# Patient Record
Sex: Male | Born: 1935 | Race: White | Hispanic: No | Marital: Married | Smoking: Former smoker
Health system: Southern US, Community
[De-identification: ages and names within clinical notes are randomized; demographics above are authoritative.]

## PROBLEM LIST (undated history)

## (undated) DIAGNOSIS — I2510 Atherosclerotic heart disease of native coronary artery without angina pectoris: Secondary | ICD-10-CM

## (undated) DIAGNOSIS — K649 Unspecified hemorrhoids: Secondary | ICD-10-CM

## (undated) DIAGNOSIS — Z789 Other specified health status: Secondary | ICD-10-CM

## (undated) DIAGNOSIS — I509 Heart failure, unspecified: Secondary | ICD-10-CM

## (undated) DIAGNOSIS — E785 Hyperlipidemia, unspecified: Secondary | ICD-10-CM

## (undated) DIAGNOSIS — H9193 Unspecified hearing loss, bilateral: Secondary | ICD-10-CM

## (undated) DIAGNOSIS — I10 Essential (primary) hypertension: Secondary | ICD-10-CM

## (undated) DIAGNOSIS — R0602 Shortness of breath: Secondary | ICD-10-CM

## (undated) DIAGNOSIS — N200 Calculus of kidney: Secondary | ICD-10-CM

## (undated) DIAGNOSIS — J449 Chronic obstructive pulmonary disease, unspecified: Secondary | ICD-10-CM

## (undated) HISTORY — DX: Chronic obstructive pulmonary disease, unspecified: J44.9

## (undated) HISTORY — PX: TONSILLECTOMY: SUR1361

## (undated) HISTORY — DX: Unspecified hemorrhoids: K64.9

## (undated) HISTORY — DX: Calculus of kidney: N20.0

## (undated) HISTORY — DX: Unspecified hearing loss, bilateral: H91.93

## (undated) HISTORY — DX: Essential (primary) hypertension: I10

## (undated) HISTORY — DX: Atherosclerotic heart disease of native coronary artery without angina pectoris: I25.10

## (undated) HISTORY — PX: CHOLECYSTECTOMY: SHX55

## (undated) HISTORY — DX: Shortness of breath: R06.02

## (undated) HISTORY — DX: Other specified health status: Z78.9

## (undated) HISTORY — DX: Hyperlipidemia, unspecified: E78.5

---

## 2007-03-09 ENCOUNTER — Ambulatory Visit: Payer: Self-pay | Admitting: Cardiology

## 2010-11-25 ENCOUNTER — Ambulatory Visit: Payer: Self-pay | Admitting: Urology

## 2010-11-25 IMAGING — CT CT ABD-PELV W/O CM
1 of 2 series · 14 of 32 positions shown, 18 images · non-contrast
Comparison: none

REASON FOR EXAM: hematuria
COMMENTS:

[Series 2: soft tissue · axial · 0.80mm/px · z∈[-504,-150]mm · 14 of 135 slices shown, 18 images]
[im 11/135  soft-tissue]
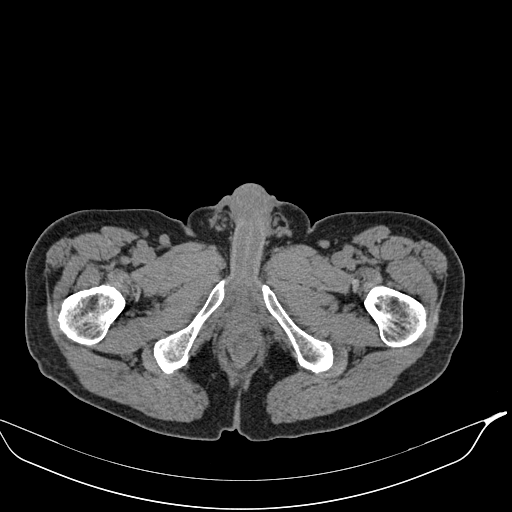
[im 11/135  bone]
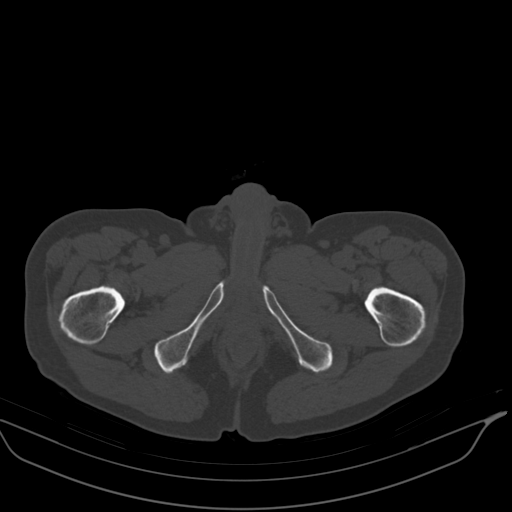
[im 22/135  soft-tissue]
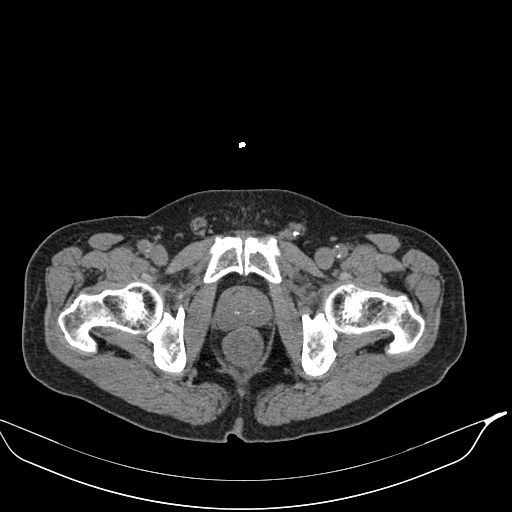
[im 33/135  soft-tissue]
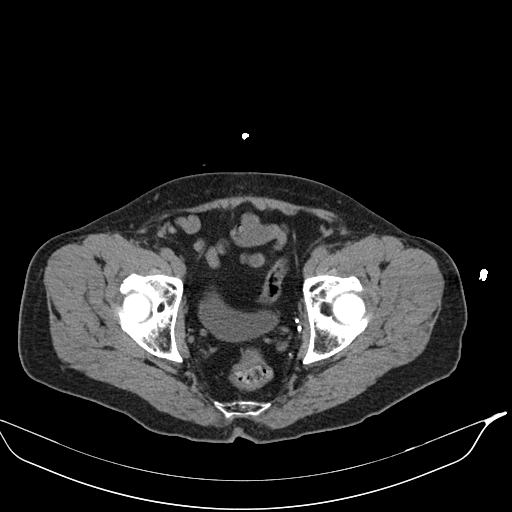
[im 43/135  soft-tissue]
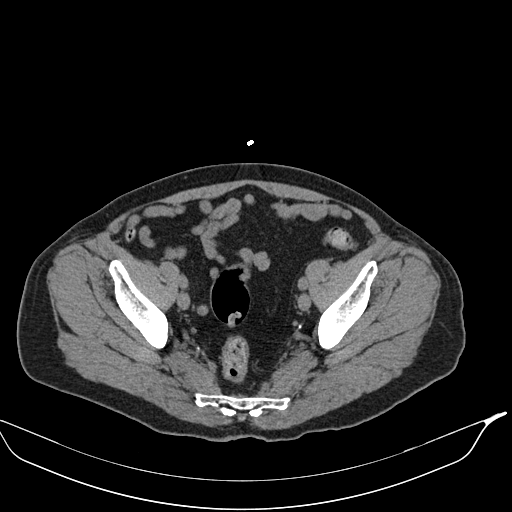
[im 54/135  soft-tissue]
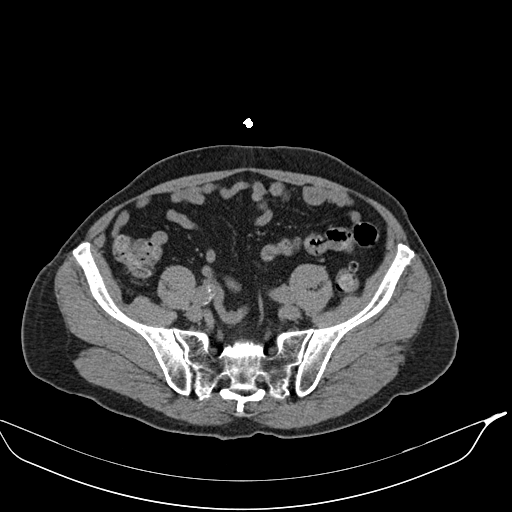
[im 65/135  soft-tissue]
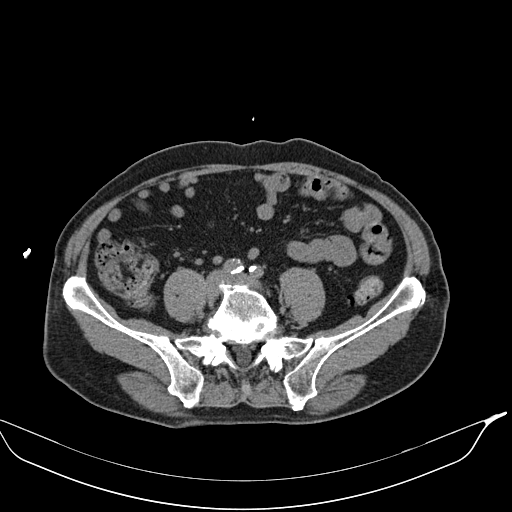
[im 76/135  soft-tissue]
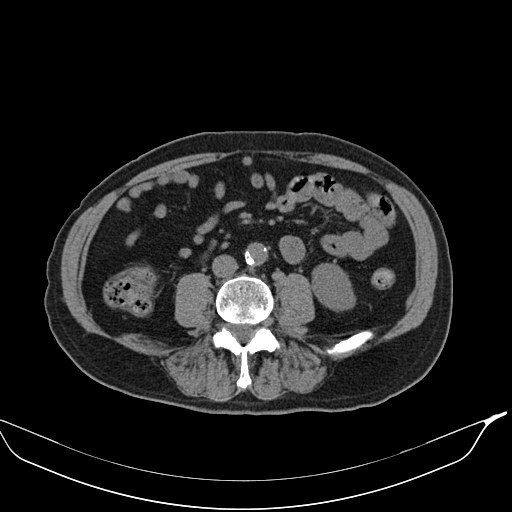
[im 86/135  soft-tissue]
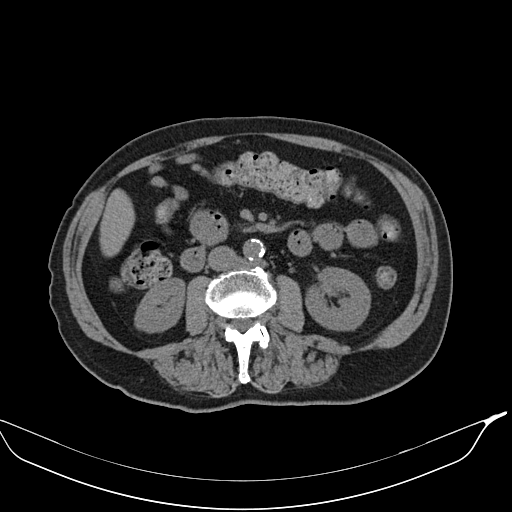
[im 97/135  soft-tissue]
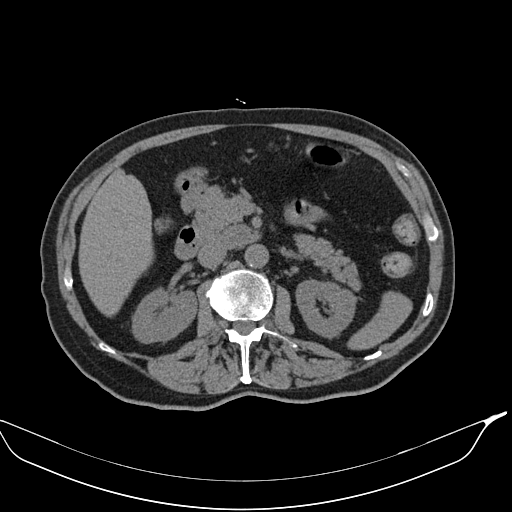
[im 97/135  bone]
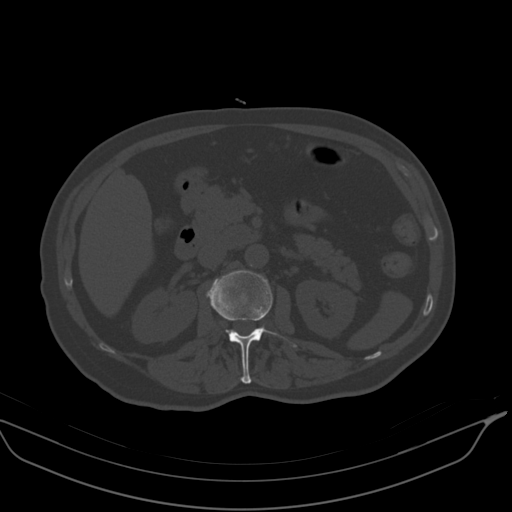
[im 108/135  soft-tissue]
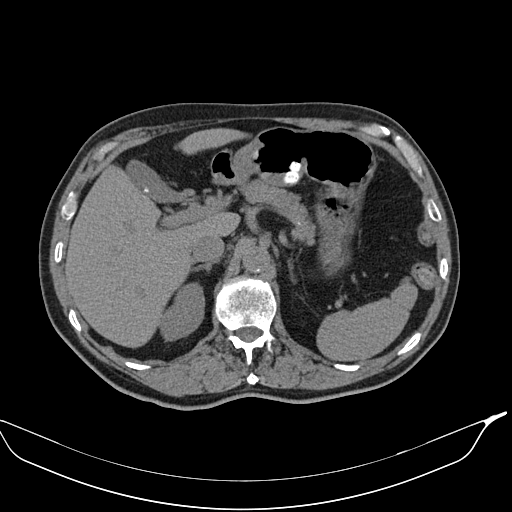
[im 113/135  lung]
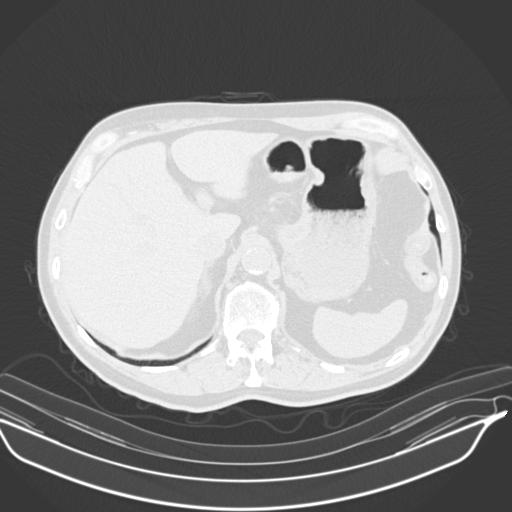
[im 118/135  soft-tissue]
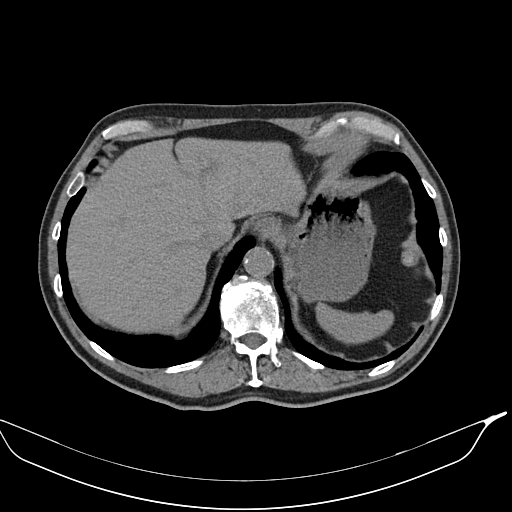
[im 118/135  lung]
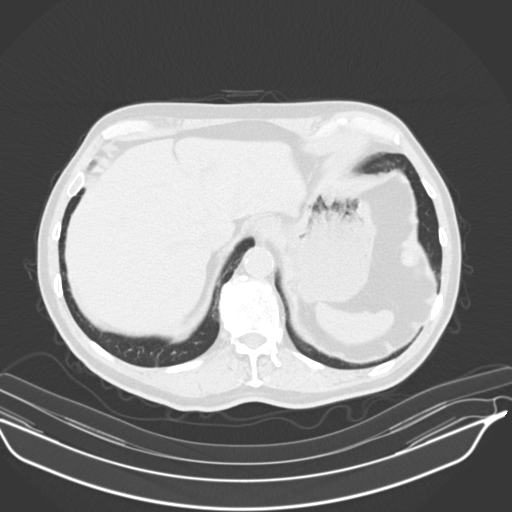
[im 124/135  lung]
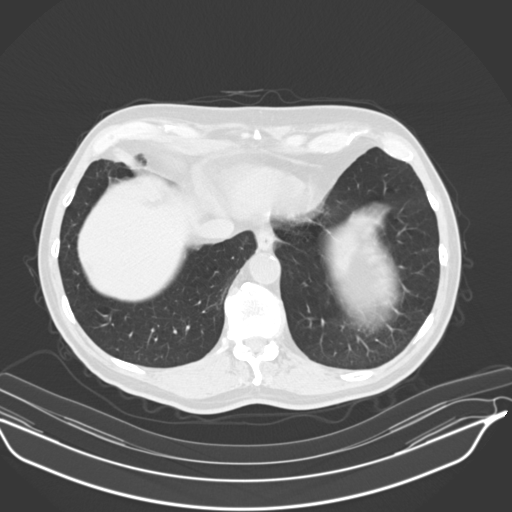
[im 129/135  soft-tissue]
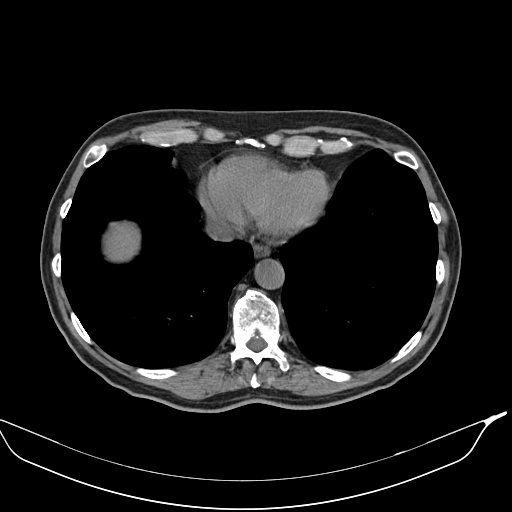
[im 129/135  lung]
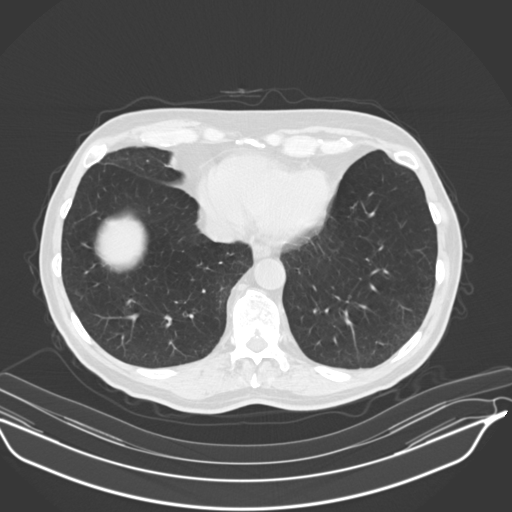

[14 of 32 positions shown; findings below may reference images not displayed]

PROCEDURE:     FALLON - FALLON ABDOMEN AND PELVIS WO  - [DATE] [DATE]

RESULT:     Axial noncontrast CT scanning was performed through the abdomen
and pelvis at 3 mm intervals and slice thicknesses. Review of multiplanar
reconstructed images was performed separately on the VIA monitor.

The kidneys are normal in density and contour. There are no calcified stones
nor is there any evidence of obstruction. The perinephric fat is normal in
density. Along the expected course of the ureters I do not see abnormal
calcifications. The partially distended urinary bladder is normal in
appearance. The prostate gland is mildly enlarged. The seminal vesicles
exhibit no acute abnormality. There are phleboliths within the pelvis. I see
no pelvic nor inguinal lymphadenopathy. There are small bilateral inguinal
hernias containing only fat.

The unopacified loops of small and large bowel exhibit no evidence of
obstruction nor ileus nor acute inflammation. There are a few scattered
diverticula especially in the descending colon. The gallbladder exhibits
multiple calcified stones layering in the dependent portion. The liver,
spleen, pancreas, partially distended stomach, and adrenal glands are normal
in appearance. There is a retroaortic left renal vein. The caliber of the
abdominal aorta is normal. The lung bases exhibit emphysematous changes. The
lumbar vertebral bodies are preserved in height. There are mild degenerative
changes of the disc in the lower thoracic and lumbar spine.
IMPRESSION: 1. I do not see evidence of calcified urinary tract stones nor evidence of
obstruction, masslike lesions, or perinephric inflammation. I see no
abnormalities along the expected course of the ureters nor abnormalities
associated with the urinary bladder.
2. The prostate gland is mildly enlarged and produces a mild impression upon
the urinary bladder. No acute abnormality the prostate or seminal vesicles
is seen.
3. There are small bilateral fat-containing inguinal hernias.
4. There are gallstones.
5. There are a few scattered diverticula within the colon but I see no
evidence of acute diverticulitis. The appendix is normal in appearance.

## 2011-05-15 ENCOUNTER — Emergency Department: Payer: Self-pay | Admitting: Internal Medicine

## 2011-05-15 IMAGING — CT CT STONE STUDY
1 of 2 series · 15 of 32 positions shown, 19 images · non-contrast
Comparison: none

REASON FOR EXAM: R flank, back pain
COMMENTS:

[Series 2: stone · axial · 0.73mm/px · z∈[-843,-483]mm · 15 of 137 slices shown, 19 images]
[im 11/137  soft-tissue]
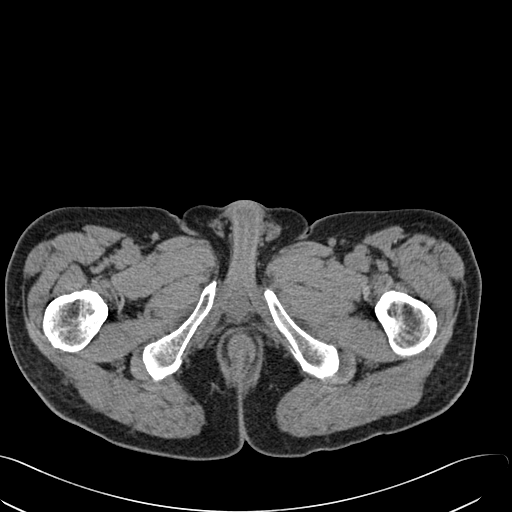
[im 11/137  bone]
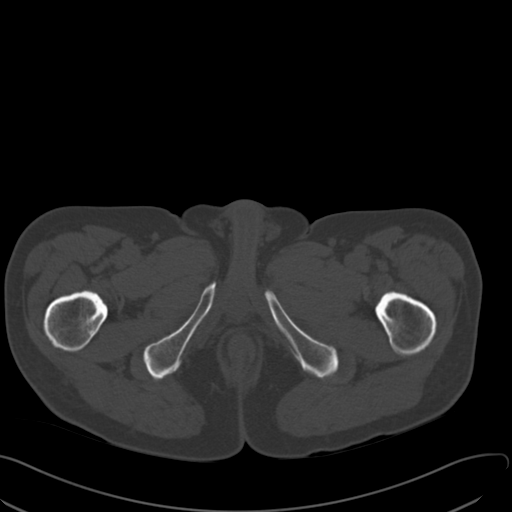
[im 21/137  soft-tissue]
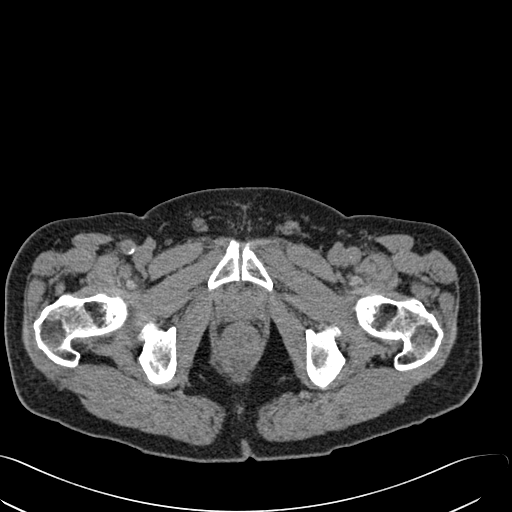
[im 31/137  soft-tissue]
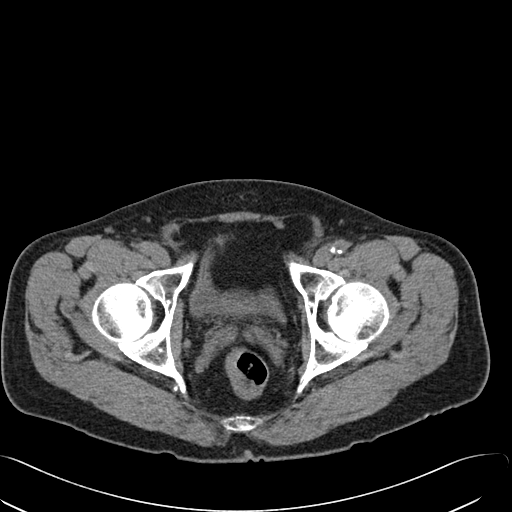
[im 41/137  soft-tissue]
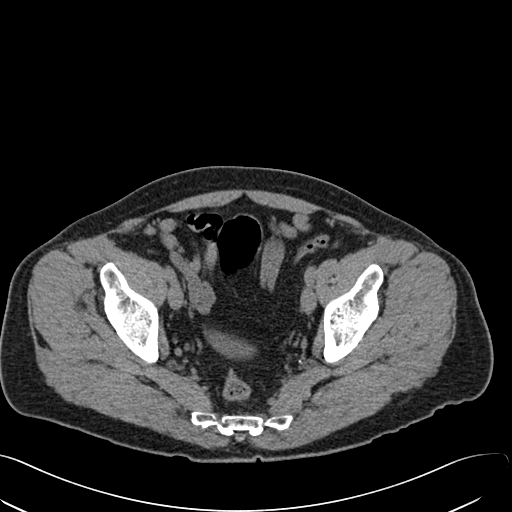
[im 51/137  soft-tissue]
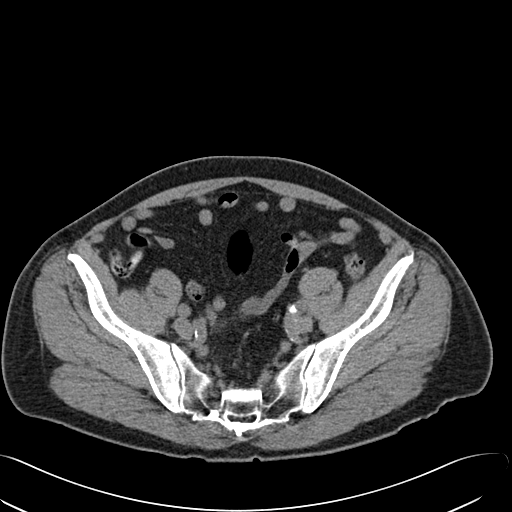
[im 61/137  soft-tissue]
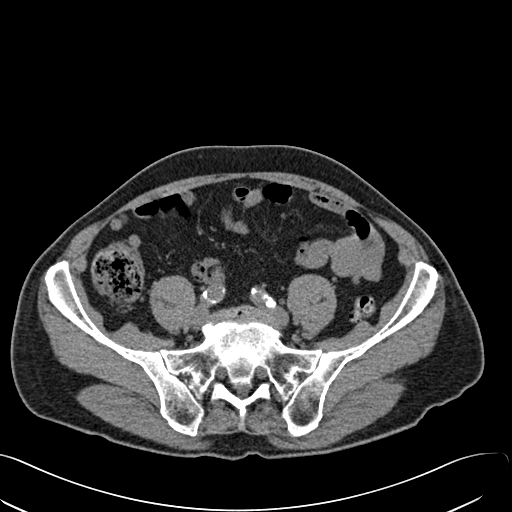
[im 71/137  soft-tissue]
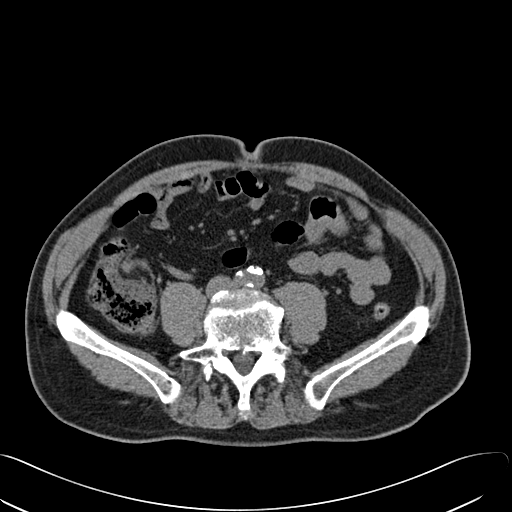
[im 81/137  soft-tissue]
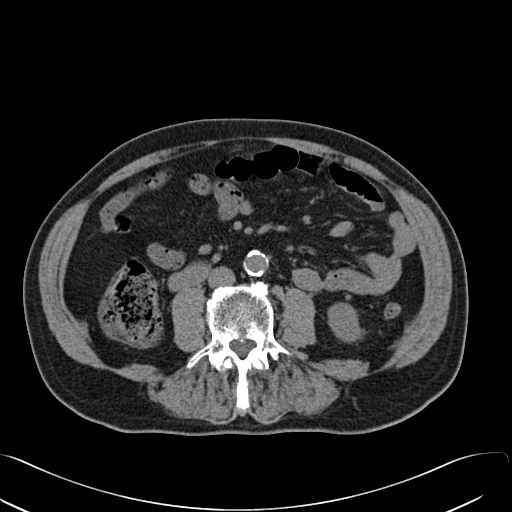
[im 91/137  soft-tissue]
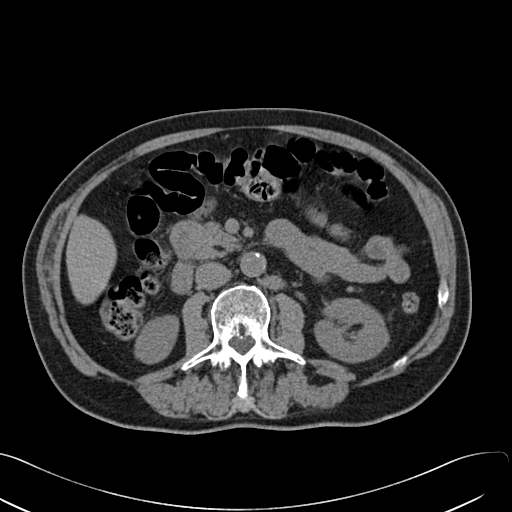
[im 91/137  bone]
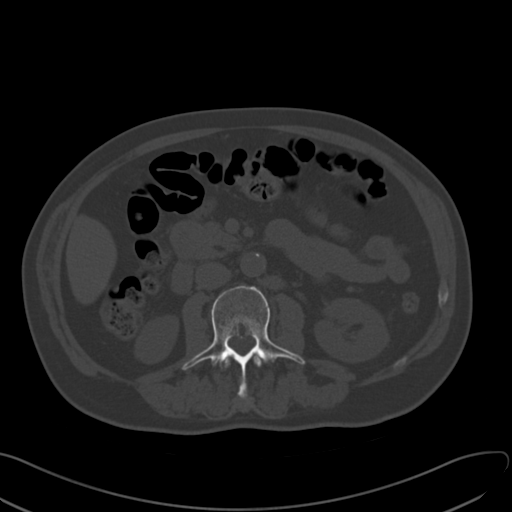
[im 101/137  soft-tissue]
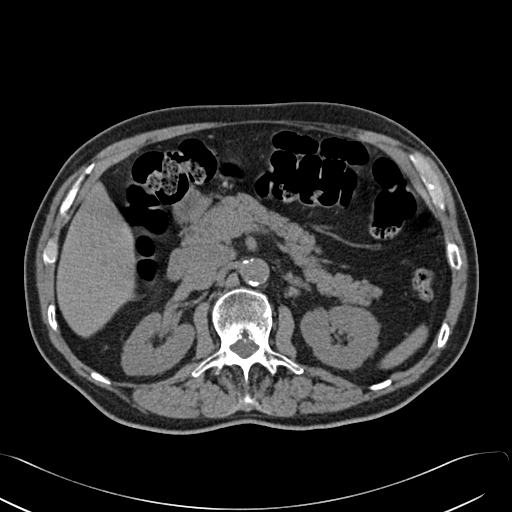
[im 111/137  soft-tissue]
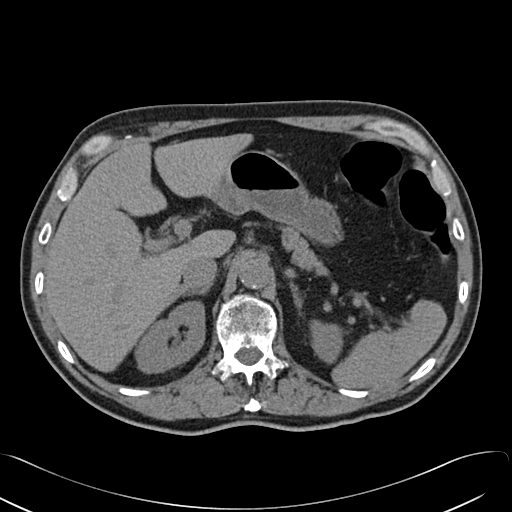
[im 116/137  lung]
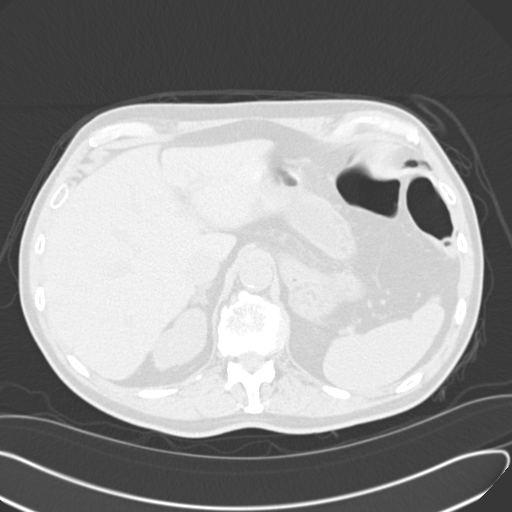
[im 121/137  soft-tissue]
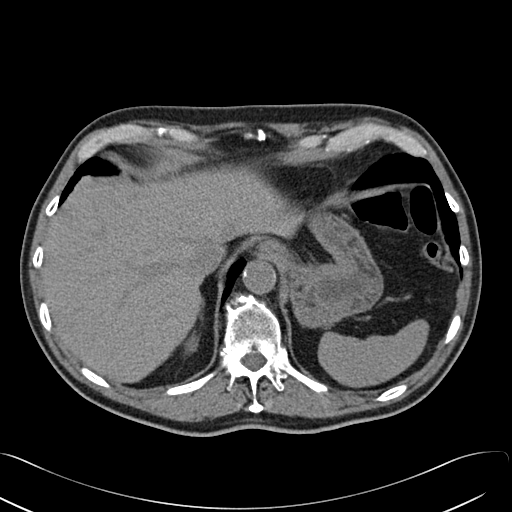
[im 121/137  lung]
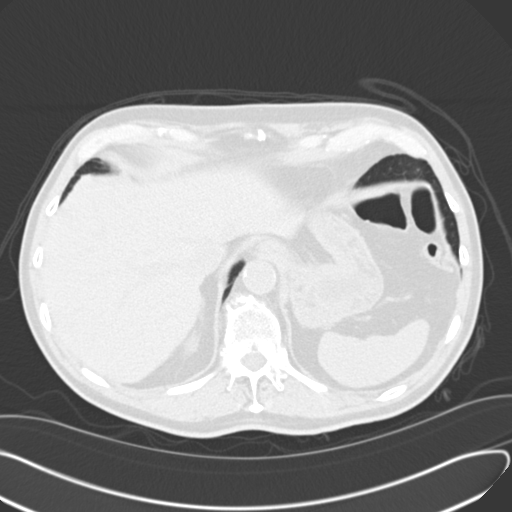
[im 126/137  lung]
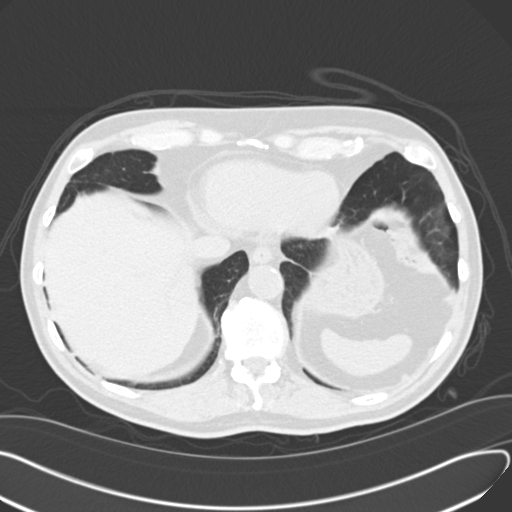
[im 131/137  soft-tissue]
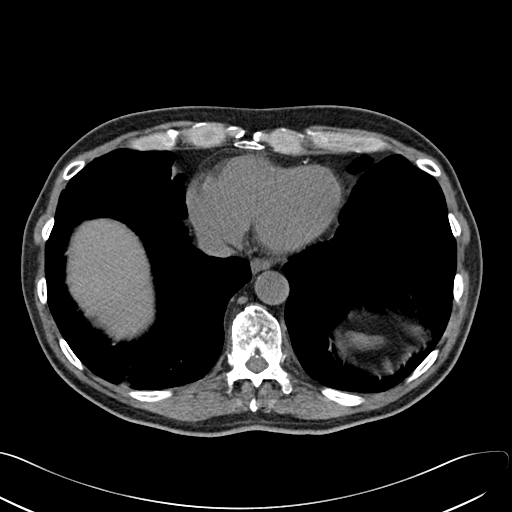
[im 131/137  lung]
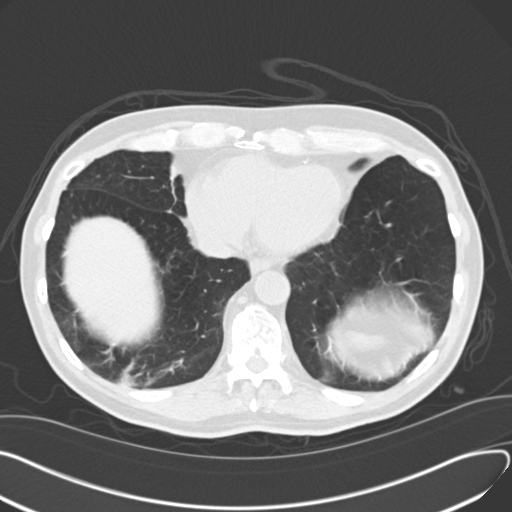

[15 of 32 positions shown; findings below may reference images not displayed]

PROCEDURE:     CT  - CT ABDOMEN /PELVIS WO (STONE)  - [DATE] [DATE]

RESULT:     Emergent noncontrast CT of the abdomen and pelvis is performed.
Comparison is made to the previous study of [DATE]. Images are
reconstructed at 3.0 mm slice thickness in the axial plane. Multislice
helical acquisition is performed utilizing the [employer] Via software for thin
slice reconstructions in the axial, coronal and sagittal planes.

No nephrolithiasis or hydronephrosis is evident area distal colonic
diverticulosis is seen especially in the distal descending colon.
Cholelithiasis is evident. Atherosclerotic calcification is demonstrated. No
nephrolithiasis is evident. Phleboliths are present within the pelvis. The
prostate is mildly enlarged but unchanged. The urinary bladder is
decompressed. The appendix is normal in appearance. The abdominal wall
appears intact. The spleen, liver, adrenal glands and pancreas appear
unremarkable. The lung bases demonstrate emphysematous disease with minimal
fibrosis or atelectasis. No mass or infiltrate is evident.
IMPRESSION: 1. Cholelithiasis. No definite CT evidence of acute cholecystitis.
2. Normal-appearing appendix.
3. No evidence of hydronephrosis, hydroureter or nephrolithiasis. No acute
inflammation. Diverticulosis is present. There is no abnormal bowel
distention or wall thickening.

## 2011-06-26 ENCOUNTER — Ambulatory Visit: Payer: Self-pay | Admitting: Surgery

## 2012-07-08 ENCOUNTER — Ambulatory Visit: Payer: Self-pay | Admitting: Unknown Physician Specialty

## 2012-08-17 ENCOUNTER — Ambulatory Visit: Payer: Self-pay | Admitting: Ophthalmology

## 2012-09-29 ENCOUNTER — Ambulatory Visit: Payer: Self-pay | Admitting: Ophthalmology

## 2014-02-24 DIAGNOSIS — K649 Unspecified hemorrhoids: Secondary | ICD-10-CM

## 2014-02-24 DIAGNOSIS — I10 Essential (primary) hypertension: Secondary | ICD-10-CM

## 2014-02-24 DIAGNOSIS — E785 Hyperlipidemia, unspecified: Secondary | ICD-10-CM

## 2014-02-24 DIAGNOSIS — I2510 Atherosclerotic heart disease of native coronary artery without angina pectoris: Secondary | ICD-10-CM

## 2014-02-24 HISTORY — DX: Atherosclerotic heart disease of native coronary artery without angina pectoris: I25.10

## 2014-02-24 HISTORY — DX: Hyperlipidemia, unspecified: E78.5

## 2014-02-24 HISTORY — DX: Essential (primary) hypertension: I10

## 2014-02-24 HISTORY — DX: Unspecified hemorrhoids: K64.9

## 2015-03-27 DIAGNOSIS — R0602 Shortness of breath: Secondary | ICD-10-CM

## 2015-03-27 HISTORY — DX: Shortness of breath: R06.02

## 2015-03-30 DIAGNOSIS — Z789 Other specified health status: Secondary | ICD-10-CM

## 2015-03-30 HISTORY — DX: Other specified health status: Z78.9

## 2015-08-29 DIAGNOSIS — J449 Chronic obstructive pulmonary disease, unspecified: Secondary | ICD-10-CM

## 2015-08-29 HISTORY — DX: Chronic obstructive pulmonary disease, unspecified: J44.9

## 2015-09-25 DIAGNOSIS — I10 Essential (primary) hypertension: Secondary | ICD-10-CM | POA: Diagnosis not present

## 2015-09-25 DIAGNOSIS — Z789 Other specified health status: Secondary | ICD-10-CM | POA: Diagnosis not present

## 2015-09-25 DIAGNOSIS — I2510 Atherosclerotic heart disease of native coronary artery without angina pectoris: Secondary | ICD-10-CM | POA: Diagnosis not present

## 2015-09-25 DIAGNOSIS — R0602 Shortness of breath: Secondary | ICD-10-CM | POA: Diagnosis not present

## 2015-09-25 DIAGNOSIS — E7800 Pure hypercholesterolemia, unspecified: Secondary | ICD-10-CM | POA: Diagnosis not present

## 2015-09-25 DIAGNOSIS — J449 Chronic obstructive pulmonary disease, unspecified: Secondary | ICD-10-CM | POA: Diagnosis not present

## 2015-11-02 DIAGNOSIS — J310 Chronic rhinitis: Secondary | ICD-10-CM | POA: Diagnosis not present

## 2015-11-02 DIAGNOSIS — H903 Sensorineural hearing loss, bilateral: Secondary | ICD-10-CM | POA: Diagnosis not present

## 2015-11-02 DIAGNOSIS — H6060 Unspecified chronic otitis externa, unspecified ear: Secondary | ICD-10-CM | POA: Diagnosis not present

## 2015-11-05 DIAGNOSIS — H26493 Other secondary cataract, bilateral: Secondary | ICD-10-CM | POA: Diagnosis not present

## 2015-11-29 DIAGNOSIS — H26491 Other secondary cataract, right eye: Secondary | ICD-10-CM | POA: Diagnosis not present

## 2015-12-13 DIAGNOSIS — H26492 Other secondary cataract, left eye: Secondary | ICD-10-CM | POA: Diagnosis not present

## 2016-01-01 DIAGNOSIS — J449 Chronic obstructive pulmonary disease, unspecified: Secondary | ICD-10-CM | POA: Diagnosis not present

## 2016-02-25 DIAGNOSIS — I10 Essential (primary) hypertension: Secondary | ICD-10-CM | POA: Diagnosis not present

## 2016-02-25 DIAGNOSIS — H9193 Unspecified hearing loss, bilateral: Secondary | ICD-10-CM | POA: Diagnosis not present

## 2016-02-25 DIAGNOSIS — I2510 Atherosclerotic heart disease of native coronary artery without angina pectoris: Secondary | ICD-10-CM | POA: Diagnosis not present

## 2016-02-25 DIAGNOSIS — E7800 Pure hypercholesterolemia, unspecified: Secondary | ICD-10-CM | POA: Diagnosis not present

## 2016-02-25 DIAGNOSIS — J449 Chronic obstructive pulmonary disease, unspecified: Secondary | ICD-10-CM | POA: Diagnosis not present

## 2016-03-03 DIAGNOSIS — J449 Chronic obstructive pulmonary disease, unspecified: Secondary | ICD-10-CM | POA: Diagnosis not present

## 2016-03-03 DIAGNOSIS — H918X3 Other specified hearing loss, bilateral: Secondary | ICD-10-CM | POA: Diagnosis not present

## 2016-03-03 DIAGNOSIS — I2510 Atherosclerotic heart disease of native coronary artery without angina pectoris: Secondary | ICD-10-CM | POA: Diagnosis not present

## 2016-03-03 DIAGNOSIS — Z0000 Encounter for general adult medical examination without abnormal findings: Secondary | ICD-10-CM | POA: Diagnosis not present

## 2016-03-03 DIAGNOSIS — E782 Mixed hyperlipidemia: Secondary | ICD-10-CM | POA: Diagnosis not present

## 2016-03-03 DIAGNOSIS — I10 Essential (primary) hypertension: Secondary | ICD-10-CM | POA: Diagnosis not present

## 2016-03-03 DIAGNOSIS — Z23 Encounter for immunization: Secondary | ICD-10-CM | POA: Diagnosis not present

## 2016-03-03 DIAGNOSIS — H9193 Unspecified hearing loss, bilateral: Secondary | ICD-10-CM

## 2016-03-03 HISTORY — DX: Unspecified hearing loss, bilateral: H91.93

## 2016-05-28 DIAGNOSIS — Z08 Encounter for follow-up examination after completed treatment for malignant neoplasm: Secondary | ICD-10-CM | POA: Diagnosis not present

## 2016-05-28 DIAGNOSIS — C44619 Basal cell carcinoma of skin of left upper limb, including shoulder: Secondary | ICD-10-CM | POA: Diagnosis not present

## 2016-05-28 DIAGNOSIS — L570 Actinic keratosis: Secondary | ICD-10-CM | POA: Diagnosis not present

## 2016-05-28 DIAGNOSIS — Z85828 Personal history of other malignant neoplasm of skin: Secondary | ICD-10-CM | POA: Diagnosis not present

## 2016-05-28 DIAGNOSIS — L821 Other seborrheic keratosis: Secondary | ICD-10-CM | POA: Diagnosis not present

## 2016-05-28 DIAGNOSIS — Z85820 Personal history of malignant melanoma of skin: Secondary | ICD-10-CM | POA: Diagnosis not present

## 2016-05-28 DIAGNOSIS — X32XXXA Exposure to sunlight, initial encounter: Secondary | ICD-10-CM | POA: Diagnosis not present

## 2016-05-28 DIAGNOSIS — D485 Neoplasm of uncertain behavior of skin: Secondary | ICD-10-CM | POA: Diagnosis not present

## 2016-07-09 DIAGNOSIS — C44619 Basal cell carcinoma of skin of left upper limb, including shoulder: Secondary | ICD-10-CM | POA: Diagnosis not present

## 2016-07-09 DIAGNOSIS — L905 Scar conditions and fibrosis of skin: Secondary | ICD-10-CM | POA: Diagnosis not present

## 2016-08-26 DIAGNOSIS — E782 Mixed hyperlipidemia: Secondary | ICD-10-CM | POA: Diagnosis not present

## 2016-08-26 DIAGNOSIS — I10 Essential (primary) hypertension: Secondary | ICD-10-CM | POA: Diagnosis not present

## 2016-08-26 DIAGNOSIS — J449 Chronic obstructive pulmonary disease, unspecified: Secondary | ICD-10-CM | POA: Diagnosis not present

## 2016-08-26 DIAGNOSIS — Z0000 Encounter for general adult medical examination without abnormal findings: Secondary | ICD-10-CM | POA: Diagnosis not present

## 2016-08-26 DIAGNOSIS — I2510 Atherosclerotic heart disease of native coronary artery without angina pectoris: Secondary | ICD-10-CM | POA: Diagnosis not present

## 2016-09-02 DIAGNOSIS — R6889 Other general symptoms and signs: Secondary | ICD-10-CM | POA: Diagnosis not present

## 2016-09-02 DIAGNOSIS — Z789 Other specified health status: Secondary | ICD-10-CM | POA: Diagnosis not present

## 2016-09-02 DIAGNOSIS — I10 Essential (primary) hypertension: Secondary | ICD-10-CM | POA: Diagnosis not present

## 2016-09-02 DIAGNOSIS — E7800 Pure hypercholesterolemia, unspecified: Secondary | ICD-10-CM | POA: Diagnosis not present

## 2016-09-02 DIAGNOSIS — J449 Chronic obstructive pulmonary disease, unspecified: Secondary | ICD-10-CM | POA: Diagnosis not present

## 2016-09-02 DIAGNOSIS — Z23 Encounter for immunization: Secondary | ICD-10-CM | POA: Diagnosis not present

## 2016-09-02 DIAGNOSIS — Z0000 Encounter for general adult medical examination without abnormal findings: Secondary | ICD-10-CM | POA: Diagnosis not present

## 2016-09-02 DIAGNOSIS — I2510 Atherosclerotic heart disease of native coronary artery without angina pectoris: Secondary | ICD-10-CM | POA: Diagnosis not present

## 2016-09-02 DIAGNOSIS — R0602 Shortness of breath: Secondary | ICD-10-CM | POA: Diagnosis not present

## 2016-10-21 DIAGNOSIS — X32XXXA Exposure to sunlight, initial encounter: Secondary | ICD-10-CM | POA: Diagnosis not present

## 2016-10-21 DIAGNOSIS — Z08 Encounter for follow-up examination after completed treatment for malignant neoplasm: Secondary | ICD-10-CM | POA: Diagnosis not present

## 2016-10-21 DIAGNOSIS — Z85828 Personal history of other malignant neoplasm of skin: Secondary | ICD-10-CM | POA: Diagnosis not present

## 2016-10-21 DIAGNOSIS — L570 Actinic keratosis: Secondary | ICD-10-CM | POA: Diagnosis not present

## 2016-10-21 DIAGNOSIS — C4441 Basal cell carcinoma of skin of scalp and neck: Secondary | ICD-10-CM | POA: Diagnosis not present

## 2016-10-21 DIAGNOSIS — D0439 Carcinoma in situ of skin of other parts of face: Secondary | ICD-10-CM | POA: Diagnosis not present

## 2016-10-21 DIAGNOSIS — D485 Neoplasm of uncertain behavior of skin: Secondary | ICD-10-CM | POA: Diagnosis not present

## 2016-10-21 DIAGNOSIS — Z85820 Personal history of malignant melanoma of skin: Secondary | ICD-10-CM | POA: Diagnosis not present

## 2016-11-03 DIAGNOSIS — R309 Painful micturition, unspecified: Secondary | ICD-10-CM | POA: Diagnosis not present

## 2016-11-10 ENCOUNTER — Ambulatory Visit: Payer: PPO | Admitting: Urology

## 2016-11-10 ENCOUNTER — Encounter: Payer: Self-pay | Admitting: Urology

## 2016-11-10 VITALS — BP 103/62 | HR 84 | Ht 66.0 in | Wt 143.0 lb

## 2016-11-10 DIAGNOSIS — R3129 Other microscopic hematuria: Secondary | ICD-10-CM

## 2016-11-10 LAB — URINALYSIS, COMPLETE
Bilirubin, UA: NEGATIVE
GLUCOSE, UA: NEGATIVE
KETONES UA: NEGATIVE
LEUKOCYTES UA: NEGATIVE
Nitrite, UA: NEGATIVE
Protein, UA: NEGATIVE
RBC, UA: NEGATIVE
SPEC GRAV UA: 1.02 (ref 1.005–1.030)
Urobilinogen, Ur: 0.2 mg/dL (ref 0.2–1.0)
pH, UA: 6.5 (ref 5.0–7.5)

## 2016-11-10 LAB — MICROSCOPIC EXAMINATION
BACTERIA UA: NONE SEEN
RBC MICROSCOPIC, UA: NONE SEEN /HPF (ref 0–?)

## 2016-11-10 LAB — BLADDER SCAN AMB NON-IMAGING

## 2016-11-10 NOTE — Progress Notes (Signed)
11/10/2016 9:25 AM   Eric Li [birth year] [id number]  Referring provider: [name], MD [address] [facility] [place], [place] [address]  Chief Complaint  Patient presents with  . Hematuria    New Patient    HPI: 82 year old male who presents today for further evaluation of microscopic hematuria. The patient states that over the last several weeks he had noticed increasing or worsening urinary frequency. He thus subsequently was followed by dysuria. The patient then was placed on antibiotics for 7 days and his symptoms have completely resolved at this point.  The patient no longer is complaining of any urinary frequency, urgency, or dysuria. He has not had any gross hematuria. He denies any pain.  The patient does have a history of kidney stones, has not passed the stone in quite some time. He was last treated by Dr. [name].     PMH: Past Medical History:  Diagnosis Date  . Bilateral hearing loss 03/03/2016  . CAD (coronary artery disease) 02/24/2014  . COPD, moderate ([place]) 08/29/2015  . Hemorrhoids 02/24/2014  . HTN (hypertension) 02/24/2014  . Hyperlipidemia, unspecified 02/24/2014  . Nephrolithiasis   . SOB (shortness of breath) on exertion 03/27/2015  . Statin intolerance 03/30/2015    Surgical History: Past Surgical History:  Procedure Laterality Date  . CHOLECYSTECTOMY    . TONSILLECTOMY      Home Medications:  Allergies as of 11/10/2016   No Known Allergies     Medication List       Accurate as of 11/10/16  9:25 AM. Always use your most recent med list.          albuterol 108 (90 Base) MCG/ACT inhaler Commonly known as:  PROVENTIL HFA;VENTOLIN HFA Inhale into the lungs.   aspirin EC 81 MG tablet Take by mouth.   ezetimibe 10 MG tablet Commonly known as:  ZETIA   metoprolol succinate 25 MG 24 hr tablet Commonly known as:  TOPROL-XL   SPIRIVA HANDIHALER 18 MCG inhalation capsule Generic drug:  tiotropium   zolpidem 10 MG  tablet Commonly known as:  AMBIEN       Allergies: No Known Allergies  Family History: Family History  Problem Relation Age of Onset  . Bladder Cancer Neg Hx   . Prolactinoma Neg Hx   . Prostate cancer Neg Hx   . Kidney cancer Neg Hx     Social History:  reports that he has quit smoking. He has never used smokeless tobacco. He reports that he does not drink alcohol or use drugs.  ROS: UROLOGY Frequent Urination?: No Hard to postpone urination?: No Burning/pain with urination?: No Get up at night to urinate?: No Leakage of urine?: No Urine stream starts and stops?: No Trouble starting stream?: No Do you have to strain to urinate?: No Blood in urine?: No Urinary tract infection?: No Sexually transmitted disease?: No Injury to kidneys or bladder?: No Painful intercourse?: No Weak stream?: Yes Erection problems?: No Penile pain?: No  Gastrointestinal Nausea?: No Vomiting?: No Indigestion/heartburn?: Yes Diarrhea?: Yes Constipation?: No  Constitutional Fever: No Night sweats?: Yes Weight loss?: No Fatigue?: No  Skin Skin rash/lesions?: Yes Itching?: Yes  Eyes Blurred vision?: No Double vision?: No  Ears/Nose/Throat Sore throat?: No Sinus problems?: Yes  Hematologic/Lymphatic Swollen glands?: No Easy bruising?: Yes  Cardiovascular Leg swelling?: No Chest pain?: No  Respiratory Cough?: No Shortness of breath?: No  Endocrine Excessive thirst?: No  Musculoskeletal Back pain?: No Joint pain?: No  Neurological Headaches?: Yes  Dizziness?: No  Psychologic Depression?: No Anxiety?: No  Physical Exam: BP 103/62   Pulse 84   Ht 5\' 6"  (1.676 m)   Wt 64.9 kg (143 lb)   BMI 23.08 kg/m   Constitutional:  Alert and oriented, No acute distress. HEENT: [place] AT, moist mucus membranes.  Trachea midline, no masses. Cardiovascular: No clubbing, cyanosis, or edema. Respiratory: Normal respiratory effort, no increased work of breathing. GI:  Abdomen is soft, nontender, nondistended, no abdominal masses GU: No CVA tenderness. Skin: No rashes, bruises or suspicious lesions. Lymph: No cervical or inguinal adenopathy. Neurologic: Grossly intact, no focal deficits, moving all 4 extremities. Psychiatric: Normal mood and affect.  Laboratory Data: No results found for: WBC, HGB, HCT, MCV, PLT  No results found for: CREATININE  No results found for: PSA  No results found for: TESTOSTERONE  No results found for: HGBA1C  Urinalysis No results found for: COLORURINE, APPEARANCEUR, LABSPEC, PHURINE, GLUCOSEU, HGBUR, BILIRUBINUR, KETONESUR, PROTEINUR, UROBILINOGEN, NITRITE, LEUKOCYTESUR  Pertinent Imaging: None reviewed today  Assessment & Plan:  A 82 year old male who was treated for urinary tract infection and at the time of initial diagnosis was noted to have microscopic hematuria. Since being treated his urine is cleared and his symptoms have resolved.  1. Microscopic hematuria The patient's microscopic hematuria some most certainly result of an infection. It is resolved today. The patient needs no additional workup. We did discuss urinary tract prevention strategies including increased hydration and management of his constipation. I did recommend that he start taking Miralax over-the- counter. The patient will follow up on an as-needed basis. - Urinalysis, Complete - BLADDER SCAN AMB NON-IMAGING   Return if symptoms worsen or fail to improve.  Ardis Hughs, [facility] [address], [place], [place] [address] [phone]

## 2016-12-16 DIAGNOSIS — C4441 Basal cell carcinoma of skin of scalp and neck: Secondary | ICD-10-CM | POA: Diagnosis not present

## 2016-12-16 DIAGNOSIS — L905 Scar conditions and fibrosis of skin: Secondary | ICD-10-CM | POA: Diagnosis not present

## 2016-12-16 DIAGNOSIS — C4442 Squamous cell carcinoma of skin of scalp and neck: Secondary | ICD-10-CM | POA: Diagnosis not present

## 2017-01-16 DIAGNOSIS — H43813 Vitreous degeneration, bilateral: Secondary | ICD-10-CM | POA: Diagnosis not present

## 2017-01-21 DIAGNOSIS — D0439 Carcinoma in situ of skin of other parts of face: Secondary | ICD-10-CM | POA: Diagnosis not present

## 2017-01-21 DIAGNOSIS — L905 Scar conditions and fibrosis of skin: Secondary | ICD-10-CM | POA: Diagnosis not present

## 2017-02-26 DIAGNOSIS — Z0000 Encounter for general adult medical examination without abnormal findings: Secondary | ICD-10-CM | POA: Diagnosis not present

## 2017-02-26 DIAGNOSIS — R6889 Other general symptoms and signs: Secondary | ICD-10-CM | POA: Diagnosis not present

## 2017-02-26 DIAGNOSIS — J449 Chronic obstructive pulmonary disease, unspecified: Secondary | ICD-10-CM | POA: Diagnosis not present

## 2017-02-26 DIAGNOSIS — Z23 Encounter for immunization: Secondary | ICD-10-CM | POA: Diagnosis not present

## 2017-02-26 DIAGNOSIS — I10 Essential (primary) hypertension: Secondary | ICD-10-CM | POA: Diagnosis not present

## 2017-02-26 DIAGNOSIS — Z125 Encounter for screening for malignant neoplasm of prostate: Secondary | ICD-10-CM | POA: Diagnosis not present

## 2017-02-26 DIAGNOSIS — I2510 Atherosclerotic heart disease of native coronary artery without angina pectoris: Secondary | ICD-10-CM | POA: Diagnosis not present

## 2017-02-26 DIAGNOSIS — Z789 Other specified health status: Secondary | ICD-10-CM | POA: Diagnosis not present

## 2017-03-04 DIAGNOSIS — E785 Hyperlipidemia, unspecified: Secondary | ICD-10-CM | POA: Diagnosis not present

## 2017-03-04 DIAGNOSIS — J449 Chronic obstructive pulmonary disease, unspecified: Secondary | ICD-10-CM | POA: Diagnosis not present

## 2017-03-04 DIAGNOSIS — I2510 Atherosclerotic heart disease of native coronary artery without angina pectoris: Secondary | ICD-10-CM | POA: Diagnosis not present

## 2017-03-04 DIAGNOSIS — Z789 Other specified health status: Secondary | ICD-10-CM | POA: Diagnosis not present

## 2017-03-04 DIAGNOSIS — R0602 Shortness of breath: Secondary | ICD-10-CM | POA: Diagnosis not present

## 2017-03-04 DIAGNOSIS — I10 Essential (primary) hypertension: Secondary | ICD-10-CM | POA: Diagnosis not present

## 2017-03-05 DIAGNOSIS — I10 Essential (primary) hypertension: Secondary | ICD-10-CM | POA: Diagnosis not present

## 2017-03-05 DIAGNOSIS — I2510 Atherosclerotic heart disease of native coronary artery without angina pectoris: Secondary | ICD-10-CM | POA: Diagnosis not present

## 2017-03-05 DIAGNOSIS — E785 Hyperlipidemia, unspecified: Secondary | ICD-10-CM | POA: Diagnosis not present

## 2017-03-05 DIAGNOSIS — R1313 Dysphagia, pharyngeal phase: Secondary | ICD-10-CM | POA: Diagnosis not present

## 2017-03-05 DIAGNOSIS — J449 Chronic obstructive pulmonary disease, unspecified: Secondary | ICD-10-CM | POA: Diagnosis not present

## 2017-03-05 DIAGNOSIS — H918X3 Other specified hearing loss, bilateral: Secondary | ICD-10-CM | POA: Diagnosis not present

## 2017-03-05 DIAGNOSIS — Z0000 Encounter for general adult medical examination without abnormal findings: Secondary | ICD-10-CM | POA: Diagnosis not present

## 2017-04-02 ENCOUNTER — Other Ambulatory Visit: Payer: Self-pay | Admitting: Student

## 2017-04-02 DIAGNOSIS — K648 Other hemorrhoids: Secondary | ICD-10-CM | POA: Diagnosis not present

## 2017-04-02 DIAGNOSIS — K219 Gastro-esophageal reflux disease without esophagitis: Secondary | ICD-10-CM | POA: Diagnosis not present

## 2017-04-02 DIAGNOSIS — R1310 Dysphagia, unspecified: Secondary | ICD-10-CM | POA: Diagnosis not present

## 2017-04-02 DIAGNOSIS — K9089 Other intestinal malabsorption: Secondary | ICD-10-CM | POA: Diagnosis not present

## 2017-04-07 ENCOUNTER — Ambulatory Visit: Payer: Self-pay

## 2017-04-07 DIAGNOSIS — E785 Hyperlipidemia, unspecified: Secondary | ICD-10-CM | POA: Diagnosis not present

## 2017-04-07 DIAGNOSIS — R0602 Shortness of breath: Secondary | ICD-10-CM | POA: Diagnosis not present

## 2017-04-07 DIAGNOSIS — I2510 Atherosclerotic heart disease of native coronary artery without angina pectoris: Secondary | ICD-10-CM | POA: Diagnosis not present

## 2017-04-09 ENCOUNTER — Ambulatory Visit
Admission: RE | Admit: 2017-04-09 | Discharge: 2017-04-09 | Disposition: A | Discharge status: Home / Self Care | Payer: PPO | Source: Ambulatory Visit | Attending: Student | Admitting: Student

## 2017-04-09 DIAGNOSIS — R1310 Dysphagia, unspecified: Secondary | ICD-10-CM | POA: Diagnosis not present

## 2017-04-09 DIAGNOSIS — K219 Gastro-esophageal reflux disease without esophagitis: Secondary | ICD-10-CM | POA: Insufficient documentation

## 2017-04-09 IMAGING — RF DG ESOPHAGUS
7 series · 20 of 24 positions shown · non-contrast
Comparison: None.

CLINICAL DATA: Trouble swallowing thick foods. No trouble with
liquids. Chunks feel like they get stuck. Has to cough them up and
spit them out.

EXAM:
ESOPHOGRAM / BARIUM SWALLOW / BARIUM TABLET STUDY
TECHNIQUE: Combined double contrast and single contrast examination performed
using effervescent crystals, thick barium liquid, and thin barium
liquid. The patient was observed with fluoroscopy swallowing a 13 mm
barium sulphate tablet.
FLUOROSCOPY TIME:  Fluoroscopy Time:  1 minutes
Radiation Exposure Index (if provided by the fluoroscopic device):
8.2 mGy
Number of Acquired Spot Images: 0

[Series 1: cp_standard · 0.50mm/px · 3 of 48 frames shown (1 of 7)]
[frame 8/48]
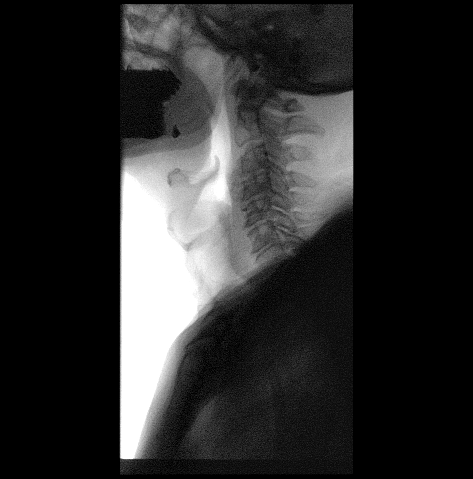
[frame 25/48]
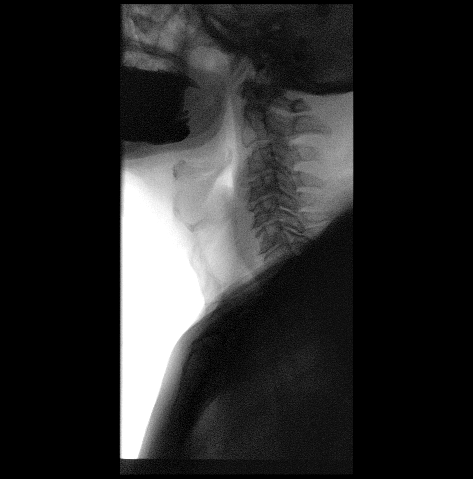
[frame 41/48]
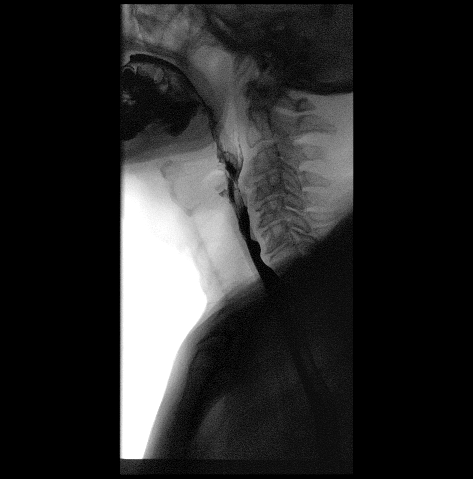

[Series 2: cp_standard · 0.51mm/px · 4 of 14 frames shown (2 of 7)]
[frame 3/14]
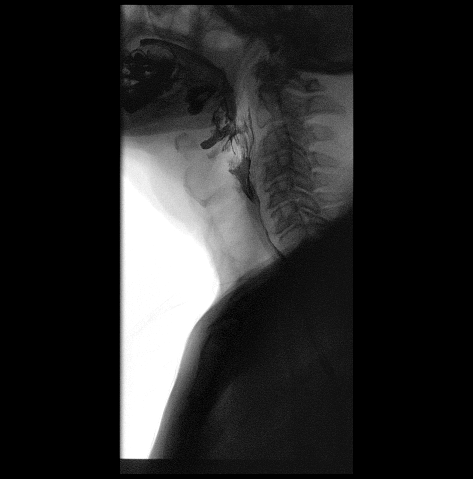
[frame 6/14]
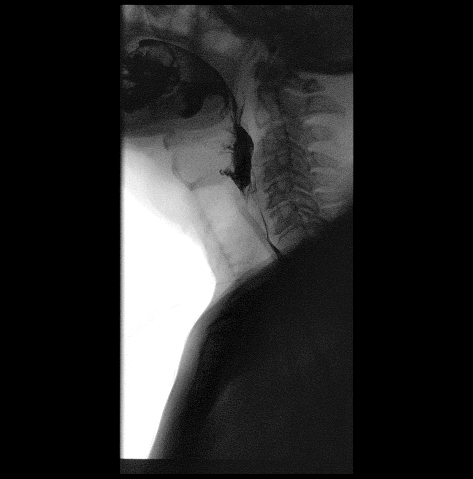
[frame 8/14]
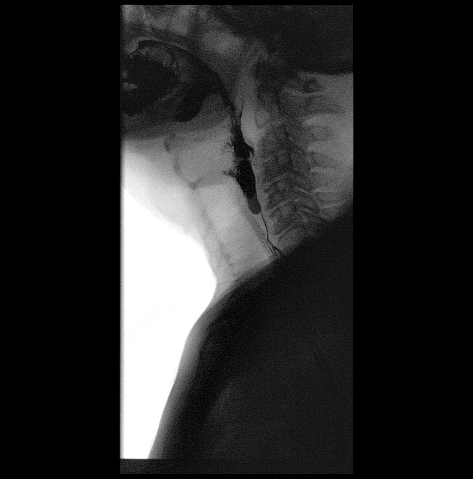
[frame 12/14]
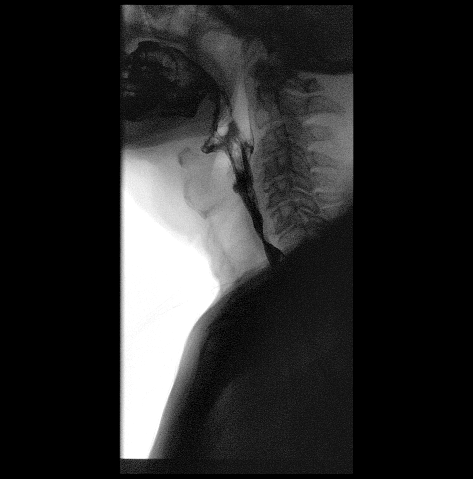

[Series 3: cp_standard · 0.51mm/px · 3 of 84 frames shown (3 of 7)]
[frame 13/84]
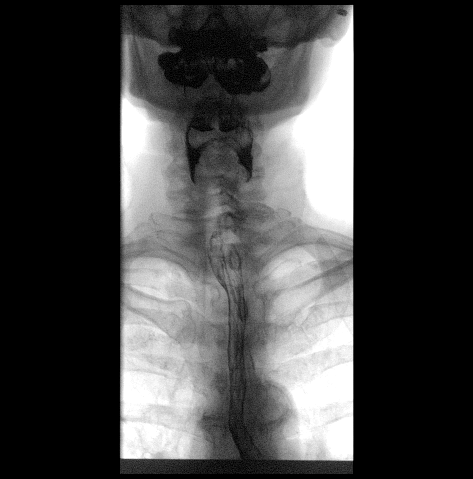
[frame 43/84]
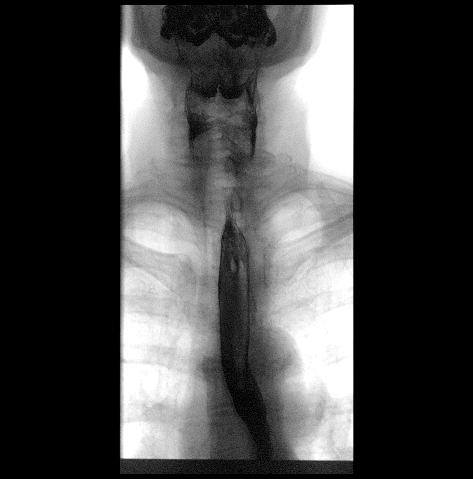
[frame 72/84]
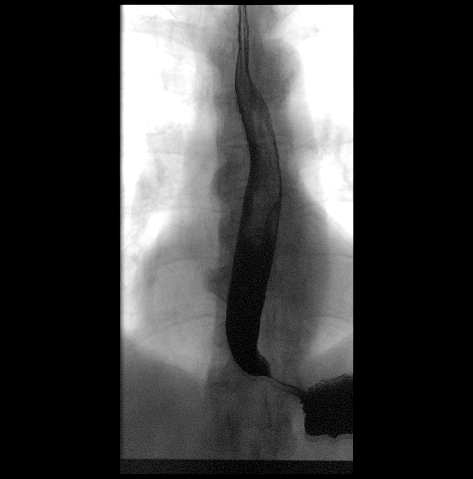

[Series 4: cp_standard · 0.51mm/px · 2 of 156 frames shown (4 of 7)]
[frame 24/156]
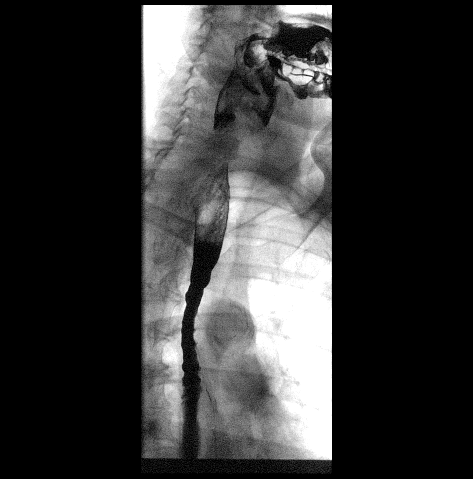
[frame 79/156]
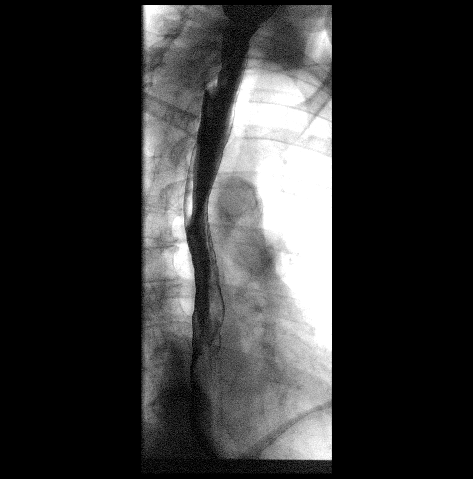

[Series 5: cp_standard · 0.51mm/px · 4 of 96 frames shown (5 of 7)]
[frame 15/96]
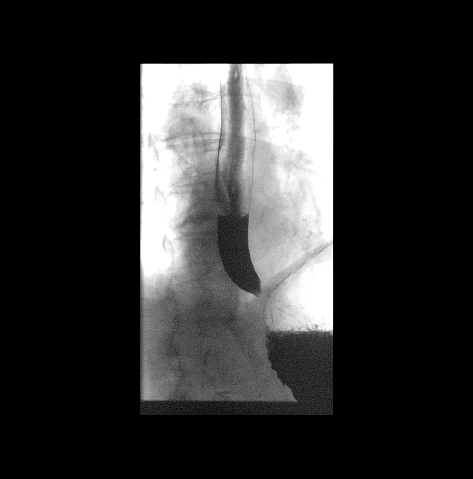
[frame 49/96]
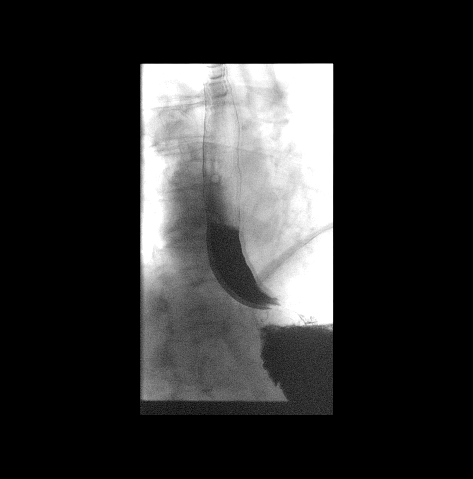
[frame 82/96]
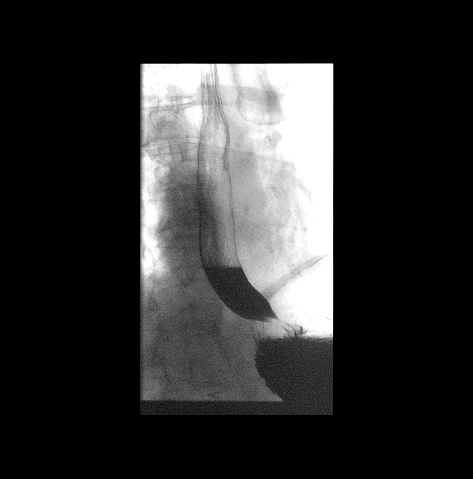
[frame 94/96]
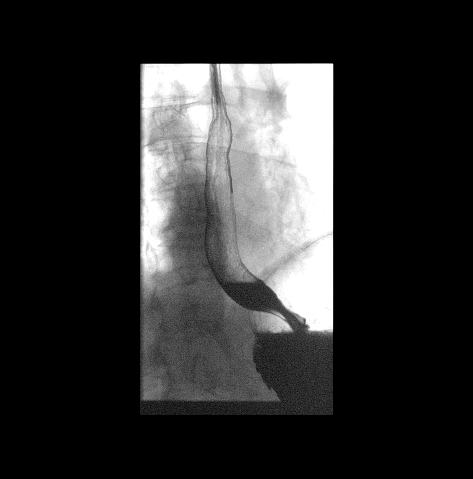

[Series 6: cp_standard · 0.55mm/px · 3 of 92 frames shown (6 of 7)]
[frame 14/92]
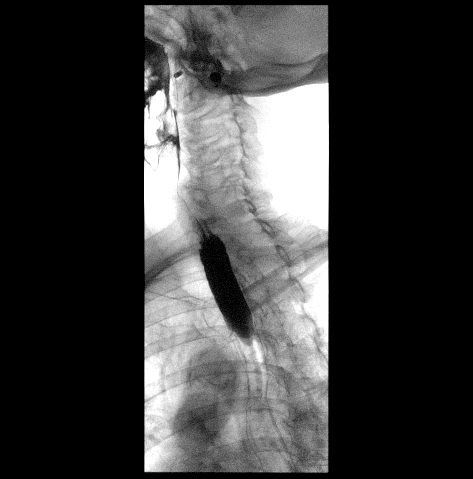
[frame 79/92]
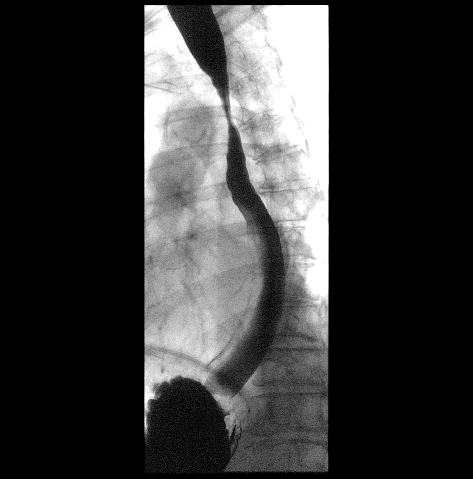
[frame 92/92]
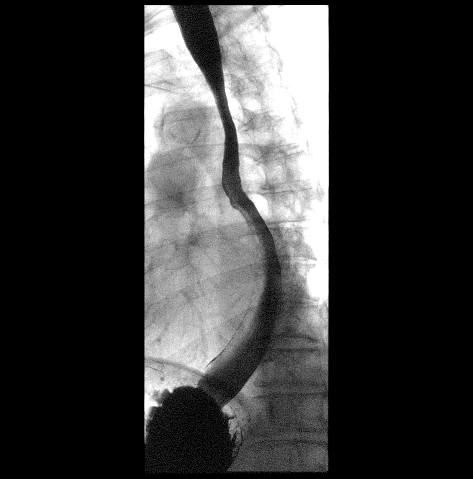

[Series 7: cp_standard · 0.27mm/px · 1 of 1 slices shown (7 of 7)]
[im 1/1]
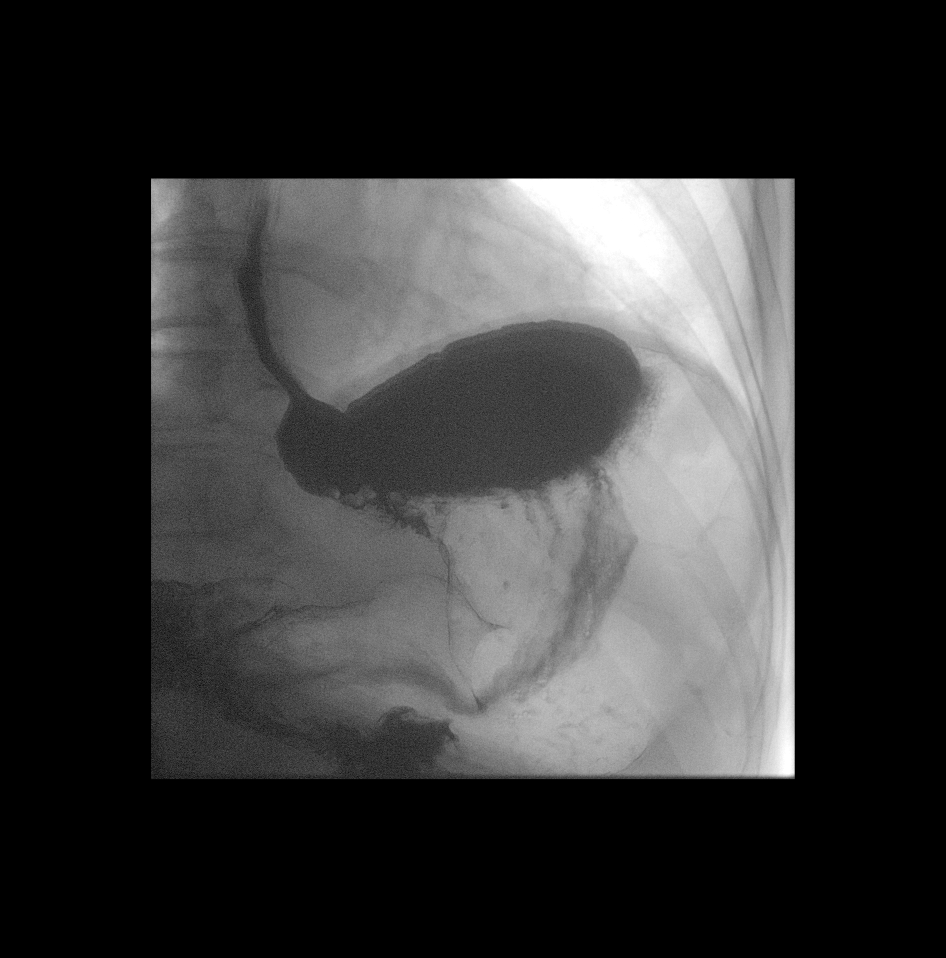

[20 of 24 positions shown; findings below may reference images not displayed]

FINDINGS: There was normal pharyngeal anatomy and motility. Contrast flowed
freely through the esophagus without evidence of stricture or mass.
There was normal esophageal mucosa without evidence of irregularity
or ulceration. Esophageal motility was normal. Mild gastroesophageal
reflux. No definite hiatal hernia was demonstrated.

At the end of the examination a 13 mm barium tablet was administered
which transited through the esophagus and esophagogastric junction
without delay.
IMPRESSION: 1. Mild gastroesophageal reflux.
2. No esophageal stricture.

## 2017-04-10 DIAGNOSIS — I10 Essential (primary) hypertension: Secondary | ICD-10-CM | POA: Diagnosis not present

## 2017-04-10 DIAGNOSIS — R0602 Shortness of breath: Secondary | ICD-10-CM | POA: Diagnosis not present

## 2017-04-10 DIAGNOSIS — Z789 Other specified health status: Secondary | ICD-10-CM | POA: Diagnosis not present

## 2017-04-10 DIAGNOSIS — E785 Hyperlipidemia, unspecified: Secondary | ICD-10-CM | POA: Diagnosis not present

## 2017-04-10 DIAGNOSIS — J449 Chronic obstructive pulmonary disease, unspecified: Secondary | ICD-10-CM | POA: Diagnosis not present

## 2017-04-10 DIAGNOSIS — I2510 Atherosclerotic heart disease of native coronary artery without angina pectoris: Secondary | ICD-10-CM | POA: Diagnosis not present

## 2017-04-27 DIAGNOSIS — X32XXXA Exposure to sunlight, initial encounter: Secondary | ICD-10-CM | POA: Diagnosis not present

## 2017-04-27 DIAGNOSIS — Z85820 Personal history of malignant melanoma of skin: Secondary | ICD-10-CM | POA: Diagnosis not present

## 2017-04-27 DIAGNOSIS — L249 Irritant contact dermatitis, unspecified cause: Secondary | ICD-10-CM | POA: Diagnosis not present

## 2017-04-27 DIAGNOSIS — Z08 Encounter for follow-up examination after completed treatment for malignant neoplasm: Secondary | ICD-10-CM | POA: Diagnosis not present

## 2017-04-27 DIAGNOSIS — Z85828 Personal history of other malignant neoplasm of skin: Secondary | ICD-10-CM | POA: Diagnosis not present

## 2017-04-27 DIAGNOSIS — C44319 Basal cell carcinoma of skin of other parts of face: Secondary | ICD-10-CM | POA: Diagnosis not present

## 2017-04-27 DIAGNOSIS — L570 Actinic keratosis: Secondary | ICD-10-CM | POA: Diagnosis not present

## 2017-04-27 DIAGNOSIS — D485 Neoplasm of uncertain behavior of skin: Secondary | ICD-10-CM | POA: Diagnosis not present

## 2017-06-10 DIAGNOSIS — C44319 Basal cell carcinoma of skin of other parts of face: Secondary | ICD-10-CM | POA: Diagnosis not present

## 2017-06-10 DIAGNOSIS — L905 Scar conditions and fibrosis of skin: Secondary | ICD-10-CM | POA: Diagnosis not present

## 2017-08-10 DIAGNOSIS — J029 Acute pharyngitis, unspecified: Secondary | ICD-10-CM | POA: Diagnosis not present

## 2017-08-10 DIAGNOSIS — H9201 Otalgia, right ear: Secondary | ICD-10-CM | POA: Diagnosis not present

## 2017-09-02 DIAGNOSIS — E785 Hyperlipidemia, unspecified: Secondary | ICD-10-CM | POA: Diagnosis not present

## 2017-09-02 DIAGNOSIS — I10 Essential (primary) hypertension: Secondary | ICD-10-CM | POA: Diagnosis not present

## 2017-09-02 DIAGNOSIS — Z0000 Encounter for general adult medical examination without abnormal findings: Secondary | ICD-10-CM | POA: Diagnosis not present

## 2017-09-02 DIAGNOSIS — I2510 Atherosclerotic heart disease of native coronary artery without angina pectoris: Secondary | ICD-10-CM | POA: Diagnosis not present

## 2017-09-02 DIAGNOSIS — R1313 Dysphagia, pharyngeal phase: Secondary | ICD-10-CM | POA: Diagnosis not present

## 2017-09-02 DIAGNOSIS — J449 Chronic obstructive pulmonary disease, unspecified: Secondary | ICD-10-CM | POA: Diagnosis not present

## 2017-09-02 DIAGNOSIS — H918X3 Other specified hearing loss, bilateral: Secondary | ICD-10-CM | POA: Diagnosis not present

## 2017-09-08 DIAGNOSIS — L570 Actinic keratosis: Secondary | ICD-10-CM | POA: Diagnosis not present

## 2017-09-08 DIAGNOSIS — Z85828 Personal history of other malignant neoplasm of skin: Secondary | ICD-10-CM | POA: Diagnosis not present

## 2017-09-08 DIAGNOSIS — L821 Other seborrheic keratosis: Secondary | ICD-10-CM | POA: Diagnosis not present

## 2017-09-08 DIAGNOSIS — E785 Hyperlipidemia, unspecified: Secondary | ICD-10-CM | POA: Diagnosis not present

## 2017-09-08 DIAGNOSIS — L111 Transient acantholytic dermatosis [Grover]: Secondary | ICD-10-CM | POA: Diagnosis not present

## 2017-09-08 DIAGNOSIS — Z85820 Personal history of malignant melanoma of skin: Secondary | ICD-10-CM | POA: Diagnosis not present

## 2017-09-08 DIAGNOSIS — H918X3 Other specified hearing loss, bilateral: Secondary | ICD-10-CM | POA: Diagnosis not present

## 2017-09-08 DIAGNOSIS — I2510 Atherosclerotic heart disease of native coronary artery without angina pectoris: Secondary | ICD-10-CM | POA: Diagnosis not present

## 2017-09-08 DIAGNOSIS — I10 Essential (primary) hypertension: Secondary | ICD-10-CM | POA: Diagnosis not present

## 2017-09-08 DIAGNOSIS — J449 Chronic obstructive pulmonary disease, unspecified: Secondary | ICD-10-CM | POA: Diagnosis not present

## 2017-09-08 DIAGNOSIS — H9201 Otalgia, right ear: Secondary | ICD-10-CM | POA: Diagnosis not present

## 2017-09-08 DIAGNOSIS — X32XXXA Exposure to sunlight, initial encounter: Secondary | ICD-10-CM | POA: Diagnosis not present

## 2017-10-12 DIAGNOSIS — Z789 Other specified health status: Secondary | ICD-10-CM | POA: Diagnosis not present

## 2017-10-12 DIAGNOSIS — I2510 Atherosclerotic heart disease of native coronary artery without angina pectoris: Secondary | ICD-10-CM | POA: Diagnosis not present

## 2017-10-12 DIAGNOSIS — J449 Chronic obstructive pulmonary disease, unspecified: Secondary | ICD-10-CM | POA: Diagnosis not present

## 2017-10-12 DIAGNOSIS — R0602 Shortness of breath: Secondary | ICD-10-CM | POA: Diagnosis not present

## 2017-10-12 DIAGNOSIS — E785 Hyperlipidemia, unspecified: Secondary | ICD-10-CM | POA: Diagnosis not present

## 2017-10-12 DIAGNOSIS — I10 Essential (primary) hypertension: Secondary | ICD-10-CM | POA: Diagnosis not present

## 2017-10-30 DIAGNOSIS — J449 Chronic obstructive pulmonary disease, unspecified: Secondary | ICD-10-CM | POA: Diagnosis not present

## 2017-10-30 DIAGNOSIS — R05 Cough: Secondary | ICD-10-CM | POA: Diagnosis not present

## 2017-10-30 DIAGNOSIS — R0602 Shortness of breath: Secondary | ICD-10-CM | POA: Diagnosis not present

## 2017-11-13 DIAGNOSIS — H6061 Unspecified chronic otitis externa, right ear: Secondary | ICD-10-CM | POA: Diagnosis not present

## 2017-11-13 DIAGNOSIS — H9209 Otalgia, unspecified ear: Secondary | ICD-10-CM | POA: Diagnosis not present

## 2018-03-02 DIAGNOSIS — H918X3 Other specified hearing loss, bilateral: Secondary | ICD-10-CM | POA: Diagnosis not present

## 2018-03-02 DIAGNOSIS — J449 Chronic obstructive pulmonary disease, unspecified: Secondary | ICD-10-CM | POA: Diagnosis not present

## 2018-03-02 DIAGNOSIS — E785 Hyperlipidemia, unspecified: Secondary | ICD-10-CM | POA: Diagnosis not present

## 2018-03-02 DIAGNOSIS — I10 Essential (primary) hypertension: Secondary | ICD-10-CM | POA: Diagnosis not present

## 2018-03-02 DIAGNOSIS — I2510 Atherosclerotic heart disease of native coronary artery without angina pectoris: Secondary | ICD-10-CM | POA: Diagnosis not present

## 2018-03-09 DIAGNOSIS — E785 Hyperlipidemia, unspecified: Secondary | ICD-10-CM | POA: Diagnosis not present

## 2018-03-09 DIAGNOSIS — H918X3 Other specified hearing loss, bilateral: Secondary | ICD-10-CM | POA: Diagnosis not present

## 2018-03-09 DIAGNOSIS — I2510 Atherosclerotic heart disease of native coronary artery without angina pectoris: Secondary | ICD-10-CM | POA: Diagnosis not present

## 2018-03-09 DIAGNOSIS — Z789 Other specified health status: Secondary | ICD-10-CM | POA: Diagnosis not present

## 2018-03-09 DIAGNOSIS — Z0000 Encounter for general adult medical examination without abnormal findings: Secondary | ICD-10-CM | POA: Diagnosis not present

## 2018-03-09 DIAGNOSIS — I10 Essential (primary) hypertension: Secondary | ICD-10-CM | POA: Diagnosis not present

## 2018-03-15 ENCOUNTER — Other Ambulatory Visit: Payer: Self-pay

## 2018-03-15 ENCOUNTER — Encounter: Payer: Self-pay | Admitting: Emergency Medicine

## 2018-03-15 ENCOUNTER — Emergency Department: Payer: PPO

## 2018-03-15 ENCOUNTER — Inpatient Hospital Stay
Admission: EM | Admit: 2018-03-15 | Discharge: 2018-03-17 | DRG: 312 | Disposition: A | Discharge status: Home / Self Care | Payer: PPO | Attending: Internal Medicine | Admitting: Internal Medicine

## 2018-03-15 DIAGNOSIS — E785 Hyperlipidemia, unspecified: Secondary | ICD-10-CM | POA: Diagnosis not present

## 2018-03-15 DIAGNOSIS — I10 Essential (primary) hypertension: Secondary | ICD-10-CM | POA: Diagnosis not present

## 2018-03-15 DIAGNOSIS — I509 Heart failure, unspecified: Secondary | ICD-10-CM | POA: Diagnosis not present

## 2018-03-15 DIAGNOSIS — I6523 Occlusion and stenosis of bilateral carotid arteries: Secondary | ICD-10-CM | POA: Diagnosis not present

## 2018-03-15 DIAGNOSIS — Z79899 Other long term (current) drug therapy: Secondary | ICD-10-CM | POA: Diagnosis not present

## 2018-03-15 DIAGNOSIS — H9193 Unspecified hearing loss, bilateral: Secondary | ICD-10-CM | POA: Diagnosis not present

## 2018-03-15 DIAGNOSIS — Z87442 Personal history of urinary calculi: Secondary | ICD-10-CM

## 2018-03-15 DIAGNOSIS — S299XXA Unspecified injury of thorax, initial encounter: Secondary | ICD-10-CM | POA: Diagnosis not present

## 2018-03-15 DIAGNOSIS — Z9049 Acquired absence of other specified parts of digestive tract: Secondary | ICD-10-CM | POA: Diagnosis not present

## 2018-03-15 DIAGNOSIS — I2510 Atherosclerotic heart disease of native coronary artery without angina pectoris: Secondary | ICD-10-CM | POA: Diagnosis present

## 2018-03-15 DIAGNOSIS — R55 Syncope and collapse: Secondary | ICD-10-CM | POA: Diagnosis present

## 2018-03-15 DIAGNOSIS — I6529 Occlusion and stenosis of unspecified carotid artery: Secondary | ICD-10-CM | POA: Diagnosis not present

## 2018-03-15 DIAGNOSIS — J449 Chronic obstructive pulmonary disease, unspecified: Secondary | ICD-10-CM | POA: Diagnosis present

## 2018-03-15 DIAGNOSIS — Z9981 Dependence on supplemental oxygen: Secondary | ICD-10-CM

## 2018-03-15 DIAGNOSIS — W1830XA Fall on same level, unspecified, initial encounter: Secondary | ICD-10-CM | POA: Diagnosis present

## 2018-03-15 DIAGNOSIS — I110 Hypertensive heart disease with heart failure: Secondary | ICD-10-CM | POA: Diagnosis not present

## 2018-03-15 DIAGNOSIS — I4510 Unspecified right bundle-branch block: Secondary | ICD-10-CM | POA: Diagnosis not present

## 2018-03-15 DIAGNOSIS — I951 Orthostatic hypotension: Secondary | ICD-10-CM | POA: Diagnosis not present

## 2018-03-15 LAB — COMPREHENSIVE METABOLIC PANEL
ALT: 18 U/L (ref 17–63)
AST: 25 U/L (ref 15–41)
Albumin: 4 g/dL (ref 3.5–5.0)
Alkaline Phosphatase: 73 U/L (ref 38–126)
Anion gap: 6 (ref 5–15)
BILIRUBIN TOTAL: 0.9 mg/dL (ref 0.3–1.2)
BUN: 10 mg/dL (ref 6–20)
CHLORIDE: 107 mmol/L (ref 101–111)
CO2: 26 mmol/L (ref 22–32)
Calcium: 9 mg/dL (ref 8.9–10.3)
Creatinine, Ser: 0.84 mg/dL (ref 0.61–1.24)
GFR calc Af Amer: >60 mL/min (ref >60)
Glucose, Bld: 98 mg/dL (ref 65–99)
Potassium: 3.9 mmol/L (ref 3.5–5.1)
Sodium: 139 mmol/L (ref 135–145)
TOTAL PROTEIN: 6.8 g/dL (ref 6.5–8.1)

## 2018-03-15 LAB — URINALYSIS, COMPLETE (UACMP) WITH MICROSCOPIC
Bacteria, UA: NONE SEEN
Bilirubin Urine: NEGATIVE
GLUCOSE, UA: NEGATIVE mg/dL
Hgb urine dipstick: NEGATIVE
Ketones, ur: NEGATIVE mg/dL
Leukocytes, UA: NEGATIVE
Nitrite: NEGATIVE
PH: 7 (ref 5.0–8.0)
Protein, ur: NEGATIVE mg/dL
Specific Gravity, Urine: 1.005 (ref 1.005–1.030)

## 2018-03-15 LAB — TROPONIN I: Troponin I: <0.03 ng/mL (ref <0.03)

## 2018-03-15 LAB — CBC
HCT: 47.8 % (ref 40.0–52.0)
Hemoglobin: 16.1 g/dL (ref 13.0–18.0)
MCH: 31.5 pg (ref 26.0–34.0)
MCHC: 33.7 g/dL (ref 32.0–36.0)
MCV: 93.5 fL (ref 80.0–100.0)
PLATELETS: 196 10*3/uL (ref 150–440)
RBC: 5.11 MIL/uL (ref 4.40–5.90)
RDW: 13.8 % (ref 11.5–14.5)
WBC: 9 10*3/uL (ref 3.8–10.6)

## 2018-03-15 IMAGING — DX DG CHEST 1V PORT
2 series · 2 of 2 positions shown · non-contrast
Comparison: None.

CLINICAL DATA: Weakness, fall yesterday. Intermittent dizziness.
History of coronary artery disease, hypertension, heart failure.

EXAM:
PORTABLE CHEST 1 VIEW

[chest ap (1 of 2)]
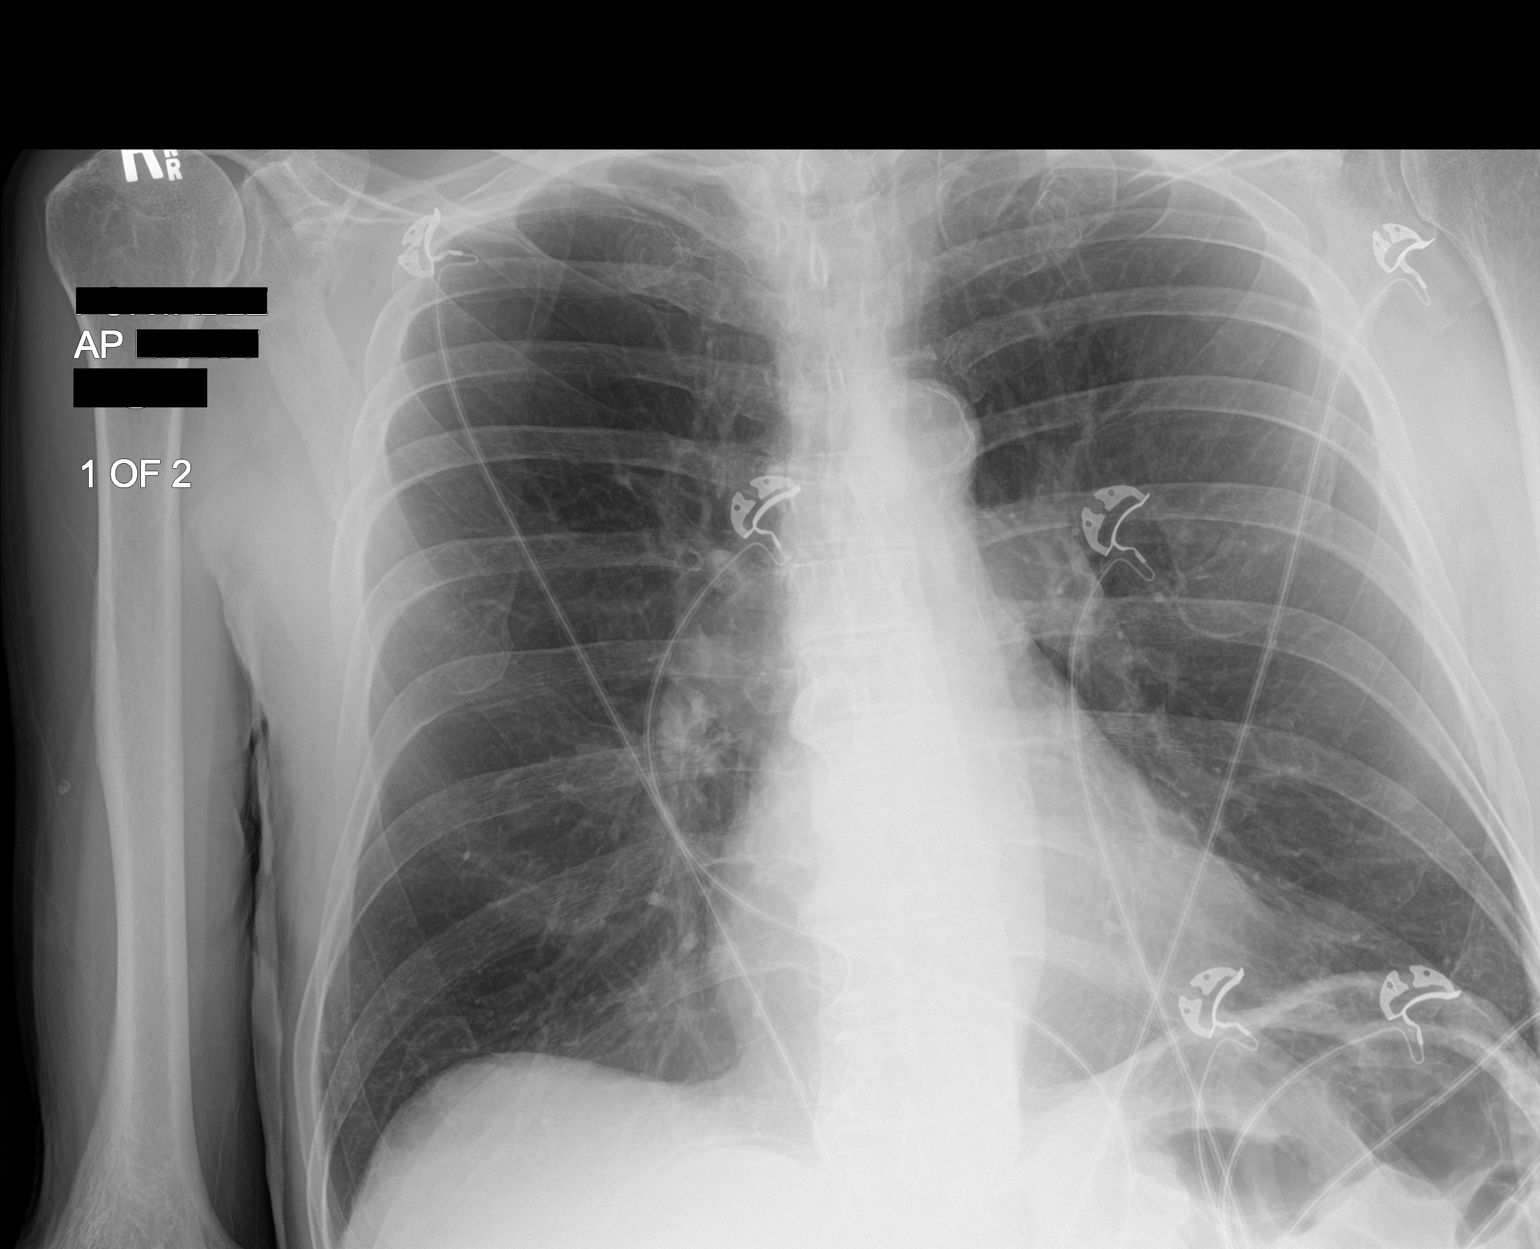

[chest ap (2 of 2)]
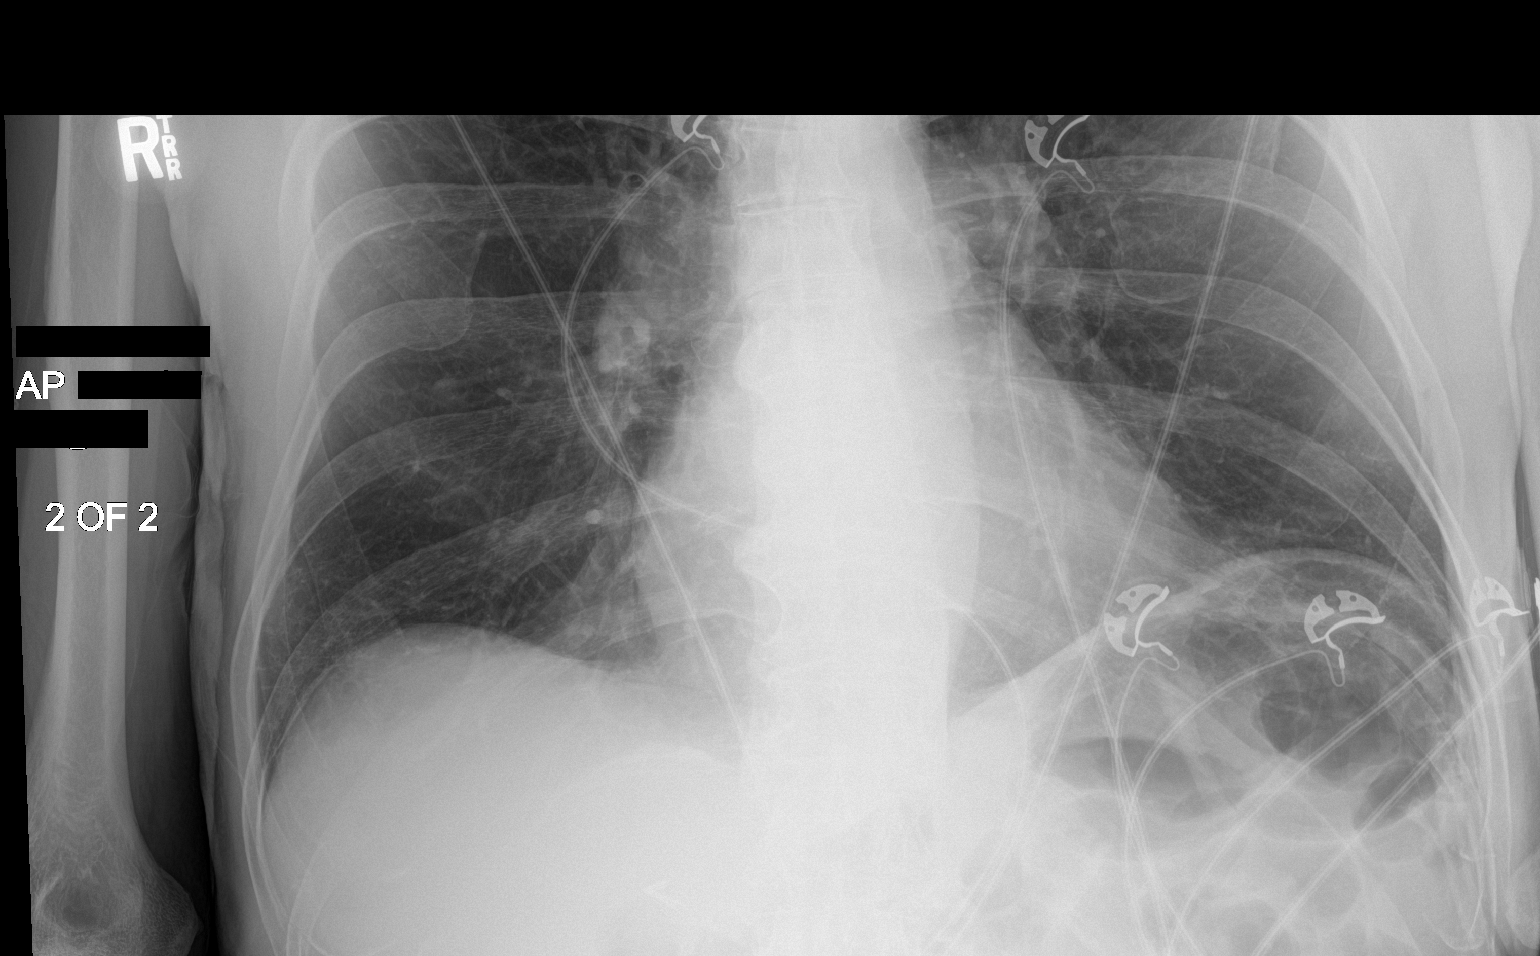

[2 of 2 positions shown; findings below may reference images not displayed]

FINDINGS: Heart size and mediastinal contours are within normal limits.
Atherosclerotic changes noted at the aortic arch. Lungs are clear.
No pleural effusion or pneumothorax seen. No acute or suspicious
osseous finding.
IMPRESSION: 1. No active disease. No evidence of pneumonia or pulmonary edema.
No osseous fracture or dislocation seen.
2. Aortic atherosclerosis.

## 2018-03-15 MED ORDER — SODIUM CHLORIDE 0.9 % IV SOLN
INTRAVENOUS | Status: DC
  Administered 2018-03-16: 01:00:00 via INTRAVENOUS

## 2018-03-15 MED ORDER — ENOXAPARIN SODIUM 40 MG/0.4ML ~~LOC~~ SOLN
40.0000 mg | SUBCUTANEOUS | Status: DC
  Administered 2018-03-15 – 2018-03-16 (×2): 40 mg via SUBCUTANEOUS
  Filled 2018-03-15 (×2): qty 0.4

## 2018-03-15 MED ORDER — EZETIMIBE 10 MG PO TABS
10.0000 mg | ORAL_TABLET | ORAL | Status: DC
  Administered 2018-03-15 – 2018-03-17 (×2): 10 mg via ORAL
  Filled 2018-03-15 (×2): qty 1

## 2018-03-15 MED ORDER — FLUTICASONE PROPIONATE 50 MCG/ACT NA SUSP
2.0000 | Freq: Every day | NASAL | Status: DC
  Administered 2018-03-16 – 2018-03-17 (×2): 2 via NASAL
  Filled 2018-03-15: qty 16

## 2018-03-15 MED ORDER — ONDANSETRON HCL 4 MG/2ML IJ SOLN: 4.0000 mg | Freq: Four times a day (QID) | INTRAMUSCULAR | Status: DC | PRN

## 2018-03-15 MED ORDER — TIOTROPIUM BROMIDE MONOHYDRATE 18 MCG IN CAPS
18.0000 ug | ORAL_CAPSULE | Freq: Every day | RESPIRATORY_TRACT | Status: DC
  Administered 2018-03-16 – 2018-03-17 (×2): 18 ug via RESPIRATORY_TRACT
  Filled 2018-03-15: qty 5

## 2018-03-15 MED ORDER — VITAMIN B-12 1000 MCG PO TABS
1000.0000 ug | ORAL_TABLET | Freq: Every day | ORAL | Status: DC
  Administered 2018-03-16 – 2018-03-17 (×2): 1000 ug via ORAL
  Filled 2018-03-15 (×2): qty 1

## 2018-03-15 MED ORDER — ZOLPIDEM TARTRATE 5 MG PO TABS
5.0000 mg | ORAL_TABLET | Freq: Every evening | ORAL | Status: DC | PRN
  Administered 2018-03-15 – 2018-03-16 (×2): 5 mg via ORAL
  Filled 2018-03-15 (×2): qty 1

## 2018-03-15 MED ORDER — SODIUM CHLORIDE 0.9% FLUSH
3.0000 mL | Freq: Two times a day (BID) | INTRAVENOUS | Status: DC
  Administered 2018-03-15 – 2018-03-16 (×2): 3 mL via INTRAVENOUS

## 2018-03-15 MED ORDER — PANTOPRAZOLE SODIUM 40 MG PO TBEC
40.0000 mg | DELAYED_RELEASE_TABLET | Freq: Every day | ORAL | Status: DC
  Administered 2018-03-16 – 2018-03-17 (×2): 40 mg via ORAL
  Filled 2018-03-15 (×2): qty 1

## 2018-03-15 MED ORDER — METOPROLOL SUCCINATE ER 25 MG PO TB24
25.0000 mg | ORAL_TABLET | Freq: Every day | ORAL | Status: DC
  Administered 2018-03-16 – 2018-03-17 (×2): 25 mg via ORAL
  Filled 2018-03-15 (×2): qty 1

## 2018-03-15 MED ORDER — HYDROCORTISONE 2.5 % RE CREA
1.0000 "application " | TOPICAL_CREAM | Freq: Two times a day (BID) | RECTAL | Status: DC
  Filled 2018-03-15: qty 28.35

## 2018-03-15 MED ORDER — IPRATROPIUM BROMIDE 0.03 % NA SOLN
2.0000 | Freq: Three times a day (TID) | NASAL | Status: DC | PRN
  Filled 2018-03-15: qty 30

## 2018-03-15 MED ORDER — ADULT MULTIVITAMIN W/MINERALS CH
1.0000 | ORAL_TABLET | Freq: Every day | ORAL | Status: DC
  Administered 2018-03-16 – 2018-03-17 (×2): 1 via ORAL
  Filled 2018-03-15 (×2): qty 1

## 2018-03-15 MED ORDER — ALBUTEROL SULFATE (2.5 MG/3ML) 0.083% IN NEBU: 3.0000 mL | INHALATION_SOLUTION | Freq: Four times a day (QID) | RESPIRATORY_TRACT | Status: DC | PRN

## 2018-03-15 MED ORDER — CALCIUM POLYCARBOPHIL 625 MG PO TABS
625.0000 mg | ORAL_TABLET | Freq: Every evening | ORAL | Status: DC
  Administered 2018-03-15 – 2018-03-16 (×2): 625 mg via ORAL
  Filled 2018-03-15 (×3): qty 1

## 2018-03-15 MED ORDER — ACETAMINOPHEN 650 MG RE SUPP: 650.0000 mg | Freq: Four times a day (QID) | RECTAL | Status: DC | PRN

## 2018-03-15 MED ORDER — ACETAMINOPHEN 325 MG PO TABS: 650.0000 mg | ORAL_TABLET | Freq: Four times a day (QID) | ORAL | Status: DC | PRN

## 2018-03-15 MED ORDER — DOCUSATE SODIUM 100 MG PO CAPS
100.0000 mg | ORAL_CAPSULE | Freq: Two times a day (BID) | ORAL | Status: DC
  Administered 2018-03-16 – 2018-03-17 (×3): 100 mg via ORAL
  Filled 2018-03-15 (×3): qty 1

## 2018-03-15 MED ORDER — ONDANSETRON HCL 4 MG PO TABS: 4.0000 mg | ORAL_TABLET | Freq: Four times a day (QID) | ORAL | Status: DC | PRN

## 2018-03-15 NOTE — ED Provider Notes (Signed)
[facility] Emergency Department Provider Note  ____________________________________________   I have reviewed the triage vital signs and the nursing notes. Where available I have reviewed prior notes and, if possible and indicated, outside hospital notes.    HISTORY  Chief Complaint Near Syncope and Fall    HPI Eric Li is a 83 y.o. male who presents today after having 2 near syncopal events.  One happened yesterday around 1:00 and then again today.  His family states that when he was on the ground they checked his heart rate today and it was in the 30s.  Patient is on metoprolol.  Takes his metoprolol usually around 11:00 in the morning.  It was within a couple hours of this each time that he had these events.  He states he did not hit his head he did not completely pass out but he stood up felt weak and yesterday fell and today did not.  He has no injury from these falls.  Denies chest pain shortness of breath or nausea or vomiting.  Has chronic shortness of breath and COPD but has no change in that.  Has no exertional symptoms both times it happened while he was standing up.  Has not had any change in his medication that he knows of.  No other complaints.     Past Medical History:  Diagnosis Date  . Bilateral hearing loss 03/03/2016  . CAD (coronary artery disease) 02/24/2014  . COPD, moderate ([place]) 08/29/2015  . Hemorrhoids 02/24/2014  . HTN (hypertension) 02/24/2014  . Hyperlipidemia, unspecified 02/24/2014  . Nephrolithiasis   . SOB (shortness of breath) on exertion 03/27/2015  . Statin intolerance 03/30/2015    Patient Active Problem List   Diagnosis Date Noted  . Bilateral hearing loss 03/03/2016  . COPD, moderate ([place]) 08/29/2015  . Statin intolerance 03/30/2015  . SOB (shortness of breath) on exertion 03/27/2015  . CAD (coronary artery disease) 02/24/2014  . Hemorrhoids 02/24/2014  . HTN (hypertension) 02/24/2014  . Hyperlipidemia, unspecified  02/24/2014    Past Surgical History:  Procedure Laterality Date  . CHOLECYSTECTOMY    . TONSILLECTOMY      Prior to Admission medications   Medication Sig Start Date End Date Taking? Authorizing Provider  omeprazole (PRILOSEC) 20 MG capsule Take 20 mg by mouth daily. 03/02/18  Yes [provider]  albuterol (PROVENTIL HFA;VENTOLIN HFA) 108 (90 Base) MCG/ACT inhaler Inhale into the lungs.    [provider]  aspirin EC 81 MG tablet Take by mouth.    [provider]  ezetimibe (ZETIA) 10 MG tablet  09/22/16   [provider]  metoprolol succinate (TOPROL-XL) 25 MG 24 hr tablet  10/20/16   [provider]  SPIRIVA HANDIHALER 18 MCG inhalation capsule  10/15/16   [provider]  zolpidem (AMBIEN) 10 MG tablet  10/15/16   [provider]    Allergies Patient has no known allergies.  Family History  Problem Relation Age of Onset  . Bladder Cancer Neg Hx   . Prolactinoma Neg Hx   . Prostate cancer Neg Hx   . Kidney cancer Neg Hx     Social History Social History   Tobacco Use  . Smoking status: Former Research scientist (life sciences)  . Smokeless tobacco: Never Used  Substance Use Topics  . Alcohol use: No  . Drug use: No    Review of Systems Constitutional: No fever/chills Eyes: No visual changes. ENT: No sore throat. No stiff neck no neck pain Cardiovascular:  Denies chest pain. Respiratory: Denies shortness of breath. Gastrointestinal:   no vomiting.  No diarrhea.  No constipation. Genitourinary: Negative for dysuria. Musculoskeletal: Negative lower extremity swelling Skin: Negative for rash. Neurological: Negative for severe headaches, focal weakness or numbness.   ____________________________________________   PHYSICAL EXAM:  VITAL SIGNS: ED Triage Vitals  Enc Vitals Group     BP 03/15/18 1718 134/79     Pulse Rate 03/15/18 1718 63     Resp 03/15/18 1718 18     Temp 03/15/18 1718 98.9 F (37.2 C)     Temp Source 03/15/18  1718 Oral     SpO2 03/15/18 1718 96 %     Weight 03/15/18 1725 128 lb (58.1 kg)     Height 03/15/18 1725 5\' 5"  (1.651 m)     Head Circumference --      Peak Flow --      Pain Score 03/15/18 1824 0     Pain Loc --      Pain Edu? --      Excl. in [place]? --     Constitutional: Alert and oriented. Well appearing and in no acute distress. Eyes: Conjunctivae are normal Head: Atraumatic HEENT: No congestion/rhinnorhea. Mucous membranes are moist.  Oropharynx non-erythematous Neck:   Nontender with no meningismus, no masses, no stridor Cardiovascular: Normal rate, regular rhythm. Grossly normal heart sounds.  Good peripheral circulation. Respiratory: Normal respiratory effort.  No retractions. Lungs CTAB. Abdominal: Soft and nontender. No distention. No guarding no rebound Back:  There is no focal tenderness or step off.  there is no midline tenderness there are no lesions noted. there is no CVA tenderness Musculoskeletal: No lower extremity tenderness, no upper extremity tenderness. No joint effusions, no DVT signs strong distal pulses no edema Neurologic:  Normal speech and language. No gross focal neurologic deficits are appreciated.  Skin:  Skin is warm, dry and intact. No rash noted. Psychiatric: Mood and affect are normal. Speech and behavior are normal.  ____________________________________________   LABS (all labs ordered are listed, but only abnormal results are displayed)  Labs Reviewed  URINALYSIS, COMPLETE (UACMP) WITH MICROSCOPIC - Abnormal; Notable for the following components:      Result Value   Color, Urine YELLOW (*)    APPearance CLEAR (*)    All other components within normal limits  CBC  COMPREHENSIVE METABOLIC PANEL  TROPONIN I    Pertinent labs  results that were available during my care of the patient were reviewed by me and considered in my medical decision making (see chart for details). ____________________________________________  EKG  I personally  interpreted any EKGs ordered by me or triage Sinus rhythm rate 67 right bundle branch block LAFB, first-degree AV block Repeat EKG shows the same, prolonged PR sinus rhythm, RBBB LAFB no acute ischemia ____________________________________________  RADIOLOGY  Pertinent labs & imaging results that were available during my care of the patient were reviewed by me and considered in my medical decision making (see chart for details). If possible, patient and/or family made aware of any abnormal findings.  Dg Chest Port 1 View  Result Date: 03/15/2018 CLINICAL DATA:  Weakness, fall yesterday. Intermittent dizziness. History of coronary artery disease, hypertension, heart failure. EXAM: PORTABLE CHEST 1 VIEW COMPARISON:  None. FINDINGS: Heart size and mediastinal contours are within normal limits. Atherosclerotic changes noted at the aortic arch. Lungs are clear. No pleural effusion or pneumothorax seen. No acute or suspicious osseous finding. IMPRESSION: 1. No active disease. No evidence of pneumonia  or pulmonary edema. No osseous fracture or dislocation seen. 2. Aortic atherosclerosis. Electronically Signed   By: Franki Cabot M.D.   On: 03/15/2018 19:18   ____________________________________________    PROCEDURES  Procedure(s) performed: None  Procedures  Critical Care performed: None  ____________________________________________   INITIAL IMPRESSION / ASSESSMENT AND PLAN / ED COURSE  Pertinent labs & imaging results that were available during my care of the patient were reviewed by me and considered in my medical decision making (see chart for details).  Patient here with positional syncope symptoms, most likely this is related to his metoprolol but I am concerned that the family found him with a heart rate in the 30s at home, this could have been vasovagal but I think it merits observational evaluation.  Dr. Georgia Dom of cardiology agrees and we will admit to the hospitalist service.     ____________________________________________   FINAL CLINICAL IMPRESSION(S) / ED DIAGNOSES  Final diagnoses:  Near syncope      This chart was dictated using voice recognition software.  Despite best efforts to proofread,  errors can occur which can change meaning.      Schuyler Amor, MD 03/15/18 [phone]

## 2018-03-15 NOTE — Progress Notes (Signed)
Family Meeting Note  Advance Directive:yes  Today a meeting took place with the Patient wife and daughter at bedside    The following clinical team members were present during this meeting:MD  The following were discussed:Patient's diagnosis: Syncope, orthostatic hypotension treatment plan of care discussed in detail with the patient and family members at bedside.  Other comorbidities as documented below are also discussed   Bilateral hearing loss 03/03/2016   . CAD (coronary artery disease) 02/24/2014  . COPD, moderate ([place]) 08/29/2015  . Hemorrhoids 02/24/2014  . HTN (hypertension) 02/24/2014  . Hyperlipidemia, unspecified 02/24/2014  . Nephrolithiasis   . SOB (shortness of breath) on exertion 03/27/2015  . Statin intolerance        , Patient's progosis: Unable to determine and Goals for treatment: Full Code wife is the main healthcare power of attorney, and daughter  Additional follow-up to be provided: Hospitalist, cardiology  Time spent during discussion:17 min  Nicholes Mango, MD

## 2018-03-15 NOTE — ED Notes (Signed)
Repeat EKG completed per [facility] request

## 2018-03-15 NOTE — H&P (Signed)
[facility] NAME: Eric Li    MR#:  [id number]  DATE OF BIRTH:  02/21/1935  DATE OF ADMISSION:  03/15/2018  PRIMARY CARE PHYSICIAN: [name], MD   REQUESTING/REFERRING PHYSICIAN: Schuyler Amor, MD  CHIEF COMPLAINT:   Syncope and fall HISTORY OF PRESENT ILLNESS:  Eric Li  is a 83 y.o. male with a known history of coronary artery disease, COPD, hypertension, hyperlipidemia and other medical problems had 2 near syncopal episodes and fell down.  One episode happened yesterday around 1 PM.  Patient denies hitting his head.  ED physician has discussed with the patient's cardiology Dr. call wood , has recommended to admit the patient overnight for observation PAST MEDICAL HISTORY:   Past Medical History:  Diagnosis Date  . Bilateral hearing loss 03/03/2016  . CAD (coronary artery disease) 02/24/2014  . COPD, moderate ([place]) 08/29/2015  . Hemorrhoids 02/24/2014  . HTN (hypertension) 02/24/2014  . Hyperlipidemia, unspecified 02/24/2014  . Nephrolithiasis   . SOB (shortness of breath) on exertion 03/27/2015  . Statin intolerance 03/30/2015    PAST SURGICAL HISTOIRY:   Past Surgical History:  Procedure Laterality Date  . CHOLECYSTECTOMY    . TONSILLECTOMY      SOCIAL HISTORY:   Social History   Tobacco Use  . Smoking status: Former Research scientist (life sciences)  . Smokeless tobacco: Never Used  Substance Use Topics  . Alcohol use: No    FAMILY HISTORY:   Family History  Problem Relation Age of Onset  . Bladder Cancer Neg Hx   . Prolactinoma Neg Hx   . Prostate cancer Neg Hx   . Kidney cancer Neg Hx     DRUG ALLERGIES:  No Known Allergies  REVIEW OF SYSTEMS:  CONSTITUTIONAL: No fever, fatigue or weakness.  EYES: No blurred or double vision.  EARS, NOSE, AND THROAT: No tinnitus or ear pain.  RESPIRATORY: No cough, shortness of breath, wheezing or hemoptysis.  CARDIOVASCULAR: No chest pain, orthopnea, edema.   GASTROINTESTINAL: No nausea, vomiting, diarrhea or abdominal pain.  GENITOURINARY: No dysuria, hematuria.  ENDOCRINE: No polyuria, nocturia,  HEMATOLOGY: No anemia, easy bruising or bleeding SKIN: No rash or lesion. MUSCULOSKELETAL: No joint pain or arthritis.   NEUROLOGIC: No tingling, numbness, weakness.  PSYCHIATRY: No anxiety or depression.   MEDICATIONS AT HOME:   Prior to Admission medications   Medication Sig Start Date End Date Taking? Authorizing Provider  albuterol (PROVENTIL HFA;VENTOLIN HFA) 108 (90 Base) MCG/ACT inhaler Inhale 2 puffs into the lungs every 6 (six) hours as needed for wheezing or shortness of breath.    Yes [provider]  ezetimibe (ZETIA) 10 MG tablet Take 10 mg by mouth 3 (three) times a week.    Yes [provider]  fluticasone (FLONASE) 50 MCG/ACT nasal spray Place 2 sprays into both nostrils daily.   Yes [provider]  hydrocortisone 2.5 % cream Apply 1 application topically 2 (two) times daily.   Yes [provider]  ipratropium (ATROVENT) 0.03 % nasal spray Place 2 sprays into both nostrils 3 (three) times daily as needed for rhinitis.   Yes [provider]  metoprolol succinate (TOPROL-XL) 25 MG 24 hr tablet Take 25 mg by mouth daily.  10/20/16  Yes [provider]  mometasone (ELOCON) 0.1 % lotion Instill 4 drops in each ear at bedtime as needed for dryness or itch   Yes [provider]  Multiple Vitamin (MULTIVITAMIN WITH MINERALS) TABS tablet Take  1 tablet by mouth daily.   Yes [provider]  omeprazole (PRILOSEC) 20 MG capsule Take 20 mg by mouth daily. 03/02/18  Yes [provider]  polycarbophil (FIBERCON) 625 MG tablet Take 625 mg by mouth daily.   Yes [provider]  SPIRIVA HANDIHALER 18 MCG inhalation capsule Place 18 mcg into inhaler and inhale daily.  10/15/16  Yes [provider]  vitamin B-12 (CYANOCOBALAMIN) 1000 MCG tablet Take 1,000 mcg  by mouth daily.   Yes [provider]  zolpidem (AMBIEN) 10 MG tablet Take 10 mg by mouth at bedtime as needed for sleep.  10/15/16  Yes [provider]      VITAL SIGNS:  Blood pressure (!) 146/74, pulse (!) 58, temperature 97.9 F (36.6 C), resp. rate 18, height 5\' 5"  (1.651 m), weight 58 kg (127 lb 12.8 oz), SpO2 95 %.  PHYSICAL EXAMINATION:  GENERAL:  83 y.o.-year-old patient lying in the bed with no acute distress.  EYES: Pupils equal, round, reactive to light and accommodation. No scleral icterus. Extraocular muscles intact.  HEENT: Head atraumatic, normocephalic. Oropharynx and nasopharynx clear.  NECK:  Supple, no jugular venous distention. No thyroid enlargement, no tenderness.  LUNGS: Normal breath sounds bilaterally, no wheezing, rales,rhonchi or crepitation. No use of accessory muscles of respiration.  CARDIOVASCULAR: S1, S2 normal. No murmurs, rubs, or gallops.  ABDOMEN: Soft, nontender, nondistended. Bowel sounds present. No organomegaly or mass.  EXTREMITIES: No pedal edema, cyanosis, or clubbing.  NEUROLOGIC: Cranial nerves II through XII are intact. Muscle strength 5/5 in all extremities. Sensation intact. Gait not checked.  PSYCHIATRIC: The patient is alert and oriented x 3.  SKIN: No obvious rash, lesion, or ulcer.   LABORATORY PANEL:   CBC Recent Labs  Lab 03/15/18 1730  WBC 9.0  HGB 16.1  HCT 47.8  PLT 196   ------------------------------------------------------------------------------------------------------------------  Chemistries  Recent Labs  Lab 03/15/18 1730  NA 139  K 3.9  CL 107  CO2 26  GLUCOSE 98  BUN 10  CREATININE 0.84  CALCIUM 9.0  AST 25  ALT 18  ALKPHOS 73  BILITOT 0.9   ------------------------------------------------------------------------------------------------------------------  Cardiac Enzymes Recent Labs  Lab 03/15/18 1730  TROPONINI <0.03    ------------------------------------------------------------------------------------------------------------------  RADIOLOGY:  Dg Chest Port 1 View  Result Date: 03/15/2018 CLINICAL DATA:  Weakness, fall yesterday. Intermittent dizziness. History of coronary artery disease, hypertension, heart failure. EXAM: PORTABLE CHEST 1 VIEW COMPARISON:  None. FINDINGS: Heart size and mediastinal contours are within normal limits. Atherosclerotic changes noted at the aortic arch. Lungs are clear. No pleural effusion or pneumothorax seen. No acute or suspicious osseous finding. IMPRESSION: 1. No active disease. No evidence of pneumonia or pulmonary edema. No osseous fracture or dislocation seen. 2. Aortic atherosclerosis. Electronically Signed   By: Franki Cabot M.D.   On: 03/15/2018 19:18    EKG:   Orders placed or performed during the hospital encounter of 03/15/18  . EKG 12-Lead  . EKG 12-Lead  . ED EKG  . ED EKG  . EKG 12-Lead  . EKG 12-Lead    IMPRESSION AND PLAN:   Marquis Down  is a 83 y.o. male with a known history of coronary artery disease, COPD, hypertension, hyperlipidemia and other medical problems had 2 near syncopal episodes and fell down.  One episode happened yesterday around 1 PM.   #Syncope-probably from orthostatic hypotension Admit to telemetry  cycle cardiac biomarkers Check orthostatic  Gentle hydration with IV fluids PT evaluation Cardiology consult with  Dr. call wood as per the ED physicians discussion  #COPD stable breathing treatments as needed  #Coronary artery disease-continue beta-blocker Zetia  #Hypertension-continue beta-blocker  #Hyperlipidemia-Zetia  All the records are reviewed and case discussed with ED provider. Management plans discussed with the patient, family and they are in agreement.  CODE STATUS: fc, wife is healthcare power of attorney  TOTAL TIME TAKING CARE OF THIS PATIENT: 53minutes.   Note: This dictation was prepared with  Dragon dictation along with smaller phrase technology. Any transcriptional errors that result from this process are unintentional.  Nicholes Mango M.D on 03/15/2018 at 9:55 PM  Between 7am to 6pm - Pager - [phone]  After 6pm go to [email or web address] - password [email or web address] [facility]  Office  [phone]  CC: Primary care physician; [name], MD

## 2018-03-15 NOTE — ED Notes (Signed)
Pt given meal tray.

## 2018-03-15 NOTE — ED Triage Notes (Signed)
States got weak and fell yesterday. States he does not think he had a LOC. States had an unusual sensation on his L side prior to fall but did not note andy deficits when he got up. States he has had intermittent dizziness since. Denies chest pain; states slight increased in exertional SOB today.

## 2018-03-16 ENCOUNTER — Observation Stay
Admit: 2018-03-16 | Discharge: 2018-03-16 | Disposition: A | Discharge status: Home / Self Care | Payer: PPO | Attending: Internal Medicine | Admitting: Internal Medicine

## 2018-03-16 ENCOUNTER — Observation Stay: Payer: PPO

## 2018-03-16 DIAGNOSIS — J449 Chronic obstructive pulmonary disease, unspecified: Secondary | ICD-10-CM | POA: Diagnosis present

## 2018-03-16 DIAGNOSIS — I2510 Atherosclerotic heart disease of native coronary artery without angina pectoris: Secondary | ICD-10-CM | POA: Diagnosis present

## 2018-03-16 DIAGNOSIS — I509 Heart failure, unspecified: Secondary | ICD-10-CM | POA: Diagnosis present

## 2018-03-16 DIAGNOSIS — I951 Orthostatic hypotension: Secondary | ICD-10-CM | POA: Diagnosis present

## 2018-03-16 DIAGNOSIS — E785 Hyperlipidemia, unspecified: Secondary | ICD-10-CM | POA: Diagnosis present

## 2018-03-16 DIAGNOSIS — I110 Hypertensive heart disease with heart failure: Secondary | ICD-10-CM | POA: Diagnosis present

## 2018-03-16 DIAGNOSIS — Z79899 Other long term (current) drug therapy: Secondary | ICD-10-CM | POA: Diagnosis not present

## 2018-03-16 DIAGNOSIS — Z9049 Acquired absence of other specified parts of digestive tract: Secondary | ICD-10-CM | POA: Diagnosis not present

## 2018-03-16 DIAGNOSIS — Z9981 Dependence on supplemental oxygen: Secondary | ICD-10-CM | POA: Diagnosis not present

## 2018-03-16 DIAGNOSIS — H9193 Unspecified hearing loss, bilateral: Secondary | ICD-10-CM | POA: Diagnosis present

## 2018-03-16 DIAGNOSIS — Z87442 Personal history of urinary calculi: Secondary | ICD-10-CM | POA: Diagnosis not present

## 2018-03-16 DIAGNOSIS — I10 Essential (primary) hypertension: Secondary | ICD-10-CM | POA: Diagnosis not present

## 2018-03-16 DIAGNOSIS — I6523 Occlusion and stenosis of bilateral carotid arteries: Secondary | ICD-10-CM | POA: Diagnosis present

## 2018-03-16 DIAGNOSIS — R55 Syncope and collapse: Secondary | ICD-10-CM

## 2018-03-16 DIAGNOSIS — W1830XA Fall on same level, unspecified, initial encounter: Secondary | ICD-10-CM | POA: Diagnosis present

## 2018-03-16 LAB — LIPID PANEL
Cholesterol: 142 mg/dL (ref 0–200)
HDL: 34 mg/dL — ABNORMAL LOW (ref >40)
LDL CALC: 90 mg/dL (ref 0–99)
Total CHOL/HDL Ratio: 4.2 RATIO
Triglycerides: 92 mg/dL (ref <150)
VLDL: 18 mg/dL (ref 0–40)

## 2018-03-16 LAB — TROPONIN I
Troponin I: <0.03 ng/mL (ref <0.03)
Troponin I: <0.03 ng/mL (ref <0.03)

## 2018-03-16 IMAGING — US US CAROTID DUPLEX BILAT
1 series · 13 of 24 positions shown · non-contrast
Comparison: None.

CLINICAL DATA: 83-year-old male with history syncope.

Cardiovascular risk factors include hypertension, hyperlipidemia
EXAM:
BILATERAL CAROTID DUPLEX ULTRASOUND
TECHNIQUE: Gray scale imaging, color Doppler and duplex ultrasound were
performed of bilateral carotid and vertebral arteries in the neck.

[Series 1: us carotid duplex bilat · 13 of 62 slices shown]
[im 1/62]
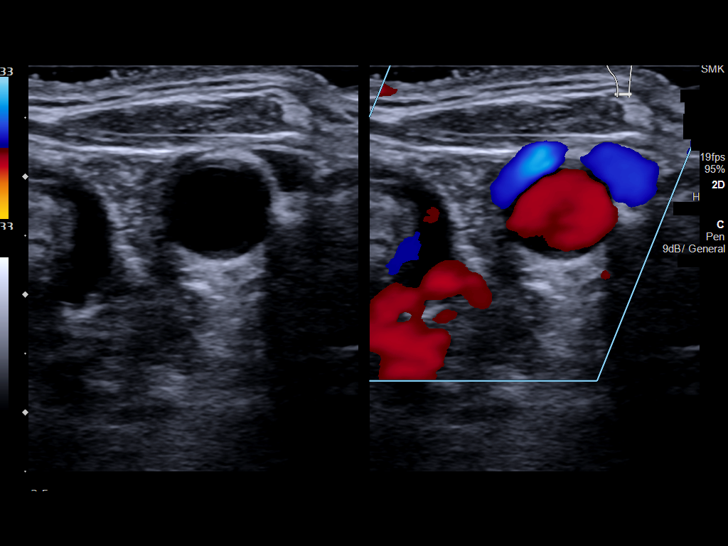
[im 6/62]
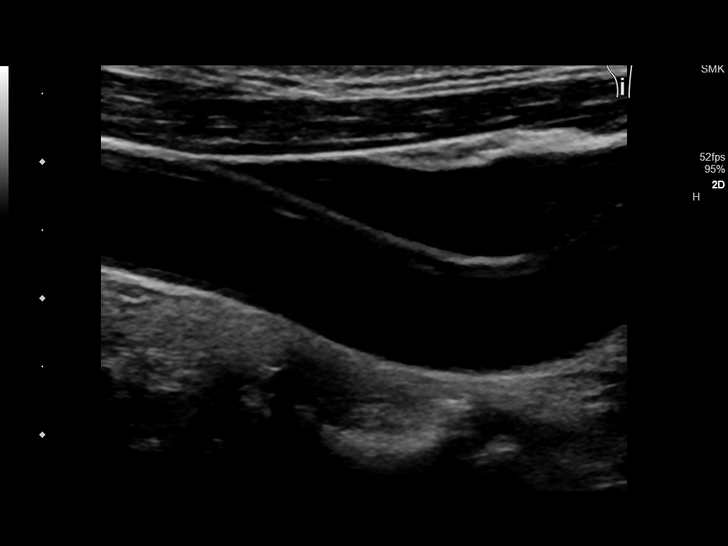
[im 11/62]
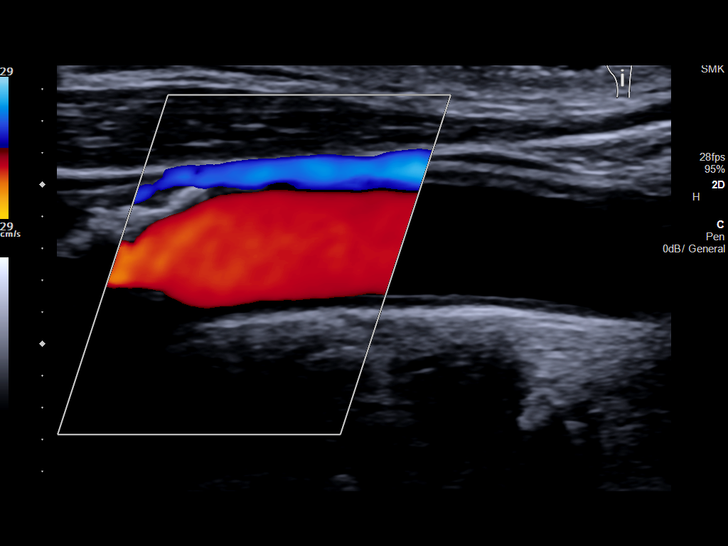
[im 16/62]
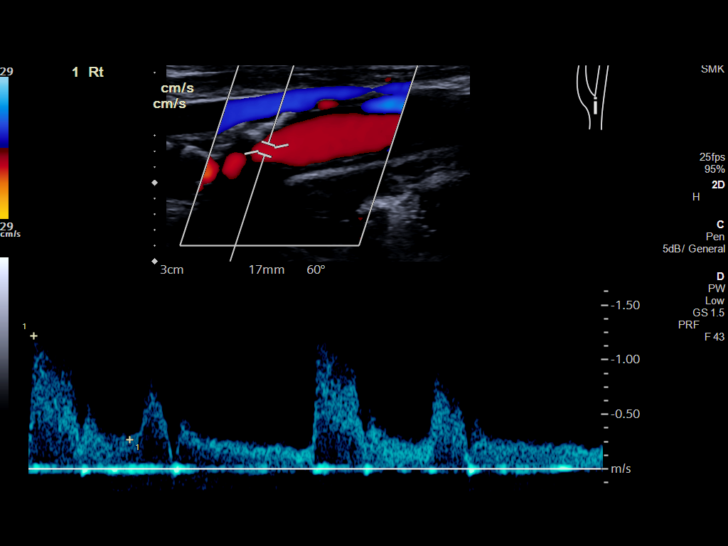
[im 22/62]
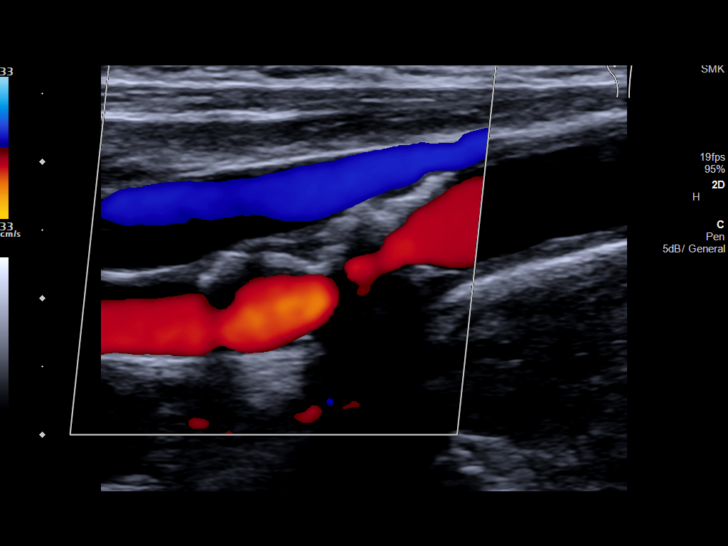
[im 27/62]
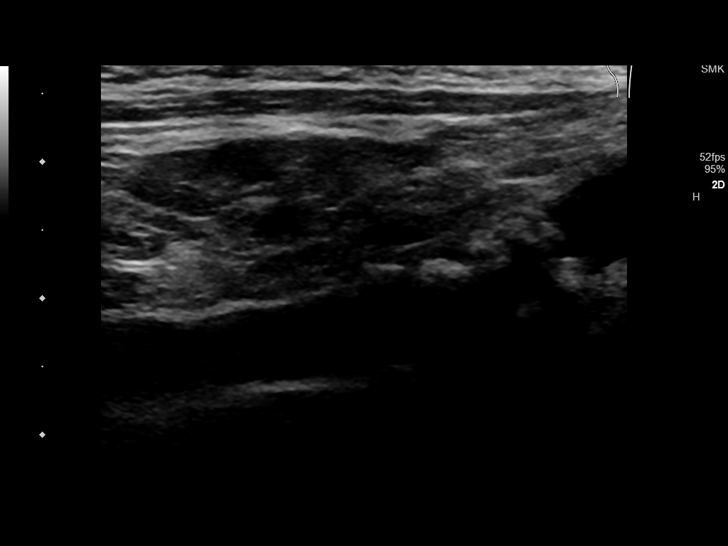
[im 32/62]
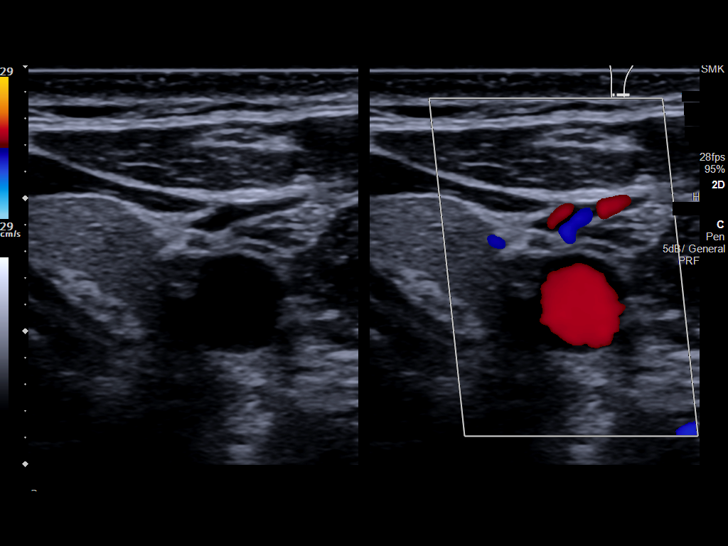
[im 35/62]
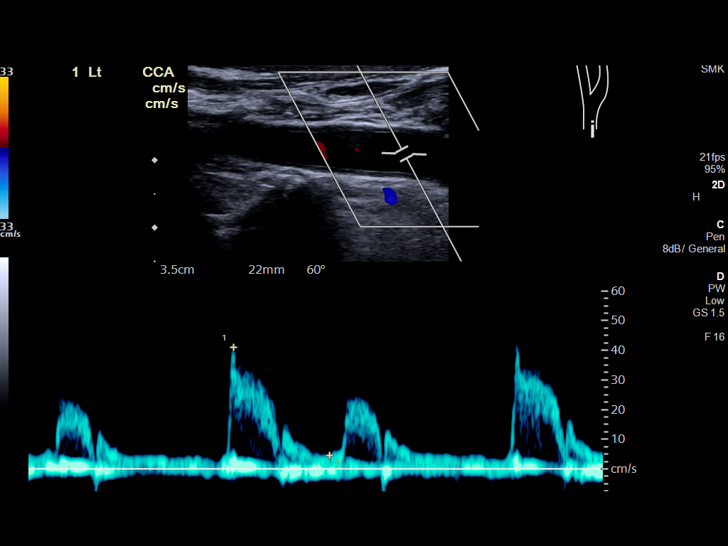
[im 40/62]
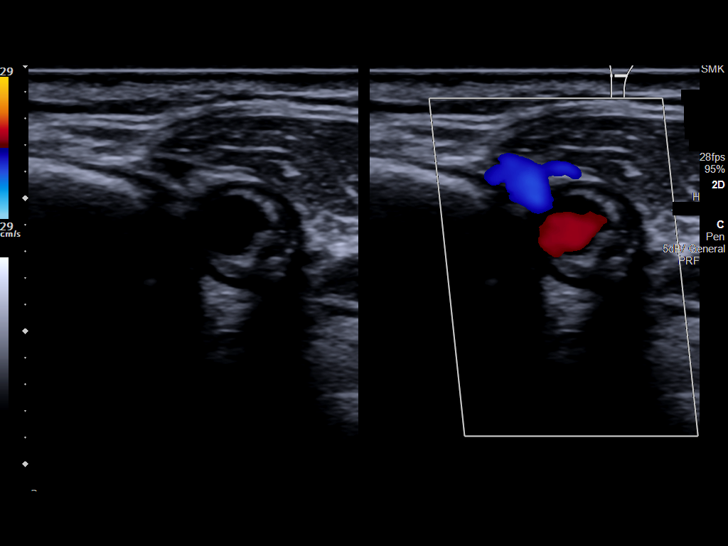
[im 46/62]
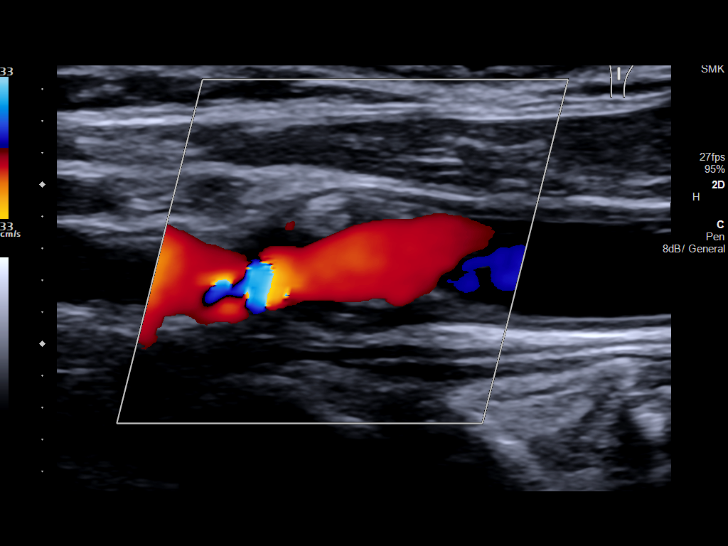
[im 51/62]
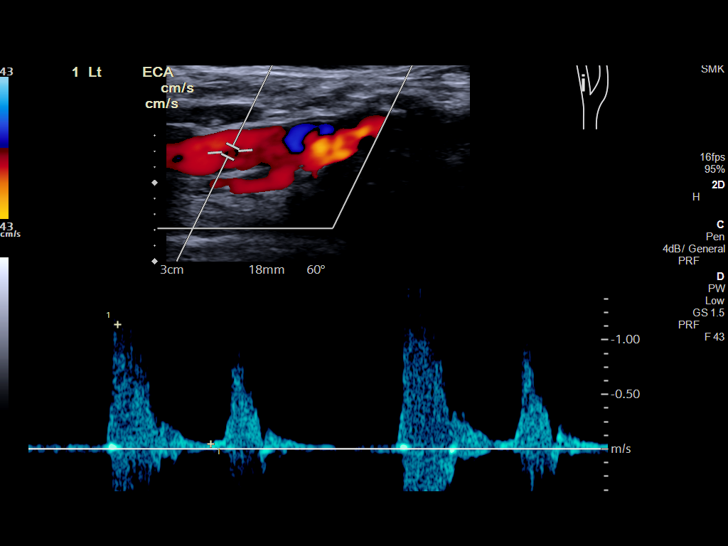
[im 56/62]
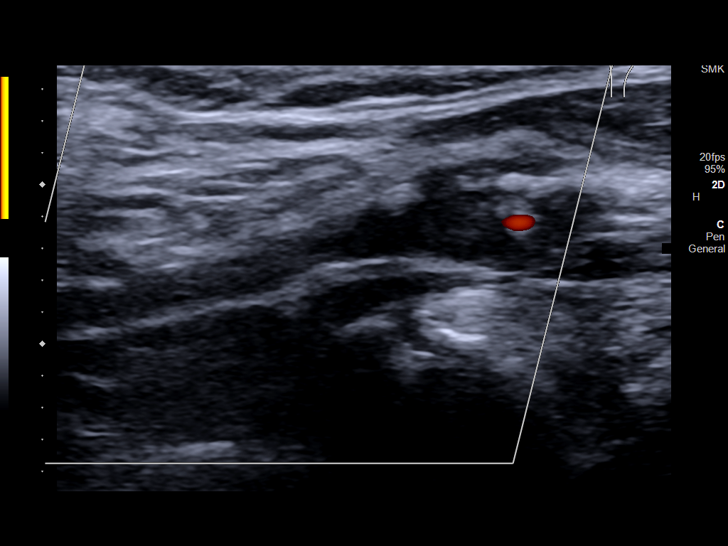
[im 62/62]
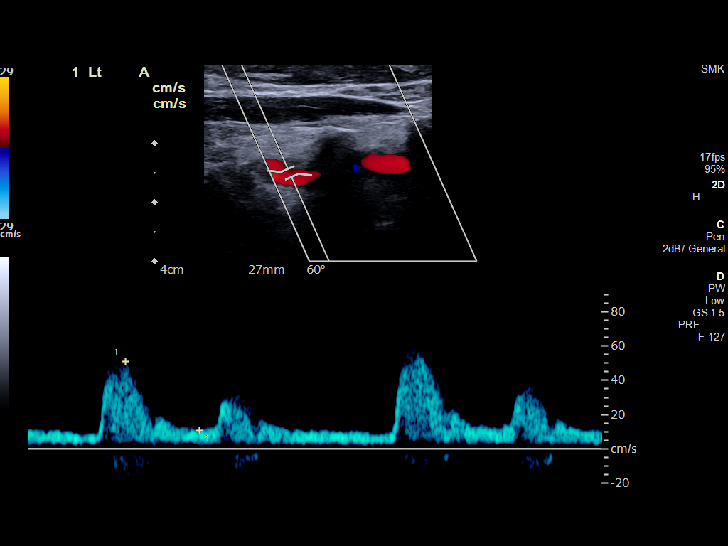

[13 of 24 positions shown; findings below may reference images not displayed]

FINDINGS: Criteria: Quantification of carotid stenosis is based on velocity
parameters that correlate the residual internal carotid diameter
with NASCET-based stenosis levels, using the diameter of the distal
internal carotid lumen as the denominator for stenosis measurement.

The following velocity measurements were obtained:

RIGHT

ICA:  Systolic 229 cm/sec, Diastolic 66 cm/sec

CCA:  93 cm/sec

SYSTOLIC ICA/CCA RATIO:

ECA:  187 cm/sec

LEFT

ICA: Left ICA occlusion

CCA:  42 cm/sec

SYSTOLIC ICA/CCA RATIO:  Not calculable

ECA:  114 cm/sec

Right Brachial SBP: Not acquired

Left Brachial SBP: Not acquired

RIGHT CAROTID ARTERY: No significant calcifications of the right
common carotid artery. Intermediate waveform maintained. Moderate
heterogeneous and partially calcified plaque at the right carotid
bifurcation. No significant lumen shadowing. Low resistance waveform
of the right ICA. No significant tortuosity.

RIGHT VERTEBRAL ARTERY: Antegrade flow with low resistance waveform.

LEFT CAROTID ARTERY: No significant flow in the left internal
carotid artery, with trickle flow within the lumen. Atherosclerotic
calcifications at the bifurcation.

LEFT VERTEBRAL ARTERY:  Antegrade flow with low resistance waveform.
IMPRESSION: Right:

Heterogeneous and partially calcified plaque contributes to 50%-69%
stenosis by established duplex criteria.

Left:

Left ICA occlusion, indeterminate chronicity.

The case and results were discussed telephone at the time of
interpretation on [DATE] at [DATE] with Dr. WILSON GUSTAVO.

## 2018-03-16 IMAGING — CT CT ANGIO HEAD
1 of 11 series · 5 of 33 positions shown · IV contrast (APPLIED)
Comparison: Carotid Doppler ultrasound [DATE]

CLINICAL DATA: Two near syncopal episodes. Right ICA stenosis and
left ICA occlusion on ultrasound.

EXAM:
CT ANGIOGRAPHY HEAD AND NECK
TECHNIQUE: Multidetector CT imaging of the head and neck was performed using
the standard protocol during bolus administration of intravenous
contrast. Multiplanar CT image reconstructions and MIPs were
obtained to evaluate the vascular anatomy. Carotid stenosis
measurements (when applicable) are obtained utilizing NASCET
criteria, using the distal internal carotid diameter as the
denominator.
CONTRAST:  75mL OMNIPAQUE IOHEXOL 350 MG/ML SOLN

[Series 10: ax thin · axial · 0.44mm/px · z∈[-354,-120]mm · 5 of 353 slices shown]
[im 59/353  soft-tissue]
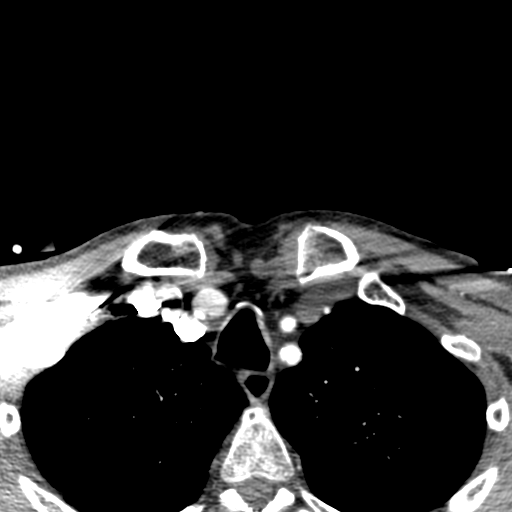
[im 118/353  bone]
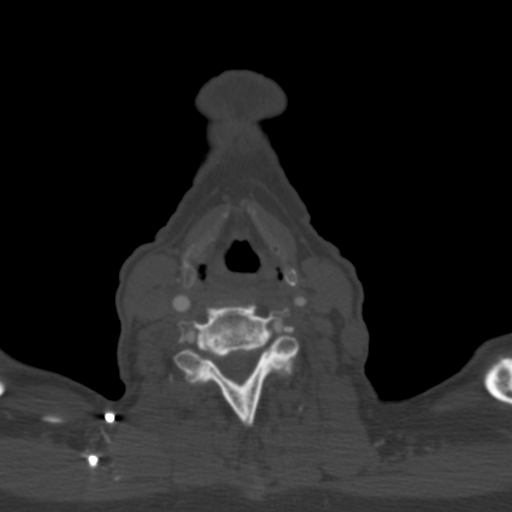
[im 177/353  soft-tissue]
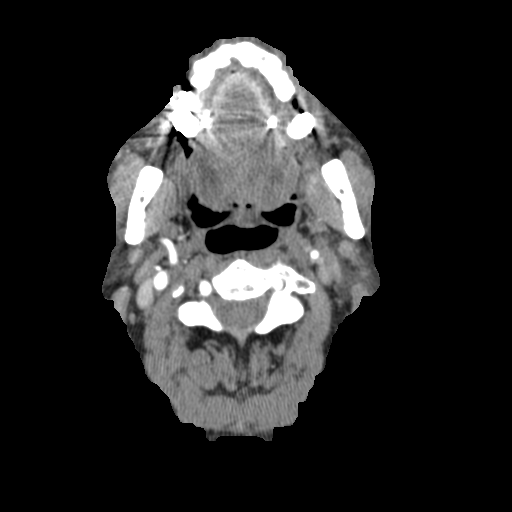
[im 235/353  bone]
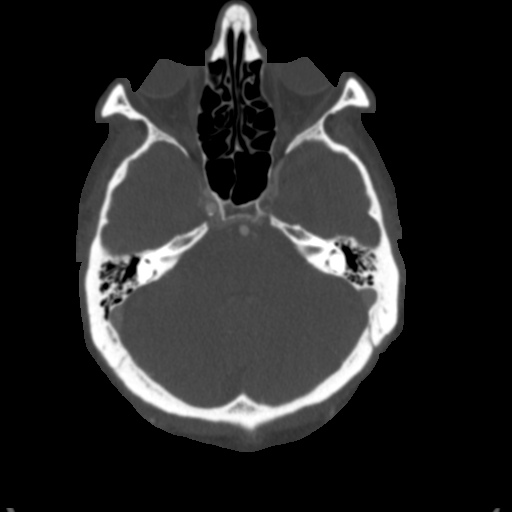
[im 294/353  soft-tissue]
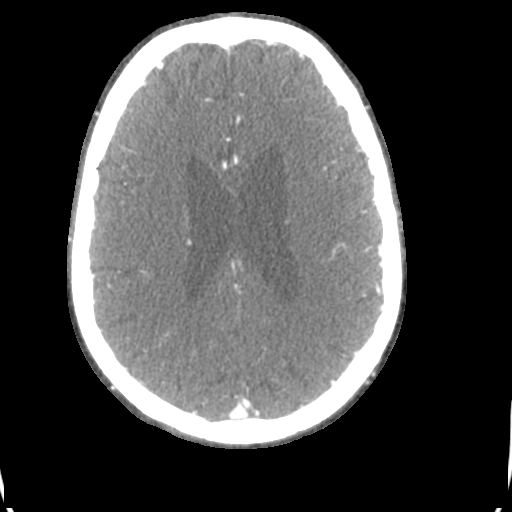

[5 of 33 positions shown; findings below may reference images not displayed]

FINDINGS: CT HEAD FINDINGS

Brain: There is no evidence of acute infarct, intracranial
hemorrhage, mass, midline shift, or extra-axial fluid collection.
Mild cerebral atrophy is within normal limits for age.
Periventricular white matter hypodensities are nonspecific but
compatible with minimal chronic small vessel ischemic disease, also
considered to be within normal limits for age.

Vascular: Calcified atherosclerosis at the skull base.

Skull: No fracture or focal osseous lesion.

Sinuses: Visualized paranasal sinuses and mastoid air cells are
clear.

Orbits: Bilateral cataract extraction.

Review of the MIP images confirms the above findings

CTA NECK FINDINGS

Aortic arch: Standard 3 vessel aortic arch with diffuse
atherosclerotic plaque. Calcified and soft plaque at the arch vessel
origins without significant stenosis.

Right carotid system: Patent with prominent calcified plaque at the
carotid bifurcation resulting in 60% proximal ICA stenosis.

Left carotid system: Patent common carotid artery with soft plaque
in its midportion resulting in less than 50% stenosis. Extensive
calcified and soft plaque at the carotid bifurcation with occlusion
of the ICA at its origin, severe stenosis of the ECA origin, and 60%
stenosis of the common carotid artery just proximal to the
bifurcation.

Vertebral arteries: Patent and codominant without stenosis.

Skeleton: Moderate to severe diffuse cervical disc degeneration.

Other neck: No mass or enlarged lymph nodes.

Upper chest: Severe centrilobular emphysema.

Review of the MIP images confirms the above findings

CTA HEAD FINDINGS

Anterior circulation: The right ICA is patent from skull base to
terminus with mild nonstenotic atherosclerosis. There is
reconstitution of the left supraclinoid ICA via the posterior
communicating artery. ACAs and MCAs are patent without evidence of
proximal branch occlusion or significant proximal stenosis. No
aneurysm is identified.

Posterior circulation: The intracranial vertebral arteries are
widely patent to the basilar. Patent PICA, AICA, and SCA origins are
visualized bilaterally. The basilar artery is widely patent. There
are left larger than right posterior communicating arteries. The
PCAs are patent without evidence of significant proximal stenosis.
No aneurysm is identified.

Venous sinuses: Patent.

Anatomic variants: None.

Delayed phase: No abnormal enhancement.

Review of the MIP images confirms the above findings
IMPRESSION: 1. No evidence of acute intracranial abnormality.
2. Left ICA occlusion at its origin with intracranial
reconstitution.
3. 60% proximal right ICA stenosis.
4. Mild intracranial atherosclerosis without significant stenosis.
5. Aortic Atherosclerosis ([1J]-[1J]) and Emphysema ([1J]-[1J]).

## 2018-03-16 MED ORDER — CLOPIDOGREL BISULFATE 75 MG PO TABS
75.0000 mg | ORAL_TABLET | Freq: Every day | ORAL | Status: DC
  Administered 2018-03-16 – 2018-03-17 (×2): 75 mg via ORAL
  Filled 2018-03-16 (×2): qty 1

## 2018-03-16 MED ORDER — IOHEXOL 350 MG/ML SOLN
75.0000 mL | Freq: Once | INTRAVENOUS | Status: AC | PRN
  Administered 2018-03-16: 75 mL via INTRAVENOUS

## 2018-03-16 NOTE — Consult Note (Signed)
[facility] Vascular Consult Note  MRN : [id number]  Eric Li is a 83 y.o. (August 02, 1935) male who presents with chief complaint of  Chief Complaint  Patient presents with  . Near Syncope  . Fall  .  History of Present Illness:   I am asked to evaluate the patient by Dr. Benjie Karvonen.  Patient is an 83 year old gentleman who presented to the hospital yesterday after a syncopal event.  Based on his description he was looking out his living room window as he turned he may began to feel lightheaded and then seemed to become globally weak perhaps more so on the left side.  Although he did go to ground he claims he did not lose consciousness and was able to pull a pillow from a chair down to the floor so he could rest his head and collect himself.  Later after trying to get out of a chair he felt  dizzy again and at this point decided to come to the hospital.  Patient denies any focal motor deficit recent or in the past.  He denies amaurosis fugax or other visual changes.  He denies any past history of stroke.  There is no past history of vertebrobasilar disease.  He does not have a history of vertigo or other inner ear pathology.  He does not have a history of cardiac arrhythmias but he does follow with Dr. Saralyn Pilar.  He had been taking a baby aspirin on a daily basis but recently had stopped this because of bruising.  Current Facility-Administered Medications  Medication Dose Route Frequency Provider Last Rate Last Dose  . acetaminophen (TYLENOL) tablet 650 mg  650 mg Oral Q6H PRN Gouru, Aruna, MD       Or  . acetaminophen (TYLENOL) suppository 650 mg  650 mg Rectal Q6H PRN Gouru, Aruna, MD      . albuterol (PROVENTIL) (2.5 MG/3ML) 0.083% nebulizer solution 3 mL  3 mL Inhalation Q6H PRN Gouru, Aruna, MD      . clopidogrel (PLAVIX) tablet 75 mg  75 mg Oral Daily Alexis Goodell, MD   75 mg at 03/16/18 1624  . docusate sodium (COLACE) capsule 100 mg  100 mg Oral BID  Gouru, Aruna, MD   100 mg at 03/16/18 0850  . enoxaparin (LOVENOX) injection 40 mg  40 mg Subcutaneous Q24H Gouru, Aruna, MD   40 mg at 03/15/18 2237  . ezetimibe (ZETIA) tablet 10 mg  10 mg Oral Once per day on Mon Wed Fri Gouru, Aruna, MD   10 mg at 03/15/18 2238  . fluticasone (FLONASE) 50 MCG/ACT nasal spray 2 spray  2 spray Each Nare Daily Gouru, Aruna, MD   2 spray at 03/16/18 0851  . hydrocortisone (ANUSOL-HC) 2.5 % rectal cream 1 application  1 application Topical BID Gouru, Aruna, MD      . ipratropium (ATROVENT) 0.03 % nasal spray 2 spray  2 spray Each Nare TID PRN Gouru, Aruna, MD      . metoprolol succinate (TOPROL-XL) 24 hr tablet 25 mg  25 mg Oral Daily Gouru, Aruna, MD   25 mg at 03/16/18 0851  . multivitamin with minerals tablet 1 tablet  1 tablet Oral Daily Gouru, Aruna, MD   1 tablet at 03/16/18 0850  . ondansetron (ZOFRAN) tablet 4 mg  4 mg Oral Q6H PRN Gouru, Aruna, MD       Or  . ondansetron (ZOFRAN) injection 4 mg  4 mg Intravenous Q6H PRN Nicholes Mango, MD      .  pantoprazole (PROTONIX) EC tablet 40 mg  40 mg Oral Daily Gouru, Aruna, MD   40 mg at 03/16/18 0850  . polycarbophil (FIBERCON) tablet 625 mg  625 mg Oral QPM Gouru, Aruna, MD   625 mg at 03/16/18 1756  . sodium chloride flush (NS) 0.9 % injection 3 mL  3 mL Intravenous Q12H Gouru, Aruna, MD   3 mL at 03/15/18 2238  . tiotropium (SPIRIVA) inhalation capsule 18 mcg  18 mcg Inhalation Daily Gouru, Aruna, MD   18 mcg at 03/16/18 0851  . vitamin B-12 (CYANOCOBALAMIN) tablet 1,000 mcg  1,000 mcg Oral Daily Gouru, Aruna, MD   1,000 mcg at 03/16/18 0850  . zolpidem (AMBIEN) tablet 5 mg  5 mg Oral QHS PRN Nicholes Mango, MD   5 mg at 03/15/18 2238    Past Medical History:  Diagnosis Date  . Bilateral hearing loss 03/03/2016  . CAD (coronary artery disease) 02/24/2014  . COPD, moderate ([place]) 08/29/2015  . Hemorrhoids 02/24/2014  . HTN (hypertension) 02/24/2014  . Hyperlipidemia, unspecified 02/24/2014  . Nephrolithiasis   . SOB  (shortness of breath) on exertion 03/27/2015  . Statin intolerance 03/30/2015    Past Surgical History:  Procedure Laterality Date  . CHOLECYSTECTOMY    . TONSILLECTOMY      Social History Social History   Tobacco Use  . Smoking status: Former Research scientist (life sciences)  . Smokeless tobacco: Never Used  Substance Use Topics  . Alcohol use: No  . Drug use: No    Family History Family History  Problem Relation Age of Onset  . Bladder Cancer Neg Hx   . Prolactinoma Neg Hx   . Prostate cancer Neg Hx   . Kidney cancer Neg Hx   No family history of bleeding/clotting disorders, porphyria or autoimmune disease   No Known Allergies   REVIEW OF SYSTEMS (Negative unless checked)  Constitutional: [] Weight loss  [] Fever  [] Chills Cardiac: [] Chest pain   [] Chest pressure   [] Palpitations   [] Shortness of breath when laying flat   [] Shortness of breath at rest   [] Shortness of breath with exertion. Vascular:  [] Pain in legs with walking   [] Pain in legs at rest   [] Pain in legs when laying flat   [] Claudication   [] Pain in feet when walking  [] Pain in feet at rest  [] Pain in feet when laying flat   [] History of DVT   [] Phlebitis   [] Swelling in legs   [] Varicose veins   [] Non-healing ulcers Pulmonary:   [] Uses home oxygen   [] Productive cough   [] Hemoptysis   [] Wheeze  [x] COPD   [] Asthma Neurologic:  [x] Dizziness  [] Blackouts   [] Seizures   [] History of stroke   [] History of TIA  [] Aphasia   [] Temporary blindness   [] Dysphagia   [] Weakness or numbness in arms   [] Weakness or numbness in legs Musculoskeletal:  [] Arthritis   [] Joint swelling   [] Joint pain   [] Low back pain Hematologic:  [] Easy bruising  [] Easy bleeding   [] Hypercoagulable state   [] Anemic  [] Hepatitis Gastrointestinal:  [] Blood in stool   [] Vomiting blood  [] Gastroesophageal reflux/heartburn   [] Difficulty swallowing. Genitourinary:  [] Chronic kidney disease   [] Difficult urination  [] Frequent urination  [] Burning with urination   [] Blood in  urine Skin:  [] Rashes   [] Ulcers   [] Wounds Psychological:  [] History of anxiety   []  History of major depression.    Physical Examination  Vitals:   03/16/18 0734 03/16/18 0917 03/16/18 1607 03/16/18 1928  BP: 104/66 129/73  128/63 (!) 130/55  Pulse: 82  63 64  Resp:   (!) 24 18  Temp:   98.3 F (36.8 C) 98 F (36.7 C)  TempSrc:   Oral   SpO2: 93%  96% 98%  Weight:      Height:       Body mass index is 21.05 kg/m.  Head: [place]/AT, No temporalis wasting. Prominent temp pulse not noted. Ear/Nose/Throat: Nares w/o erythema or drainage, oropharynx w/o obsrtuction, Mallampati score: 3.  Dentition patient wears dentures sometimes.  Eyes: PERRLA, Sclera nonicteric.  Neck: Supple, no nuchal rigidity.  No bruit or JVD.  Pulmonary:  Breath sounds equal bilaterally, no use of accessory muscles.  Cardiac: RRR, normal S1, S2, no Murmurs, rubs or gallops. Vascular: Bilateral carotid bruits Gastrointestinal: soft, non-tender, non-distended.  Musculoskeletal: Moves all extremities.  No deformity or atrophy. No edema. Neurologic: CN 2-12 intact. Symmetrical.  Speech is fluent.  Psychiatric: Judgment intact, Mood & affect appropriate for pt's clinical situation. Dermatologic: No rashes or ulcers noted.  No cellulitis or open wounds. Lymph : No Cervical,  or Inguinal lymphadenopathy.      CBC Lab Results  Component Value Date   WBC 9.0 03/15/2018   HGB 16.1 03/15/2018   HCT 47.8 03/15/2018   MCV 93.5 03/15/2018   PLT 196 03/15/2018    BMET    Component Value Date/Time   NA 139 03/15/2018 1730   K 3.9 03/15/2018 1730   CL 107 03/15/2018 1730   CO2 26 03/15/2018 1730   GLUCOSE 98 03/15/2018 1730   BUN 10 03/15/2018 1730   CREATININE 0.84 03/15/2018 1730   CALCIUM 9.0 03/15/2018 1730   GFRNONAA >60 03/15/2018 1730   GFRAA >60 03/15/2018 1730   Estimated Creatinine Clearance: 56 mL/min (by C-G formula based on SCr of 0.84 mg/dL).  COAG No results found for: INR,  PROTIME  Radiology CT angios performed earlier today is reviewed by myself: The left internal carotid is occluded and appears to reconstitute just above the siphon.  The right internal carotid artery shows a 60% stenosis moderately calcified.  No significant intracranial lesions are noted.  Vertebral arteries are both patent as is the basilar artery and the posterior circulation.   Assessment/Plan 1.  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  I would continue work-up of his syncopal event including evaluation by ear nose and throat and continued cardiac evaluation with Dr. Saralyn Pilar.  Given the patient's intolerance of aspirin was bruising I would restart the baby aspirin and add Plavix.  If the bruising indeed becomes excessive we can return to monotherapy but at this point the patient should at the very least be on one antiplatelet agent.  I would recommend follow-up at six-month intervals with ultrasound to monitor the right internal carotid artery.  I would see him back in the office in 2 to 3 weeks to see how he is doing with his antiplatelet therapy.  Should he have a focal motor deficit or an episode of amaurosis fugax of the right eye I would reconsider surgical treatment or stenting of the right internal carotid artery especially given the occlusion on the left.  However, at this point there does not seem to be evidence that the episode that occurred Sunday was indeed a TIA it appears to have been more of a global event consistent with syncope of  a nonvascular cause.  2.  COPD:  stable breathing continue home aerosols additional treatments as needed  3.  Coronary artery disease: continue Zetia; may need to hold beta-blocker if this was an underlying etiology of his syncope.  I defer to cardiology for their opinion  4.   Hypertension: may need to hold beta-blocker if this was an underlying etiology of his syncope.  I defer to cardiology for their opinion   5.  Hyperlipidemia: Continue Zetia     Hortencia Pilar, MD  03/16/2018 8:04 PM

## 2018-03-16 NOTE — Evaluation (Signed)
Physical Therapy Evaluation Patient Details Name: Eric Li MRN: [id number] DOB: [birth year] Today's Date: 03/16/2018   History of Present Illness  Patient is an 83 year old male admitted s/p syncope and fall.  PMH includes HOH, CAD, COPD, Htn and HLD.  Clinical Impression  Patient is an 83 year old male who lives in a one story home with his wife.  He is independent without use of AD at baseline.  Pt in chair upon PT arrival and able to perform STS transfer without assistance.  Pt able to ambulate in room without AD appearing steady on feet and with no LOB's.  Strength and coordination testing reveals no deficits.  Pt is functioning at baseline and does not require skilled PT services at this time.    Follow Up Recommendations No PT follow up    Equipment Recommendations  None recommended by PT    Recommendations for Other Services       Precautions / Restrictions Precautions Precautions: Fall Restrictions Weight Bearing Restrictions: No      Mobility  Bed Mobility Overal bed mobility: (In chair)                Transfers Overall transfer level: Independent                  Ambulation/Gait Ambulation/Gait assistance: Supervision Gait Distance (Feet): 50 Feet       Gait velocity interpretation: 1.31 - 2.62 ft/sec, indicative of limited community ambulator General Gait Details: Steady gait, good foot clearance, no use of AD.  Stairs            Wheelchair Mobility    Modified Rankin (Stroke Patients Only)       Balance Overall balance assessment: Independent                                           Pertinent Vitals/Pain Pain Assessment: No/denies pain    Home Living Family/patient expects to be discharged to:: Private residence Living Arrangements: Spouse/significant other Available Help at Discharge: Family;Available 24 hours/day(pt's daughter is a Marine scientist.) Type of Home: House Home Access: Stairs to  enter Entrance Stairs-Rails: Can reach both Entrance Stairs-Number of Steps: 3 Home Layout: One level Home Equipment: Cane - single point      Prior Function Level of Independence: Independent         Comments: Does not use cane available.  Is able to perform daily activity but states that he can't work as much as he used to.     Hand Dominance        Extremity/Trunk Assessment   Upper Extremity Assessment Upper Extremity Assessment: Overall WFL for tasks assessed    Lower Extremity Assessment Lower Extremity Assessment: Overall WFL for tasks assessed    Cervical / Trunk Assessment Cervical / Trunk Assessment: Normal  Communication   Communication: No difficulties  Cognition Arousal/Alertness: Awake/alert Behavior During Therapy: WFL for tasks assessed/performed Overall Cognitive Status: Within Functional Limits for tasks assessed                                 General Comments: A&O x4.  Follows commands consistenly.      General Comments      Exercises     Assessment/Plan    PT Assessment Patent does not need any further PT  services  PT Problem List         PT Treatment Interventions      PT Goals (Current goals can be found in the Care Plan section)  Acute Rehab PT Goals PT Goal Formulation: All assessment and education complete, DC therapy    Frequency     Barriers to discharge        Co-evaluation               AM-PAC PT "6 Clicks" Daily Activity  Outcome Measure Difficulty turning over in bed (including adjusting bedclothes, sheets and blankets)?: None Difficulty moving from lying on back to sitting on the side of the bed? : None Difficulty sitting down on and standing up from a chair with arms (e.g., wheelchair, bedside commode, etc,.)?: None Help needed moving to and from a bed to chair (including a wheelchair)?: None Help needed walking in hospital room?: None Help needed climbing 3-5 steps with a railing? :  None 6 Click Score: 24    End of Session Equipment Utilized During Treatment: Gait belt Activity Tolerance: Patient tolerated treatment well Patient left: in chair;with call bell/phone within reach;with chair alarm set Nurse Communication: Mobility status PT Visit Diagnosis: History of falling (Z91.81)    Time: 1188-6773 PT Time Calculation (min) (ACUTE ONLY): 25 min   Charges:   PT Evaluation $PT Eval Low Complexity: 1 Low     PT G Codes:   PT G-Codes **NOT FOR INPATIENT CLASS** Functional Assessment Tool Used: AM-PAC 6 Clicks Basic Mobility    Roxanne Gates, PT, DPT  Roxanne Gates 03/16/2018, 9:31 AM

## 2018-03-16 NOTE — Progress Notes (Signed)
I received a call from the radiologist.  Patient has occluded ICA Recommendation for neurology consultation.  I spoke with Dr. Doy Mince who will see patient in consultation.  I discussed findings of carotid ultrasound with patient's wife.  Patient was not in the room at the time. Plan for CTA head and neck

## 2018-03-16 NOTE — Discharge Summary (Deleted)
[facility] [place] NAME: Eric Li    MR#:  [id number]  DATE OF BIRTH:  May 04, 1935  DATE OF ADMISSION:  03/15/2018 ADMITTING PHYSICIAN: Nicholes Mango, MD  DATE OF DISCHARGE: 03/16/2018  PRIMARY CARE PHYSICIAN: [name], MD    ADMISSION DIAGNOSIS:  Near syncope [R55]  DISCHARGE DIAGNOSIS:  Active Problems:   Syncope   SECONDARY DIAGNOSIS:   Past Medical History:  Diagnosis Date  . Bilateral hearing loss 03/03/2016  . CAD (coronary artery disease) 02/24/2014  . COPD, moderate ([place]) 08/29/2015  . Hemorrhoids 02/24/2014  . HTN (hypertension) 02/24/2014  . Hyperlipidemia, unspecified 02/24/2014  . Nephrolithiasis   . SOB (shortness of breath) on exertion 03/27/2015  . Statin intolerance 03/30/2015    HOSPITAL COURSE:   83 year old male with history of essential hypertension, CAD and COPD who presented with syncope.  1.  Syncope: This is likely vasovagal.  Patient experienced syncopal episodes while turning his head.  He underwent echocardiogram and carotid Doppler. Telemetry showed no abnormal rhythm.  Troponins were negative.  Patient will have follow-up with cardiology and should be considered possibly for 2-week event recorder.  2.  COPD without signs of exacerbation Continue inhalers 3.  Essential hypertension: Patient will continue metoprolol  4.  Hyperlipidemia: Continue Zetia.  Patient is intolerant to statins.  DISCHARGE CONDITIONS AND DIET:   Stable for discharge on heart healthy diet  CONSULTS OBTAINED:    DRUG ALLERGIES:  No Known Allergies  DISCHARGE MEDICATIONS:   Allergies as of 03/16/2018   No Known Allergies     Medication List    TAKE these medications   albuterol 108 (90 Base) MCG/ACT inhaler Commonly known as:  PROVENTIL HFA;VENTOLIN HFA Inhale 2 puffs into the lungs every 6 (six) hours as needed for wheezing or shortness of breath.   ezetimibe 10 MG tablet Commonly known as:  ZETIA Take 10  mg by mouth 3 (three) times a week.   fluticasone 50 MCG/ACT nasal spray Commonly known as:  FLONASE Place 2 sprays into both nostrils daily.   hydrocortisone 2.5 % cream Apply 1 application topically 2 (two) times daily.   ipratropium 0.03 % nasal spray Commonly known as:  ATROVENT Place 2 sprays into both nostrils 3 (three) times daily as needed for rhinitis.   metoprolol succinate 25 MG 24 hr tablet Commonly known as:  TOPROL-XL Take 25 mg by mouth daily.   mometasone 0.1 % lotion Commonly known as:  ELOCON Instill 4 drops in each ear at bedtime as needed for dryness or itch   multivitamin with minerals Tabs tablet Take 1 tablet by mouth daily.   omeprazole 20 MG capsule Commonly known as:  PRILOSEC Take 20 mg by mouth daily.   polycarbophil 625 MG tablet Commonly known as:  FIBERCON Take 625 mg by mouth daily.   SPIRIVA HANDIHALER 18 MCG inhalation capsule Generic drug:  tiotropium Place 18 mcg into inhaler and inhale daily.   vitamin B-12 1000 MCG tablet Commonly known as:  CYANOCOBALAMIN Take 1,000 mcg by mouth daily.   zolpidem 10 MG tablet Commonly known as:  AMBIEN Take 10 mg by mouth at bedtime as needed for sleep.         Today   CHIEF COMPLAINT:  Patient doing well this morning.  Ambulated with physical therapy without any issues.   VITAL SIGNS:  Blood pressure 129/73, pulse 82, temperature 97.9 F (36.6 C), temperature source Oral, resp. rate (!) 24, height 5\' 5"  (  1.651 m), weight 57.4 kg (126 lb 8 oz), SpO2 93 %.   REVIEW OF SYSTEMS:  Review of Systems  Constitutional: Negative.  Negative for chills, fever and malaise/fatigue.  HENT: Negative.  Negative for ear discharge, ear pain, hearing loss, nosebleeds and sore throat.   Eyes: Negative.  Negative for blurred vision and pain.  Respiratory: Negative.  Negative for cough, hemoptysis, shortness of breath and wheezing.   Cardiovascular: Negative.  Negative for chest pain, palpitations  and leg swelling.  Gastrointestinal: Negative.  Negative for abdominal pain, blood in stool, diarrhea, nausea and vomiting.  Genitourinary: Negative.  Negative for dysuria.  Musculoskeletal: Negative.  Negative for back pain.  Skin: Negative.   Neurological: Negative for dizziness, tremors, speech change, focal weakness, seizures and headaches.  Endo/Heme/Allergies: Negative.  Does not bruise/bleed easily.  Psychiatric/Behavioral: Negative.  Negative for depression, hallucinations and suicidal ideas.     PHYSICAL EXAMINATION:  GENERAL:  83 y.o.-year-old patient lying in the bed with no acute distress.  NECK:  Supple, no jugular venous distention. No thyroid enlargement, no tenderness.  LUNGS: Normal breath sounds bilaterally, no wheezing, rales,rhonchi  No use of accessory muscles of respiration.  CARDIOVASCULAR: S1, S2 normal. No murmurs, rubs, or gallops.  ABDOMEN: Soft, non-tender, non-distended. Bowel sounds present. No organomegaly or mass.  EXTREMITIES: No pedal edema, cyanosis, or clubbing.  PSYCHIATRIC: The patient is alert and oriented x 3.  SKIN: No obvious rash, lesion, or ulcer.   DATA REVIEW:   CBC Recent Labs  Lab 03/15/18 1730  WBC 9.0  HGB 16.1  HCT 47.8  PLT 196    Chemistries  Recent Labs  Lab 03/15/18 1730  NA 139  K 3.9  CL 107  CO2 26  GLUCOSE 98  BUN 10  CREATININE 0.84  CALCIUM 9.0  AST 25  ALT 18  ALKPHOS 73  BILITOT 0.9    Cardiac Enzymes Recent Labs  Lab 03/15/18 1730 03/15/18 2215 03/16/18 0410  TROPONINI <0.03 <0.03 <0.03    Microbiology Results  @[email or web address] @  RADIOLOGY:  Dg Chest Port 1 View  Result Date: 03/15/2018 CLINICAL DATA:  Weakness, fall yesterday. Intermittent dizziness. History of coronary artery disease, hypertension, heart failure. EXAM: PORTABLE CHEST 1 VIEW COMPARISON:  None. FINDINGS: Heart size and mediastinal contours are within normal limits. Atherosclerotic changes noted at the aortic arch. Lungs are  clear. No pleural effusion or pneumothorax seen. No acute or suspicious osseous finding. IMPRESSION: 1. No active disease. No evidence of pneumonia or pulmonary edema. No osseous fracture or dislocation seen. 2. Aortic atherosclerosis. Electronically Signed   By: Franki Cabot M.D.   On: 03/15/2018 19:18      Allergies as of 03/16/2018   No Known Allergies     Medication List    TAKE these medications   albuterol 108 (90 Base) MCG/ACT inhaler Commonly known as:  PROVENTIL HFA;VENTOLIN HFA Inhale 2 puffs into the lungs every 6 (six) hours as needed for wheezing or shortness of breath.   ezetimibe 10 MG tablet Commonly known as:  ZETIA Take 10 mg by mouth 3 (three) times a week.   fluticasone 50 MCG/ACT nasal spray Commonly known as:  FLONASE Place 2 sprays into both nostrils daily.   hydrocortisone 2.5 % cream Apply 1 application topically 2 (two) times daily.   ipratropium 0.03 % nasal spray Commonly known as:  ATROVENT Place 2 sprays into both nostrils 3 (three) times daily as needed for rhinitis.   metoprolol succinate 25 MG 24 hr tablet  Commonly known as:  TOPROL-XL Take 25 mg by mouth daily.   mometasone 0.1 % lotion Commonly known as:  ELOCON Instill 4 drops in each ear at bedtime as needed for dryness or itch   multivitamin with minerals Tabs tablet Take 1 tablet by mouth daily.   omeprazole 20 MG capsule Commonly known as:  PRILOSEC Take 20 mg by mouth daily.   polycarbophil 625 MG tablet Commonly known as:  FIBERCON Take 625 mg by mouth daily.   SPIRIVA HANDIHALER 18 MCG inhalation capsule Generic drug:  tiotropium Place 18 mcg into inhaler and inhale daily.   vitamin B-12 1000 MCG tablet Commonly known as:  CYANOCOBALAMIN Take 1,000 mcg by mouth daily.   zolpidem 10 MG tablet Commonly known as:  AMBIEN Take 10 mg by mouth at bedtime as needed for sleep.          Management plans discussed with the patient and he is in agreement. Stable for  discharge home  Patient should follow up with pcp  CODE STATUS:     Code Status Orders  (From admission, onward)        Start     Ordered   03/15/18 2050  Full code  Continuous     03/15/18 2049    Code Status History    This patient has a current code status but no historical code status.    Advance Directive Documentation     Most Recent Value  Type of Advance Directive  Living will  Pre-existing out of facility DNR order (yellow form or pink MOST form)  -  "MOST" Form in Place?  -      TOTAL TIME TAKING CARE OF THIS PATIENT: 38 minutes.    Note: This dictation was prepared with Dragon dictation along with smaller phrase technology. Any transcriptional errors that result from this process are unintentional.  Brylie Sneath M.D on 03/16/2018 at 9:53 AM  Between 7am to 6pm - Pager - [phone] After 6pm go to [email or web address] - password [email or web address]  Office  [phone]  CC: Primary care physician; [name], MD

## 2018-03-16 NOTE — Consult Note (Signed)
Reason for Consult:Occluded ICA Referring Physician: Mody  CC: Syncope  HPI: TEDRICK PORT is an 83 y.o. male with a history of COPD and CHF who reports that on Sunday while looking out of the window he began to turn to the left.  Patient then had a sensation travel down his left side.  His left side "went out on him" and then his entire body became weak and he fell to the floor.  He does not recall losing consciousness.  Reports that he laid there for a few minutes and was able to get up.  Did fine until yesterday when he attempted to get up from a chair.  Felt lightheaded and sat back down.  Spoke with his physician who recommended admission.  Patient has felt fine since admission.     Past Medical History:  Diagnosis Date  . Bilateral hearing loss 03/03/2016  . CAD (coronary artery disease) 02/24/2014  . COPD, moderate ([place]) 08/29/2015  . Hemorrhoids 02/24/2014  . HTN (hypertension) 02/24/2014  . Hyperlipidemia, unspecified 02/24/2014  . Nephrolithiasis   . SOB (shortness of breath) on exertion 03/27/2015  . Statin intolerance 03/30/2015    Past Surgical History:  Procedure Laterality Date  . CHOLECYSTECTOMY    . TONSILLECTOMY      Family History  Problem Relation Age of Onset  . Bladder Cancer Neg Hx   . Prolactinoma Neg Hx   . Prostate cancer Neg Hx   . Kidney cancer Neg Hx     Social History:  reports that he has quit smoking. He has never used smokeless tobacco. He reports that he does not drink alcohol or use drugs.  No Known Allergies  Medications:  I have reviewed the patient's current medications. Prior to Admission:  Medications Prior to Admission  Medication Sig Dispense Refill Last Dose  . albuterol (PROVENTIL HFA;VENTOLIN HFA) 108 (90 Base) MCG/ACT inhaler Inhale 2 puffs into the lungs every 6 (six) hours as needed for wheezing or shortness of breath.    PRN at PRN  . ezetimibe (ZETIA) 10 MG tablet Take 10 mg by mouth 3 (three) times a week.    As directed at As  directed  . fluticasone (FLONASE) 50 MCG/ACT nasal spray Place 2 sprays into both nostrils daily.   03/14/2018 at Unknown time  . hydrocortisone 2.5 % cream Apply 1 application topically 2 (two) times daily.   03/14/2018 at Unknown time  . ipratropium (ATROVENT) 0.03 % nasal spray Place 2 sprays into both nostrils 3 (three) times daily as needed for rhinitis.   PRN at PRN  . metoprolol succinate (TOPROL-XL) 25 MG 24 hr tablet Take 25 mg by mouth daily.    03/15/2018 at 0800  . mometasone (ELOCON) 0.1 % lotion Instill 4 drops in each ear at bedtime as needed for dryness or itch   PRN at PRN  . Multiple Vitamin (MULTIVITAMIN WITH MINERALS) TABS tablet Take 1 tablet by mouth daily.   03/14/2018 at 2000  . omeprazole (PRILOSEC) 20 MG capsule Take 20 mg by mouth daily.   03/15/2018 at 0800  . polycarbophil (FIBERCON) 625 MG tablet Take 625 mg by mouth daily.   03/14/2018 at 2000  . SPIRIVA HANDIHALER 18 MCG inhalation capsule Place 18 mcg into inhaler and inhale daily.    03/15/2018 at 0800  . vitamin B-12 (CYANOCOBALAMIN) 1000 MCG tablet Take 1,000 mcg by mouth daily.   03/14/2018 at 2000  . zolpidem (AMBIEN) 10 MG tablet Take 10 mg by mouth at  bedtime as needed for sleep.    PRN at PRN   Scheduled: . docusate sodium  100 mg Oral BID  . enoxaparin (LOVENOX) injection  40 mg Subcutaneous Q24H  . ezetimibe  10 mg Oral Once per day on Mon Wed Fri  . fluticasone  2 spray Each Nare Daily  . hydrocortisone  1 application Topical BID  . metoprolol succinate  25 mg Oral Daily  . multivitamin with minerals  1 tablet Oral Daily  . pantoprazole  40 mg Oral Daily  . polycarbophil  625 mg Oral QPM  . sodium chloride flush  3 mL Intravenous Q12H  . tiotropium  18 mcg Inhalation Daily  . vitamin B-12  1,000 mcg Oral Daily    ROS: History obtained from the patient  General ROS: negative for - chills, fatigue, fever, night sweats, weight gain or weight loss Psychological ROS: negative for - behavioral disorder,  hallucinations, memory difficulties, mood swings or suicidal ideation Ophthalmic ROS: negative for - blurry vision, double vision, eye pain or loss of vision ENT ROS: negative for - epistaxis, nasal discharge, oral lesions, sore throat, tinnitus  Allergy and Immunology ROS: negative for - hives or itchy/watery eyes Hematological and Lymphatic ROS: negative for - bleeding problems, bruising or swollen lymph nodes Endocrine ROS: negative for - galactorrhea, hair pattern changes, polydipsia/polyuria or temperature intolerance Respiratory ROS: shortness of breath at times Cardiovascular ROS: negative for - chest pain, dyspnea on exertion, edema or irregular heartbeat Gastrointestinal ROS: negative for - abdominal pain, diarrhea, hematemesis, nausea/vomiting or stool incontinence Genito-Urinary ROS: negative for - dysuria, hematuria, incontinence or urinary frequency/urgency Musculoskeletal ROS: right ankle swelling Neurological ROS: as noted in HPI Dermatological ROS: negative for rash and skin lesion changes  Physical Examination: Blood pressure 129/73, pulse 82, temperature 97.9 F (36.6 C), temperature source Oral, resp. rate (!) 24, height 5\' 5"  (1.651 m), weight 57.4 kg (126 lb 8 oz), SpO2 93 %.  HEENT-  Normocephalic, no lesions, without obvious abnormality.  Normal external eye and conjunctiva.  Normal TM's bilaterally.  Normal auditory canals and external ears. Normal external nose, mucus membranes and septum.  Normal pharynx. Cardiovascular- S1, S2 normal, pulses palpable throughout   Lungs- chest clear, no wheezing, rales, normal symmetric air entry Abdomen- soft, non-tender; bowel sounds normal; no masses,  no organomegaly Extremities- no edema Lymph-no adenopathy palpable Musculoskeletal-no joint tenderness, deformity or swelling Skin-warm and dry, no hyperpigmentation, vitiligo, or suspicious lesions  Neurological Examination   Mental Status: Alert, oriented, thought content  appropriate.  Speech fluent without evidence of aphasia.  Able to follow 3 step commands without difficulty. Cranial Nerves: II: Discs flat bilaterally; Visual fields grossly normal, pupils equal, round, reactive to light and accommodation III,IV, VI: ptosis not present, extra-ocular motions intact bilaterally V,VII: smile symmetric, facial light touch sensation normal bilaterally VIII: hearing normal bilaterally IX,X: gag reflex present XI: bilateral shoulder shrug XII: midline tongue extension Motor: Right : Upper extremity   5/5    Left:     Upper extremity   5/5  Lower extremity   5/5     Lower extremity   5/5 Tone and bulk:normal tone throughout; no atrophy noted Sensory: Pinprick and light touch intact throughout, bilaterally Deep Tendon Reflexes: 2+ and symmetric with absent AJ's bilaterally Plantars: Right: downgoing   Left: downgoing Cerebellar: Normal finger-to-nose, normal rapid alternating movements and normal heel-to-shin testing bilaterally Gait: not tested due to safety concerns    Laboratory Studies:   Basic Metabolic Panel: Recent  Labs  Lab 03/15/18 1730  NA 139  K 3.9  CL 107  CO2 26  GLUCOSE 98  BUN 10  CREATININE 0.84  CALCIUM 9.0    Liver Function Tests: Recent Labs  Lab 03/15/18 1730  AST 25  ALT 18  ALKPHOS 73  BILITOT 0.9  PROT 6.8  ALBUMIN 4.0   No results for input(s): LIPASE, AMYLASE in the last 168 hours. No results for input(s): AMMONIA in the last 168 hours.  CBC: Recent Labs  Lab 03/15/18 1730  WBC 9.0  HGB 16.1  HCT 47.8  MCV 93.5  PLT 196    Cardiac Enzymes: Recent Labs  Lab 03/15/18 1730 03/15/18 2215 03/16/18 0410 03/16/18 0933  TROPONINI <0.03 <0.03 <0.03 <0.03    BNP: Invalid input(s): POCBNP  CBG: No results for input(s): GLUCAP in the last 168 hours.  Microbiology: Results for orders placed or performed in visit on 11/10/16  Microscopic Examination     Status: None   Collection Time: 11/10/16  9:01  AM  Result Value Ref Range Status   WBC, UA 0-5 0 - 5 /hpf Final   RBC, UA None seen 0 - 2 /hpf Final   Epithelial Cells (non renal) 0-10 0 - 10 /hpf Final   Bacteria, UA None seen None seen/Few Final    Coagulation Studies: No results for input(s): LABPROT, INR in the last 72 hours.  Urinalysis:  Recent Labs  Lab 03/15/18 1730  COLORURINE YELLOW*  LABSPEC 1.005  PHURINE 7.0  GLUCOSEU NEGATIVE  HGBUR NEGATIVE  BILIRUBINUR NEGATIVE  KETONESUR NEGATIVE  PROTEINUR NEGATIVE  NITRITE NEGATIVE  LEUKOCYTESUR NEGATIVE    Lipid Panel:  No results found for: CHOL, TRIG, HDL, CHOLHDL, VLDL, LDLCALC  HgbA1C: No results found for: HGBA1C  Urine Drug Screen:  No results found for: LABOPIA, COCAINSCRNUR, LABBENZ, AMPHETMU, THCU, LABBARB  Alcohol Level: No results for input(s): ETH in the last 168 hours.  Other results: EKG: 60 bpm.  Imaging: Ct Angio Head W Or Wo Contrast  Result Date: 03/16/2018 CLINICAL DATA:  Two near syncopal episodes. Right ICA stenosis and left ICA occlusion on ultrasound. EXAM: CT ANGIOGRAPHY HEAD AND NECK TECHNIQUE: Multidetector CT imaging of the head and neck was performed using the standard protocol during bolus administration of intravenous contrast. Multiplanar CT image reconstructions and MIPs were obtained to evaluate the vascular anatomy. Carotid stenosis measurements (when applicable) are obtained utilizing NASCET criteria, using the distal internal carotid diameter as the denominator. CONTRAST:  73mL OMNIPAQUE IOHEXOL 350 MG/ML SOLN COMPARISON:  Carotid Doppler ultrasound 03/16/2018 FINDINGS: CT HEAD FINDINGS Brain: There is no evidence of acute infarct, intracranial hemorrhage, mass, midline shift, or extra-axial fluid collection. Mild cerebral atrophy is within normal limits for age. Periventricular white matter hypodensities are nonspecific but compatible with minimal chronic small vessel ischemic disease, also considered to be within normal limits for  age. Vascular: Calcified atherosclerosis at the skull base. Skull: No fracture or focal osseous lesion. Sinuses: Visualized paranasal sinuses and mastoid air cells are clear. Orbits: Bilateral cataract extraction. Review of the MIP images confirms the above findings CTA NECK FINDINGS Aortic arch: Standard 3 vessel aortic arch with diffuse atherosclerotic plaque. Calcified and soft plaque at the arch vessel origins without significant stenosis. Right carotid system: Patent with prominent calcified plaque at the carotid bifurcation resulting in 60% proximal ICA stenosis. Left carotid system: Patent common carotid artery with soft plaque in its midportion resulting in less than 50% stenosis. Extensive calcified and soft plaque at  the carotid bifurcation with occlusion of the ICA at its origin, severe stenosis of the ECA origin, and 60% stenosis of the common carotid artery just proximal to the bifurcation. Vertebral arteries: Patent and codominant without stenosis. Skeleton: Moderate to severe diffuse cervical disc degeneration. Other neck: No mass or enlarged lymph nodes. Upper chest: Severe centrilobular emphysema. Review of the MIP images confirms the above findings CTA HEAD FINDINGS Anterior circulation: The right ICA is patent from skull base to terminus with mild nonstenotic atherosclerosis. There is reconstitution of the left supraclinoid ICA via the posterior communicating artery. ACAs and MCAs are patent without evidence of proximal branch occlusion or significant proximal stenosis. No aneurysm is identified. Posterior circulation: The intracranial vertebral arteries are widely patent to the basilar. Patent PICA, AICA, and SCA origins are visualized bilaterally. The basilar artery is widely patent. There are left larger than right posterior communicating arteries. The PCAs are patent without evidence of significant proximal stenosis. No aneurysm is identified. Venous sinuses: Patent. Anatomic variants: None.  Delayed phase: No abnormal enhancement. Review of the MIP images confirms the above findings IMPRESSION: 1. No evidence of acute intracranial abnormality. 2. 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 3. 60% proximal right ICA stenosis. 4. Mild intracranial atherosclerosis without significant stenosis. 5. Aortic Atherosclerosis (ICD10-I70.0) and Emphysema (ICD10-J43.9). Electronically Signed   By: Logan Bores M.D.   On: 03/16/2018 13:09   Ct Angio Neck W Or Wo Contrast  Result Date: 03/16/2018 CLINICAL DATA:  Two near syncopal episodes. Right ICA stenosis and left ICA occlusion on ultrasound. EXAM: CT ANGIOGRAPHY HEAD AND NECK TECHNIQUE: Multidetector CT imaging of the head and neck was performed using the standard protocol during bolus administration of intravenous contrast. Multiplanar CT image reconstructions and MIPs were obtained to evaluate the vascular anatomy. Carotid stenosis measurements (when applicable) are obtained utilizing NASCET criteria, using the distal internal carotid diameter as the denominator. CONTRAST:  54mL OMNIPAQUE IOHEXOL 350 MG/ML SOLN COMPARISON:  Carotid Doppler ultrasound 03/16/2018 FINDINGS: CT HEAD FINDINGS Brain: There is no evidence of acute infarct, intracranial hemorrhage, mass, midline shift, or extra-axial fluid collection. Mild cerebral atrophy is within normal limits for age. Periventricular white matter hypodensities are nonspecific but compatible with minimal chronic small vessel ischemic disease, also considered to be within normal limits for age. Vascular: Calcified atherosclerosis at the skull base. Skull: No fracture or focal osseous lesion. Sinuses: Visualized paranasal sinuses and mastoid air cells are clear. Orbits: Bilateral cataract extraction. Review of the MIP images confirms the above findings CTA NECK FINDINGS Aortic arch: Standard 3 vessel aortic arch with diffuse atherosclerotic plaque. Calcified and soft plaque at the arch  vessel origins without significant stenosis. Right carotid system: Patent with prominent calcified plaque at the carotid bifurcation resulting in 60% proximal ICA stenosis. Left carotid system: Patent common carotid artery with soft plaque in its midportion resulting in less than 50% stenosis. Extensive calcified and soft plaque at the carotid bifurcation with occlusion of the ICA at its origin, severe stenosis of the ECA origin, and 60% stenosis of the common carotid artery just proximal to the bifurcation. Vertebral arteries: Patent and codominant without stenosis. Skeleton: Moderate to severe diffuse cervical disc degeneration. Other neck: No mass or enlarged lymph nodes. Upper chest: Severe centrilobular emphysema. Review of the MIP images confirms the above findings CTA HEAD FINDINGS Anterior circulation: The right ICA is patent from skull base to terminus with mild nonstenotic atherosclerosis. There is reconstitution of the left supraclinoid ICA via the  posterior communicating artery. ACAs and MCAs are patent without evidence of proximal branch occlusion or significant proximal stenosis. No aneurysm is identified. Posterior circulation: The intracranial vertebral arteries are widely patent to the basilar. Patent PICA, AICA, and SCA origins are visualized bilaterally. The basilar artery is widely patent. There are left larger than right posterior communicating arteries. The PCAs are patent without evidence of significant proximal stenosis. No aneurysm is identified. Venous sinuses: Patent. Anatomic variants: None. Delayed phase: No abnormal enhancement. Review of the MIP images confirms the above findings IMPRESSION: 1. No evidence of acute intracranial abnormality. 2. 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 3. 60% proximal right ICA stenosis. 4. Mild intracranial atherosclerosis without significant stenosis. 5. Aortic Atherosclerosis (ICD10-I70.0) and Emphysema (ICD10-J43.9).  Electronically Signed   By: Logan Bores M.D.   On: 03/16/2018 13:09   US Carotid Bilateral  Result Date: 03/16/2018 CLINICAL DATA:  83 year old male with history syncope. Cardiovascular risk factors include hypertension, hyperlipidemia EXAM: BILATERAL CAROTID DUPLEX ULTRASOUND TECHNIQUE: Pearline Cables scale imaging, color Doppler and duplex ultrasound were performed of bilateral carotid and vertebral arteries in the neck. COMPARISON:  None. FINDINGS: Criteria: Quantification of carotid stenosis is based on velocity parameters that correlate the residual internal carotid diameter with NASCET-based stenosis levels, using the diameter of the distal internal carotid lumen as the denominator for stenosis measurement. The following velocity measurements were obtained: RIGHT ICA:  Systolic 476 cm/sec, Diastolic 66 cm/sec CCA:  93 cm/sec SYSTOLIC ICA/CCA RATIO:  2.5 ECA:  187 cm/sec LEFT ICA: Left ICA occlusion CCA:  42 cm/sec SYSTOLIC ICA/CCA RATIO:  Not calculable ECA:  114 cm/sec Right Brachial SBP: Not acquired Left Brachial SBP: Not acquired RIGHT CAROTID ARTERY: No significant calcifications of the right common carotid artery. Intermediate waveform maintained. Moderate heterogeneous and partially calcified plaque at the right carotid bifurcation. No significant lumen shadowing. Low resistance waveform of the right ICA. No significant tortuosity. RIGHT VERTEBRAL ARTERY: Antegrade flow with low resistance waveform. LEFT CAROTID ARTERY: No significant flow in the left internal carotid artery, with trickle flow within the lumen. Atherosclerotic calcifications at the bifurcation. LEFT VERTEBRAL ARTERY:  Antegrade flow with low resistance waveform. IMPRESSION: Right: Heterogeneous and partially calcified plaque contributes to 50%-69% stenosis by established duplex criteria. Left: Left ICA occlusion, indeterminate chronicity. Signed, Dulcy Fanny. Dellia Nims, RPVI [facility]  The case and results were discussed telephone at the time of interpretation on 03/16/2018 at 11:01 am with Dr. Ulice Bold MODY. Electronically Signed   By: Corrie Mckusick D.O.   On: 03/16/2018 11:01   Dg Chest Port 1 View  Result Date: 03/15/2018 CLINICAL DATA:  Weakness, fall yesterday. Intermittent dizziness. History of coronary artery disease, hypertension, heart failure. EXAM: PORTABLE CHEST 1 VIEW COMPARISON:  None. FINDINGS: Heart size and mediastinal contours are within normal limits. Atherosclerotic changes noted at the aortic arch. Lungs are clear. No pleural effusion or pneumothorax seen. No acute or suspicious osseous finding. IMPRESSION: 1. No active disease. No evidence of pneumonia or pulmonary edema. No osseous fracture or dislocation seen. 2. Aortic atherosclerosis. Electronically Signed   By: Franki Cabot M.D.   On: 03/15/2018 19:18     Assessment/Plan: 83 year old male presenting after a fall/syncopal event and dizzy episode.  Patient orthostatic.  Also noted on carotid dopplers to have left carotid occlusion.  CTA of head and neck performed and reviewed, confirming left carotid occlusion.  RICA at 60% stenosis.  No significant posterior circulation stenosis.  Left carotid occlusion not likely the cause of his presenting event.  Patient is orthostatic though and this may have contributed.  With complete occlusion noted no surgical intervention indicated but patient will benefit from antiplatelet therapy.  Since presenting event may have represented a TIA would look into further possible vascular risk factors and patient will likely benefit from further cardiac monitoring.    Recommendations: 1. Agree with addressing orthostatis   2. A1c, lipid panel. Target A1c<7.0, target LDL<70.   3. Patient reports side effects to ASA.  Would start Plavix 75mg  daily 4. If echocardiogram is unremarkable would have patient undergo prolonged cardiac monitoring as an outpatient.  Patient to follow up with  his outpatient cardiologist.     Alexis Goodell, MD Neurology [phone] 03/16/2018, 2:52 PM

## 2018-03-16 NOTE — Plan of Care (Signed)
Up to bathroom with 1 assist.  Gait steady.  No complaints of dizziness.

## 2018-03-16 NOTE — Progress Notes (Signed)
[facility] NAME: Eric Li    MR#:  [id number]  DATE OF BIRTH:  [birth year]  SUBJECTIVE:  Doing well  REVIEW OF SYSTEMS:    Review of Systems  Constitutional: Negative for fever, chills weight loss HENT: Negative for ear pain, nosebleeds, congestion, facial swelling, rhinorrhea, neck pain, neck stiffness and ear discharge.   Respiratory: Negative for cough, shortness of breath, wheezing  Cardiovascular: Negative for chest pain, palpitations and leg swelling.  Gastrointestinal: Negative for heartburn, abdominal pain, vomiting, diarrhea or consitpation Genitourinary: Negative for dysuria, urgency, frequency, hematuria Musculoskeletal: Negative for back pain or joint pain Neurological: Negative for dizziness, seizures, syncope, focal weakness,  numbness and headaches.  Hematological: Does not bruise/bleed easily.  Psychiatric/Behavioral: Negative for hallucinations, confusion, dysphoric mood    Tolerating Diet:yes      DRUG ALLERGIES:  No Known Allergies  VITALS:  Blood pressure 129/73, pulse 82, temperature 97.9 F (36.6 C), temperature source Oral, resp. rate (!) 24, height 5\' 5"  (1.651 m), weight 57.4 kg (126 lb 8 oz), SpO2 93 %.  PHYSICAL EXAMINATION:  Constitutional: Appears well-developed and well-nourished. No distress. HENT: Normocephalic. Marland Kitchen Oropharynx is clear and moist.  Eyes: Conjunctivae and EOM are normal. PERRLA, no scleral icterus.  Neck: Normal ROM. Neck supple. No JVD. No tracheal deviation. CVS: RRR, S1/S2 +, no murmurs, no gallops, no carotid bruit.  Pulmonary: Effort and breath sounds normal, no stridor, rhonchi, wheezes, rales.  Abdominal: Soft. BS +,  no distension, tenderness, rebound or guarding.  Musculoskeletal: Normal range of motion. No edema and no tenderness.  Neuro: Alert. CN 2-12 grossly intact. No focal deficits. Skin: Skin is warm and dry. No rash noted. Psychiatric: Normal mood and  affect.      LABORATORY PANEL:   CBC Recent Labs  Lab 03/15/18 1730  WBC 9.0  HGB 16.1  HCT 47.8  PLT 196   ------------------------------------------------------------------------------------------------------------------  Chemistries  Recent Labs  Lab 03/15/18 1730  NA 139  K 3.9  CL 107  CO2 26  GLUCOSE 98  BUN 10  CREATININE 0.84  CALCIUM 9.0  AST 25  ALT 18  ALKPHOS 73  BILITOT 0.9   ------------------------------------------------------------------------------------------------------------------  Cardiac Enzymes Recent Labs  Lab 03/15/18 2215 03/16/18 0410 03/16/18 0933  TROPONINI <0.03 <0.03 <0.03   ------------------------------------------------------------------------------------------------------------------  RADIOLOGY:  US Carotid Bilateral  Result Date: 03/16/2018 CLINICAL DATA:  83 year old male with history syncope. Cardiovascular risk factors include hypertension, hyperlipidemia EXAM: BILATERAL CAROTID DUPLEX ULTRASOUND TECHNIQUE: Pearline Cables scale imaging, color Doppler and duplex ultrasound were performed of bilateral carotid and vertebral arteries in the neck. COMPARISON:  None. FINDINGS: Criteria: Quantification of carotid stenosis is based on velocity parameters that correlate the residual internal carotid diameter with NASCET-based stenosis levels, using the diameter of the distal internal carotid lumen as the denominator for stenosis measurement. The following velocity measurements were obtained: RIGHT ICA:  Systolic 347 cm/sec, Diastolic 66 cm/sec CCA:  93 cm/sec SYSTOLIC ICA/CCA RATIO:  2.5 ECA:  187 cm/sec LEFT ICA: Left ICA occlusion CCA:  42 cm/sec SYSTOLIC ICA/CCA RATIO:  Not calculable ECA:  114 cm/sec Right Brachial SBP: Not acquired Left Brachial SBP: Not acquired RIGHT CAROTID ARTERY: No significant calcifications of the right common carotid artery. Intermediate waveform maintained. Moderate heterogeneous and partially calcified plaque at  the right carotid bifurcation. No significant lumen shadowing. Low resistance waveform of the right ICA. No significant tortuosity. RIGHT VERTEBRAL ARTERY: Antegrade flow with low resistance waveform. LEFT CAROTID ARTERY:  No significant flow in the left internal carotid artery, with trickle flow within the lumen. Atherosclerotic calcifications at the bifurcation. LEFT VERTEBRAL ARTERY:  Antegrade flow with low resistance waveform. IMPRESSION: Right: Heterogeneous and partially calcified plaque contributes to 50%-69% stenosis by established duplex criteria. Left: Left ICA occlusion, indeterminate chronicity. Signed, Dulcy Fanny. Dellia Nims, RPVI [facility] The case and results were discussed telephone at the time of interpretation on 03/16/2018 at 11:01 am with Dr. Ulice Bold Larkyn Greenberger. Electronically Signed   By: Corrie Mckusick D.O.   On: 03/16/2018 11:01   Dg Chest Port 1 View  Result Date: 03/15/2018 CLINICAL DATA:  Weakness, fall yesterday. Intermittent dizziness. History of coronary artery disease, hypertension, heart failure. EXAM: PORTABLE CHEST 1 VIEW COMPARISON:  None. FINDINGS: Heart size and mediastinal contours are within normal limits. Atherosclerotic changes noted at the aortic arch. Lungs are clear. No pleural effusion or pneumothorax seen. No acute or suspicious osseous finding. IMPRESSION: 1. No active disease. No evidence of pneumonia or pulmonary edema. No osseous fracture or dislocation seen. 2. Aortic atherosclerosis. Electronically Signed   By: Franki Cabot M.D.   On: 03/15/2018 19:18     ASSESSMENT AND PLAN:   83 year old male with history of essential hypertension, CAD and COPD who presented with syncope.  1.  Syncope: I am concerned about carotid artery disease. .  Patient experienced syncopal episodes while turning his head.  Follow up echocardiogram and carotid Doppler. Telemetry showed no abnormal rhythm.  Troponins were negative.     2.  COPD without signs of exacerbation Continue inhalers 3.  Essential hypertension: Patient will continue metoprolol  4.  Hyperlipidemia: Continue Zetia.  Patient is intolerant to statins.        Management plans discussed with the patient and he is in agreement.  CODE STATUS: full  TOTAL TIME TAKING CARE OF THIS PATIENT: 30 minutes.     POSSIBLE D/C 1-2 days, DEPENDING ON CLINICAL CONDITION.   Ledger Heindl M.D on 03/16/2018 at 11:04 AM  Between 7am to 6pm - Pager - [phone] After 6pm go to [email or web address] - password [email or web address] [facility]  Office  [phone]  CC: Primary care physician; [name], MD  Note: This dictation was prepared with Dragon dictation along with smaller phrase technology. Any transcriptional errors that result from this process are unintentional.

## 2018-03-16 NOTE — Progress Notes (Signed)
*  PRELIMINARY RESULTS* Echocardiogram 2D Echocardiogram has been performed.  Eric Li 03/16/2018, 11:36 AM

## 2018-03-17 DIAGNOSIS — R55 Syncope and collapse: Secondary | ICD-10-CM | POA: Diagnosis not present

## 2018-03-17 LAB — HEMOGLOBIN A1C
HEMOGLOBIN A1C: 5.8 % — AB (ref 4.8–5.6)
Mean Plasma Glucose: 119.76 mg/dL

## 2018-03-17 LAB — ECHOCARDIOGRAM COMPLETE
HEIGHTINCHES: 65 in
Weight: 2024 oz

## 2018-03-17 LAB — GLUCOSE, CAPILLARY: Glucose-Capillary: 119 mg/dL — ABNORMAL HIGH (ref 70–99)

## 2018-03-17 MED ORDER — CLOPIDOGREL BISULFATE 75 MG PO TABS: 75.0000 mg | ORAL_TABLET | Freq: Every day | ORAL | 0 refills | Status: DC

## 2018-03-17 NOTE — Progress Notes (Signed)
Discharge instructions explained to pt and pts wife/ verbalized an understanding/ iv and tele removed/ RX given to pt/ transported via wheelchair off unit to Dr. Sharlet Salina office to have event monitor placed.

## 2018-03-17 NOTE — Discharge Summary (Signed)
[facility] NAME: Eric Li    MR#:  [id number]  DATE OF BIRTH:  04-Sep-1935  DATE OF ADMISSION:  03/15/2018 ADMITTING PHYSICIAN: Nicholes Mango, MD  DATE OF DISCHARGE: 03/17/2018  PRIMARY CARE PHYSICIAN: [name], MD    ADMISSION DIAGNOSIS:  Near syncope [R55]  DISCHARGE DIAGNOSIS:  Active Problems:   Syncope   SECONDARY DIAGNOSIS:   Past Medical History:  Diagnosis Date  . Bilateral hearing loss 03/03/2016  . CAD (coronary artery disease) 02/24/2014  . COPD, moderate ([place]) 08/29/2015  . Hemorrhoids 02/24/2014  . HTN (hypertension) 02/24/2014  . Hyperlipidemia, unspecified 02/24/2014  . Nephrolithiasis   . SOB (shortness of breath) on exertion 03/27/2015  . Statin intolerance 03/30/2015    HOSPITAL COURSE:   83 year old male with history of essential hypertension, CAD and COPD who presented with syncope.  1.  Syncope: This is likely vasovagal.  Patient experienced syncopal episodes while turning his head.  He underwent echocardiogram and carotid Doppler. 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  CTA of the head and neck confirmed left carotid occlusion.  Right ICA with 60% stenosis.  Patient has no significant posterior circulation stenosis.  As per neurology left carotid occlusion was not the cause of his presenting event.  It is likely that syncopal event was due to orthostasis.  Patient was evaluated by neurology and vascular surgery.  He will have follow-up with vascular surgery in 2 to 3 weeks. It has been recommended that patient start antiplatelet therapy with Plavix. Echocardiogram showed no major abnormalities. Telemetry showed no abnormal rhythm.  Troponins were negative.  Patient will have follow-up with cardiology and will need a 2-week event recorder.  2.  COPD without signs of exacerbation Continue inhalers 3.  Essential hypertension: Patient will continue metoprolol  4.  Hyperlipidemia:  Continue Zetia.  Patient is intolerant to statins.  DISCHARGE CONDITIONS AND DIET:   Stable for discharge on heart healthy diet  CONSULTS OBTAINED:  Treatment Team:  Catarina Hartshorn, MD Alexis Goodell, MD Schnier, Dolores Lory, MD  DRUG ALLERGIES:  No Known Allergies  DISCHARGE MEDICATIONS:   Allergies as of 03/17/2018   No Known Allergies     Medication List    TAKE these medications   albuterol 108 (90 Base) MCG/ACT inhaler Commonly known as:  PROVENTIL HFA;VENTOLIN HFA Inhale 2 puffs into the lungs every 6 (six) hours as needed for wheezing or shortness of breath.   clopidogrel 75 MG tablet Commonly known as:  PLAVIX Take 1 tablet (75 mg total) by mouth daily.   ezetimibe 10 MG tablet Commonly known as:  ZETIA Take 10 mg by mouth 3 (three) times a week.   fluticasone 50 MCG/ACT nasal spray Commonly known as:  FLONASE Place 2 sprays into both nostrils daily.   hydrocortisone 2.5 % cream Apply 1 application topically 2 (two) times daily.   ipratropium 0.03 % nasal spray Commonly known as:  ATROVENT Place 2 sprays into both nostrils 3 (three) times daily as needed for rhinitis.   metoprolol succinate 25 MG 24 hr tablet Commonly known as:  TOPROL-XL Take 25 mg by mouth daily.   mometasone 0.1 % lotion Commonly known as:  ELOCON Instill 4 drops in each ear at bedtime as needed for dryness or itch   multivitamin with minerals Tabs tablet Take 1 tablet by mouth daily.   omeprazole 20 MG capsule Commonly known as:  PRILOSEC Take 20 mg by mouth daily.  polycarbophil 625 MG tablet Commonly known as:  FIBERCON Take 625 mg by mouth daily.   SPIRIVA HANDIHALER 18 MCG inhalation capsule Generic drug:  tiotropium Place 18 mcg into inhaler and inhale daily.   vitamin B-12 1000 MCG tablet Commonly known as:  CYANOCOBALAMIN Take 1,000 mcg by mouth daily.   zolpidem 10 MG tablet Commonly known as:  AMBIEN Take 10 mg by mouth at bedtime as needed for  sleep.         Today   CHIEF COMPLAINT:  Patient doing well this morning.  Ambulated with physical therapy without any issues.   VITAL SIGNS:  Blood pressure 106/60, pulse 70, temperature 97.6 F (36.4 C), temperature source Oral, resp. rate 20, height 5\' 5"  (1.651 m), weight 57.4 kg (126 lb 8 oz), SpO2 92 %.   REVIEW OF SYSTEMS:  Review of Systems  Constitutional: Negative.  Negative for chills, fever and malaise/fatigue.  HENT: Negative.  Negative for ear discharge, ear pain, hearing loss, nosebleeds and sore throat.   Eyes: Negative.  Negative for blurred vision and pain.  Respiratory: Negative.  Negative for cough, hemoptysis, shortness of breath and wheezing.   Cardiovascular: Negative.  Negative for chest pain, palpitations and leg swelling.  Gastrointestinal: Negative.  Negative for abdominal pain, blood in stool, diarrhea, nausea and vomiting.  Genitourinary: Negative.  Negative for dysuria.  Musculoskeletal: Negative.  Negative for back pain.  Skin: Negative.   Neurological: Negative for dizziness, tremors, speech change, focal weakness, seizures and headaches.  Endo/Heme/Allergies: Negative.  Does not bruise/bleed easily.  Psychiatric/Behavioral: Negative.  Negative for depression, hallucinations and suicidal ideas.     PHYSICAL EXAMINATION:  GENERAL:  83 y.o.-year-old patient lying in the bed with no acute distress.  NECK:  Supple, no jugular venous distention. No thyroid enlargement, no tenderness.  LUNGS: Normal breath sounds bilaterally, no wheezing, rales,rhonchi  No use of accessory muscles of respiration.  CARDIOVASCULAR: S1, S2 normal. No murmurs, rubs, or gallops.  ABDOMEN: Soft, non-tender, non-distended. Bowel sounds present. No organomegaly or mass.  EXTREMITIES: No pedal edema, cyanosis, or clubbing.  PSYCHIATRIC: The patient is alert and oriented x 3.  SKIN: No obvious rash, lesion, or ulcer.   DATA REVIEW:   CBC Recent Labs  Lab  03/15/18 1730  WBC 9.0  HGB 16.1  HCT 47.8  PLT 196    Chemistries  Recent Labs  Lab 03/15/18 1730  NA 139  K 3.9  CL 107  CO2 26  GLUCOSE 98  BUN 10  CREATININE 0.84  CALCIUM 9.0  AST 25  ALT 18  ALKPHOS 73  BILITOT 0.9    Cardiac Enzymes Recent Labs  Lab 03/15/18 2215 03/16/18 0410 03/16/18 0933  TROPONINI <0.03 <0.03 <0.03    Microbiology Results  @[email or web address] @  RADIOLOGY:  Ct Angio Head W Or Wo Contrast  Result Date: 03/16/2018 CLINICAL DATA:  Two near syncopal episodes. Right ICA stenosis and left ICA occlusion on ultrasound. EXAM: CT ANGIOGRAPHY HEAD AND NECK TECHNIQUE: Multidetector CT imaging of the head and neck was performed using the standard protocol during bolus administration of intravenous contrast. Multiplanar CT image reconstructions and MIPs were obtained to evaluate the vascular anatomy. Carotid stenosis measurements (when applicable) are obtained utilizing NASCET criteria, using the distal internal carotid diameter as the denominator. CONTRAST:  76mL OMNIPAQUE IOHEXOL 350 MG/ML SOLN COMPARISON:  Carotid Doppler ultrasound 03/16/2018 FINDINGS: CT HEAD FINDINGS Brain: There is no evidence of acute infarct, intracranial hemorrhage, mass, midline shift, or extra-axial fluid collection. Mild  cerebral atrophy is within normal limits for age. Periventricular white matter hypodensities are nonspecific but compatible with minimal chronic small vessel ischemic disease, also considered to be within normal limits for age. Vascular: Calcified atherosclerosis at the skull base. Skull: No fracture or focal osseous lesion. Sinuses: Visualized paranasal sinuses and mastoid air cells are clear. Orbits: Bilateral cataract extraction. Review of the MIP images confirms the above findings CTA NECK FINDINGS Aortic arch: Standard 3 vessel aortic arch with diffuse atherosclerotic plaque. Calcified and soft plaque at the arch vessel origins without significant stenosis. Right  carotid system: Patent with prominent calcified plaque at the carotid bifurcation resulting in 60% proximal ICA stenosis. Left carotid system: Patent common carotid artery with soft plaque in its midportion resulting in less than 50% stenosis. Extensive calcified and soft plaque at the carotid bifurcation with occlusion of the ICA at its origin, severe stenosis of the ECA origin, and 60% stenosis of the common carotid artery just proximal to the bifurcation. Vertebral arteries: Patent and codominant without stenosis. Skeleton: Moderate to severe diffuse cervical disc degeneration. Other neck: No mass or enlarged lymph nodes. Upper chest: Severe centrilobular emphysema. Review of the MIP images confirms the above findings CTA HEAD FINDINGS Anterior circulation: The right ICA is patent from skull base to terminus with mild nonstenotic atherosclerosis. There is reconstitution of the left supraclinoid ICA via the posterior communicating artery. ACAs and MCAs are patent without evidence of proximal branch occlusion or significant proximal stenosis. No aneurysm is identified. Posterior circulation: The intracranial vertebral arteries are widely patent to the basilar. Patent PICA, AICA, and SCA origins are visualized bilaterally. The basilar artery is widely patent. There are left larger than right posterior communicating arteries. The PCAs are patent without evidence of significant proximal stenosis. No aneurysm is identified. Venous sinuses: Patent. Anatomic variants: None. Delayed phase: No abnormal enhancement. Review of the MIP images confirms the above findings IMPRESSION: 1. No evidence of acute intracranial abnormality. 2. 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 3. 60% proximal right ICA stenosis. 4. Mild intracranial atherosclerosis without significant stenosis. 5. Aortic Atherosclerosis (ICD10-I70.0) and Emphysema (ICD10-J43.9). Electronically Signed   By: Logan Bores M.D.   On:  03/16/2018 13:09   Ct Angio Neck W Or Wo Contrast  Result Date: 03/16/2018 CLINICAL DATA:  Two near syncopal episodes. Right ICA stenosis and left ICA occlusion on ultrasound. EXAM: CT ANGIOGRAPHY HEAD AND NECK TECHNIQUE: Multidetector CT imaging of the head and neck was performed using the standard protocol during bolus administration of intravenous contrast. Multiplanar CT image reconstructions and MIPs were obtained to evaluate the vascular anatomy. Carotid stenosis measurements (when applicable) are obtained utilizing NASCET criteria, using the distal internal carotid diameter as the denominator. CONTRAST:  70mL OMNIPAQUE IOHEXOL 350 MG/ML SOLN COMPARISON:  Carotid Doppler ultrasound 03/16/2018 FINDINGS: CT HEAD FINDINGS Brain: There is no evidence of acute infarct, intracranial hemorrhage, mass, midline shift, or extra-axial fluid collection. Mild cerebral atrophy is within normal limits for age. Periventricular white matter hypodensities are nonspecific but compatible with minimal chronic small vessel ischemic disease, also considered to be within normal limits for age. Vascular: Calcified atherosclerosis at the skull base. Skull: No fracture or focal osseous lesion. Sinuses: Visualized paranasal sinuses and mastoid air cells are clear. Orbits: Bilateral cataract extraction. Review of the MIP images confirms the above findings CTA NECK FINDINGS Aortic arch: Standard 3 vessel aortic arch with diffuse atherosclerotic plaque. Calcified and soft plaque at the arch vessel origins without significant stenosis. Right  carotid system: Patent with prominent calcified plaque at the carotid bifurcation resulting in 60% proximal ICA stenosis. Left carotid system: Patent common carotid artery with soft plaque in its midportion resulting in less than 50% stenosis. Extensive calcified and soft plaque at the carotid bifurcation with occlusion of the ICA at its origin, severe stenosis of the ECA origin, and 60% stenosis of  the common carotid artery just proximal to the bifurcation. Vertebral arteries: Patent and codominant without stenosis. Skeleton: Moderate to severe diffuse cervical disc degeneration. Other neck: No mass or enlarged lymph nodes. Upper chest: Severe centrilobular emphysema. Review of the MIP images confirms the above findings CTA HEAD FINDINGS Anterior circulation: The right ICA is patent from skull base to terminus with mild nonstenotic atherosclerosis. There is reconstitution of the left supraclinoid ICA via the posterior communicating artery. ACAs and MCAs are patent without evidence of proximal branch occlusion or significant proximal stenosis. No aneurysm is identified. Posterior circulation: The intracranial vertebral arteries are widely patent to the basilar. Patent PICA, AICA, and SCA origins are visualized bilaterally. The basilar artery is widely patent. There are left larger than right posterior communicating arteries. The PCAs are patent without evidence of significant proximal stenosis. No aneurysm is identified. Venous sinuses: Patent. Anatomic variants: None. Delayed phase: No abnormal enhancement. Review of the MIP images confirms the above findings IMPRESSION: 1. No evidence of acute intracranial abnormality. 2. 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 3. 60% proximal right ICA stenosis. 4. Mild intracranial atherosclerosis without significant stenosis. 5. Aortic Atherosclerosis (ICD10-I70.0) and Emphysema (ICD10-J43.9). Electronically Signed   By: Logan Bores M.D.   On: 03/16/2018 13:09   US Carotid Bilateral  Result Date: 03/16/2018 CLINICAL DATA:  83 year old male with history syncope. Cardiovascular risk factors include hypertension, hyperlipidemia EXAM: BILATERAL CAROTID DUPLEX ULTRASOUND TECHNIQUE: Pearline Cables scale imaging, color Doppler and duplex ultrasound were performed of bilateral carotid and vertebral arteries in the neck. COMPARISON:  None. FINDINGS: Criteria:  Quantification of carotid stenosis is based on velocity parameters that correlate the residual internal carotid diameter with NASCET-based stenosis levels, using the diameter of the distal internal carotid lumen as the denominator for stenosis measurement. The following velocity measurements were obtained: RIGHT ICA:  Systolic 834 cm/sec, Diastolic 66 cm/sec CCA:  93 cm/sec SYSTOLIC ICA/CCA RATIO:  2.5 ECA:  187 cm/sec LEFT ICA: Left ICA occlusion CCA:  42 cm/sec SYSTOLIC ICA/CCA RATIO:  Not calculable ECA:  114 cm/sec Right Brachial SBP: Not acquired Left Brachial SBP: Not acquired RIGHT CAROTID ARTERY: No significant calcifications of the right common carotid artery. Intermediate waveform maintained. Moderate heterogeneous and partially calcified plaque at the right carotid bifurcation. No significant lumen shadowing. Low resistance waveform of the right ICA. No significant tortuosity. RIGHT VERTEBRAL ARTERY: Antegrade flow with low resistance waveform. LEFT CAROTID ARTERY: No significant flow in the left internal carotid artery, with trickle flow within the lumen. Atherosclerotic calcifications at the bifurcation. LEFT VERTEBRAL ARTERY:  Antegrade flow with low resistance waveform. IMPRESSION: Right: Heterogeneous and partially calcified plaque contributes to 50%-69% stenosis by established duplex criteria. Left: Left ICA occlusion, indeterminate chronicity. Signed, Dulcy Fanny. Dellia Nims, RPVI [facility] The case and results were discussed telephone at the time of interpretation on 03/16/2018 at 11:01 am with Dr. Ulice Bold Pina Sirianni. Electronically Signed   By: Corrie Mckusick D.O.   On: 03/16/2018 11:01   Dg Chest Port 1 View  Result Date: 03/15/2018 CLINICAL DATA:  Weakness, fall yesterday. Intermittent dizziness. History  of coronary artery disease, hypertension, heart failure. EXAM: PORTABLE CHEST 1 VIEW COMPARISON:  None. FINDINGS: Heart size and mediastinal  contours are within normal limits. Atherosclerotic changes noted at the aortic arch. Lungs are clear. No pleural effusion or pneumothorax seen. No acute or suspicious osseous finding. IMPRESSION: 1. No active disease. No evidence of pneumonia or pulmonary edema. No osseous fracture or dislocation seen. 2. Aortic atherosclerosis. Electronically Signed   By: Franki Cabot M.D.   On: 03/15/2018 19:18      Allergies as of 03/17/2018   No Known Allergies     Medication List    TAKE these medications   albuterol 108 (90 Base) MCG/ACT inhaler Commonly known as:  PROVENTIL HFA;VENTOLIN HFA Inhale 2 puffs into the lungs every 6 (six) hours as needed for wheezing or shortness of breath.   clopidogrel 75 MG tablet Commonly known as:  PLAVIX Take 1 tablet (75 mg total) by mouth daily.   ezetimibe 10 MG tablet Commonly known as:  ZETIA Take 10 mg by mouth 3 (three) times a week.   fluticasone 50 MCG/ACT nasal spray Commonly known as:  FLONASE Place 2 sprays into both nostrils daily.   hydrocortisone 2.5 % cream Apply 1 application topically 2 (two) times daily.   ipratropium 0.03 % nasal spray Commonly known as:  ATROVENT Place 2 sprays into both nostrils 3 (three) times daily as needed for rhinitis.   metoprolol succinate 25 MG 24 hr tablet Commonly known as:  TOPROL-XL Take 25 mg by mouth daily.   mometasone 0.1 % lotion Commonly known as:  ELOCON Instill 4 drops in each ear at bedtime as needed for dryness or itch   multivitamin with minerals Tabs tablet Take 1 tablet by mouth daily.   omeprazole 20 MG capsule Commonly known as:  PRILOSEC Take 20 mg by mouth daily.   polycarbophil 625 MG tablet Commonly known as:  FIBERCON Take 625 mg by mouth daily.   SPIRIVA HANDIHALER 18 MCG inhalation capsule Generic drug:  tiotropium Place 18 mcg into inhaler and inhale daily.   vitamin B-12 1000 MCG tablet Commonly known as:  CYANOCOBALAMIN Take 1,000 mcg by mouth daily.    zolpidem 10 MG tablet Commonly known as:  AMBIEN Take 10 mg by mouth at bedtime as needed for sleep.          Management plans discussed with the patient and he is in agreement. Stable for discharge home  Patient should follow up with pcp  CODE STATUS:     Code Status Orders  (From admission, onward)        Start     Ordered   03/15/18 2050  Full code  Continuous     03/15/18 2049    Code Status History    This patient has a current code status but no historical code status.    Advance Directive Documentation     Most Recent Value  Type of Advance Directive  Living will  Pre-existing out of facility DNR order (yellow form or pink MOST form)  -  "MOST" Form in Place?  -      TOTAL TIME TAKING CARE OF THIS PATIENT: 38 minutes.    Note: This dictation was prepared with Dragon dictation along with smaller phrase technology. Any transcriptional errors that result from this process are unintentional.  Duyen Beckom M.D on 03/17/2018 at 8:45 AM  Between 7am to 6pm - Pager - [phone] After 6pm go to [email or web address] - [place]  [place]  [phone]  CC: Primary care physician; [name], MD

## 2018-03-18 DIAGNOSIS — I6522 Occlusion and stenosis of left carotid artery: Secondary | ICD-10-CM | POA: Diagnosis not present

## 2018-03-18 DIAGNOSIS — I2510 Atherosclerotic heart disease of native coronary artery without angina pectoris: Secondary | ICD-10-CM | POA: Diagnosis not present

## 2018-03-18 DIAGNOSIS — Z09 Encounter for follow-up examination after completed treatment for conditions other than malignant neoplasm: Secondary | ICD-10-CM | POA: Diagnosis not present

## 2018-03-18 DIAGNOSIS — R55 Syncope and collapse: Secondary | ICD-10-CM | POA: Diagnosis not present

## 2018-03-18 DIAGNOSIS — I10 Essential (primary) hypertension: Secondary | ICD-10-CM | POA: Diagnosis not present

## 2018-03-18 DIAGNOSIS — E785 Hyperlipidemia, unspecified: Secondary | ICD-10-CM | POA: Diagnosis not present

## 2018-03-24 DIAGNOSIS — I10 Essential (primary) hypertension: Secondary | ICD-10-CM | POA: Diagnosis not present

## 2018-03-24 DIAGNOSIS — I6522 Occlusion and stenosis of left carotid artery: Secondary | ICD-10-CM | POA: Diagnosis not present

## 2018-03-24 DIAGNOSIS — E785 Hyperlipidemia, unspecified: Secondary | ICD-10-CM | POA: Diagnosis not present

## 2018-03-24 DIAGNOSIS — R0602 Shortness of breath: Secondary | ICD-10-CM | POA: Diagnosis not present

## 2018-03-24 DIAGNOSIS — J449 Chronic obstructive pulmonary disease, unspecified: Secondary | ICD-10-CM | POA: Diagnosis not present

## 2018-03-24 DIAGNOSIS — I491 Atrial premature depolarization: Secondary | ICD-10-CM | POA: Diagnosis not present

## 2018-03-24 DIAGNOSIS — I2510 Atherosclerotic heart disease of native coronary artery without angina pectoris: Secondary | ICD-10-CM | POA: Diagnosis not present

## 2018-03-24 DIAGNOSIS — Z789 Other specified health status: Secondary | ICD-10-CM | POA: Diagnosis not present

## 2018-04-01 ENCOUNTER — Ambulatory Visit (INDEPENDENT_AMBULATORY_CARE_PROVIDER_SITE_OTHER): Payer: PPO | Admitting: Vascular Surgery

## 2018-04-01 ENCOUNTER — Encounter (INDEPENDENT_AMBULATORY_CARE_PROVIDER_SITE_OTHER): Payer: Self-pay | Admitting: Vascular Surgery

## 2018-04-01 VITALS — BP 109/68 | HR 83 | Resp 16 | Ht 65.0 in | Wt 128.6 lb

## 2018-04-01 DIAGNOSIS — I6523 Occlusion and stenosis of bilateral carotid arteries: Secondary | ICD-10-CM

## 2018-04-01 DIAGNOSIS — R55 Syncope and collapse: Secondary | ICD-10-CM

## 2018-04-01 DIAGNOSIS — E785 Hyperlipidemia, unspecified: Secondary | ICD-10-CM | POA: Diagnosis not present

## 2018-04-01 DIAGNOSIS — I25118 Atherosclerotic heart disease of native coronary artery with other forms of angina pectoris: Secondary | ICD-10-CM | POA: Diagnosis not present

## 2018-04-01 DIAGNOSIS — J449 Chronic obstructive pulmonary disease, unspecified: Secondary | ICD-10-CM | POA: Diagnosis not present

## 2018-04-01 DIAGNOSIS — I739 Peripheral vascular disease, unspecified: Secondary | ICD-10-CM | POA: Diagnosis not present

## 2018-04-01 MED ORDER — PANTOPRAZOLE SODIUM 40 MG PO TBEC: 40.0000 mg | DELAYED_RELEASE_TABLET | Freq: Every day | ORAL | 11 refills | Status: DC

## 2018-04-01 MED ORDER — CLOPIDOGREL BISULFATE 75 MG PO TABS: 75.0000 mg | ORAL_TABLET | Freq: Every day | ORAL | 11 refills | Status: DC

## 2018-04-01 NOTE — Progress Notes (Signed)
MRN : [id number]  Eric Li is a 83 y.o. (04-02-1935) male who presents with chief complaint of  Chief Complaint  Patient presents with  . Follow-up    [facility] 2wk  .  History of Present Illness: The patient is seen for follow up evaluation of carotid stenosis. The carotid stenosis was identified recently after admission to [facility] for syncope.  CTA showed 50-60% RICA and occlusion of the LICA.  He continue to have intermittent episodes of dizziness  The patient denies amaurosis fugax. There is no recent history of TIA symptoms or focal motor deficits. There is no prior documented CVA.  The patient is taking enteric-coated aspirin 81 mg daily plus plavix 75 mg po daily.  There is no history of migraine headaches. There is no history of seizures.  The patient has a history of coronary artery disease, no recent episodes of angina or shortness of breath. The patient describes mild claudication symptoms. There is a history of hyperlipidemia which is being treated with a statin.     Current Meds  Medication Sig  . albuterol (PROVENTIL HFA;VENTOLIN HFA) 108 (90 Base) MCG/ACT inhaler Inhale 2 puffs into the lungs every 6 (six) hours as needed for wheezing or shortness of breath.   . clopidogrel (PLAVIX) 75 MG tablet Take 1 tablet (75 mg total) by mouth daily.  Marland Kitchen ezetimibe (ZETIA) 10 MG tablet Take 10 mg by mouth 3 (three) times a week.   . fluticasone (FLONASE) 50 MCG/ACT nasal spray Place 2 sprays into both nostrils daily.  . hydrocortisone 2.5 % cream Apply 1 application topically 2 (two) times daily.  Marland Kitchen ipratropium (ATROVENT) 0.03 % nasal spray Place 2 sprays into both nostrils 3 (three) times daily as needed for rhinitis.  . metoprolol succinate (TOPROL-XL) 25 MG 24 hr tablet Take 25 mg by mouth daily.   . mometasone (ELOCON) 0.1 % lotion Instill 4 drops in each ear at bedtime as needed for dryness or itch  . Multiple Vitamin (MULTIVITAMIN WITH MINERALS) TABS tablet Take 1 tablet by  mouth daily.  Marland Kitchen omeprazole (PRILOSEC) 20 MG capsule Take 20 mg by mouth daily.  . polycarbophil (FIBERCON) 625 MG tablet Take 625 mg by mouth daily.  Marland Kitchen SPIRIVA HANDIHALER 18 MCG inhalation capsule Place 18 mcg into inhaler and inhale daily.   . vitamin B-12 (CYANOCOBALAMIN) 1000 MCG tablet Take 1,000 mcg by mouth daily.  Marland Kitchen zolpidem (AMBIEN) 10 MG tablet Take 10 mg by mouth at bedtime as needed for sleep.     Past Medical History:  Diagnosis Date  . Bilateral hearing loss 03/03/2016  . CAD (coronary artery disease) 02/24/2014  . COPD, moderate ([place]) 08/29/2015  . Hemorrhoids 02/24/2014  . HTN (hypertension) 02/24/2014  . Hyperlipidemia, unspecified 02/24/2014  . Nephrolithiasis   . SOB (shortness of breath) on exertion 03/27/2015  . Statin intolerance 03/30/2015    Past Surgical History:  Procedure Laterality Date  . CHOLECYSTECTOMY    . TONSILLECTOMY      Social History Social History   Tobacco Use  . Smoking status: Former Research scientist (life sciences)  . Smokeless tobacco: Never Used  Substance Use Topics  . Alcohol use: No  . Drug use: No    Family History Family History  Problem Relation Age of Onset  . Bladder Cancer Neg Hx   . Prolactinoma Neg Hx   . Prostate cancer Neg Hx   . Kidney cancer Neg Hx     No Known Allergies   REVIEW OF SYSTEMS (Negative  unless checked)  Constitutional: [] Weight loss  [] Fever  [] Chills Cardiac: [] Chest pain   [] Chest pressure   [] Palpitations   [] Shortness of breath when laying flat   [] Shortness of breath with exertion. Vascular:  [x] Pain in legs with walking   [] Pain in legs at rest  [] History of DVT   [] Phlebitis   [] Swelling in legs   [] Varicose veins   [] Non-healing ulcers Pulmonary:   [] Uses home oxygen   [] Productive cough   [] Hemoptysis   [] Wheeze  [] COPD   [] Asthma Neurologic:  [x] Dizziness   [] Seizures   [] History of stroke   [] History of TIA  [] Aphasia   [] Vissual changes   [] Weakness or numbness in arm   [] Weakness or numbness in leg Musculoskeletal:    [] Joint swelling   [] Joint pain   [] Low back pain Hematologic:  [] Easy bruising  [] Easy bleeding   [] Hypercoagulable state   [] Anemic Gastrointestinal:  [] Diarrhea   [] Vomiting  [] Gastroesophageal reflux/heartburn   [] Difficulty swallowing. Genitourinary:  [] Chronic kidney disease   [] Difficult urination  [] Frequent urination   [] Blood in urine Skin:  [] Rashes   [] Ulcers  Psychological:  [] History of anxiety   []  History of major depression.  Physical Examination  Vitals:   04/01/18 1550  BP: 109/68  Pulse: 83  Resp: 16  Weight: 128 lb 9.6 oz (58.3 kg)  Height: 5\' 5"  (1.651 m)   Body mass index is 21.4 kg/m. Gen: WD/WN, NAD Head: [place]/AT, No temporalis wasting.  Ear/Nose/Throat: Hearing grossly intact, nares w/o erythema or drainage Eyes: PER, EOMI, sclera nonicteric.  Neck: Supple, no large masses.   Pulmonary:  Good air movement, no audible wheezing bilaterally, no use of accessory muscles.  Cardiac: RRR, no JVD Vascular:  Bilateral carotid bruits Vessel Right Left  Radial Palpable Palpable  Carotid Palpable Palpable  PT Not Palpable Not Palpable  DP Not Palpable Not Palpable  Gastrointestinal: Non-distended. No guarding/no peritoneal signs.  Musculoskeletal: M/S 5/5 throughout.  No deformity or atrophy.  Neurologic: CN 2-12 intact. Symmetrical.  Speech is fluent. Motor exam as listed above. Psychiatric: Judgment intact, Mood & affect appropriate for pt's clinical situation. Dermatologic: No rashes or ulcers noted.  No changes consistent with cellulitis. Lymph : No lichenification or skin changes of chronic lymphedema.  CBC Lab Results  Component Value Date   WBC 9.0 03/15/2018   HGB 16.1 03/15/2018   HCT 47.8 03/15/2018   MCV 93.5 03/15/2018   PLT 196 03/15/2018    BMET    Component Value Date/Time   NA 139 03/15/2018 1730   K 3.9 03/15/2018 1730   CL 107 03/15/2018 1730   CO2 26 03/15/2018 1730   GLUCOSE 98 03/15/2018 1730   BUN 10 03/15/2018 1730    CREATININE 0.84 03/15/2018 1730   CALCIUM 9.0 03/15/2018 1730   GFRNONAA >60 03/15/2018 1730   GFRAA >60 03/15/2018 1730   Estimated Creatinine Clearance: 56.9 mL/min (by C-G formula based on SCr of 0.84 mg/dL).  COAG No results found for: INR, PROTIME  Radiology Ct Angio Head W Or Wo Contrast  Result Date: 03/16/2018 CLINICAL DATA:  Two near syncopal episodes. Right ICA stenosis and left ICA occlusion on ultrasound. EXAM: CT ANGIOGRAPHY HEAD AND NECK TECHNIQUE: Multidetector CT imaging of the head and neck was performed using the standard protocol during bolus administration of intravenous contrast. Multiplanar CT image reconstructions and MIPs were obtained to evaluate the vascular anatomy. Carotid stenosis measurements (when applicable) are obtained utilizing NASCET criteria, using the distal internal carotid diameter  as the denominator. CONTRAST:  3mL OMNIPAQUE IOHEXOL 350 MG/ML SOLN COMPARISON:  Carotid Doppler ultrasound 03/16/2018 FINDINGS: CT HEAD FINDINGS Brain: There is no evidence of acute infarct, intracranial hemorrhage, mass, midline shift, or extra-axial fluid collection. Mild cerebral atrophy is within normal limits for age. Periventricular white matter hypodensities are nonspecific but compatible with minimal chronic small vessel ischemic disease, also considered to be within normal limits for age. Vascular: Calcified atherosclerosis at the skull base. Skull: No fracture or focal osseous lesion. Sinuses: Visualized paranasal sinuses and mastoid air cells are clear. Orbits: Bilateral cataract extraction. Review of the MIP images confirms the above findings CTA NECK FINDINGS Aortic arch: Standard 3 vessel aortic arch with diffuse atherosclerotic plaque. Calcified and soft plaque at the arch vessel origins without significant stenosis. Right carotid system: Patent with prominent calcified plaque at the carotid bifurcation resulting in 60% proximal ICA stenosis. Left carotid system: Patent  common carotid artery with soft plaque in its midportion resulting in less than 50% stenosis. Extensive calcified and soft plaque at the carotid bifurcation with occlusion of the ICA at its origin, severe stenosis of the ECA origin, and 60% stenosis of the common carotid artery just proximal to the bifurcation. Vertebral arteries: Patent and codominant without stenosis. Skeleton: Moderate to severe diffuse cervical disc degeneration. Other neck: No mass or enlarged lymph nodes. Upper chest: Severe centrilobular emphysema. Review of the MIP images confirms the above findings CTA HEAD FINDINGS Anterior circulation: The right ICA is patent from skull base to terminus with mild nonstenotic atherosclerosis. There is reconstitution of the left supraclinoid ICA via the posterior communicating artery. ACAs and MCAs are patent without evidence of proximal branch occlusion or significant proximal stenosis. No aneurysm is identified. Posterior circulation: The intracranial vertebral arteries are widely patent to the basilar. Patent PICA, AICA, and SCA origins are visualized bilaterally. The basilar artery is widely patent. There are left larger than right posterior communicating arteries. The PCAs are patent without evidence of significant proximal stenosis. No aneurysm is identified. Venous sinuses: Patent. Anatomic variants: None. Delayed phase: No abnormal enhancement. Review of the MIP images confirms the above findings IMPRESSION: 1. No evidence of acute intracranial abnormality. 2. 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 3. 60% proximal right ICA stenosis. 4. Mild intracranial atherosclerosis without significant stenosis. 5. Aortic Atherosclerosis (ICD10-I70.0) and Emphysema (ICD10-J43.9). Electronically Signed   By: Logan Bores M.D.   On: 03/16/2018 13:09   Ct Angio Neck W Or Wo Contrast  Result Date: 03/16/2018 CLINICAL DATA:  Two near syncopal episodes. Right ICA stenosis and left ICA  occlusion on ultrasound. EXAM: CT ANGIOGRAPHY HEAD AND NECK TECHNIQUE: Multidetector CT imaging of the head and neck was performed using the standard protocol during bolus administration of intravenous contrast. Multiplanar CT image reconstructions and MIPs were obtained to evaluate the vascular anatomy. Carotid stenosis measurements (when applicable) are obtained utilizing NASCET criteria, using the distal internal carotid diameter as the denominator. CONTRAST:  75mL OMNIPAQUE IOHEXOL 350 MG/ML SOLN COMPARISON:  Carotid Doppler ultrasound 03/16/2018 FINDINGS: CT HEAD FINDINGS Brain: There is no evidence of acute infarct, intracranial hemorrhage, mass, midline shift, or extra-axial fluid collection. Mild cerebral atrophy is within normal limits for age. Periventricular white matter hypodensities are nonspecific but compatible with minimal chronic small vessel ischemic disease, also considered to be within normal limits for age. Vascular: Calcified atherosclerosis at the skull base. Skull: No fracture or focal osseous lesion. Sinuses: Visualized paranasal sinuses and mastoid air cells are clear. Orbits:  Bilateral cataract extraction. Review of the MIP images confirms the above findings CTA NECK FINDINGS Aortic arch: Standard 3 vessel aortic arch with diffuse atherosclerotic plaque. Calcified and soft plaque at the arch vessel origins without significant stenosis. Right carotid system: Patent with prominent calcified plaque at the carotid bifurcation resulting in 60% proximal ICA stenosis. Left carotid system: Patent common carotid artery with soft plaque in its midportion resulting in less than 50% stenosis. Extensive calcified and soft plaque at the carotid bifurcation with occlusion of the ICA at its origin, severe stenosis of the ECA origin, and 60% stenosis of the common carotid artery just proximal to the bifurcation. Vertebral arteries: Patent and codominant without stenosis. Skeleton: Moderate to severe  diffuse cervical disc degeneration. Other neck: No mass or enlarged lymph nodes. Upper chest: Severe centrilobular emphysema. Review of the MIP images confirms the above findings CTA HEAD FINDINGS Anterior circulation: The right ICA is patent from skull base to terminus with mild nonstenotic atherosclerosis. There is reconstitution of the left supraclinoid ICA via the posterior communicating artery. ACAs and MCAs are patent without evidence of proximal branch occlusion or significant proximal stenosis. No aneurysm is identified. Posterior circulation: The intracranial vertebral arteries are widely patent to the basilar. Patent PICA, AICA, and SCA origins are visualized bilaterally. The basilar artery is widely patent. There are left larger than right posterior communicating arteries. The PCAs are patent without evidence of significant proximal stenosis. No aneurysm is identified. Venous sinuses: Patent. Anatomic variants: None. Delayed phase: No abnormal enhancement. Review of the MIP images confirms the above findings IMPRESSION: 1. No evidence of acute intracranial abnormality. 2. 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 3. 60% proximal right ICA stenosis. 4. Mild intracranial atherosclerosis without significant stenosis. 5. Aortic Atherosclerosis (ICD10-I70.0) and Emphysema (ICD10-J43.9). Electronically Signed   By: Logan Bores M.D.   On: 03/16/2018 13:09   US Carotid Bilateral  Result Date: 03/16/2018 CLINICAL DATA:  83 year old male with history syncope. Cardiovascular risk factors include hypertension, hyperlipidemia EXAM: BILATERAL CAROTID DUPLEX ULTRASOUND TECHNIQUE: Pearline Cables scale imaging, color Doppler and duplex ultrasound were performed of bilateral carotid and vertebral arteries in the neck. COMPARISON:  None. FINDINGS: Criteria: Quantification of carotid stenosis is based on velocity parameters that correlate the residual internal carotid diameter with NASCET-based stenosis  levels, using the diameter of the distal internal carotid lumen as the denominator for stenosis measurement. The following velocity measurements were obtained: RIGHT ICA:  Systolic 825 cm/sec, Diastolic 66 cm/sec CCA:  93 cm/sec SYSTOLIC ICA/CCA RATIO:  2.5 ECA:  187 cm/sec LEFT ICA: Left ICA occlusion CCA:  42 cm/sec SYSTOLIC ICA/CCA RATIO:  Not calculable ECA:  114 cm/sec Right Brachial SBP: Not acquired Left Brachial SBP: Not acquired RIGHT CAROTID ARTERY: No significant calcifications of the right common carotid artery. Intermediate waveform maintained. Moderate heterogeneous and partially calcified plaque at the right carotid bifurcation. No significant lumen shadowing. Low resistance waveform of the right ICA. No significant tortuosity. RIGHT VERTEBRAL ARTERY: Antegrade flow with low resistance waveform. LEFT CAROTID ARTERY: No significant flow in the left internal carotid artery, with trickle flow within the lumen. Atherosclerotic calcifications at the bifurcation. LEFT VERTEBRAL ARTERY:  Antegrade flow with low resistance waveform. IMPRESSION: Right: Heterogeneous and partially calcified plaque contributes to 50%-69% stenosis by established duplex criteria. Left: Left ICA occlusion, indeterminate chronicity. Signed, Dulcy Fanny. Dellia Nims, [facility] Radiology The case and results were discussed telephone at the time of interpretation on 03/16/2018 at 11:01  am with Dr. Ulice Bold MODY. Electronically Signed   By: Corrie Mckusick D.O.   On: 03/16/2018 11:01   Dg Chest Port 1 View  Result Date: 03/15/2018 CLINICAL DATA:  Weakness, fall yesterday. Intermittent dizziness. History of coronary artery disease, hypertension, heart failure. EXAM: PORTABLE CHEST 1 VIEW COMPARISON:  None. FINDINGS: Heart size and mediastinal contours are within normal limits. Atherosclerotic changes noted at the aortic arch. Lungs are clear. No pleural effusion or pneumothorax seen. No  acute or suspicious osseous finding. IMPRESSION: 1. No active disease. No evidence of pneumonia or pulmonary edema. No osseous fracture or dislocation seen. 2. Aortic atherosclerosis. Electronically Signed   By: Franki Cabot M.D.   On: 03/15/2018 19:18      Assessment/Plan 1. Bilateral carotid artery stenosis Recommend:  Given the patient's asymptomatic subcritical stenosis no further invasive testing or surgery at this time.   CTA showed 50-60% RICA and occlusion of the LICA.Marland Kitchen  Continue antiplatelet therapy as prescribed Continue management of CAD, HTN and Hyperlipidemia Healthy heart diet,  encouraged exercise at least 4 times per week Follow up in 6 months with duplex ultrasound and physical exam   - VAS US CAROTID; Future  2. PAD (peripheral artery disease) (HCC)  Recommend:  The patient has evidence of atherosclerosis of the lower extremities with claudication.  The patient does not voice lifestyle limiting changes at this point in time.  Noninvasive studies do not suggest clinically significant change.  No invasive studies, angiography or surgery at this time The patient should continue walking and begin a more formal exercise program.  The patient should continue antiplatelet therapy and aggressive treatment of the lipid abnormalities  No changes in the patient's medications at this time  The patient should continue wearing graduated compression socks 10-15 mmHg strength to control the mild edema.   - VAS [place] ABI WITH/WO TBI; Future  3. Syncope, unspecified syncope type I will ask Dr Pryor Ochoa to see him  - Ambulatory referral to ENT  4. Coronary artery disease of native artery of native heart with stable angina pectoris (HCC) Continue cardiac and antihypertensive medications as already ordered and reviewed, no changes at this time.  Continue statin as ordered and reviewed, no changes at this time  Nitrates PRN for chest pain   5. COPD, moderate ([place]) Continue  pulmonary medications and aerosols as already ordered, these medications have been reviewed and there are no changes at this time.    6. Hyperlipidemia, unspecified hyperlipidemia type Continue statin as ordered and reviewed, no changes at this time    Hortencia Pilar, MD  04/01/2018 3:58 PM

## 2018-04-02 ENCOUNTER — Encounter (INDEPENDENT_AMBULATORY_CARE_PROVIDER_SITE_OTHER): Payer: Self-pay | Admitting: Vascular Surgery

## 2018-04-02 DIAGNOSIS — I6529 Occlusion and stenosis of unspecified carotid artery: Secondary | ICD-10-CM | POA: Insufficient documentation

## 2018-04-02 DIAGNOSIS — I739 Peripheral vascular disease, unspecified: Secondary | ICD-10-CM | POA: Insufficient documentation

## 2018-04-29 ENCOUNTER — Telehealth (INDEPENDENT_AMBULATORY_CARE_PROVIDER_SITE_OTHER): Payer: Self-pay

## 2018-04-29 NOTE — Telephone Encounter (Signed)
Called the patient back to let him know that per Maudie Mercury, that it's okay for the patient to stop taking his Plavix for four days, and then to restart taking it the follow ing day after his oral procedure.

## 2018-04-29 NOTE — Telephone Encounter (Signed)
Yes. That is fine.  

## 2018-04-29 NOTE — Telephone Encounter (Signed)
Patient called and stated that he is having a oral surgery on his teeth and his doctor would like to know if he can stop taking his Plavix for four days before the procedure and then restart?  He states that he's only been on the Plavix for one month.

## 2018-05-07 DIAGNOSIS — H04123 Dry eye syndrome of bilateral lacrimal glands: Secondary | ICD-10-CM | POA: Diagnosis not present

## 2018-05-12 DIAGNOSIS — L111 Transient acantholytic dermatosis [Grover]: Secondary | ICD-10-CM | POA: Diagnosis not present

## 2018-05-12 DIAGNOSIS — Z85828 Personal history of other malignant neoplasm of skin: Secondary | ICD-10-CM | POA: Diagnosis not present

## 2018-05-12 DIAGNOSIS — L570 Actinic keratosis: Secondary | ICD-10-CM | POA: Diagnosis not present

## 2018-05-12 DIAGNOSIS — Z08 Encounter for follow-up examination after completed treatment for malignant neoplasm: Secondary | ICD-10-CM | POA: Diagnosis not present

## 2018-05-12 DIAGNOSIS — Z85820 Personal history of malignant melanoma of skin: Secondary | ICD-10-CM | POA: Diagnosis not present

## 2018-05-12 DIAGNOSIS — L821 Other seborrheic keratosis: Secondary | ICD-10-CM | POA: Diagnosis not present

## 2018-05-21 DIAGNOSIS — R0609 Other forms of dyspnea: Secondary | ICD-10-CM | POA: Diagnosis not present

## 2018-05-21 DIAGNOSIS — J449 Chronic obstructive pulmonary disease, unspecified: Secondary | ICD-10-CM | POA: Diagnosis not present

## 2018-06-22 DIAGNOSIS — I10 Essential (primary) hypertension: Secondary | ICD-10-CM | POA: Diagnosis not present

## 2018-06-22 DIAGNOSIS — F5104 Psychophysiologic insomnia: Secondary | ICD-10-CM | POA: Diagnosis not present

## 2018-06-22 DIAGNOSIS — I6522 Occlusion and stenosis of left carotid artery: Secondary | ICD-10-CM | POA: Diagnosis not present

## 2018-06-22 DIAGNOSIS — I2510 Atherosclerotic heart disease of native coronary artery without angina pectoris: Secondary | ICD-10-CM | POA: Diagnosis not present

## 2018-06-22 DIAGNOSIS — H918X3 Other specified hearing loss, bilateral: Secondary | ICD-10-CM | POA: Diagnosis not present

## 2018-06-22 DIAGNOSIS — J449 Chronic obstructive pulmonary disease, unspecified: Secondary | ICD-10-CM | POA: Diagnosis not present

## 2018-06-22 DIAGNOSIS — I739 Peripheral vascular disease, unspecified: Secondary | ICD-10-CM | POA: Diagnosis not present

## 2018-06-29 DIAGNOSIS — I739 Peripheral vascular disease, unspecified: Secondary | ICD-10-CM | POA: Diagnosis not present

## 2018-06-29 DIAGNOSIS — E785 Hyperlipidemia, unspecified: Secondary | ICD-10-CM | POA: Diagnosis not present

## 2018-06-29 DIAGNOSIS — R55 Syncope and collapse: Secondary | ICD-10-CM | POA: Diagnosis not present

## 2018-06-29 DIAGNOSIS — J449 Chronic obstructive pulmonary disease, unspecified: Secondary | ICD-10-CM | POA: Diagnosis not present

## 2018-06-29 DIAGNOSIS — I10 Essential (primary) hypertension: Secondary | ICD-10-CM | POA: Diagnosis not present

## 2018-06-29 DIAGNOSIS — Z789 Other specified health status: Secondary | ICD-10-CM | POA: Diagnosis not present

## 2018-06-29 DIAGNOSIS — I6522 Occlusion and stenosis of left carotid artery: Secondary | ICD-10-CM | POA: Diagnosis not present

## 2018-06-29 DIAGNOSIS — I2510 Atherosclerotic heart disease of native coronary artery without angina pectoris: Secondary | ICD-10-CM | POA: Diagnosis not present

## 2018-06-29 DIAGNOSIS — R0602 Shortness of breath: Secondary | ICD-10-CM | POA: Diagnosis not present

## 2018-07-26 ENCOUNTER — Emergency Department: Payer: PPO

## 2018-07-26 ENCOUNTER — Emergency Department
Admission: EM | Admit: 2018-07-26 | Discharge: 2018-07-26 | Disposition: A | Discharge status: Home / Self Care | Payer: PPO | Attending: Emergency Medicine | Admitting: Emergency Medicine

## 2018-07-26 ENCOUNTER — Other Ambulatory Visit: Payer: Self-pay

## 2018-07-26 DIAGNOSIS — Z87891 Personal history of nicotine dependence: Secondary | ICD-10-CM | POA: Insufficient documentation

## 2018-07-26 DIAGNOSIS — I110 Hypertensive heart disease with heart failure: Secondary | ICD-10-CM | POA: Diagnosis not present

## 2018-07-26 DIAGNOSIS — Z79899 Other long term (current) drug therapy: Secondary | ICD-10-CM | POA: Insufficient documentation

## 2018-07-26 DIAGNOSIS — R531 Weakness: Secondary | ICD-10-CM | POA: Insufficient documentation

## 2018-07-26 DIAGNOSIS — I6523 Occlusion and stenosis of bilateral carotid arteries: Secondary | ICD-10-CM | POA: Diagnosis not present

## 2018-07-26 DIAGNOSIS — R42 Dizziness and giddiness: Secondary | ICD-10-CM | POA: Diagnosis not present

## 2018-07-26 DIAGNOSIS — I259 Chronic ischemic heart disease, unspecified: Secondary | ICD-10-CM | POA: Diagnosis not present

## 2018-07-26 DIAGNOSIS — Z7902 Long term (current) use of antithrombotics/antiplatelets: Secondary | ICD-10-CM | POA: Diagnosis not present

## 2018-07-26 DIAGNOSIS — J449 Chronic obstructive pulmonary disease, unspecified: Secondary | ICD-10-CM | POA: Insufficient documentation

## 2018-07-26 DIAGNOSIS — R000 Tachycardia, unspecified: Secondary | ICD-10-CM | POA: Diagnosis not present

## 2018-07-26 DIAGNOSIS — R0602 Shortness of breath: Secondary | ICD-10-CM | POA: Diagnosis not present

## 2018-07-26 DIAGNOSIS — I509 Heart failure, unspecified: Secondary | ICD-10-CM | POA: Insufficient documentation

## 2018-07-26 HISTORY — DX: Heart failure, unspecified: I50.9

## 2018-07-26 LAB — URINALYSIS, COMPLETE (UACMP) WITH MICROSCOPIC
BILIRUBIN URINE: NEGATIVE
Bacteria, UA: NONE SEEN
GLUCOSE, UA: NEGATIVE mg/dL
Hgb urine dipstick: NEGATIVE
KETONES UR: NEGATIVE mg/dL
LEUKOCYTES UA: NEGATIVE
NITRITE: NEGATIVE
PH: 5 (ref 5.0–8.0)
Protein, ur: NEGATIVE mg/dL
Specific Gravity, Urine: 1.014 (ref 1.005–1.030)

## 2018-07-26 LAB — BASIC METABOLIC PANEL
Anion gap: 6 (ref 5–15)
BUN: 15 mg/dL (ref 8–23)
CALCIUM: 9 mg/dL (ref 8.9–10.3)
CO2: 26 mmol/L (ref 22–32)
CREATININE: 1.1 mg/dL (ref 0.61–1.24)
Chloride: 107 mmol/L (ref 98–111)
Glucose, Bld: 156 mg/dL — ABNORMAL HIGH (ref 70–99)
Potassium: 3.9 mmol/L (ref 3.5–5.1)
SODIUM: 139 mmol/L (ref 135–145)

## 2018-07-26 LAB — CBC
HCT: 48.6 % (ref 39.0–52.0)
Hemoglobin: 16 g/dL (ref 13.0–17.0)
MCH: 31.1 pg (ref 26.0–34.0)
MCHC: 32.9 g/dL (ref 30.0–36.0)
MCV: 94.4 fL (ref 80.0–100.0)
NRBC: 0 % (ref 0.0–0.2)
PLATELETS: 177 10*3/uL (ref 150–400)
RBC: 5.15 MIL/uL (ref 4.22–5.81)
RDW: 13.2 % (ref 11.5–15.5)
WBC: 9.8 10*3/uL (ref 4.0–10.5)

## 2018-07-26 IMAGING — CR DG CHEST 2V
1 series · 2 of 2 positions shown · non-contrast
Comparison: Radiographs [DATE].

CLINICAL DATA: Dizziness with weakness and low blood pressure
today. History of COPD.

EXAM:
CHEST - 2 VIEW

[Series 1: dg chest 2 view · 0.14mm/px · 2 of 2 slices shown]
[im 1/2]
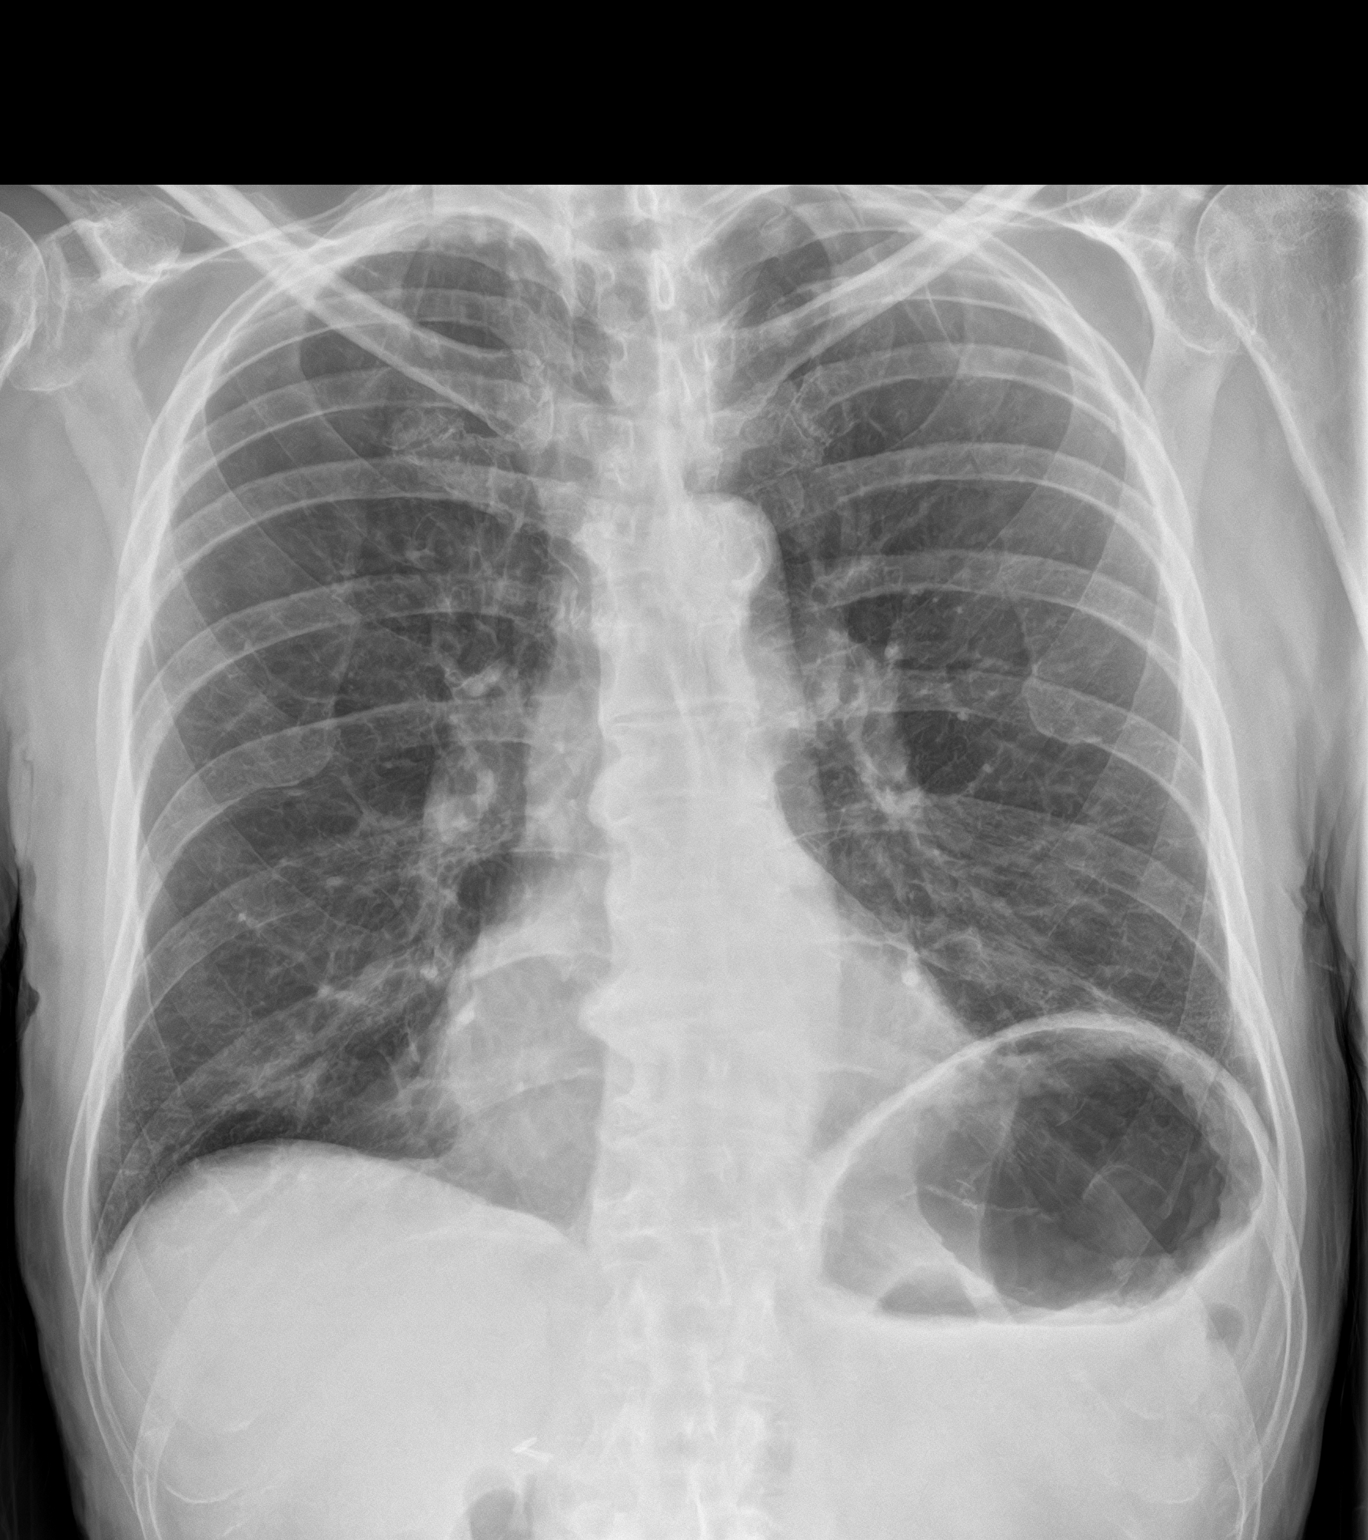
[im 2/2]
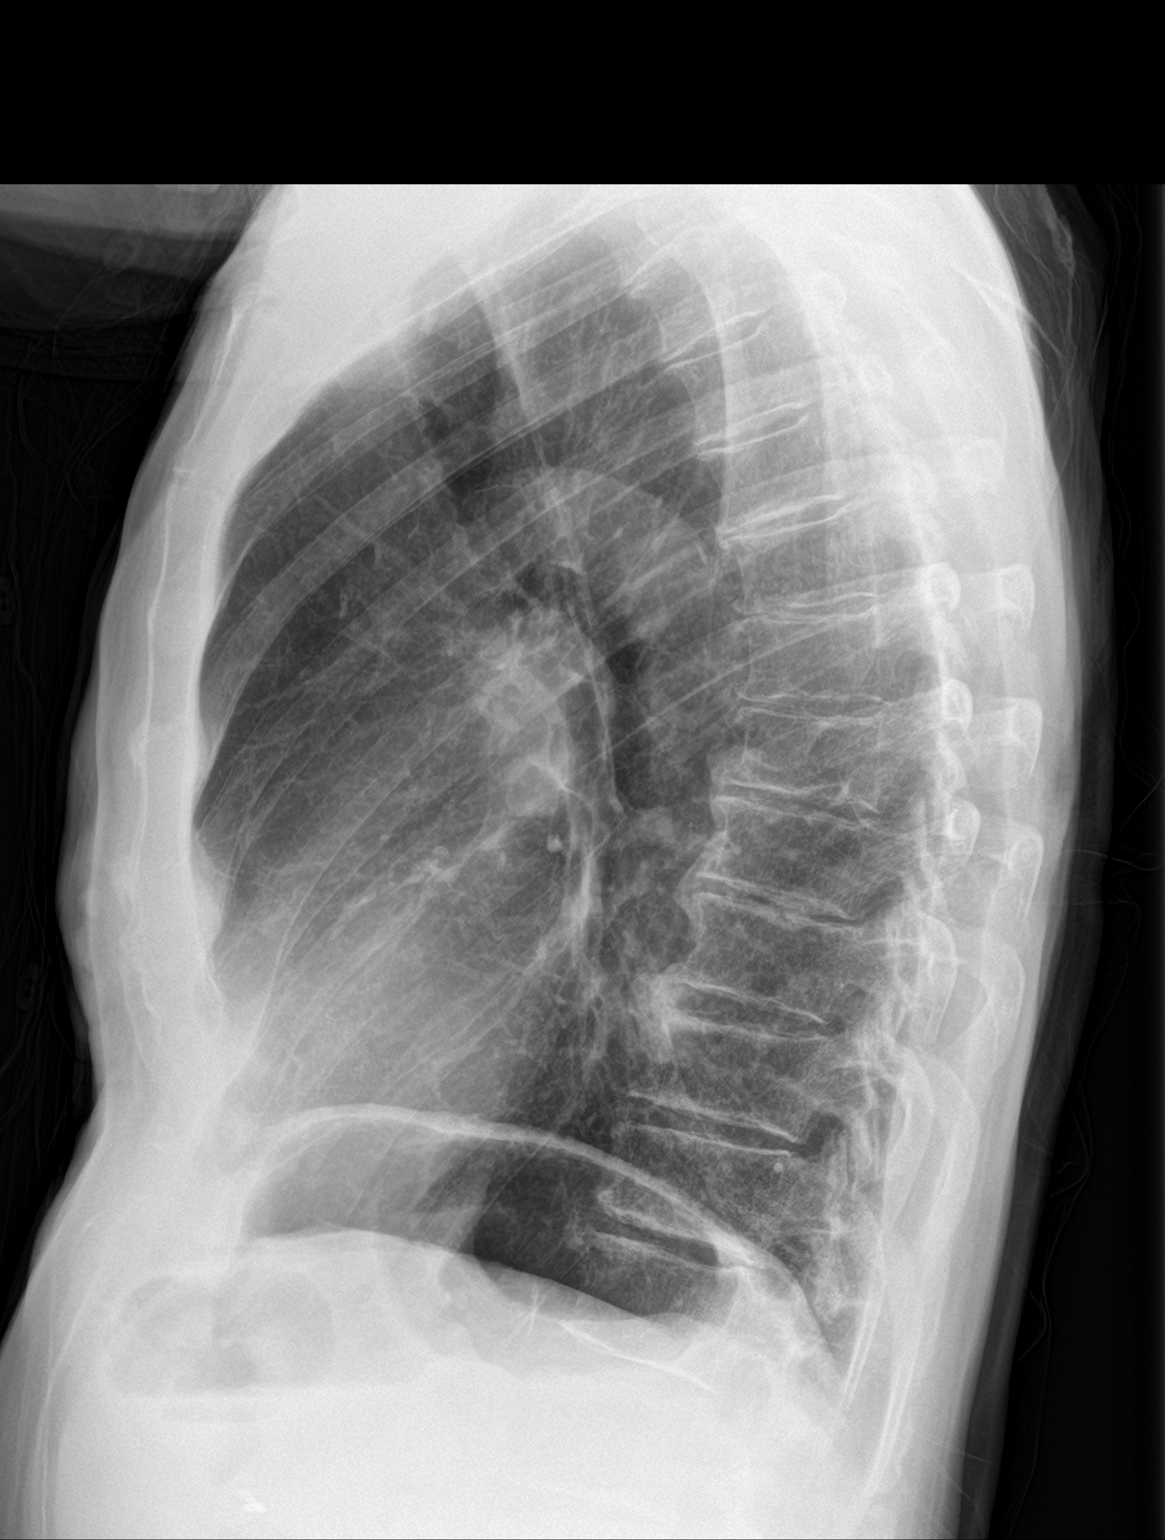

[2 of 2 positions shown; findings below may reference images not displayed]

FINDINGS: The heart size and mediastinal contours are stable. There is aortic
atherosclerosis. The lungs are clear. There is no pleural effusion
or pneumothorax. No acute osseous findings are demonstrated. Mild
degenerative changes are present throughout the spine.
IMPRESSION: Stable chest.  No active cardiopulmonary process.

## 2018-07-26 IMAGING — CT CT ANGIO CHEST
2 of 6 series · 19 of 46 positions shown · IV contrast (iopamidol)
Comparison: None.

CLINICAL DATA: Dizziness and weakness with low blood pressure and
shortness of breath.

EXAM:
CT ANGIOGRAPHY CHEST WITH CONTRAST
TECHNIQUE: Multidetector CT imaging of the chest was performed using the
standard protocol during bolus administration of intravenous
contrast. Multiplanar CT image reconstructions and MIPs were
obtained to evaluate the vascular anatomy.
CONTRAST:  75mL [BH] IOPAMIDOL ([BH]) INJECTION 76%

[Series 5: thins · axial · 0.70mm/px · z∈[-338,-65]mm · 16 of 299 slices shown]
[im 13/299  lung]
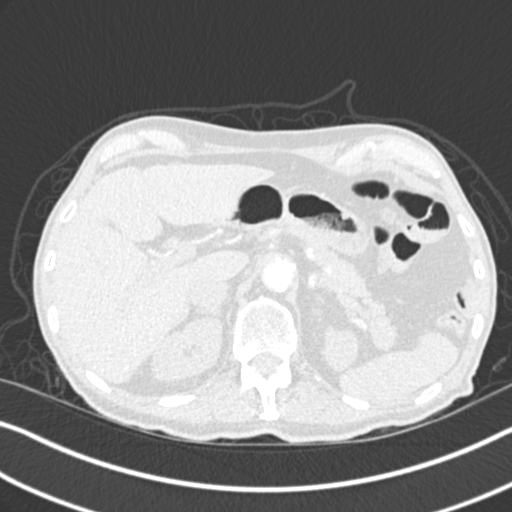
[im 39/299  soft-tissue]
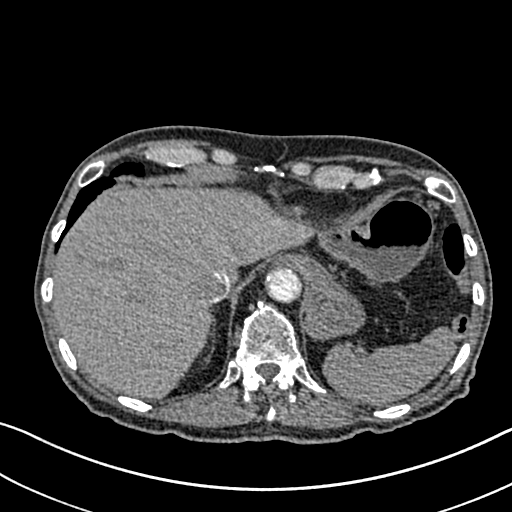
[im 52/299  lung]
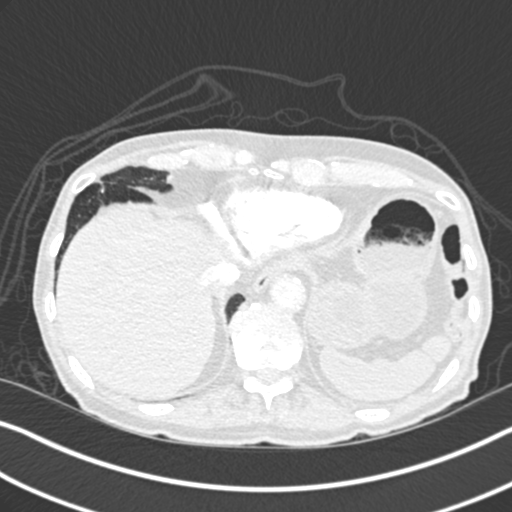
[im 65/299  soft-tissue]
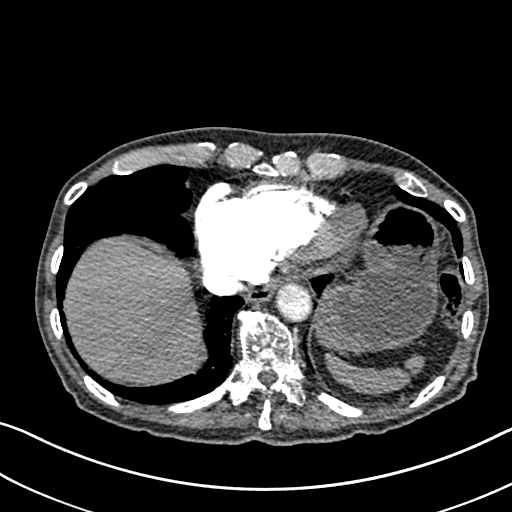
[im 91/299  lung]
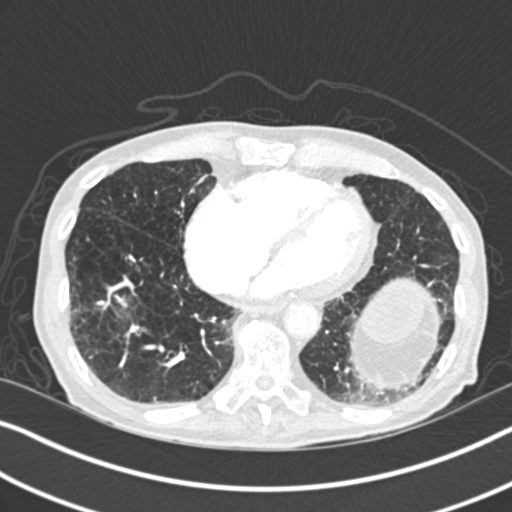
[im 104/299  soft-tissue]
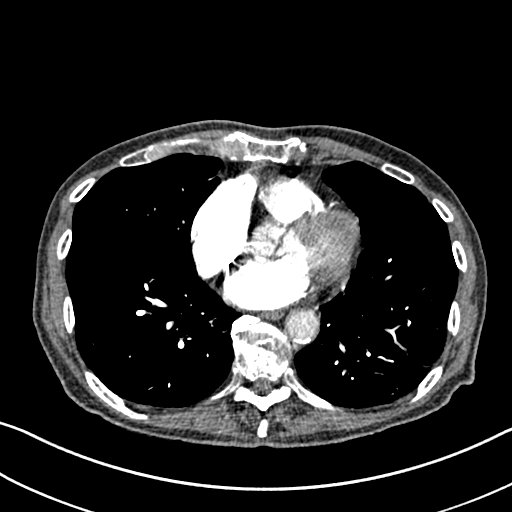
[im 117/299  lung]
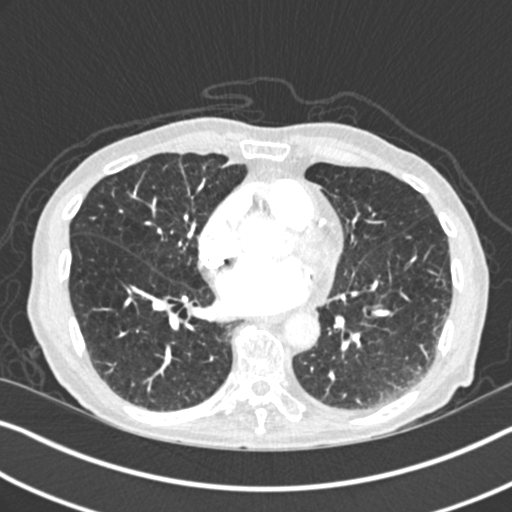
[im 143/299  soft-tissue]
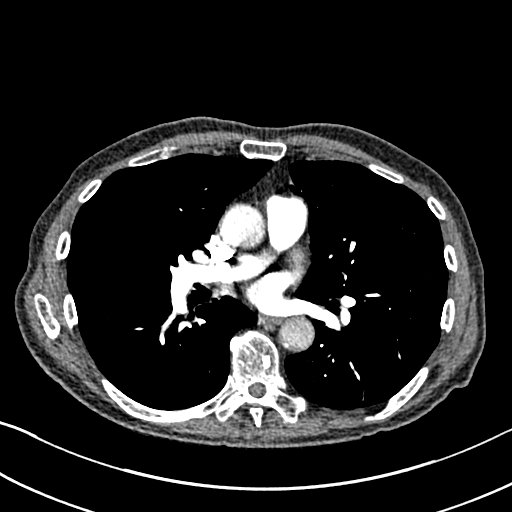
[im 156/299  lung]
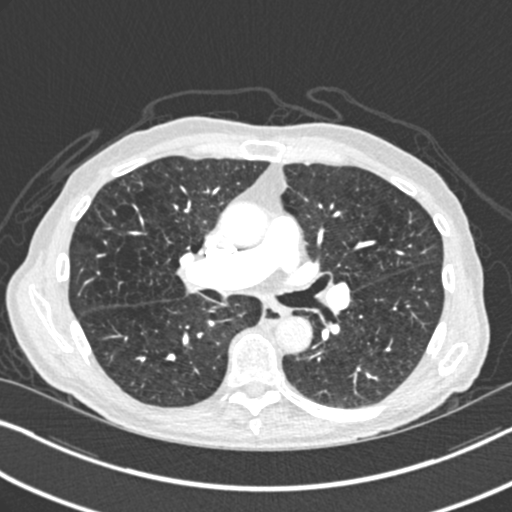
[im 182/299  soft-tissue]
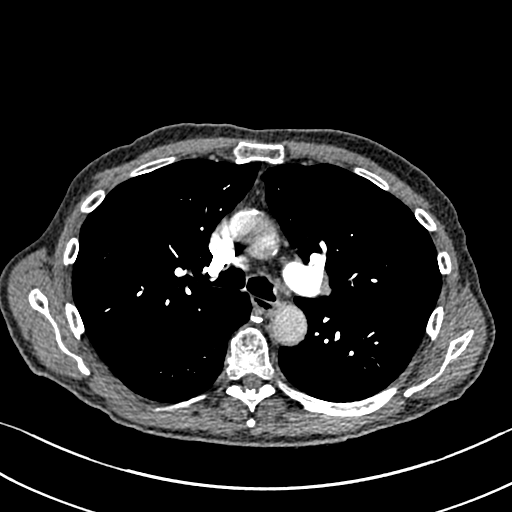
[im 195/299  lung]
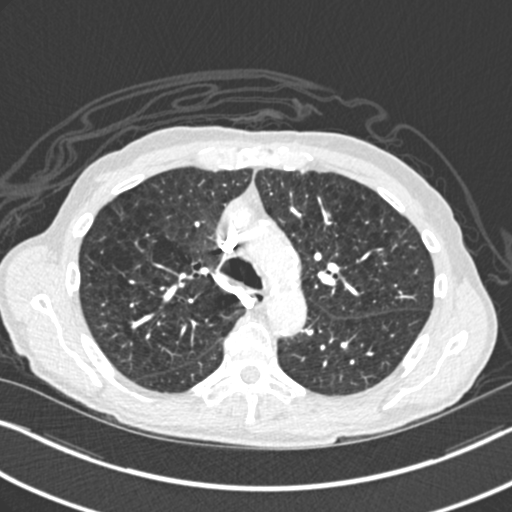
[im 208/299  soft-tissue]
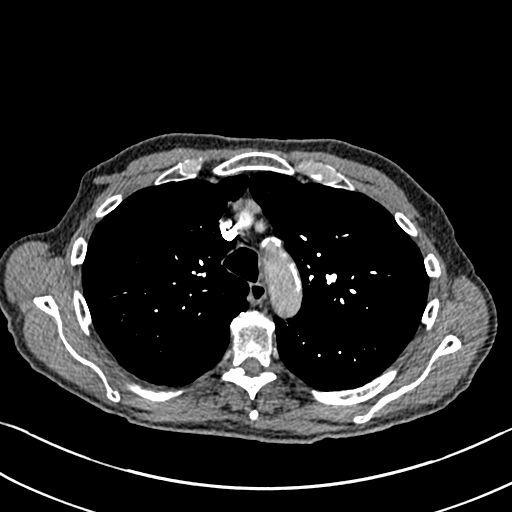
[im 234/299  lung]
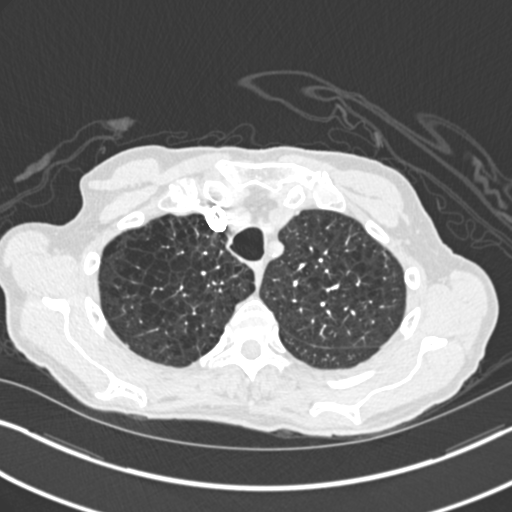
[im 247/299  soft-tissue]
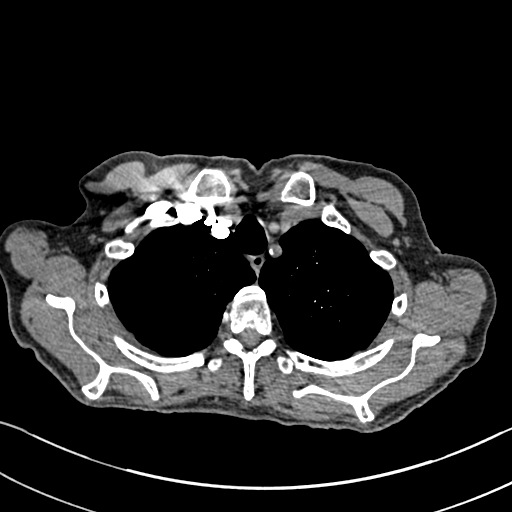
[im 260/299  lung]
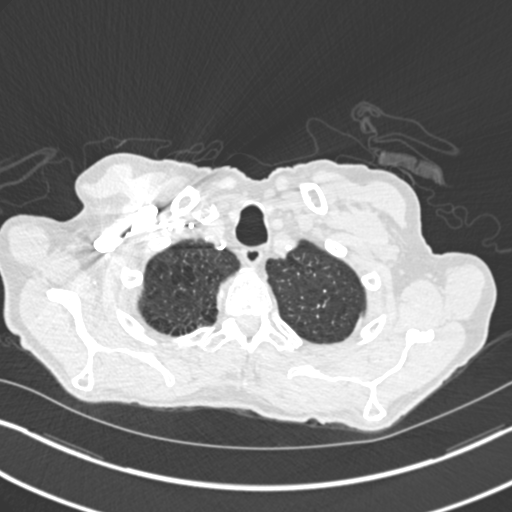
[im 286/299  soft-tissue]
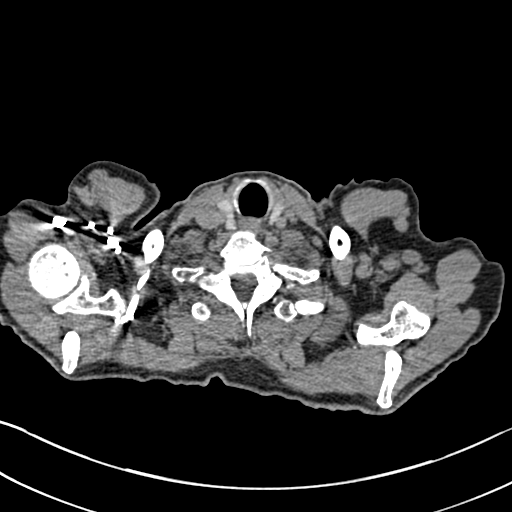

[Series 7: coronal mpr · coronal · 0.58mm/px · 3 of 79 slices shown]
[im 20/79  soft-tissue]
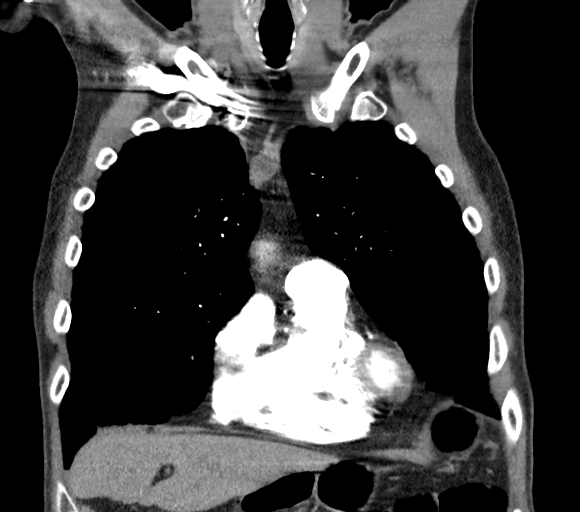
[im 40/79  soft-tissue]
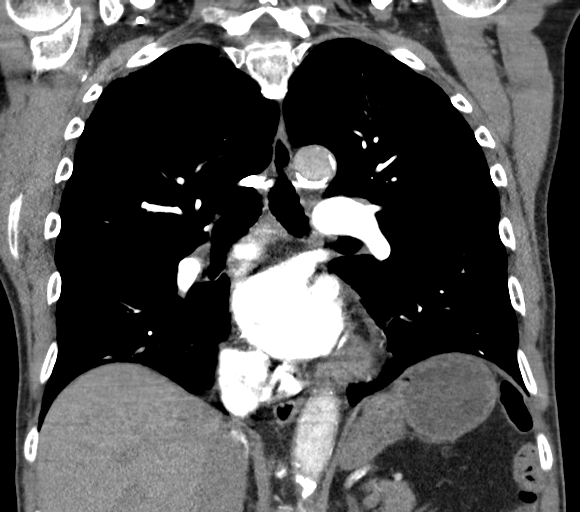
[im 59/79  soft-tissue]
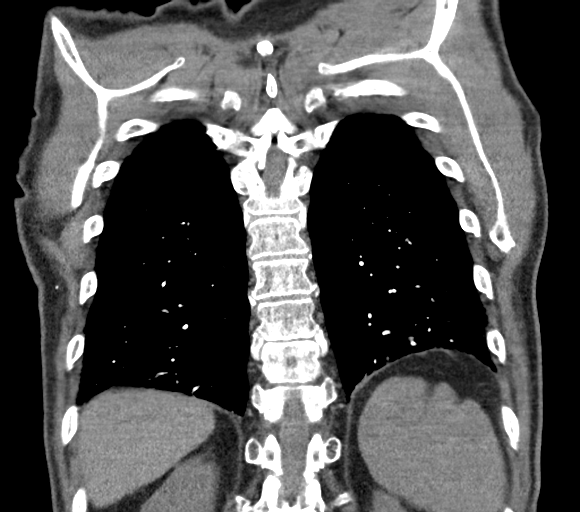

[19 of 46 positions shown; findings below may reference images not displayed]

FINDINGS: Cardiovascular: The heart size is normal. No substantial pericardial
effusion. Coronary artery calcification is evident. Atherosclerotic
calcification is noted in the wall of the thoracic aorta. No filling
defect within the opacified pulmonary arteries to suggest the
presence of an acute pulmonary embolus.

Mediastinum/Nodes: No mediastinal lymphadenopathy. There is no hilar
lymphadenopathy. Right hilar lymph node measures 8 mm short axis,
within normal limits. The esophagus has normal imaging features.
There is no axillary lymphadenopathy.

Lungs/Pleura: The central tracheobronchial airways are patent.
Centrilobular and paraseptal emphysema noted bilaterally. No focal
airspace consolidation. No pulmonary edema or pleural effusion. No
suspicious pulmonary nodule or mass.

Upper Abdomen: Unremarkable.

Musculoskeletal: No worrisome lytic or sclerotic osseous
abnormality.

Review of the MIP images confirms the above findings.
IMPRESSION: 1. No CT evidence for acute pulmonary embolus.
2.  Emphysema. ([BH]-[BH])
3.  Aortic Atherosclerois ([BH]-170.0)

## 2018-07-26 MED ORDER — IOPAMIDOL (ISOVUE-370) INJECTION 76%
75.0000 mL | Freq: Once | INTRAVENOUS | Status: AC | PRN
  Administered 2018-07-26: 75 mL via INTRAVENOUS

## 2018-07-26 NOTE — ED Notes (Addendum)
Pt to registration desk c/o wait time, informed him that there are others ahead of him that have been waiting longer, vital signs re-checked and are stable. Pt states he is feeling much better and does not have any dizziness.  Urinalysis also collected at this time.

## 2018-07-26 NOTE — Discharge Instructions (Signed)
Please call and schedule follow-up appointment with Dr. Saralyn Pilar.  Return to the emergency department immediately if your symptoms return or if you have chest pain.  Continue your medications as directed.

## 2018-07-26 NOTE — ED Provider Notes (Signed)
[facility] Emergency Department Provider Note   ____________________________________________   None    (approximate)  I have reviewed the triage vital signs and the nursing notes.   HISTORY  Chief Complaint Dizziness; Shortness of Breath; and Weakness   HPI Eric Li is a 83 y.o. male who presents to the emergency department for treatment and evaluation of shortness of breath and decreasing ability to perform ADLs. He had an episode of tachycardia with shortness of breath that started about 7:30 am and lasted until he arrived to the [facility]. He has a significant past medical history of CAD, COPD, HTN, Hyperlipidemia, Carotid Stenosis, PAD, and Syncope. During the episode earlier today, he did not have any associated chest pain, nausea, vomiting, or extremity pain. He does report significant dizziness during the time he was tachycardic, but denies syncope. His wife states that she took his BP during this time period which ranged from a systolic of 40'J to 84 at it's lowest. Currently, patient denies presenting symptoms.   Past Medical History:  Diagnosis Date  . Bilateral hearing loss 03/03/2016  . CAD (coronary artery disease) 02/24/2014  . CHF (congestive heart failure) ([place])   . COPD, moderate ([place]) 08/29/2015  . Hemorrhoids 02/24/2014  . HTN (hypertension) 02/24/2014  . Hyperlipidemia, unspecified 02/24/2014  . Nephrolithiasis   . SOB (shortness of breath) on exertion 03/27/2015  . Statin intolerance 03/30/2015    Patient Active Problem List   Diagnosis Date Noted  . Carotid stenosis 04/02/2018  . PAD (peripheral artery disease) ([place]) 04/02/2018  . Syncope 03/15/2018  . Bilateral hearing loss 03/03/2016  . COPD, moderate ([place]) 08/29/2015  . Statin intolerance 03/30/2015  . SOB (shortness of breath) on exertion 03/27/2015  . CAD (coronary artery disease) 02/24/2014  . Hemorrhoids 02/24/2014  . HTN (hypertension) 02/24/2014  . Hyperlipidemia,  unspecified 02/24/2014    Past Surgical History:  Procedure Laterality Date  . CHOLECYSTECTOMY    . TONSILLECTOMY      Prior to Admission medications   Medication Sig Start Date End Date Taking? Authorizing Provider  albuterol (PROVENTIL HFA;VENTOLIN HFA) 108 (90 Base) MCG/ACT inhaler Inhale 2 puffs into the lungs every 6 (six) hours as needed for wheezing or shortness of breath.     [provider]  clopidogrel (PLAVIX) 75 MG tablet Take 1 tablet (75 mg total) by mouth daily. 04/01/18   Schnier, Dolores Lory, MD  ezetimibe (ZETIA) 10 MG tablet Take 10 mg by mouth 3 (three) times a week.     [provider]  fluticasone (FLONASE) 50 MCG/ACT nasal spray Place 2 sprays into both nostrils daily.    [provider]  hydrocortisone 2.5 % cream Apply 1 application topically 2 (two) times daily.    [provider]  ipratropium (ATROVENT) 0.03 % nasal spray Place 2 sprays into both nostrils 3 (three) times daily as needed for rhinitis.    [provider]  metoprolol succinate (TOPROL-XL) 25 MG 24 hr tablet Take 25 mg by mouth daily.  10/20/16   [provider]  mometasone (ELOCON) 0.1 % lotion Instill 4 drops in each ear at bedtime as needed for dryness or itch    [provider]  Multiple Vitamin (MULTIVITAMIN WITH MINERALS) TABS tablet Take 1 tablet by mouth daily.    [provider]  pantoprazole (PROTONIX) 40 MG tablet Take 1 tablet (40 mg total) by mouth daily. 04/01/18   Schnier, Dolores Lory, MD  polycarbophil (FIBERCON) 625 MG  tablet Take 625 mg by mouth daily.    [provider]  SPIRIVA HANDIHALER 18 MCG inhalation capsule Place 18 mcg into inhaler and inhale daily.  10/15/16   [provider]  vitamin B-12 (CYANOCOBALAMIN) 1000 MCG tablet Take 1,000 mcg by mouth daily.    [provider]  zolpidem (AMBIEN) 10 MG tablet Take 10 mg by mouth at bedtime as needed for sleep.  10/15/16   [provider]    Allergies Patient has no known allergies.  Family History  Problem Relation Age of Onset  . Bladder Cancer Neg Hx   . Prolactinoma Neg Hx   . Prostate cancer Neg Hx   . Kidney cancer Neg Hx     Social History Social History   Tobacco Use  . Smoking status: Former Research scientist (life sciences)  . Smokeless tobacco: Never Used  Substance Use Topics  . Alcohol use: No  . Drug use: No    Review of Systems  Constitutional: No fever/chills Eyes: No visual changes. ENT: No sore throat. Cardiovascular: Denies chest pain. Respiratory: Positive for shortness of breath. Gastrointestinal: No abdominal pain.  No nausea, no vomiting.  No diarrhea.  No constipation. Genitourinary: Negative for dysuria. Musculoskeletal: Negative for back pain. Skin: Negative for rash. Neurological: Negative for headaches, focal weakness or numbness. Positive for dizziness. ____________________________________________   PHYSICAL EXAM:  VITAL SIGNS: ED Triage Vitals [07/26/18 1336]  Enc Vitals Group     BP (!) 105/59     Pulse Rate 78     Resp 18     Temp 97.6 F (36.4 C)     Temp Source Oral     SpO2 100 %     Weight 126 lb (57.2 kg)     Height 5\' 5"  (1.651 m)     Head Circumference      Peak Flow      Pain Score 0     Pain Loc      Pain Edu?      Excl. in [place]?     Constitutional: Alert and oriented. Well appearing and in no acute distress. Eyes: Conjunctivae are normal. PERRL. EOMI. Head: Atraumatic. Nose: No congestion/rhinnorhea. Mouth/Throat: Mucous membranes are moist.  Oropharynx non-erythematous. Neck: No stridor. No carotid bruit. Cardiovascular: Normal rate, regular rhythm. Grossly normal heart sounds.  Good peripheral circulation. Respiratory: Normal respiratory effort.  No retractions. Lungs CTAB. Gastrointestinal: Soft and nontender. No distention. No abdominal bruits. No CVA tenderness. Musculoskeletal: No lower extremity tenderness. No pitting edema. Negative Homan's sign.  No joint  effusions. Neurologic:  Normal speech and language. No gross focal neurologic deficits are appreciated. No gait instability. Skin:  Skin is warm, dry and intact. No rash noted. Psychiatric: Mood and affect are normal. Speech and behavior are normal.  ____________________________________________   LABS (all labs ordered are listed, but only abnormal results are displayed)  Labs Reviewed  BASIC METABOLIC PANEL - Abnormal; Notable for the following components:      Result Value   Glucose, Bld 156 (*)    All other components within normal limits  URINALYSIS, COMPLETE (UACMP) WITH MICROSCOPIC - Abnormal; Notable for the following components:   Color, Urine YELLOW (*)    APPearance CLEAR (*)    All other components within normal limits  CBC  CBG MONITORING, ED   ____________________________________________  EKG  ED ECG REPORT I, Rosabel Sermeno, FNP-BC, personally viewed and interpreted this ECG.   Date: 07/26/2018  EKG Time: 1339  Rate: 78  Rhythm:  normal EKG, normal sinus rhythm, unchanged from previous tracings, normal sinus rhythm  Axis: Normal axix  Intervals:first-degree A-V block   ST&T Change: No st elevation  ____________________________________________  RADIOLOGY  ED MD interpretation: Chest x-ray is negative for acute cardiopulmonary abnormality per radiology.  CTA negative for findings concerning for PE.  Official radiology report(s): Dg Chest 2 View  Result Date: 07/26/2018 CLINICAL DATA:  Dizziness with weakness and low blood pressure today. History of COPD. EXAM: CHEST - 2 VIEW COMPARISON:  Radiographs 03/15/2018. FINDINGS: The heart size and mediastinal contours are stable. There is aortic atherosclerosis. The lungs are clear. There is no pleural effusion or pneumothorax. No acute osseous findings are demonstrated. Mild degenerative changes are present throughout the spine. IMPRESSION: Stable chest.  No active cardiopulmonary process. Electronically Signed    By: Richardean Sale M.D.   On: 07/26/2018 14:25   Ct Angio Chest Pe W And/or Wo Contrast  Result Date: 07/26/2018 CLINICAL DATA:  Dizziness and weakness with low blood pressure and shortness of breath. EXAM: CT ANGIOGRAPHY CHEST WITH CONTRAST TECHNIQUE: Multidetector CT imaging of the chest was performed using the standard protocol during bolus administration of intravenous contrast. Multiplanar CT image reconstructions and MIPs were obtained to evaluate the vascular anatomy. CONTRAST:  5mL ISOVUE-370 IOPAMIDOL (ISOVUE-370) INJECTION 76% COMPARISON:  None. FINDINGS: Cardiovascular: The heart size is normal. No substantial pericardial effusion. Coronary artery calcification is evident. Atherosclerotic calcification is noted in the wall of the thoracic aorta. No filling defect within the opacified pulmonary arteries to suggest the presence of an acute pulmonary embolus. Mediastinum/Nodes: No mediastinal lymphadenopathy. There is no hilar lymphadenopathy. Right hilar lymph node measures 8 mm short axis, within normal limits. The esophagus has normal imaging features. There is no axillary lymphadenopathy. Lungs/Pleura: The central tracheobronchial airways are patent. Centrilobular and paraseptal emphysema noted bilaterally. No focal airspace consolidation. No pulmonary edema or pleural effusion. No suspicious pulmonary nodule or mass. Upper Abdomen: Unremarkable. Musculoskeletal: No worrisome lytic or sclerotic osseous abnormality. Review of the MIP images confirms the above findings. IMPRESSION: 1. No CT evidence for acute pulmonary embolus. 2.  Emphysema. (ICD10-J43.9) 3.  Aortic Atherosclerois (ICD10-170.0) Electronically Signed   By: Misty Stanley M.D.   On: 07/26/2018 19:07    ____________________________________________   PROCEDURES  Procedure(s) performed: None  Procedures  Critical Care performed: No  ____________________________________________   INITIAL IMPRESSION / ASSESSMENT AND PLAN /  ED COURSE  As part of my medical decision making, I reviewed the following data within the electronic MEDICAL RECORD NUMBER Notes from prior ED visits   83 year old male presenting to the emergency department for evaluation and treatment of dizziness, shortness of breath, and tachycardia that started this morning. While awaiting an ER bed assignment, his symptoms resolved. Symptoms have been intermittent since June of this year when he had a syncopal episode. Since then, he has had follow up with Dr. Saralyn Pilar. LVEF 67% and 24 hour Holter monitor showed NSR with occasional PAC and infrequent brief atrial runs. Stress testing showed no ECG changes despite severe shortness of breath. He also has an occlusion of the left carotid artery and 6% stenosis in the right. He is followed by Dr. Ronalee Belts.  ----------------------------------------- 8:12 PM on 07/26/2018 -----------------------------------------  CTA is negative for PE.  Patient continues to deny feeling of shortness of breath.  No tachycardia has been noted on the monitor.  Lab studies and images were discussed with the patient and family.  He will be discharged home tonight  after he was provided strict ER return precautions.  He is to call Dr. Kristeen Miss office in the morning to see if his appointment previously scheduled for later this month can be moved this week.  Patient and family verbalized understanding and agreement with this plan.     ____________________________________________   FINAL CLINICAL IMPRESSION(S) / ED DIAGNOSES  Final diagnoses:  Dizziness  Shortness of breath  Tachycardia     ED Discharge Orders    None       Note:  This document was prepared using Dragon voice recognition software and may include unintentional dictation errors.    Victorino Dike, FNP 07/26/18 2013    Lavonia Drafts, MD 07/26/18 2106

## 2018-07-26 NOTE — ED Triage Notes (Signed)
C/o dizziness, weakness and low blood pressure today. C/o SOB this AM that was worse than his normal SOB with COPD per patient report. States SOB has  Resolved now. Pt alert and oriented X4, active, cooperative, pt in NAD. RR even and unlabored, color WNL.

## 2018-09-02 DIAGNOSIS — R0602 Shortness of breath: Secondary | ICD-10-CM | POA: Diagnosis not present

## 2018-09-02 DIAGNOSIS — H918X3 Other specified hearing loss, bilateral: Secondary | ICD-10-CM | POA: Diagnosis not present

## 2018-09-02 DIAGNOSIS — E785 Hyperlipidemia, unspecified: Secondary | ICD-10-CM | POA: Diagnosis not present

## 2018-09-02 DIAGNOSIS — I2510 Atherosclerotic heart disease of native coronary artery without angina pectoris: Secondary | ICD-10-CM | POA: Diagnosis not present

## 2018-09-02 DIAGNOSIS — J449 Chronic obstructive pulmonary disease, unspecified: Secondary | ICD-10-CM | POA: Diagnosis not present

## 2018-09-02 DIAGNOSIS — I10 Essential (primary) hypertension: Secondary | ICD-10-CM | POA: Diagnosis not present

## 2018-09-02 DIAGNOSIS — Z789 Other specified health status: Secondary | ICD-10-CM | POA: Diagnosis not present

## 2018-09-02 DIAGNOSIS — Z0000 Encounter for general adult medical examination without abnormal findings: Secondary | ICD-10-CM | POA: Diagnosis not present

## 2018-09-03 DIAGNOSIS — J449 Chronic obstructive pulmonary disease, unspecified: Secondary | ICD-10-CM | POA: Diagnosis not present

## 2018-09-09 DIAGNOSIS — J9611 Chronic respiratory failure with hypoxia: Secondary | ICD-10-CM | POA: Diagnosis not present

## 2018-09-09 DIAGNOSIS — E785 Hyperlipidemia, unspecified: Secondary | ICD-10-CM | POA: Diagnosis not present

## 2018-09-09 DIAGNOSIS — I2510 Atherosclerotic heart disease of native coronary artery without angina pectoris: Secondary | ICD-10-CM | POA: Diagnosis not present

## 2018-09-09 DIAGNOSIS — R413 Other amnesia: Secondary | ICD-10-CM | POA: Insufficient documentation

## 2018-09-09 DIAGNOSIS — I739 Peripheral vascular disease, unspecified: Secondary | ICD-10-CM | POA: Diagnosis not present

## 2018-09-09 DIAGNOSIS — Z789 Other specified health status: Secondary | ICD-10-CM | POA: Diagnosis not present

## 2018-09-09 DIAGNOSIS — J449 Chronic obstructive pulmonary disease, unspecified: Secondary | ICD-10-CM | POA: Diagnosis not present

## 2018-09-09 DIAGNOSIS — I6522 Occlusion and stenosis of left carotid artery: Secondary | ICD-10-CM | POA: Diagnosis not present

## 2018-09-23 DIAGNOSIS — J9611 Chronic respiratory failure with hypoxia: Secondary | ICD-10-CM | POA: Diagnosis not present

## 2018-09-23 DIAGNOSIS — J40 Bronchitis, not specified as acute or chronic: Secondary | ICD-10-CM | POA: Diagnosis not present

## 2018-10-04 ENCOUNTER — Encounter (INDEPENDENT_AMBULATORY_CARE_PROVIDER_SITE_OTHER): Payer: Self-pay | Admitting: Vascular Surgery

## 2018-10-04 ENCOUNTER — Ambulatory Visit (INDEPENDENT_AMBULATORY_CARE_PROVIDER_SITE_OTHER): Payer: PPO

## 2018-10-04 ENCOUNTER — Ambulatory Visit (INDEPENDENT_AMBULATORY_CARE_PROVIDER_SITE_OTHER): Payer: PPO | Admitting: Vascular Surgery

## 2018-10-04 VITALS — BP 95/64 | HR 92 | Resp 14 | Ht 65.0 in | Wt 128.2 lb

## 2018-10-04 DIAGNOSIS — I6523 Occlusion and stenosis of bilateral carotid arteries: Secondary | ICD-10-CM

## 2018-10-04 DIAGNOSIS — I25118 Atherosclerotic heart disease of native coronary artery with other forms of angina pectoris: Secondary | ICD-10-CM | POA: Diagnosis not present

## 2018-10-04 DIAGNOSIS — I10 Essential (primary) hypertension: Secondary | ICD-10-CM

## 2018-10-04 DIAGNOSIS — I739 Peripheral vascular disease, unspecified: Secondary | ICD-10-CM

## 2018-10-04 DIAGNOSIS — Z7982 Long term (current) use of aspirin: Secondary | ICD-10-CM | POA: Diagnosis not present

## 2018-10-04 DIAGNOSIS — J449 Chronic obstructive pulmonary disease, unspecified: Secondary | ICD-10-CM | POA: Diagnosis not present

## 2018-10-04 NOTE — Progress Notes (Signed)
MRN : [id number]  Eric Li is a 84 y.o. (Aug 24, 1935) male who presents with chief complaint of  Chief Complaint  Patient presents with  . Follow-up  .  History of Present Illness:   The patient is seen for follow up evaluation of carotid stenosis. The carotid stenosis was identified after admission to [facility] for syncope.  CTA showed 50-60% RICA and occlusion of the LICA.  He continue to have intermittent episodes of dizziness  The patient denies amaurosis fugax. There is no recent history of TIA symptoms or focal motor deficits. There is no prior documented CVA.  The patient is taking enteric-coated aspirin 81 mg daily plus plavix 75 mg po daily.  There is no history of migraine headaches. There is no history of seizures.  The patient is also followed for ASO with claudication which he reports is unchanged. There have been no interval changes in lower extremity symptoms. No interval shortening of the patient's claudication distance or development of rest pain symptoms. No new ulcers or wounds have occurred since the last visit.  The patient denies history of DVT, PE or superficial thrombophlebitis. The patient denies recent episodes of angina There is a history of hyperlipidemia which is being treated with a statin.   ABI Rt=1.00 and Lt=1.05   Duplex ultrasound of the of the carotid arteries shows RICA 56-25% and LICA known occlusion     Current Meds  Medication Sig  . albuterol (PROVENTIL HFA;VENTOLIN HFA) 108 (90 Base) MCG/ACT inhaler Inhale 2 puffs into the lungs every 6 (six) hours as needed for wheezing or shortness of breath.   . clopidogrel (PLAVIX) 75 MG tablet Take 1 tablet (75 mg total) by mouth daily.  . fluticasone (FLONASE) 50 MCG/ACT nasal spray Place 2 sprays into both nostrils daily.  . hydrocortisone 2.5 % cream Apply 1 application topically 2 (two) times daily.  . metoprolol succinate (TOPROL-XL) 25 MG 24 hr tablet Take 25 mg by mouth daily.   .  Multiple Vitamin (MULTIVITAMIN WITH MINERALS) TABS tablet Take 1 tablet by mouth daily.  . pantoprazole (PROTONIX) 40 MG tablet Take 1 tablet (40 mg total) by mouth daily.  . polycarbophil (FIBERCON) 625 MG tablet Take 625 mg by mouth daily.  Marland Kitchen SPIRIVA HANDIHALER 18 MCG inhalation capsule Place 18 mcg into inhaler and inhale daily.   . vitamin B-12 (CYANOCOBALAMIN) 1000 MCG tablet Take 1,000 mcg by mouth daily.  Marland Kitchen zolpidem (AMBIEN) 10 MG tablet Take 10 mg by mouth at bedtime as needed for sleep.     Past Medical History:  Diagnosis Date  . Bilateral hearing loss 03/03/2016  . CAD (coronary artery disease) 02/24/2014  . CHF (congestive heart failure) ([place])   . COPD, moderate ([place]) 08/29/2015  . Hemorrhoids 02/24/2014  . HTN (hypertension) 02/24/2014  . Hyperlipidemia, unspecified 02/24/2014  . Nephrolithiasis   . SOB (shortness of breath) on exertion 03/27/2015  . Statin intolerance 03/30/2015    Past Surgical History:  Procedure Laterality Date  . CHOLECYSTECTOMY    . TONSILLECTOMY      Social History Social History   Tobacco Use  . Smoking status: Former Research scientist (life sciences)  . Smokeless tobacco: Never Used  Substance Use Topics  . Alcohol use: No  . Drug use: No    Family History Family History  Problem Relation Age of Onset  . Bladder Cancer Neg Hx   . Prolactinoma Neg Hx   . Prostate cancer Neg Hx   . Kidney cancer Neg Hx  No Known Allergies   REVIEW OF SYSTEMS (Negative unless checked)  Constitutional: [] Weight loss  [] Fever  [] Chills Cardiac: [] Chest pain   [] Chest pressure   [] Palpitations   [] Shortness of breath when laying flat   [] Shortness of breath with exertion. Vascular:  [x] Pain in legs with walking   [] Pain in legs at rest  [] History of DVT   [] Phlebitis   [] Swelling in legs   [] Varicose veins   [] Non-healing ulcers Pulmonary:   [] Uses home oxygen   [] Productive cough   [] Hemoptysis   [] Wheeze  [x] COPD   [] Asthma Neurologic:  [] Dizziness   [] Seizures   [] History of  stroke   [] History of TIA  [] Aphasia   [] Vissual changes   [] Weakness or numbness in arm   [] Weakness or numbness in leg Musculoskeletal:   [] Joint swelling   [x] Joint pain   [] Low back pain Hematologic:  [] Easy bruising  [] Easy bleeding   [] Hypercoagulable state   [] Anemic Gastrointestinal:  [] Diarrhea   [] Vomiting  [] Gastroesophageal reflux/heartburn   [] Difficulty swallowing. Genitourinary:  [] Chronic kidney disease   [] Difficult urination  [] Frequent urination   [] Blood in urine Skin:  [] Rashes   [] Ulcers  Psychological:  [] History of anxiety   []  History of major depression.  Physical Examination  Vitals:   10/04/18 1529  BP: 95/64  Pulse: 92  Resp: 14  Weight: 128 lb 3.2 oz (58.2 kg)  Height: 5\' 5"  (1.651 m)   Body mass index is 21.33 kg/m. Gen: WD/WN, NAD Head: [place]/AT, No temporalis wasting.  Ear/Nose/Throat: Hearing grossly intact, nares w/o erythema or drainage Eyes: PER, EOMI, sclera nonicteric.  Neck: Supple, no large masses.   Pulmonary:  Good air movement, no audible wheezing bilaterally, no use of accessory muscles.  Cardiac: RRR, no JVD Vascular: bilateral carotid bruit Vessel Right Left  Radial Palpable Palpable  Carotid Palpable Palpable  PT Not Palpable Not Palpable  DP Trace Palpable Trace Palpable  Gastrointestinal: Non-distended. No guarding/no peritoneal signs.  Musculoskeletal: M/S 5/5 throughout.  No deformity or atrophy.  Neurologic: CN 2-12 intact. Symmetrical.  Speech is fluent. Motor exam as listed above. Psychiatric: Judgment intact, Mood & affect appropriate for pt's clinical situation. Dermatologic: No rashes or ulcers noted.  No changes consistent with cellulitis. Lymph : No lichenification or skin changes of chronic lymphedema.  CBC Lab Results  Component Value Date   WBC 9.8 07/26/2018   HGB 16.0 07/26/2018   HCT 48.6 07/26/2018   MCV 94.4 07/26/2018   PLT 177 07/26/2018    BMET    Component Value Date/Time   NA 139 07/26/2018 1338    K 3.9 07/26/2018 1338   CL 107 07/26/2018 1338   CO2 26 07/26/2018 1338   GLUCOSE 156 (H) 07/26/2018 1338   BUN 15 07/26/2018 1338   CREATININE 1.10 07/26/2018 1338   CALCIUM 9.0 07/26/2018 1338   GFRNONAA >60 07/26/2018 1338   GFRAA >60 07/26/2018 1338   CrCl cannot be calculated (Patient's most recent lab result is older than the maximum 21 days allowed.).  COAG No results found for: INR, PROTIME  Radiology Vas [place] Burnard Bunting With/wo Tbi  Result Date: 10/04/2018 LOWER EXTREMITY DOPPLER STUDY Indications: Peripheral artery disease.  Performing Technologist: Almira Coaster RVS  Examination Guidelines: A complete evaluation includes at minimum, Doppler waveform signals and systolic blood pressure reading at the level of bilateral brachial, anterior tibial, and posterior tibial arteries, when vessel segments are accessible. Bilateral testing is considered an integral part of a complete examination. Photoelectric Plethysmograph (PPG)  waveforms and toe systolic pressure readings are included as required and additional duplex testing as needed. Limited examinations for reoccurring indications may be performed as noted.  ABI Findings: +---------+------------------+-----+---------+--------+ Right    Rt Pressure (mmHg)IndexWaveform Comment  +---------+------------------+-----+---------+--------+ Brachial 119                                      +---------+------------------+-----+---------+--------+ ATA      123               1.00 biphasic          +---------+------------------+-----+---------+--------+ PTA                             absent   occluded +---------+------------------+-----+---------+--------+ PERO     136               1.11 triphasic         +---------+------------------+-----+---------+--------+ Great Toe72                0.59 Abnormal          +---------+------------------+-----+---------+--------+ +---------+------------------+-----+---------+--------+  Left     Lt Pressure (mmHg)IndexWaveform Comment  +---------+------------------+-----+---------+--------+ Brachial 123                                      +---------+------------------+-----+---------+--------+ ATA      125               1.02 triphasic         +---------+------------------+-----+---------+--------+ PTA                             absent   occluded +---------+------------------+-----+---------+--------+ PERO     129               1.05 triphasic         +---------+------------------+-----+---------+--------+ Great Toe80                0.65 Abnormal          +---------+------------------+-----+---------+--------+  Summary: Right: Resting right ankle-brachial index is within normal range. No evidence of significant right lower extremity arterial disease. The right toe-brachial index is abnormal. Left: Resting left ankle-brachial index is within normal range. No evidence of significant left lower extremity arterial disease. The left toe-brachial index is abnormal.  *See table(s) above for measurements and observations.  Electronically signed by Hortencia Pilar MD on 10/04/2018 at 5:09:59 PM.   Final    Vas US Carotid  Result Date: 10/04/2018 Carotid Arterial Duplex Study Indications:       Carotid artery disease. Comparison Study:  [place] on 03/16/18 at [facility]: 50-69% right ICA stenosis                    (PSV=229cm/s) & left ICA occlusion;                    CTA of neck on 03/16/18: 60% right ICA stenosis & left ICA                    occlusion Performing Technologist: Blondell Reveal RT, RDMS, RVT  Examination Guidelines: A complete evaluation includes B-mode imaging, spectral Doppler, color Doppler, and power Doppler as needed of all accessible portions of  each vessel. Bilateral testing is considered an integral part of a complete examination. Limited examinations for reoccurring indications may be performed as noted.  Right Carotid Findings:  +----------+--------+--------+--------+------------+--------+           PSV cm/sEDV cm/sStenosisDescribe    Comments +----------+--------+--------+--------+------------+--------+ CCA Prox  110     14                                   +----------+--------+--------+--------+------------+--------+ CCA Mid   102     19                                   +----------+--------+--------+--------+------------+--------+ CCA Distal85      17                                   +----------+--------+--------+--------+------------+--------+ ICA Prox  185     47      40-59%  heterogenous         +----------+--------+--------+--------+------------+--------+ ICA Mid   74      26                                   +----------+--------+--------+--------+------------+--------+ ICA Distal56      23                                   +----------+--------+--------+--------+------------+--------+ ECA       123     0               heterogenous         +----------+--------+--------+--------+------------+--------+ ICA/CCA ratio=1.8  Left Carotid Findings: +----------+--------+--------+--------+------------+-------------------+           PSV cm/sEDV cm/sStenosisDescribe    Comments            +----------+--------+--------+--------+------------+-------------------+ CCA Prox  45      0                           High resistant flow +----------+--------+--------+--------+------------+-------------------+ CCA Mid   62      0                           High resistant flow +----------+--------+--------+--------+------------+-------------------+ CCA Distal33      0       <50%    heterogenousHigh resistant flow +----------+--------+--------+--------+------------+-------------------+ ICA                       Occludedheterogenous                    +----------+--------+--------+--------+------------+-------------------+ ECA       309     35      >50%    calcific                         +----------+--------+--------+--------+------------+-------------------+  Summary: Right Carotid: Velocities in the right ICA are consistent with a 40-59%                stenosis. Maximum obtained ICA velocity appears slightly less  than previously recorded when compared to last ultrasound. Left Carotid: Evidence consistent with a total occlusion of the left ICA. The               ECA appears >50% stenosed. No significant change when compared to               the previous exams. Vertebrals:  Bilateral vertebral arteries demonstrate antegrade flow. Subclavians: Normal flow hemodynamics were seen in bilateral subclavian              arteries. *See table(s) above for measurements and observations.  Electronically signed by Hortencia Pilar MD on 10/04/2018 at 5:09:55 PM.   Final      Assessment/Plan 1. Bilateral carotid artery stenosis Recommend:  Given the patient's asymptomatic subcritical stenosis no further invasive testing or surgery at this time.  Duplex ultrasound shows <60% RICA and known occlusion of the LICA.  Continue antiplatelet therapy as prescribed Continue management of CAD, HTN and Hyperlipidemia Healthy heart diet,  encouraged exercise at least 4 times per week.  Follow up in 12 months with duplex ultrasound and physical exam   - VAS US CAROTID; Future  2. PAD (peripheral artery disease) (HCC)  Recommend:  The patient has evidence of atherosclerosis of the lower extremities with claudication.  The patient does not voice lifestyle limiting changes at this point in time.  Noninvasive studies do not suggest clinically significant change.  No invasive studies, angiography or surgery at this time The patient should continue walking and begin a more formal exercise program.  The patient should continue antiplatelet therapy and aggressive treatment of the lipid abnormalities  No changes in the patient's medications at this time  The patient  should continue wearing graduated compression socks 10-15 mmHg strength to control the mild edema.   - VAS [place] ABI WITH/WO TBI; Future  3. Coronary artery disease of native artery of native heart with stable angina pectoris (HCC) Continue cardiac and antihypertensive medications as already ordered and reviewed, no changes at this time.  Continue statin as ordered and reviewed, no changes at this time  Nitrates PRN for chest pain   4. Essential hypertension Continue antihypertensive medications as already ordered, these medications have been reviewed and there are no changes at this time.   5. COPD, moderate ([place]) Continue pulmonary medications and aerosols as already ordered, these medications have been reviewed and there are no changes at this time.      Hortencia Pilar, MD  10/04/2018 9:26 PM

## 2018-10-26 DIAGNOSIS — R55 Syncope and collapse: Secondary | ICD-10-CM | POA: Diagnosis not present

## 2018-10-26 DIAGNOSIS — R000 Tachycardia, unspecified: Secondary | ICD-10-CM | POA: Diagnosis not present

## 2018-10-26 DIAGNOSIS — E785 Hyperlipidemia, unspecified: Secondary | ICD-10-CM | POA: Diagnosis not present

## 2018-10-26 DIAGNOSIS — I10 Essential (primary) hypertension: Secondary | ICD-10-CM | POA: Diagnosis not present

## 2018-10-26 DIAGNOSIS — Z789 Other specified health status: Secondary | ICD-10-CM | POA: Diagnosis not present

## 2018-10-26 DIAGNOSIS — J449 Chronic obstructive pulmonary disease, unspecified: Secondary | ICD-10-CM | POA: Diagnosis not present

## 2018-10-26 DIAGNOSIS — I2510 Atherosclerotic heart disease of native coronary artery without angina pectoris: Secondary | ICD-10-CM | POA: Diagnosis not present

## 2018-10-26 DIAGNOSIS — I480 Paroxysmal atrial fibrillation: Secondary | ICD-10-CM | POA: Diagnosis not present

## 2018-10-26 DIAGNOSIS — I739 Peripheral vascular disease, unspecified: Secondary | ICD-10-CM | POA: Diagnosis not present

## 2018-10-27 DIAGNOSIS — J449 Chronic obstructive pulmonary disease, unspecified: Secondary | ICD-10-CM | POA: Diagnosis not present

## 2018-11-04 DIAGNOSIS — J449 Chronic obstructive pulmonary disease, unspecified: Secondary | ICD-10-CM | POA: Diagnosis not present

## 2018-11-08 DIAGNOSIS — R55 Syncope and collapse: Secondary | ICD-10-CM | POA: Diagnosis not present

## 2018-11-08 DIAGNOSIS — I4892 Unspecified atrial flutter: Secondary | ICD-10-CM | POA: Insufficient documentation

## 2018-11-08 DIAGNOSIS — I483 Typical atrial flutter: Secondary | ICD-10-CM | POA: Diagnosis not present

## 2018-11-08 DIAGNOSIS — I2510 Atherosclerotic heart disease of native coronary artery without angina pectoris: Secondary | ICD-10-CM | POA: Diagnosis not present

## 2018-11-08 DIAGNOSIS — J449 Chronic obstructive pulmonary disease, unspecified: Secondary | ICD-10-CM | POA: Diagnosis not present

## 2018-11-08 DIAGNOSIS — Z789 Other specified health status: Secondary | ICD-10-CM | POA: Diagnosis not present

## 2018-11-08 DIAGNOSIS — E785 Hyperlipidemia, unspecified: Secondary | ICD-10-CM | POA: Diagnosis not present

## 2018-11-08 DIAGNOSIS — R0602 Shortness of breath: Secondary | ICD-10-CM | POA: Diagnosis not present

## 2018-11-08 DIAGNOSIS — I739 Peripheral vascular disease, unspecified: Secondary | ICD-10-CM | POA: Diagnosis not present

## 2018-11-08 DIAGNOSIS — I10 Essential (primary) hypertension: Secondary | ICD-10-CM | POA: Diagnosis not present

## 2018-11-18 DIAGNOSIS — I10 Essential (primary) hypertension: Secondary | ICD-10-CM | POA: Diagnosis not present

## 2018-11-18 DIAGNOSIS — I2510 Atherosclerotic heart disease of native coronary artery without angina pectoris: Secondary | ICD-10-CM | POA: Diagnosis not present

## 2018-11-18 DIAGNOSIS — I6522 Occlusion and stenosis of left carotid artery: Secondary | ICD-10-CM | POA: Diagnosis not present

## 2018-11-18 DIAGNOSIS — R55 Syncope and collapse: Secondary | ICD-10-CM | POA: Diagnosis not present

## 2018-11-18 DIAGNOSIS — Z789 Other specified health status: Secondary | ICD-10-CM | POA: Diagnosis not present

## 2018-11-18 DIAGNOSIS — I739 Peripheral vascular disease, unspecified: Secondary | ICD-10-CM | POA: Diagnosis not present

## 2018-11-18 DIAGNOSIS — J449 Chronic obstructive pulmonary disease, unspecified: Secondary | ICD-10-CM | POA: Diagnosis not present

## 2018-11-18 DIAGNOSIS — I483 Typical atrial flutter: Secondary | ICD-10-CM | POA: Diagnosis not present

## 2018-11-18 DIAGNOSIS — E785 Hyperlipidemia, unspecified: Secondary | ICD-10-CM | POA: Diagnosis not present

## 2018-11-23 DIAGNOSIS — J449 Chronic obstructive pulmonary disease, unspecified: Secondary | ICD-10-CM | POA: Diagnosis not present

## 2018-12-03 DIAGNOSIS — J449 Chronic obstructive pulmonary disease, unspecified: Secondary | ICD-10-CM | POA: Diagnosis not present

## 2019-01-03 DIAGNOSIS — J449 Chronic obstructive pulmonary disease, unspecified: Secondary | ICD-10-CM | POA: Diagnosis not present

## 2019-01-03 DIAGNOSIS — E109 Type 1 diabetes mellitus without complications: Secondary | ICD-10-CM | POA: Diagnosis not present

## 2019-01-14 ENCOUNTER — Other Ambulatory Visit (INDEPENDENT_AMBULATORY_CARE_PROVIDER_SITE_OTHER): Payer: Self-pay | Admitting: Vascular Surgery

## 2019-02-02 DIAGNOSIS — J449 Chronic obstructive pulmonary disease, unspecified: Secondary | ICD-10-CM | POA: Diagnosis not present

## 2019-03-05 DIAGNOSIS — J449 Chronic obstructive pulmonary disease, unspecified: Secondary | ICD-10-CM | POA: Diagnosis not present

## 2019-03-07 DIAGNOSIS — J449 Chronic obstructive pulmonary disease, unspecified: Secondary | ICD-10-CM | POA: Diagnosis not present

## 2019-03-11 DIAGNOSIS — J449 Chronic obstructive pulmonary disease, unspecified: Secondary | ICD-10-CM | POA: Diagnosis not present

## 2019-03-11 DIAGNOSIS — E785 Hyperlipidemia, unspecified: Secondary | ICD-10-CM | POA: Diagnosis not present

## 2019-03-11 DIAGNOSIS — I6522 Occlusion and stenosis of left carotid artery: Secondary | ICD-10-CM | POA: Diagnosis not present

## 2019-03-11 DIAGNOSIS — Z789 Other specified health status: Secondary | ICD-10-CM | POA: Diagnosis not present

## 2019-03-11 DIAGNOSIS — J9611 Chronic respiratory failure with hypoxia: Secondary | ICD-10-CM | POA: Diagnosis not present

## 2019-03-11 DIAGNOSIS — Z125 Encounter for screening for malignant neoplasm of prostate: Secondary | ICD-10-CM | POA: Diagnosis not present

## 2019-03-11 DIAGNOSIS — I739 Peripheral vascular disease, unspecified: Secondary | ICD-10-CM | POA: Diagnosis not present

## 2019-03-11 DIAGNOSIS — I2510 Atherosclerotic heart disease of native coronary artery without angina pectoris: Secondary | ICD-10-CM | POA: Diagnosis not present

## 2019-03-18 DIAGNOSIS — F411 Generalized anxiety disorder: Secondary | ICD-10-CM | POA: Diagnosis not present

## 2019-03-18 DIAGNOSIS — H918X3 Other specified hearing loss, bilateral: Secondary | ICD-10-CM | POA: Diagnosis not present

## 2019-03-18 DIAGNOSIS — I10 Essential (primary) hypertension: Secondary | ICD-10-CM | POA: Diagnosis not present

## 2019-03-18 DIAGNOSIS — R413 Other amnesia: Secondary | ICD-10-CM | POA: Diagnosis not present

## 2019-03-18 DIAGNOSIS — Z0000 Encounter for general adult medical examination without abnormal findings: Secondary | ICD-10-CM | POA: Diagnosis not present

## 2019-03-18 DIAGNOSIS — I739 Peripheral vascular disease, unspecified: Secondary | ICD-10-CM | POA: Diagnosis not present

## 2019-03-18 DIAGNOSIS — R972 Elevated prostate specific antigen [PSA]: Secondary | ICD-10-CM | POA: Insufficient documentation

## 2019-03-18 DIAGNOSIS — I2510 Atherosclerotic heart disease of native coronary artery without angina pectoris: Secondary | ICD-10-CM | POA: Diagnosis not present

## 2019-03-18 DIAGNOSIS — F5104 Psychophysiologic insomnia: Secondary | ICD-10-CM | POA: Diagnosis not present

## 2019-03-18 DIAGNOSIS — J449 Chronic obstructive pulmonary disease, unspecified: Secondary | ICD-10-CM | POA: Diagnosis not present

## 2019-03-21 ENCOUNTER — Other Ambulatory Visit (INDEPENDENT_AMBULATORY_CARE_PROVIDER_SITE_OTHER): Payer: Self-pay | Admitting: Vascular Surgery

## 2019-03-22 ENCOUNTER — Telehealth (INDEPENDENT_AMBULATORY_CARE_PROVIDER_SITE_OTHER): Payer: Self-pay

## 2019-03-22 NOTE — Telephone Encounter (Signed)
Patient wife left a message about medication refill for Pantoprazole and I inform her we had receive the request through the system per Eulogio Ditch NP we will not be able refill medication request at this time and the patient will need to contact PCP

## 2019-03-25 ENCOUNTER — Other Ambulatory Visit: Payer: Self-pay

## 2019-03-25 ENCOUNTER — Observation Stay
Admission: EM | Admit: 2019-03-25 | Discharge: 2019-03-26 | Disposition: A | Discharge status: Home / Self Care | Payer: PPO | Attending: Internal Medicine | Admitting: Internal Medicine

## 2019-03-25 ENCOUNTER — Emergency Department: Payer: PPO

## 2019-03-25 ENCOUNTER — Encounter: Payer: Self-pay | Admitting: Emergency Medicine

## 2019-03-25 DIAGNOSIS — Z87891 Personal history of nicotine dependence: Secondary | ICD-10-CM | POA: Diagnosis not present

## 2019-03-25 DIAGNOSIS — I452 Bifascicular block: Secondary | ICD-10-CM | POA: Insufficient documentation

## 2019-03-25 DIAGNOSIS — R0789 Other chest pain: Principal | ICD-10-CM | POA: Insufficient documentation

## 2019-03-25 DIAGNOSIS — Z79899 Other long term (current) drug therapy: Secondary | ICD-10-CM | POA: Diagnosis not present

## 2019-03-25 DIAGNOSIS — I5032 Chronic diastolic (congestive) heart failure: Secondary | ICD-10-CM | POA: Diagnosis not present

## 2019-03-25 DIAGNOSIS — Z23 Encounter for immunization: Secondary | ICD-10-CM | POA: Diagnosis not present

## 2019-03-25 DIAGNOSIS — R079 Chest pain, unspecified: Secondary | ICD-10-CM | POA: Diagnosis present

## 2019-03-25 DIAGNOSIS — Z7902 Long term (current) use of antithrombotics/antiplatelets: Secondary | ICD-10-CM | POA: Diagnosis not present

## 2019-03-25 DIAGNOSIS — R001 Bradycardia, unspecified: Secondary | ICD-10-CM | POA: Diagnosis not present

## 2019-03-25 DIAGNOSIS — K219 Gastro-esophageal reflux disease without esophagitis: Secondary | ICD-10-CM | POA: Diagnosis not present

## 2019-03-25 DIAGNOSIS — I2510 Atherosclerotic heart disease of native coronary artery without angina pectoris: Secondary | ICD-10-CM | POA: Diagnosis not present

## 2019-03-25 DIAGNOSIS — R0602 Shortness of breath: Secondary | ICD-10-CM | POA: Diagnosis not present

## 2019-03-25 DIAGNOSIS — I110 Hypertensive heart disease with heart failure: Secondary | ICD-10-CM | POA: Diagnosis not present

## 2019-03-25 DIAGNOSIS — I4510 Unspecified right bundle-branch block: Secondary | ICD-10-CM | POA: Insufficient documentation

## 2019-03-25 DIAGNOSIS — I739 Peripheral vascular disease, unspecified: Secondary | ICD-10-CM | POA: Diagnosis not present

## 2019-03-25 DIAGNOSIS — Z7901 Long term (current) use of anticoagulants: Secondary | ICD-10-CM | POA: Diagnosis not present

## 2019-03-25 DIAGNOSIS — Z7951 Long term (current) use of inhaled steroids: Secondary | ICD-10-CM | POA: Insufficient documentation

## 2019-03-25 DIAGNOSIS — E785 Hyperlipidemia, unspecified: Secondary | ICD-10-CM | POA: Insufficient documentation

## 2019-03-25 DIAGNOSIS — Z1159 Encounter for screening for other viral diseases: Secondary | ICD-10-CM | POA: Diagnosis not present

## 2019-03-25 DIAGNOSIS — R0902 Hypoxemia: Secondary | ICD-10-CM | POA: Diagnosis not present

## 2019-03-25 DIAGNOSIS — I4892 Unspecified atrial flutter: Secondary | ICD-10-CM | POA: Insufficient documentation

## 2019-03-25 DIAGNOSIS — Z20828 Contact with and (suspected) exposure to other viral communicable diseases: Secondary | ICD-10-CM | POA: Diagnosis not present

## 2019-03-25 DIAGNOSIS — J449 Chronic obstructive pulmonary disease, unspecified: Secondary | ICD-10-CM | POA: Diagnosis not present

## 2019-03-25 LAB — CBC WITH DIFFERENTIAL/PLATELET
Abs Immature Granulocytes: 0.04 10*3/uL (ref 0.00–0.07)
Basophils Absolute: 0.1 10*3/uL (ref 0.0–0.1)
Basophils Relative: 1 %
Eosinophils Absolute: 0.2 10*3/uL (ref 0.0–0.5)
Eosinophils Relative: 2 %
HCT: 45.2 % (ref 39.0–52.0)
Hemoglobin: 14.9 g/dL (ref 13.0–17.0)
Immature Granulocytes: 0 %
Lymphocytes Relative: 24 %
Lymphs Abs: 2.5 10*3/uL (ref 0.7–4.0)
MCH: 30.3 pg (ref 26.0–34.0)
MCHC: 33 g/dL (ref 30.0–36.0)
MCV: 91.9 fL (ref 80.0–100.0)
Monocytes Absolute: 1.3 10*3/uL — ABNORMAL HIGH (ref 0.1–1.0)
Monocytes Relative: 12 %
Neutro Abs: 6.3 10*3/uL (ref 1.7–7.7)
Neutrophils Relative %: 61 %
Platelets: 188 10*3/uL (ref 150–400)
RBC: 4.92 MIL/uL (ref 4.22–5.81)
RDW: 13.7 % (ref 11.5–15.5)
WBC: 10.5 10*3/uL (ref 4.0–10.5)
nRBC: 0 % (ref 0.0–0.2)

## 2019-03-25 LAB — COMPREHENSIVE METABOLIC PANEL
ALT: 14 U/L (ref 0–44)
AST: 24 U/L (ref 15–41)
Albumin: 3.7 g/dL (ref 3.5–5.0)
Alkaline Phosphatase: 76 U/L (ref 38–126)
Anion gap: 12 (ref 5–15)
BUN: 16 mg/dL (ref 8–23)
CO2: 21 mmol/L — ABNORMAL LOW (ref 22–32)
Calcium: 9 mg/dL (ref 8.9–10.3)
Chloride: 106 mmol/L (ref 98–111)
Creatinine, Ser: 1.02 mg/dL (ref 0.61–1.24)
GFR calc Af Amer: >60 mL/min (ref >60)
GFR calc non Af Amer: >60 mL/min (ref >60)
Glucose, Bld: 115 mg/dL — ABNORMAL HIGH (ref 70–99)
Potassium: 4 mmol/L (ref 3.5–5.1)
Sodium: 139 mmol/L (ref 135–145)
Total Bilirubin: 1 mg/dL (ref 0.3–1.2)
Total Protein: 6.5 g/dL (ref 6.5–8.1)

## 2019-03-25 LAB — TROPONIN I (HIGH SENSITIVITY)
Troponin I (High Sensitivity): 13 ng/L (ref <18)
Troponin I (High Sensitivity): 13 ng/L (ref <18)

## 2019-03-25 LAB — LIPASE, BLOOD: Lipase: 52 U/L — ABNORMAL HIGH (ref 11–51)

## 2019-03-25 LAB — SARS CORONAVIRUS 2 BY RT PCR (HOSPITAL ORDER, PERFORMED IN ~~LOC~~ HOSPITAL LAB): SARS Coronavirus 2: NEGATIVE

## 2019-03-25 IMAGING — CR CHEST - 2 VIEW
2 series · 2 of 2 positions shown · non-contrast
Comparison: None.

CLINICAL DATA: Pain.

EXAM:
CHEST - 2 VIEW

[chest pa]
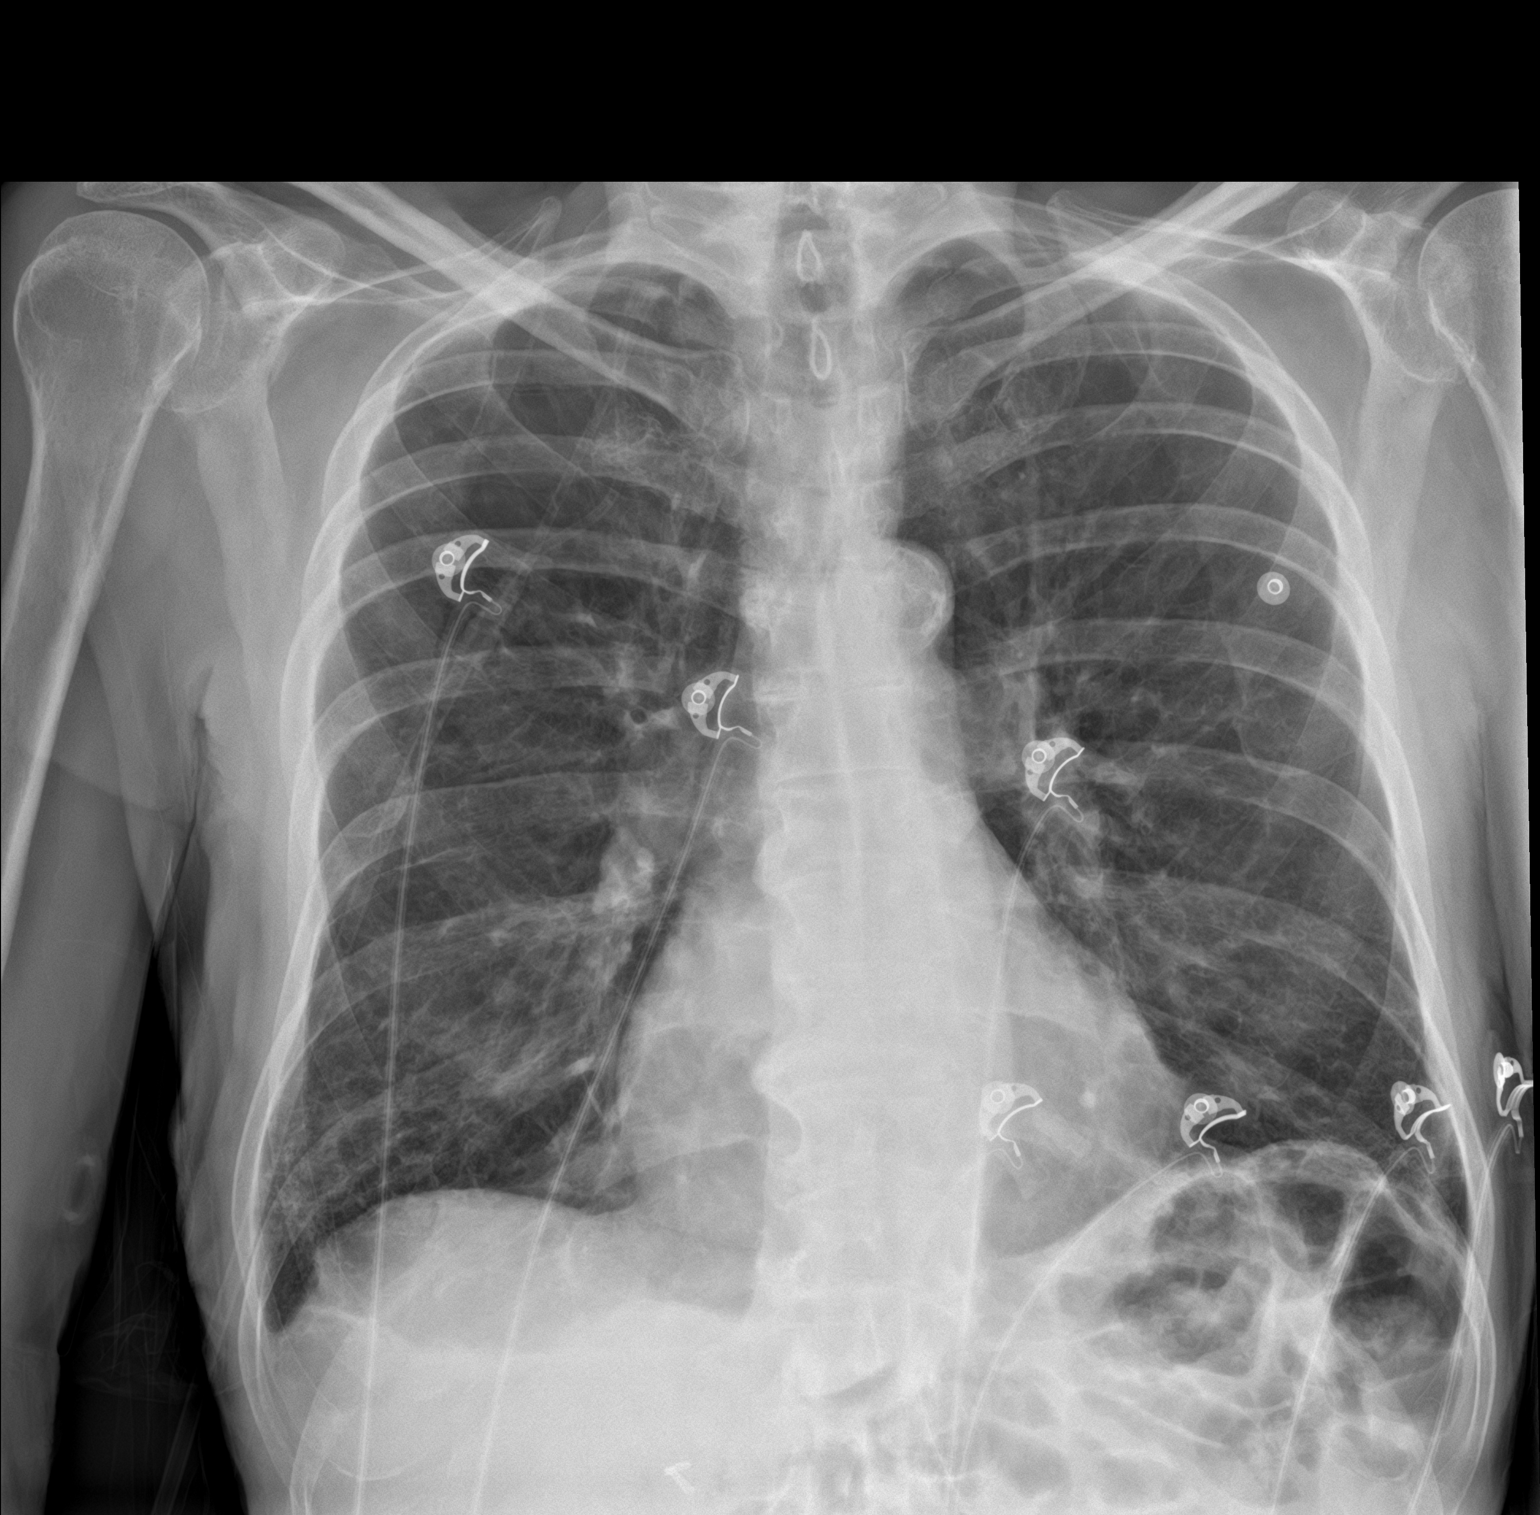

[chest lat]
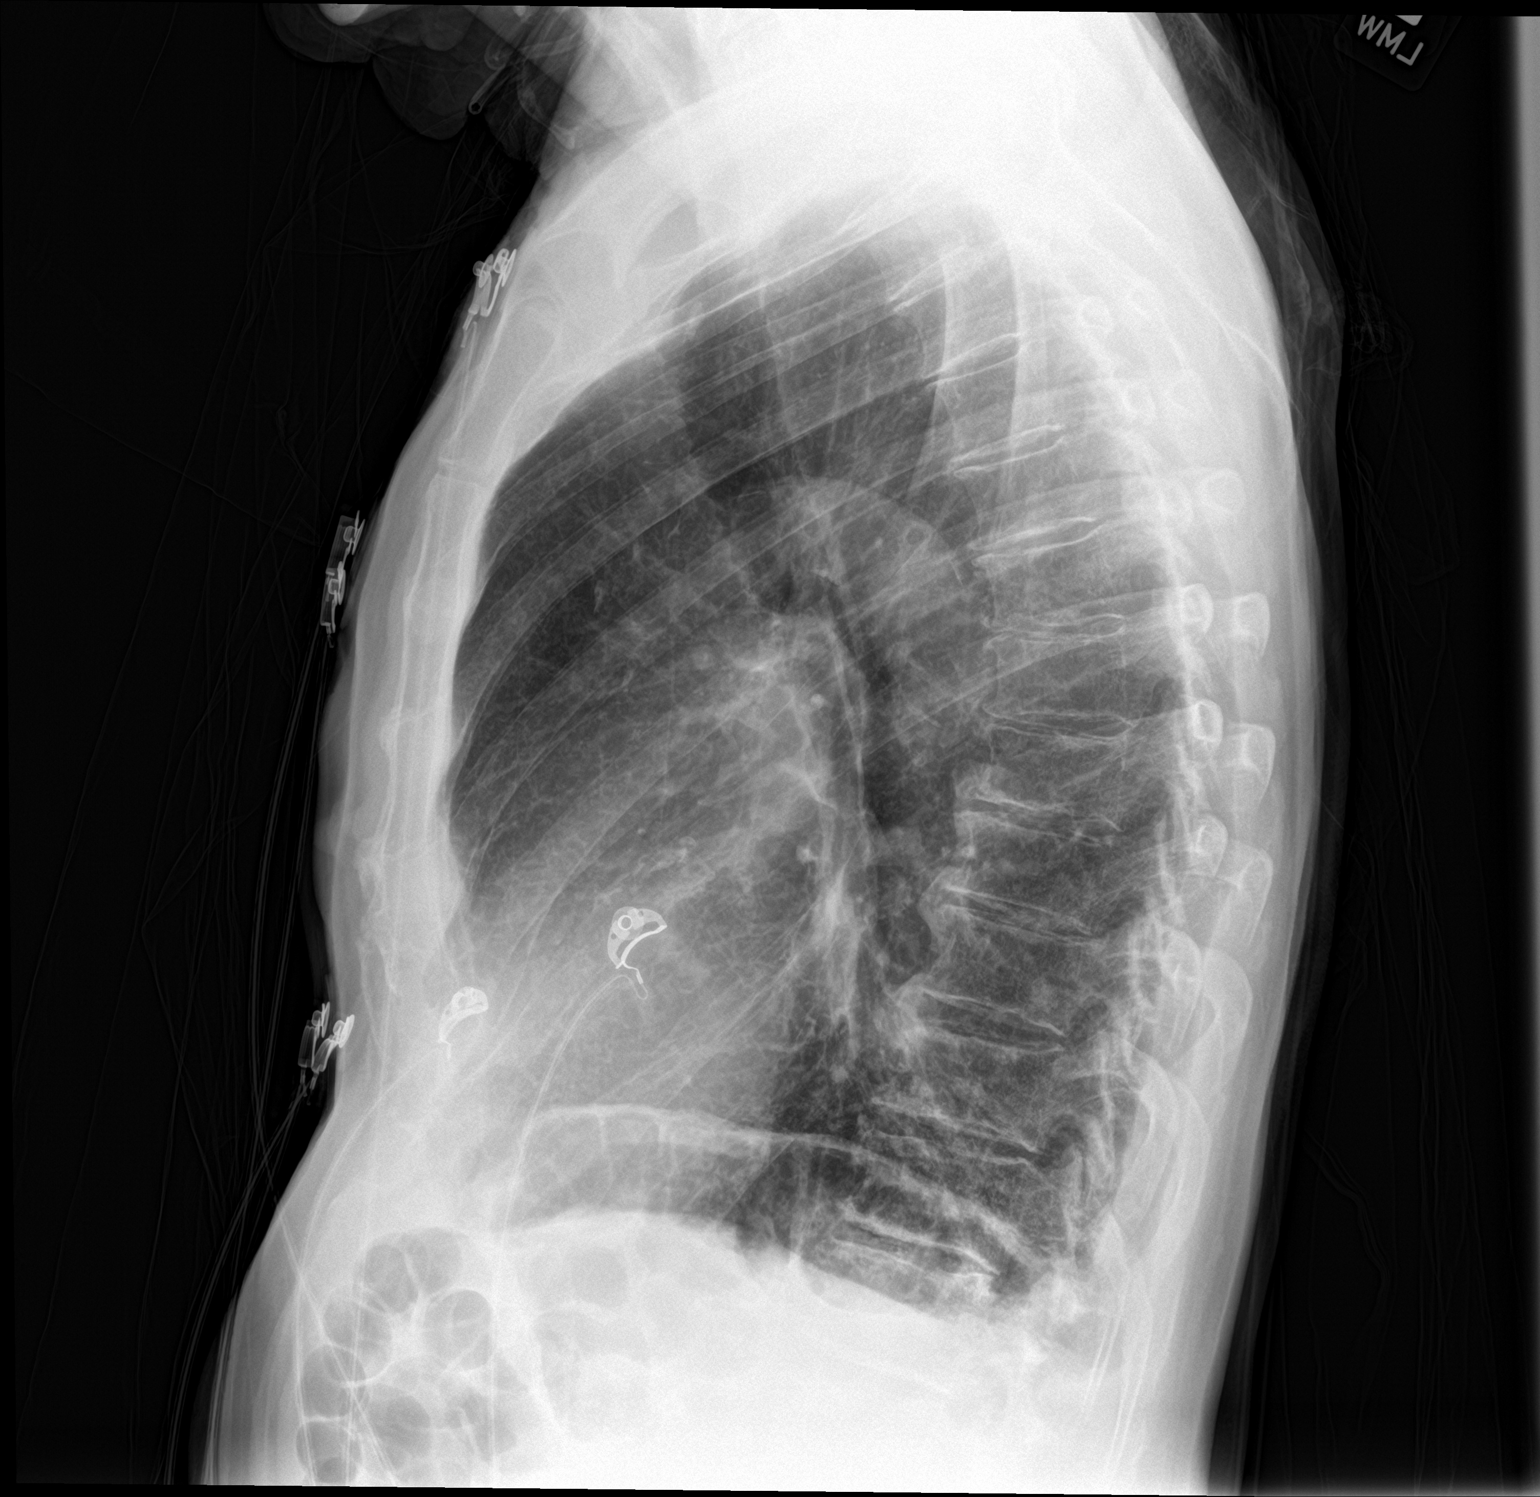

[2 of 2 positions shown; findings below may reference images not displayed]

FINDINGS: The heart size and mediastinal contours are within normal limits.
Both lungs are clear. The visualized skeletal structures are
unremarkable.
IMPRESSION: No active cardiopulmonary disease.

## 2019-03-25 MED ORDER — CLOPIDOGREL BISULFATE 75 MG PO TABS
75.0000 mg | ORAL_TABLET | Freq: Every day | ORAL | Status: DC
  Administered 2019-03-26: 75 mg via ORAL
  Filled 2019-03-25: qty 1

## 2019-03-25 MED ORDER — METOPROLOL SUCCINATE ER 50 MG PO TB24
50.0000 mg | ORAL_TABLET | Freq: Every day | ORAL | Status: DC
  Administered 2019-03-26: 50 mg via ORAL
  Filled 2019-03-25: qty 1

## 2019-03-25 MED ORDER — FLUTICASONE PROPIONATE 50 MCG/ACT NA SUSP
2.0000 | Freq: Every day | NASAL | Status: DC
  Administered 2019-03-26: 2 via NASAL
  Filled 2019-03-25: qty 16

## 2019-03-25 MED ORDER — APIXABAN 2.5 MG PO TABS
2.5000 mg | ORAL_TABLET | Freq: Two times a day (BID) | ORAL | Status: DC
  Administered 2019-03-26: 2.5 mg via ORAL
  Filled 2019-03-25 (×3): qty 1

## 2019-03-25 MED ORDER — ASPIRIN 81 MG PO CHEW: 324.0000 mg | CHEWABLE_TABLET | Freq: Once | ORAL | Status: DC

## 2019-03-25 MED ORDER — PANTOPRAZOLE SODIUM 40 MG PO TBEC
40.0000 mg | DELAYED_RELEASE_TABLET | Freq: Every day | ORAL | Status: DC
  Administered 2019-03-26: 40 mg via ORAL
  Filled 2019-03-25: qty 1

## 2019-03-25 MED ORDER — DILTIAZEM HCL ER COATED BEADS 240 MG PO CP24
240.0000 mg | ORAL_CAPSULE | Freq: Every day | ORAL | Status: DC
  Administered 2019-03-26: 240 mg via ORAL
  Filled 2019-03-25: qty 2
  Filled 2019-03-25: qty 1

## 2019-03-25 MED ORDER — ACETAMINOPHEN 325 MG PO TABS: 650.0000 mg | ORAL_TABLET | ORAL | Status: DC | PRN

## 2019-03-25 MED ORDER — ESCITALOPRAM OXALATE 10 MG PO TABS
5.0000 mg | ORAL_TABLET | Freq: Every day | ORAL | Status: DC
  Administered 2019-03-26: 5 mg via ORAL
  Filled 2019-03-25: qty 0.5

## 2019-03-25 MED ORDER — ONDANSETRON HCL 4 MG/2ML IJ SOLN: 4.0000 mg | Freq: Four times a day (QID) | INTRAMUSCULAR | Status: DC | PRN

## 2019-03-25 MED ORDER — EZETIMIBE 10 MG PO TABS
10.0000 mg | ORAL_TABLET | Freq: Every day | ORAL | Status: DC
  Administered 2019-03-26: 10 mg via ORAL
  Filled 2019-03-25: qty 1

## 2019-03-25 NOTE — ED Provider Notes (Signed)
[facility] Emergency Department Provider Note  ____________________________________________   First MD Initiated Contact with Patient 03/25/19 1908     (approximate)  I have reviewed the triage vital signs and the nursing notes.   HISTORY  Chief Complaint Chest Pain    HPI Eric Li is a 84 y.o. male with extensive past medical history as below including coronary disease, CHF, carotid stenosis, here with chest pain.  The patient states that over the last several days, he said intermittent episodes in which he thought he had severe heartburn.  However, these are not necessarily correlated with eating.  He states he is developed a mild, substernal, chest pressure, that did not necessarily relieved with antacids.  Over the last day, he has had increasing pain, as well as severe chest pressure.  He took a nitroglycerin for the first time, which immediately resolved his symptoms.  Then came back, and he took a second dose which again resolved his symptoms.  Then returned, so he called 911.  He had some mild lightheadedness.  He states his pain is now resolved and feels somewhat better.  No recent fevers or chills.  Nursing medication changes.  He was just actually seen by cardiology.        Past Medical History:  Diagnosis Date  . Bilateral hearing loss 03/03/2016  . CAD (coronary artery disease) 02/24/2014  . CHF (congestive heart failure) ([place])   . COPD, moderate ([place]) 08/29/2015  . Hemorrhoids 02/24/2014  . HTN (hypertension) 02/24/2014  . Hyperlipidemia, unspecified 02/24/2014  . Nephrolithiasis   . SOB (shortness of breath) on exertion 03/27/2015  . Statin intolerance 03/30/2015    Patient Active Problem List   Diagnosis Date Noted  . Carotid stenosis 04/02/2018  . PAD (peripheral artery disease) ([place]) 04/02/2018  . Syncope 03/15/2018  . Bilateral hearing loss 03/03/2016  . COPD, moderate ([place]) 08/29/2015  . Statin intolerance 03/30/2015  . SOB  (shortness of breath) on exertion 03/27/2015  . CAD (coronary artery disease) 02/24/2014  . Hemorrhoids 02/24/2014  . HTN (hypertension) 02/24/2014  . Hyperlipidemia, unspecified 02/24/2014    Past Surgical History:  Procedure Laterality Date  . CHOLECYSTECTOMY    . TONSILLECTOMY      Prior to Admission medications   Medication Sig Start Date End Date Taking? Authorizing Provider  albuterol (PROVENTIL) (2.5 MG/3ML) 0.083% nebulizer solution Take 2.5 mg by nebulization every 6 (six) hours as needed for wheezing or shortness of breath.   Yes [provider]  apixaban (ELIQUIS) 2.5 MG TABS tablet Take 2.5 mg by mouth 2 (two) times daily.   Yes [provider]  calcium carbonate (TUMS - DOSED IN MG ELEMENTAL CALCIUM) 500 MG chewable tablet Chew 1 tablet by mouth daily as needed for indigestion or heartburn.   Yes [provider]  diltiazem (CARDIZEM CD) 240 MG 24 hr capsule Take 240 mg by mouth daily.   Yes [provider]  escitalopram (LEXAPRO) 5 MG tablet Take 5 mg by mouth daily. 03/18/19  Yes [provider]  ezetimibe (ZETIA) 10 MG tablet Take 10 mg by mouth daily.    Yes [provider]  fluticasone (FLONASE) 50 MCG/ACT nasal spray Place 2 sprays into both nostrils daily.   Yes [provider]  ipratropium (ATROVENT) 0.03 % nasal spray Place 2 sprays into both nostrils 3 (three) times daily as needed for rhinitis (runny nose).   Yes [provider]  metoprolol succinate (TOPROL-XL) 50 MG 24 hr tablet  Take 50 mg by mouth daily.    Yes [provider]  mometasone (ELOCON) 0.1 % lotion Apply 4 drops topically at bedtime as needed (itchy dry skin in the ears).   Yes [provider]  Multiple Vitamin (MULTIVITAMIN WITH MINERALS) TABS tablet Take 1 tablet by mouth daily.   Yes [provider]  nitroGLYCERIN (NITROSTAT) 0.4 MG SL tablet Place 0.4 mg under the tongue every 5 (five) minutes as needed  for chest pain.   Yes [provider]  pantoprazole (PROTONIX) 40 MG tablet Take 1 tablet (40 mg total) by mouth daily. 04/01/18  Yes Schnier, Dolores Lory, MD  psyllium (HYDROCIL/METAMUCIL) 95 % PACK Take 1 packet by mouth daily as needed for mild constipation or moderate constipation.   Yes [provider]  zolpidem (AMBIEN) 10 MG tablet Take 10 mg by mouth at bedtime as needed for sleep.  10/15/16  Yes [provider]  clopidogrel (PLAVIX) 75 MG tablet Take 1 tablet (75 mg total) by mouth daily. Patient not taking: Reported on 03/25/2019 04/01/18   Schnier, Dolores Lory, MD    Allergies Patient has no known allergies.  Family History  Problem Relation Age of Onset  . Bladder Cancer Neg Hx   . Prolactinoma Neg Hx   . Prostate cancer Neg Hx   . Kidney cancer Neg Hx     Social History Social History   Tobacco Use  . Smoking status: Former Research scientist (life sciences)  . Smokeless tobacco: Never Used  Substance Use Topics  . Alcohol use: No  . Drug use: No    Review of Systems  Review of Systems  Constitutional: Positive for fatigue. Negative for chills and fever.  HENT: Negative for sore throat.   Respiratory: Positive for chest tightness and shortness of breath.   Cardiovascular: Positive for chest pain.  Gastrointestinal: Negative for abdominal pain.  Genitourinary: Negative for flank pain.  Musculoskeletal: Negative for neck pain.  Skin: Negative for rash and wound.  Allergic/Immunologic: Negative for immunocompromised state.  Neurological: Positive for light-headedness. Negative for weakness and numbness.  Hematological: Does not bruise/bleed easily.     ____________________________________________  PHYSICAL EXAM:      VITAL SIGNS: ED Triage Vitals [03/25/19 1914]  Enc Vitals Group     BP (!) 130/96     Pulse Rate (!) 39     Resp 18     Temp      Temp src      SpO2 95 %     Weight      Height      Head Circumference      Peak Flow      Pain Score 0      Pain Loc      Pain Edu?      Excl. in [place]?      Physical Exam Vitals signs and nursing note reviewed.  Constitutional:      General: He is not in acute distress.    Appearance: He is well-developed.  HENT:     Head: Normocephalic and atraumatic.  Eyes:     Conjunctiva/sclera: Conjunctivae normal.  Neck:     Musculoskeletal: Neck supple.  Cardiovascular:     Rate and Rhythm: Normal rate. Rhythm irregular.     Heart sounds: Normal heart sounds. No murmur. No friction rub.  Pulmonary:     Effort: Pulmonary effort is normal. No respiratory distress.     Breath sounds: Normal breath sounds. No wheezing or rales.  Abdominal:  General: There is no distension.     Palpations: Abdomen is soft.     Tenderness: There is no abdominal tenderness.  Skin:    General: Skin is warm.     Capillary Refill: Capillary refill takes less than 2 seconds.  Neurological:     Mental Status: He is alert and oriented to person, place, and time.     Motor: No abnormal muscle tone.       ____________________________________________   LABS (all labs ordered are listed, but only abnormal results are displayed)  Labs Reviewed  CBC WITH DIFFERENTIAL/PLATELET - Abnormal; Notable for the following components:      Result Value   Monocytes Absolute 1.3 (*)    All other components within normal limits  COMPREHENSIVE METABOLIC PANEL - Abnormal; Notable for the following components:   CO2 21 (*)    Glucose, Bld 115 (*)    All other components within normal limits  LIPASE, BLOOD - Abnormal; Notable for the following components:   Lipase 52 (*)    All other components within normal limits  SARS CORONAVIRUS 2 (HOSPITAL ORDER, [place] [facility])  TROPONIN I (HIGH SENSITIVITY)  TROPONIN I (HIGH SENSITIVITY)    ____________________________________________  EKG: Atrial flutter noted, ventricular rate 95.  QRS 132, QTc 513.  When compared to prior, atrial flutter appears new.  ________________________________________  RADIOLOGY All imaging, including plain films, CT scans, and ultrasounds, independently reviewed by me, and interpretations confirmed via formal radiology reads.  ED MD interpretation:   CXR: No acute abnormality  Official radiology report(s): Dg Chest 2 View  Result Date: 03/25/2019 CLINICAL DATA:  Pain. EXAM: CHEST - 2 VIEW COMPARISON:  None. FINDINGS: The heart size and mediastinal contours are within normal limits. Both lungs are clear. The visualized skeletal structures are unremarkable. IMPRESSION: No active cardiopulmonary disease. Electronically Signed   By: Dorise Bullion III M.D   On: 03/25/2019 20:00    ____________________________________________  PROCEDURES   Procedure(s) performed (including Critical Care):  Procedures  ____________________________________________  INITIAL IMPRESSION / MDM / [place] / ED COURSE  As part of my medical decision making, I reviewed the following data within the electronic MEDICAL RECORD NUMBER Notes from prior ED visits and [place] Controlled Substance Database      *Eric Li was evaluated in Emergency Department on 03/25/2019 for the symptoms described in the history of present illness. He was evaluated in the context of the global COVID-19 pandemic, which necessitated consideration that the patient might be at risk for infection with the SARS-CoV-2 virus that causes COVID-19. Institutional protocols and algorithms that pertain to the evaluation of patients at risk for COVID-19 are in a state of rapid change based on information released by regulatory bodies including the CDC and federal and state organizations. These policies and algorithms were followed during the patient's care in the ED.  Some ED evaluations and interventions may be delayed as a result of limited staffing during the pandemic.*      Medical Decision Making: 84 year old male here with fairly concerning story for  unstable angina.  Symptoms completely resolved with nitro periodically.  Although troponins are negative, heart score is 7, will admit for observation.  He has known baseline coronary disease and follows with outpatient Cardiology. He is otherwise HDS. Pain not c/w PE, dissection.  ____________________________________________  FINAL CLINICAL IMPRESSION(S) / ED DIAGNOSES  Final diagnoses:  Atypical chest pain     MEDICATIONS GIVEN DURING THIS VISIT:  Medications -  No data to display   ED Discharge Orders    None       Note:  This document was prepared using Dragon voice recognition software and may include unintentional dictation errors.   Duffy Bruce, MD 03/25/19 2258

## 2019-03-25 NOTE — ED Triage Notes (Signed)
Pt to ED from home via EMS c/o CP that started approx 49min PTA in mid chest that felt like acid reflux.  States took 2 SL nitro at home with some relief but pain returned.  Pt given 324 ASA en route with EMS.  Hx of afib, COPD, takes eliquis, wears oxygen as needed at home and presents on 3L [place].  Presents pain free, A&Ox4, chest rise even and unlabored.  Dr. Ellender Hose at bedside.

## 2019-03-25 NOTE — ED Notes (Addendum)
ED TO INPATIENT HANDOFF REPORT  ED Nurse Name and Phone #: Karena Addison [phone]  S Name/Age/Gender Minerva Areola 84 y.o. male Room/Bed: ED19A/ED19A  Code Status   Code Status: Full Code  Home/SNF/Other Home Patient oriented to: self, place, time and situation Is this baseline? Yes   Triage Complete: Triage complete  Chief Complaint CP  Triage Note Pt to ED from home via EMS c/o CP that started approx 71min PTA in mid chest that felt like acid reflux.  States took 2 SL nitro at home with some relief but pain returned.  Pt given 324 ASA en route with EMS.  Hx of afib, COPD, takes eliquis, wears oxygen as needed at home and presents on 3L [place].  Presents pain free, A&Ox4, chest rise even and unlabored.  Dr. Ellender Hose at bedside.   Allergies No Known Allergies  Level of Care/Admitting Diagnosis ED Disposition    ED Disposition Condition [place] [facility] Area: [place] [[id number]]  Level of Care: Telemetry [5]  Covid Evaluation: Confirmed COVID Negative  Diagnosis: Chest pain [884166]  Admitting Physician: Mayer Camel [[id number]]  Attending Physician: Mayer Camel [[id number]]  PT Class (Do Not Modify): Observation [104]  PT Acc Code (Do Not Modify): Observation [10022]       B Medical/Surgery History Past Medical History:  Diagnosis Date  . Bilateral hearing loss 03/03/2016  . CAD (coronary artery disease) 02/24/2014  . CHF (congestive heart failure) ([place])   . COPD, moderate ([place]) 08/29/2015  . Hemorrhoids 02/24/2014  . HTN (hypertension) 02/24/2014  . Hyperlipidemia, unspecified 02/24/2014  . Nephrolithiasis   . SOB (shortness of breath) on exertion 03/27/2015  . Statin intolerance 03/30/2015   Past Surgical History:  Procedure Laterality Date  . CHOLECYSTECTOMY    . TONSILLECTOMY       A IV Location/Drains/Wounds Patient Lines/Drains/Airways Status   Active Line/Drains/Airways    Name:   Placement date:   Placement time:   Site:   Days:   Peripheral IV 03/25/19 Left Forearm   03/25/19    1917    Forearm   less than 1          Intake/Output Last 24 hours No intake or output data in the 24 hours ending 03/25/19 2347  Labs/Imaging Results for orders placed or performed during the hospital encounter of 03/25/19 (from the past 48 hour(s))  CBC with Differential     Status: Abnormal   Collection Time: 03/25/19  7:17 PM  Result Value Ref Range   WBC 10.5 4.0 - 10.5 K/uL   RBC 4.92 4.22 - 5.81 MIL/uL   Hemoglobin 14.9 13.0 - 17.0 g/dL   HCT 45.2 39.0 - 52.0 %   MCV 91.9 80.0 - 100.0 fL   MCH 30.3 26.0 - 34.0 pg   MCHC 33.0 30.0 - 36.0 g/dL   RDW 13.7 11.5 - 15.5 %   Platelets 188 150 - 400 K/uL   nRBC 0.0 0.0 - 0.2 %   Neutrophils Relative % 61 %   Neutro Abs 6.3 1.7 - 7.7 K/uL   Lymphocytes Relative 24 %   Lymphs Abs 2.5 0.7 - 4.0 K/uL   Monocytes Relative 12 %   Monocytes Absolute 1.3 (H) 0.1 - 1.0 K/uL   Eosinophils Relative 2 %   Eosinophils Absolute 0.2 0.0 - 0.5 K/uL   Basophils Relative 1 %   Basophils Absolute 0.1 0.0 - 0.1 K/uL   Immature Granulocytes 0 %   Abs  Immature Granulocytes 0.04 0.00 - 0.07 K/uL    Comment: Performed at [facility], [address] [place], [place] [address]  Comprehensive metabolic panel     Status: Abnormal   Collection Time: 03/25/19  7:17 PM  Result Value Ref Range   Sodium 139 135 - 145 mmol/L   Potassium 4.0 3.5 - 5.1 mmol/L   Chloride 106 98 - 111 mmol/L   CO2 21 (L) 22 - 32 mmol/L   Glucose, Bld 115 (H) 70 - 99 mg/dL   BUN 16 8 - 23 mg/dL   Creatinine, Ser 1.02 0.61 - 1.24 mg/dL   Calcium 9.0 8.9 - 10.3 mg/dL   Total Protein 6.5 6.5 - 8.1 g/dL   Albumin 3.7 3.5 - 5.0 g/dL   AST 24 15 - 41 U/L   ALT 14 0 - 44 U/L   Alkaline Phosphatase 76 38 - 126 U/L   Total Bilirubin 1.0 0.3 - 1.2 mg/dL   GFR calc non Af Amer >60 >60 mL/min   GFR calc Af Amer >60 >60 mL/min   Anion gap 12 5 - 15    Comment: Performed at [facility], [address]  [place], [place] [address]  Lipase, blood     Status: Abnormal   Collection Time: 03/25/19  7:17 PM  Result Value Ref Range   Lipase 52 (H) 11 - 51 U/L    Comment: Performed at [facility], [place], [place] [address]  Troponin I (High Sensitivity)     Status: None   Collection Time: 03/25/19  7:17 PM  Result Value Ref Range   Troponin I (High Sensitivity) 13 <18 ng/L    Comment: (NOTE) Elevated high sensitivity troponin I (hsTnI) values and significant  changes across serial measurements may suggest ACS but many other  chronic and acute conditions are known to elevate hsTnI results.  Refer to the "Links" section for chest pain algorithms and additional  guidance. Performed at [facility], [address] [place], [place] [address]   SARS Coronavirus 2 (CEPHEID - Performed in [facility] hospital lab), Hosp Order     Status: None   Collection Time: 03/25/19  7:34 PM   Specimen: Nasopharyngeal Swab  Result Value Ref Range   SARS Coronavirus 2 NEGATIVE NEGATIVE    Comment: (NOTE) If result is NEGATIVE SARS-CoV-2 target nucleic acids are NOT DETECTED. The SARS-CoV-2 RNA is generally detectable in upper and lower  respiratory specimens during the acute phase of infection. The lowest  concentration of SARS-CoV-2 viral copies this assay can detect is 250  copies / mL. A negative result does not preclude SARS-CoV-2 infection  and should not be used as the sole basis for treatment or other  patient management decisions.  A negative result may occur with  improper specimen collection / handling, submission of specimen other  than nasopharyngeal swab, presence of viral mutation(s) within the  areas targeted by this assay, and inadequate number of viral copies  (<250 copies / mL). A negative result must be combined with clinical  observations, patient history, and epidemiological information. If result is POSITIVE SARS-CoV-2 target nucleic acids are  DETECTED. The SARS-CoV-2 RNA is generally detectable in upper and lower  respiratory specimens dur ing the acute phase of infection.  Positive  results are indicative of active infection with SARS-CoV-2.  Clinical  correlation with patient history and other diagnostic information is  necessary to determine patient infection status.  Positive results do  not rule out bacterial infection or co-infection with other viruses. If result is PRESUMPTIVE POSTIVE SARS-CoV-2 nucleic acids MAY BE PRESENT.   A presumptive positive result was obtained on the submitted specimen  and confirmed on repeat testing.  While 2019 novel coronavirus  (SARS-CoV-2) nucleic acids may be present in the submitted sample  additional confirmatory testing may be necessary for epidemiological  and / or clinical management purposes  to differentiate between  SARS-CoV-2 and other Sarbecovirus currently known to infect humans.  If clinically indicated additional testing with an alternate test  methodology [phone]) is advised. The SARS-CoV-2 RNA is generally  detectable in upper and lower respiratory sp ecimens during the acute  phase of infection. The expected result is Negative. Fact Sheet for Patients:  StrictlyIdeas.no Fact Sheet for Healthcare Providers: BankingDealers.co.za This test is not yet approved or cleared by the [place] FDA and has been authorized for detection and/or diagnosis of SARS-CoV-2 by FDA under an Emergency Use Authorization (EUA).  This EUA will remain in effect (meaning this test can be used) for the duration of the COVID-19 declaration under Section 564(b)(1) of the Act, 21 U.S.C. section 360bbb-3(b)(1), unless the authorization is terminated or revoked sooner. Performed at [facility], [place], [place] [address]   Troponin I (High Sensitivity)     Status: None   Collection Time: 03/25/19  8:52 PM  Result  Value Ref Range   Troponin I (High Sensitivity) 13 <18 ng/L    Comment: (NOTE) Elevated high sensitivity troponin I (hsTnI) values and significant  changes across serial measurements may suggest ACS but many other  chronic and acute conditions are known to elevate hsTnI results.  Refer to the "Links" section for chest pain algorithms and additional  guidance. Performed at [facility], [address], [place], [place] [address]    Dg Chest 2 View  Result Date: 03/25/2019 CLINICAL DATA:  Pain. EXAM: CHEST - 2 VIEW COMPARISON:  None. FINDINGS: The heart size and mediastinal contours are within normal limits. Both lungs are clear. The visualized skeletal structures are unremarkable. IMPRESSION: No active cardiopulmonary disease. Electronically Signed   By: Dorise Bullion III M.D   On: 03/25/2019 20:00    Pending Labs Unresulted Labs (From admission, onward)    Start     Ordered   03/25/19 2343  Troponin I (High Sensitivity)  STAT Now then every 2 hours,   STAT     03/25/19 2342   03/25/19 2343  Lipid panel  Once,   STAT     03/25/19 2342          Vitals/Pain Today's Vitals   03/25/19 1945 03/25/19 2100 03/25/19 2130 03/25/19 2200  BP:  130/66 122/68 134/90  Pulse: (!) 55 68 60 60  Resp: (!) 27 (!) 21 20 (!) 23  SpO2: 93% 92% 92% 90%  PainSc:        Isolation Precautions No active isolations  Medications Medications  apixaban (ELIQUIS) tablet 2.5 mg (has no administration in time range)  diltiazem (CARDIZEM CD) 24 hr capsule 240 mg (has no administration in time range)  escitalopram (LEXAPRO) tablet 5 mg (has no administration in time range)  ezetimibe (ZETIA) tablet 10 mg (has no administration in time range)  fluticasone (FLONASE) 50 MCG/ACT nasal spray 2 spray (has no administration in time range)  metoprolol succinate (TOPROL-XL) 24 hr tablet 50 mg (has no administration in time range)  pantoprazole (PROTONIX) EC tablet 40 mg (has no administration  in time  range)  clopidogrel (PLAVIX) tablet 75 mg (has no administration in time range)  acetaminophen (TYLENOL) tablet 650 mg (has no administration in time range)  ondansetron (ZOFRAN) injection 4 mg (has no administration in time range)    Mobility walks with device Low fall risk   Focused Assessments    R Recommendations: See Admitting Provider Note  Report given to: Beau Fanny, RN

## 2019-03-25 NOTE — ED Notes (Signed)
Pt up to the bathroom. Pt ambulatory independently and in NAD at this time.

## 2019-03-26 ENCOUNTER — Observation Stay
Admit: 2019-03-26 | Discharge: 2019-03-26 | Disposition: A | Discharge status: Home / Self Care | Payer: PPO | Attending: Nurse Practitioner | Admitting: Nurse Practitioner

## 2019-03-26 DIAGNOSIS — I509 Heart failure, unspecified: Secondary | ICD-10-CM | POA: Diagnosis not present

## 2019-03-26 DIAGNOSIS — R079 Chest pain, unspecified: Secondary | ICD-10-CM | POA: Diagnosis not present

## 2019-03-26 DIAGNOSIS — J449 Chronic obstructive pulmonary disease, unspecified: Secondary | ICD-10-CM | POA: Diagnosis not present

## 2019-03-26 DIAGNOSIS — E785 Hyperlipidemia, unspecified: Secondary | ICD-10-CM | POA: Diagnosis not present

## 2019-03-26 DIAGNOSIS — I25110 Atherosclerotic heart disease of native coronary artery with unstable angina pectoris: Secondary | ICD-10-CM | POA: Diagnosis not present

## 2019-03-26 DIAGNOSIS — R0600 Dyspnea, unspecified: Secondary | ICD-10-CM | POA: Diagnosis not present

## 2019-03-26 LAB — ECHOCARDIOGRAM COMPLETE
Height: 66 in
Weight: 2052.8 oz

## 2019-03-26 LAB — LIPID PANEL
Cholesterol: 131 mg/dL (ref 0–200)
HDL: 44 mg/dL (ref >40)
LDL Cholesterol: 76 mg/dL (ref 0–99)
Total CHOL/HDL Ratio: 3 RATIO
Triglycerides: 54 mg/dL (ref <150)
VLDL: 11 mg/dL (ref 0–40)

## 2019-03-26 LAB — TROPONIN I (HIGH SENSITIVITY)
Troponin I (High Sensitivity): 14 ng/L (ref <18)
Troponin I (High Sensitivity): 14 ng/L (ref <18)

## 2019-03-26 LAB — BRAIN NATRIURETIC PEPTIDE: B Natriuretic Peptide: 504 pg/mL — ABNORMAL HIGH (ref 0.0–100.0)

## 2019-03-26 MED ORDER — PANTOPRAZOLE SODIUM 40 MG PO TBEC: 40.0000 mg | DELAYED_RELEASE_TABLET | Freq: Two times a day (BID) | ORAL | 11 refills | Status: DC

## 2019-03-26 MED ORDER — IPRATROPIUM-ALBUTEROL 0.5-2.5 (3) MG/3ML IN SOLN: 3.0000 mL | Freq: Four times a day (QID) | RESPIRATORY_TRACT | Status: DC | PRN

## 2019-03-26 MED ORDER — PNEUMOCOCCAL VAC POLYVALENT 25 MCG/0.5ML IJ INJ: 0.5000 mL | INJECTION | INTRAMUSCULAR | Status: DC

## 2019-03-26 MED ORDER — PNEUMOCOCCAL VAC POLYVALENT 25 MCG/0.5ML IJ INJ
0.5000 mL | INJECTION | INTRAMUSCULAR | Status: AC | PRN
  Administered 2019-03-26: 0.5 mL via INTRAMUSCULAR
  Filled 2019-03-26: qty 0.5

## 2019-03-26 NOTE — Consult Note (Signed)
Cardiology Consultation Note    Patient ID: Eric Li, MRN: [id number], DOB/AGE: [birth year] 84 y.o. Admit date: 03/25/2019   Date of Consult: 03/26/2019 Primary Physician: [name], MD Primary Cardiologist: Dr. Saralyn Pilar  Chief Complaint: chest pain Reason for Consultation: chest pain Requesting MD: Dr. Dustin Flock  HPI: Eric Li is a 84 y.o. male with history of cad(50% LAD, 70% distal dominant lcx 6/08), PAD (occluded lica, 95% rica followed by Dr. Delana Meyer), hx of HFpEF, copd,hix of atrial flutter,  htn, hyperlidemia admitted after presenting to the er with complaints of chest pain. He has ruled out for an mi with hstroponin less than 18. He had several days of intermitant non exertional chest pain. EKG revealed atrial flutter with lafb and rbbb. CXR showed no active cardiopulmonary disease.  Patient is very limited in his activity due to COPD.  He has frequent heartburn and takes Pepcid for this.  He stated the discomfort yesterday occurred while laying down.  It was somewhat worse than usual.  He has not had any pain since leaving the house via EMS.  He has been on Eliquis at 2.5 mg daily as an anticoagulant for his atrial flutter and on Plavix 75 mg daily.  He is on Cardizem CD at 240 mg daily and metoprolol succinate 50 mg for rate control.  He is statin intolerant and is on Zetia 10 mg daily.  He states he takes Pepcid frequently at home.  He is currently pain-free.  Past Medical History:  Diagnosis Date  . Bilateral hearing loss 03/03/2016  . CAD (coronary artery disease) 02/24/2014  . CHF (congestive heart failure) ([place])   . COPD, moderate ([employer]) 08/29/2015  . Hemorrhoids 02/24/2014  . HTN (hypertension) 02/24/2014  . Hyperlipidemia, unspecified 02/24/2014  . Nephrolithiasis   . SOB (shortness of breath) on exertion 03/27/2015  . Statin intolerance 03/30/2015      Surgical History:  Past Surgical History:  Procedure Laterality Date  . CHOLECYSTECTOMY    .  TONSILLECTOMY       Home Meds: Prior to Admission medications   Medication Sig Start Date End Date Taking? Authorizing Provider  albuterol (PROVENTIL) (2.5 MG/3ML) 0.083% nebulizer solution Take 2.5 mg by nebulization every 6 (six) hours as needed for wheezing or shortness of breath.   Yes [provider]  apixaban (ELIQUIS) 2.5 MG TABS tablet Take 2.5 mg by mouth 2 (two) times daily.   Yes [provider]  calcium carbonate (TUMS - DOSED IN MG ELEMENTAL CALCIUM) 500 MG chewable tablet Chew 1 tablet by mouth daily as needed for indigestion or heartburn.   Yes [provider]  diltiazem (CARDIZEM CD) 240 MG 24 hr capsule Take 240 mg by mouth daily.   Yes [provider]  escitalopram (LEXAPRO) 5 MG tablet Take 5 mg by mouth daily. 03/18/19  Yes [provider]  ezetimibe (ZETIA) 10 MG tablet Take 10 mg by mouth daily.    Yes [provider]  fluticasone (FLONASE) 50 MCG/ACT nasal spray Place 2 sprays into both nostrils daily.   Yes [provider]  ipratropium (ATROVENT) 0.03 % nasal spray Place 2 sprays into both nostrils 3 (three) times daily as needed for rhinitis (runny nose).   Yes [provider]  metoprolol succinate (TOPROL-XL) 50 MG 24 hr tablet Take 50 mg by mouth daily.    Yes [provider]  mometasone (ELOCON) 0.1 % lotion Apply 4 drops topically at bedtime as needed (itchy dry skin  in the ears).   Yes [provider]  Multiple Vitamin (MULTIVITAMIN WITH MINERALS) TABS tablet Take 1 tablet by mouth daily.   Yes [provider]  nitroGLYCERIN (NITROSTAT) 0.4 MG SL tablet Place 0.4 mg under the tongue every 5 (five) minutes as needed for chest pain.   Yes [provider]  pantoprazole (PROTONIX) 40 MG tablet Take 1 tablet (40 mg total) by mouth daily. 04/01/18  Yes Schnier, Eric Lory, MD  psyllium (HYDROCIL/METAMUCIL) 95 % PACK Take 1 packet by mouth daily as needed for mild  constipation or moderate constipation.   Yes [provider]  zolpidem (AMBIEN) 10 MG tablet Take 10 mg by mouth at bedtime as needed for sleep.  10/15/16  Yes [provider]  clopidogrel (PLAVIX) 75 MG tablet Take 1 tablet (75 mg total) by mouth daily. Patient not taking: Reported on 03/25/2019 04/01/18   Schnier, Eric Lory, MD    Inpatient Medications:  . apixaban  2.5 mg Oral BID  . clopidogrel  75 mg Oral Daily  . diltiazem  240 mg Oral Daily  . escitalopram  5 mg Oral Daily  . ezetimibe  10 mg Oral Daily  . fluticasone  2 spray Each Nare Daily  . metoprolol succinate  50 mg Oral Daily  . pantoprazole  40 mg Oral Daily  . [START ON 03/27/2019] pneumococcal 23 valent vaccine  0.5 mL Intramuscular Tomorrow-1000     Allergies: No Known Allergies  Social History   Socioeconomic History  . Marital status: Married    Spouse name: Not on file  . Number of children: Not on file  . Years of education: Not on file  . Highest education level: Not on file  Occupational History  . Not on file  Social Needs  . Financial resource strain: Not on file  . Food insecurity    Worry: Not on file    Inability: Not on file  . Transportation needs    Medical: Not on file    Non-medical: Not on file  Tobacco Use  . Smoking status: Former Research scientist (life sciences)  . Smokeless tobacco: Never Used  Substance and Sexual Activity  . Alcohol use: No  . Drug use: No  . Sexual activity: Not on file  Lifestyle  . Physical activity    Days per week: Not on file    Minutes per session: Not on file  . Stress: Not on file  Relationships  . Social Herbalist on phone: Not on file    Gets together: Not on file    Attends religious service: Not on file    Active member of club or organization: Not on file    Attends meetings of clubs or organizations: Not on file    Relationship status: Not on file  . Intimate partner violence    Fear of current or ex partner: Not on file    Emotionally  abused: Not on file    Physically abused: Not on file    Forced sexual activity: Not on file  Other Topics Concern  . Not on file  Social History Narrative  . Not on file     Family History  Problem Relation Age of Onset  . Bladder Cancer Neg Hx   . Prolactinoma Neg Hx   . Prostate cancer Neg Hx   . Kidney cancer Neg Hx      Review of Systems: A 12-system review of systems was performed and is negative except as  noted in the HPI.  Labs: No results for input(s): CKTOTAL, CKMB, TROPONINI in the last 72 hours. Lab Results  Component Value Date   WBC 10.5 03/25/2019   HGB 14.9 03/25/2019   HCT 45.2 03/25/2019   MCV 91.9 03/25/2019   PLT 188 03/25/2019    Recent Labs  Lab 03/25/19 1917  NA 139  K 4.0  CL 106  CO2 21*  BUN 16  CREATININE 1.02  CALCIUM 9.0  PROT 6.5  BILITOT 1.0  ALKPHOS 76  ALT 14  AST 24  GLUCOSE 115*   Lab Results  Component Value Date   CHOL 131 03/26/2019   HDL 44 03/26/2019   LDLCALC 76 03/26/2019   TRIG 54 03/26/2019   No results found for: DDIMER  Radiology/Studies:  Dg Chest 2 View  Result Date: 03/25/2019 CLINICAL DATA:  Pain. EXAM: CHEST - 2 VIEW COMPARISON:  None. FINDINGS: The heart size and mediastinal contours are within normal limits. Both lungs are clear. The visualized skeletal structures are unremarkable. IMPRESSION: No active cardiopulmonary disease. Electronically Signed   By: Dorise Bullion III M.D   On: 03/25/2019 20:00    Wt Readings from Last 3 Encounters:  03/26/19 58.2 kg  10/04/18 58.2 kg  07/26/18 57.2 kg    EKG: Atrial flutter with left anterior fascicular block and right bundle branch block.  Physical Exam:  Blood pressure 128/74, pulse 64, temperature 98.2 F (36.8 C), temperature source Oral, resp. rate 18, height 5\' 6"  (1.676 m), weight 58.2 kg, SpO2 93 %. Body mass index is 20.71 kg/m. General: Well developed, well nourished, in no acute distress. Head: Normocephalic, atraumatic, sclera  non-icteric, no xanthomas, nares are without discharge.  Hard of hearing  Neck: Right carotid bruit.  JVD not elevated. Lungs: Clear bilaterally to auscultation without wheezes but with bilateral rhonchi. Heart: Irregular rate and rhythm.  1/6 to 2/6 systolic murmur. Abdomen: Soft, non-tender, non-distended with normoactive bowel sounds. No hepatomegaly. No rebound/guarding. No obvious abdominal masses. Msk:  Strength and tone appear normal for age. Extremities: No clubbing or cyanosis. No edema.  Distal pedal pulses are 2+ and equal bilaterally. Neuro: Alert and oriented X 3. No facial asymmetry. No focal deficit. Moves all extremities spontaneously. Psych:  Responds to questions appropriately with a normal affect.     Assessment and Plan  84 year old male with history of moderate coronary disease diagnosed in 2008 treated medically with metoprolol and Plavix.  He is not on aspirin due to also being on apixaban for chronic atrial flutter.  He was admitted with atypical chest pain.  He is ruled out for a myocardial infarction with negative high  sensitivity troponin x2.  He has had no chest pain since admission.  EKG is unchanged from baseline.  Chest x-ray revealed no heart failure.  He has been compliant with his medications.  Would recommend continuing current outpatient medications and ambulate this morning.  If stable consider discharge on current regimen with prompt outpatient work-up and Dr. Kristeen Miss office for further re-stratification.  Signed, Teodoro Spray MD 03/26/2019, 7:54 AM Pager: [phone]

## 2019-03-26 NOTE — Progress Notes (Signed)
Advanced care plan.  Purpose of the Encounter: CODE STATUS  Parties in Attendance: Patient himself  Patient's Decision Capacity: Intact  Subjective/Patient's story: Eric Li  is a 84 y.o. male with a known history of CAD, carotid artery disease, CHF, COPD, hypertension, and hyperlipidemia.   He presented to the emergency room via EMS services from home with complaint of midsternal chest pain which has been intermittent over the last 2 days.  Patient states that he has been having a lot of reflux symptoms.  He was placed under observation for further evaluation.  His cardiac enzymes remain negative.  He was seen by cardiology who recommended outpatient follow-up with his primary cardiologist.  His echocardiogram showed no significant wall motion abnormality.   Objective/Medical story I discussed with the patient regarding his desires for cardiac and pulmonary resuscitation.  And living will and healthcare power of attorney.   Goals of care determination:   Patient states that his daughter makes his healthcare power of attorney decisions and has a living will in place.  He still would like to be a full code  CODE STATUS:  Full code  Time spent discussing advanced care planning: 16 minutes

## 2019-03-26 NOTE — Discharge Summary (Signed)
[place] [facility], 84 y.o., DOB 06-24-1935, MRN [id number]. Admission date: 03/25/2019 Discharge Date 03/26/2019 Primary MD [name], MD Admitting Physician Christel Mormon, MD  Admission Diagnosis  Atypical chest pain [R07.89]  Discharge Diagnosis    Chest pain noncardiac possible GI related Chronic diastolic CHF COPD without exacerbation GERD Hyperlipidemia   Hospital Course Eric Li  is a 84 y.o. male with a known history of CAD, carotid artery disease, CHF, COPD, hypertension, and hyperlipidemia.   He presented to the emergency room via EMS services from home with complaint of midsternal chest pain which has been intermittent over the last 2 days.  Patient stated that his symptoms were worst with eating.  And he had a lot of reflux.  Patient was admitted and had a EKG cardiac enzymes which were nonrevealing.  He was seen in consultation by cardiology had a echo which shows a normal EF.  He has been ambulated without any symptoms.  Patient is stable to be discharged with outpatient follow-up.            Consults  cardiology  Significant Tests:  See full reports for all details     Dg Chest 2 View  Result Date: 03/25/2019 CLINICAL DATA:  Pain. EXAM: CHEST - 2 VIEW COMPARISON:  None. FINDINGS: The heart size and mediastinal contours are within normal limits. Both lungs are clear. The visualized skeletal structures are unremarkable. IMPRESSION: No active cardiopulmonary disease. Electronically Signed   By: Dorise Bullion III M.D   On: 03/25/2019 20:00       Today   Subjective:   Eric Li patient doing well denies any chest pains  Objective:   Blood pressure 128/74, pulse 64, temperature 98.2 F (36.8 C), temperature source Oral, resp. rate 18, height 5\' 6"  (1.676 m), weight 58.2 kg, SpO2 93 %.  .  Intake/Output Summary (Last 24 hours) at 03/26/2019 1149 Last data filed at 03/26/2019 0610 Gross per 24 hour   Intake -  Output 200 ml  Net -200 ml    Exam VITAL SIGNS: Blood pressure 128/74, pulse 64, temperature 98.2 F (36.8 C), temperature source Oral, resp. rate 18, height 5\' 6"  (1.676 m), weight 58.2 kg, SpO2 93 %.  GENERAL:  84 y.o.-year-old patient lying in the bed with no acute distress.  EYES: Pupils equal, round, reactive to light and accommodation. No scleral icterus. Extraocular muscles intact.  HEENT: Head atraumatic, normocephalic. Oropharynx and nasopharynx clear.  NECK:  Supple, no jugular venous distention. No thyroid enlargement, no tenderness.  LUNGS: Normal breath sounds bilaterally, no wheezing, rales,rhonchi or crepitation. No use of accessory muscles of respiration.  CARDIOVASCULAR: S1, S2 normal. No murmurs, rubs, or gallops.  ABDOMEN: Soft, nontender, nondistended. Bowel sounds present. No organomegaly or mass.  EXTREMITIES: No pedal edema, cyanosis, or clubbing.  NEUROLOGIC: Cranial nerves II through XII are intact. Muscle strength 5/5 in all extremities. Sensation intact. Gait not checked.  PSYCHIATRIC: The patient is alert and oriented x 3.  SKIN: No obvious rash, lesion, or ulcer.   Data Review     CBC w Diff:  Lab Results  Component Value Date   WBC 10.5 03/25/2019   HGB 14.9 03/25/2019   HCT 45.2 03/25/2019   PLT 188 03/25/2019   LYMPHOPCT 24 03/25/2019   MONOPCT 12 03/25/2019   EOSPCT 2 03/25/2019   BASOPCT 1 03/25/2019   CMP:  Lab Results  Component Value Date   NA 139 03/25/2019  K 4.0 03/25/2019   CL 106 03/25/2019   CO2 21 (L) 03/25/2019   BUN 16 03/25/2019   CREATININE 1.02 03/25/2019   PROT 6.5 03/25/2019   ALBUMIN 3.7 03/25/2019   BILITOT 1.0 03/25/2019   ALKPHOS 76 03/25/2019   AST 24 03/25/2019   ALT 14 03/25/2019  .  Micro Results Recent Results (from the past 240 hour(s))  SARS Coronavirus 2 (CEPHEID - Performed in [facility] hospital lab), Hosp Order     Status: None   Collection Time: 03/25/19  7:34 PM   Specimen:  Nasopharyngeal Swab  Result Value Ref Range Status   SARS Coronavirus 2 NEGATIVE NEGATIVE Final    Comment: (NOTE) If result is NEGATIVE SARS-CoV-2 target nucleic acids are NOT DETECTED. The SARS-CoV-2 RNA is generally detectable in upper and lower  respiratory specimens during the acute phase of infection. The lowest  concentration of SARS-CoV-2 viral copies this assay can detect is 250  copies / mL. A negative result does not preclude SARS-CoV-2 infection  and should not be used as the sole basis for treatment or other  patient management decisions.  A negative result may occur with  improper specimen collection / handling, submission of specimen other  than nasopharyngeal swab, presence of viral mutation(s) within the  areas targeted by this assay, and inadequate number of viral copies  (<250 copies / mL). A negative result must be combined with clinical  observations, patient history, and epidemiological information. If result is POSITIVE SARS-CoV-2 target nucleic acids are DETECTED. The SARS-CoV-2 RNA is generally detectable in upper and lower  respiratory specimens dur ing the acute phase of infection.  Positive  results are indicative of active infection with SARS-CoV-2.  Clinical  correlation with patient history and other diagnostic information is  necessary to determine patient infection status.  Positive results do  not rule out bacterial infection or co-infection with other viruses. If result is PRESUMPTIVE POSTIVE SARS-CoV-2 nucleic acids MAY BE PRESENT.   A presumptive positive result was obtained on the submitted specimen  and confirmed on repeat testing.  While 2019 novel coronavirus  (SARS-CoV-2) nucleic acids may be present in the submitted sample  additional confirmatory testing may be necessary for epidemiological  and / or clinical management purposes  to differentiate between  SARS-CoV-2 and other Sarbecovirus currently known to infect humans.  If clinically  indicated additional testing with an alternate test  methodology [phone]) is advised. The SARS-CoV-2 RNA is generally  detectable in upper and lower respiratory sp ecimens during the acute  phase of infection. The expected result is Negative. Fact Sheet for Patients:  StrictlyIdeas.no Fact Sheet for Healthcare Providers: BankingDealers.co.za This test is not yet approved or cleared by the [place] FDA and has been authorized for detection and/or diagnosis of SARS-CoV-2 by FDA under an Emergency Use Authorization (EUA).  This EUA will remain in effect (meaning this test can be used) for the duration of the COVID-19 declaration under Section 564(b)(1) of the Act, 21 U.S.C. section 360bbb-3(b)(1), unless the authorization is terminated or revoked sooner. Performed at [facility], [address], [place], [place] [address]         Code Status Orders  (From admission, onward)         Start     Ordered   03/25/19 2343  Full code  Continuous     03/25/19 2342        Code Status History    Date Active Date Inactive Code Status  Order ID Comments User Context   03/15/2018 2049 03/17/2018 1447 Full Code [id number]  Nicholes Mango, MD Inpatient   Advance Care Planning Activity          Follow-up Information    [name], MD Follow up in 1 week(s).   Specialty: Internal Medicine Contact information: [address] [place] [address] [phone]        Isaias Cowman, MD Follow up in 1 week(s).   Specialty: Cardiology Why: hosp f/u Contact information: [facility] [place] [address] [phone]           Discharge Medications   Allergies as of 03/26/2019   No Known Allergies     Medication List    TAKE these medications   albuterol (2.5 MG/3ML) 0.083% nebulizer solution Commonly known as: PROVENTIL Take  2.5 mg by nebulization every 6 (six) hours as needed for wheezing or shortness of breath.   calcium carbonate 500 MG chewable tablet Commonly known as: TUMS - dosed in mg elemental calcium Chew 1 tablet by mouth daily as needed for indigestion or heartburn.   clopidogrel 75 MG tablet Commonly known as: PLAVIX Take 1 tablet (75 mg total) by mouth daily.   diltiazem 240 MG 24 hr capsule Commonly known as: CARDIZEM CD Take 240 mg by mouth daily.   Eliquis 2.5 MG Tabs tablet Generic drug: apixaban Take 2.5 mg by mouth 2 (two) times daily.   escitalopram 5 MG tablet Commonly known as: LEXAPRO Take 5 mg by mouth daily.   ezetimibe 10 MG tablet Commonly known as: ZETIA Take 10 mg by mouth daily.   fluticasone 50 MCG/ACT nasal spray Commonly known as: FLONASE Place 2 sprays into both nostrils daily.   ipratropium 0.03 % nasal spray Commonly known as: ATROVENT Place 2 sprays into both nostrils 3 (three) times daily as needed for rhinitis (runny nose).   metoprolol succinate 50 MG 24 hr tablet Commonly known as: TOPROL-XL Take 50 mg by mouth daily.   mometasone 0.1 % lotion Commonly known as: ELOCON Apply 4 drops topically at bedtime as needed (itchy dry skin in the ears).   multivitamin with minerals Tabs tablet Take 1 tablet by mouth daily.   nitroGLYCERIN 0.4 MG SL tablet Commonly known as: NITROSTAT Place 0.4 mg under the tongue every 5 (five) minutes as needed for chest pain.   pantoprazole 40 MG tablet Commonly known as: Protonix Take 1 tablet (40 mg total) by mouth 2 (two) times daily. What changed: when to take this   psyllium 95 % Pack Commonly known as: HYDROCIL/METAMUCIL Take 1 packet by mouth daily as needed for mild constipation or moderate constipation.   zolpidem 10 MG tablet Commonly known as: AMBIEN Take 10 mg by mouth at bedtime as needed for sleep.          Total Time in preparing paper work, data evaluation and todays exam - 1  minutes  Dustin Flock M.D on 03/26/2019 at 11:49 AM [place]  [phone]

## 2019-03-26 NOTE — Progress Notes (Signed)
*  PRELIMINARY RESULTS* Echocardiogram 2D Echocardiogram has been performed.  Eric Li 03/26/2019, 9:50 AM

## 2019-03-26 NOTE — Progress Notes (Signed)
Patient wheeled to DC via [place] in NAD. Pt verbalized understanding of all instructions; was given written instructions.

## 2019-03-26 NOTE — H&P (Signed)
[facility] [place] NAME: Eric Li    MR#:  [id number]  DATE OF BIRTH:  [birth year]  DATE OF ADMISSION:  03/25/2019  PRIMARY CARE PHYSICIAN: [name], MD   REQUESTING/REFERRING PHYSICIAN: Duffy Bruce, MD  CHIEF COMPLAINT:   Chief Complaint  Patient presents with  . Chest Pain    HISTORY OF PRESENT ILLNESS:  Eric Li  is a 84 y.o. male with a known history of CAD, carotid artery disease, CHF, COPD, hypertension, and hyperlipidemia.   He presented to the emergency room via EMS services from home with complaint of midsternal chest pain which has been intermittent over the last 2 days.  Pain usually occurs at rest with no exacerbating factors noted.  Initially, patient had thought he was experiencing worsening heartburn.  However discomfort was not correlated with intake of food and not relieved by antacids.  Pain has become more severe over the last 12 hours described as pressure usually lasting less than 5 minutes with a pain score 6-10 out of 10.  Pain is relieved with nitroglycerin.  Associated symptoms include mild shortness of breath increased however patient reports having a history of COPD and attributes most of his shortness of breath to this.  He has also noted some lightheadedness associated with the chest pain.  He has noted no nausea or vomiting.  No abdominal pain.  He denies recent fevers or chills.  He has had no recent medication changes.  He follows up outpatient with Dr. Saralyn Pilar.  Recent echocardiogram of record is June 2019 with a 55 to 60% ejection fraction.  On arrival to the emergency room, troponin is negative.  No significant EKG changes noted.  Rapid COVID-19 testing is negative.  Chest x-ray demonstrated no acute pulmonary disease.  Patient was treated with aspirin in the emergency room.  He has now been admitted to the hospital service for further management.   PAST MEDICAL HISTORY:   Past Medical  History:  Diagnosis Date  . Bilateral hearing loss 03/03/2016  . CAD (coronary artery disease) 02/24/2014  . CHF (congestive heart failure) ([place])   . COPD, moderate ([place]) 08/29/2015  . Hemorrhoids 02/24/2014  . HTN (hypertension) 02/24/2014  . Hyperlipidemia, unspecified 02/24/2014  . Nephrolithiasis   . SOB (shortness of breath) on exertion 03/27/2015  . Statin intolerance 03/30/2015    PAST SURGICAL HISTORY:   Past Surgical History:  Procedure Laterality Date  . CHOLECYSTECTOMY    . TONSILLECTOMY      SOCIAL HISTORY:   Social History   Tobacco Use  . Smoking status: Former Research scientist (life sciences)  . Smokeless tobacco: Never Used  Substance Use Topics  . Alcohol use: No    FAMILY HISTORY:   Family History  Problem Relation Age of Onset  . Bladder Cancer Neg Hx   . Prolactinoma Neg Hx   . Prostate cancer Neg Hx   . Kidney cancer Neg Hx     DRUG ALLERGIES:  No Known Allergies  REVIEW OF SYSTEMS:   Review of Systems  Constitutional: Negative for chills, fever and malaise/fatigue.  HENT: Negative for congestion and sore throat.   Eyes: Negative for blurred vision, double vision and pain.  Respiratory: Positive for shortness of breath (No worse than usual with a history of COPD). Negative for cough and wheezing.   Cardiovascular: Positive for chest pain (Midsternal pressure intermittent), palpitations and leg swelling (Mild bilateral lower extremity edema).  Gastrointestinal: Positive for heartburn. Negative for abdominal pain,  constipation, diarrhea, nausea and vomiting.  Genitourinary: Negative for dysuria, flank pain, frequency and hematuria.  Musculoskeletal: Negative for falls and myalgias.  Skin: Negative for itching and rash.  Neurological: Negative for dizziness, weakness and headaches.  Psychiatric/Behavioral: Negative.  Negative for depression.     MEDICATIONS AT HOME:   Prior to Admission medications   Medication Sig Start Date End Date Taking? Authorizing Provider   albuterol (PROVENTIL) (2.5 MG/3ML) 0.083% nebulizer solution Take 2.5 mg by nebulization every 6 (six) hours as needed for wheezing or shortness of breath.   Yes [provider]  apixaban (ELIQUIS) 2.5 MG TABS tablet Take 2.5 mg by mouth 2 (two) times daily.   Yes [provider]  calcium carbonate (TUMS - DOSED IN MG ELEMENTAL CALCIUM) 500 MG chewable tablet Chew 1 tablet by mouth daily as needed for indigestion or heartburn.   Yes [provider]  diltiazem (CARDIZEM CD) 240 MG 24 hr capsule Take 240 mg by mouth daily.   Yes [provider]  escitalopram (LEXAPRO) 5 MG tablet Take 5 mg by mouth daily. 03/18/19  Yes [provider]  ezetimibe (ZETIA) 10 MG tablet Take 10 mg by mouth daily.    Yes [provider]  fluticasone (FLONASE) 50 MCG/ACT nasal spray Place 2 sprays into both nostrils daily.   Yes [provider]  ipratropium (ATROVENT) 0.03 % nasal spray Place 2 sprays into both nostrils 3 (three) times daily as needed for rhinitis (runny nose).   Yes [provider]  metoprolol succinate (TOPROL-XL) 50 MG 24 hr tablet Take 50 mg by mouth daily.    Yes [provider]  mometasone (ELOCON) 0.1 % lotion Apply 4 drops topically at bedtime as needed (itchy dry skin in the ears).   Yes [provider]  Multiple Vitamin (MULTIVITAMIN WITH MINERALS) TABS tablet Take 1 tablet by mouth daily.   Yes [provider]  nitroGLYCERIN (NITROSTAT) 0.4 MG SL tablet Place 0.4 mg under the tongue every 5 (five) minutes as needed for chest pain.   Yes [provider]  pantoprazole (PROTONIX) 40 MG tablet Take 1 tablet (40 mg total) by mouth daily. 04/01/18  Yes Schnier, Dolores Lory, MD  psyllium (HYDROCIL/METAMUCIL) 95 % PACK Take 1 packet by mouth daily as needed for mild constipation or moderate constipation.   Yes [provider]  zolpidem (AMBIEN) 10 MG tablet Take 10 mg by mouth at bedtime as  needed for sleep.  10/15/16  Yes [provider]  clopidogrel (PLAVIX) 75 MG tablet Take 1 tablet (75 mg total) by mouth daily. Patient not taking: Reported on 03/25/2019 04/01/18   Schnier, Dolores Lory, MD      VITAL SIGNS:  Blood pressure 103/73, pulse (!) 51, temperature 98.3 F (36.8 C), temperature source Oral, resp. rate 17, height 5' (1.524 m), weight 59.9 kg, SpO2 97 %.  PHYSICAL EXAMINATION:  Physical Exam  GENERAL: Non-ill-appearing 84 y.o.-year-old patient lying in the bed with no acute distress.  EYES: Pupils equal, round, reactive to light and accommodation. No scleral icterus. Extraocular muscles intact.  HEENT: Head atraumatic, normocephalic. Oropharynx and nasopharynx clear.  NECK:  Supple, no jugular venous distention. No thyroid enlargement, no tenderness.  LUNGS: Faint bilateral expiratory wheezing: No rales,Norhonchi or crepitation. No use of accessory muscles of respiration.  CARDIOVASCULAR: Regular rate and rhythm, S1, S2 normal. No murmurs, rubs, or gallops.  ABDOMEN: Soft, nondistended, nontender. Bowel sounds present. No organomegaly or mass.  EXTREMITIES:Trace bilateral pedal edema, cyanosis,  or clubbing.  NEUROLOGIC: Decreased hearing acuity. Muscle strength 5/5 in all extremities. Sensation intact. Gait not checked.  PSYCHIATRIC: The patient is alert and oriented x 3.  Normal affect and good eye contact. SKIN: No obvious rash, lesion, or ulcer.   LABORATORY PANEL:   CBC Recent Labs  Lab 03/25/19 1917  WBC 10.5  HGB 14.9  HCT 45.2  PLT 188   ------------------------------------------------------------------------------------------------------------------  Chemistries  Recent Labs  Lab 03/25/19 1917  NA 139  K 4.0  CL 106  CO2 21*  GLUCOSE 115*  BUN 16  CREATININE 1.02  CALCIUM 9.0  AST 24  ALT 14  ALKPHOS 76  BILITOT 1.0    ------------------------------------------------------------------------------------------------------------------  Cardiac Enzymes No results for input(s): TROPONINI in the last 168 hours. ------------------------------------------------------------------------------------------------------------------  RADIOLOGY:  Dg Chest 2 View  Result Date: 03/25/2019 CLINICAL DATA:  Pain. EXAM: CHEST - 2 VIEW COMPARISON:  None. FINDINGS: The heart size and mediastinal contours are within normal limits. Both lungs are clear. The visualized skeletal structures are unremarkable. IMPRESSION: No active cardiopulmonary disease. Electronically Signed   By: Dorise Bullion III M.D   On: 03/25/2019 20:00      IMPRESSION AND PLAN:   1.  Atypical chest pain - Continue to trend troponin levels - Echocardiogram - Lipid panel - Patient is on telemetry monitoring -Dr. Fath/cardiology has been consulted for further evaluation and recommendations given his history of coronary artery disease and CHF. - Plavix and Eliquis have been continued - Beta-blocker continued  2.  CHF - BNP is pending -No evidence of edema noted on chest x-ray -Continue beta-blocker therapy  3.  GERD - PPI therapy continued  4.  COPD - O2 per nasal cannula as needed -DuoNeb every 6 hours as needed -No evidence of exacerbation  5.  Hyperlipidemia - Lipid panel is pending -Zetia continued  DVT and PPI prophylaxis initiated    All the records are reviewed and case discussed with ED provider. The plan of care was discussed in details with the patient (and family). I answered all questions. The patient agreed to proceed with the above mentioned plan. Further management will depend upon hospital course.   CODE STATUS: Full code  TOTAL TIME TAKING CARE OF THIS PATIENT: 63minutes.    [place] on 03/26/2019 at 12:17 AM  Pager - [phone]  After 6pm go to [email or web address] - Proofreader  [employer]  [facility]  Office  [phone]  CC: Primary care physician; [name], MD   Note: This dictation was prepared with Dragon dictation along with smaller phrase technology. Any transcriptional errors that result from this process are unintentional.

## 2019-04-04 DIAGNOSIS — J449 Chronic obstructive pulmonary disease, unspecified: Secondary | ICD-10-CM | POA: Diagnosis not present

## 2019-04-28 DIAGNOSIS — J449 Chronic obstructive pulmonary disease, unspecified: Secondary | ICD-10-CM | POA: Diagnosis not present

## 2019-04-28 DIAGNOSIS — R0602 Shortness of breath: Secondary | ICD-10-CM | POA: Diagnosis not present

## 2019-04-28 DIAGNOSIS — R05 Cough: Secondary | ICD-10-CM | POA: Diagnosis not present

## 2019-05-05 DIAGNOSIS — J449 Chronic obstructive pulmonary disease, unspecified: Secondary | ICD-10-CM | POA: Diagnosis not present

## 2019-06-05 DIAGNOSIS — J449 Chronic obstructive pulmonary disease, unspecified: Secondary | ICD-10-CM | POA: Diagnosis not present

## 2019-06-08 DIAGNOSIS — J449 Chronic obstructive pulmonary disease, unspecified: Secondary | ICD-10-CM | POA: Diagnosis not present

## 2019-07-05 DIAGNOSIS — J449 Chronic obstructive pulmonary disease, unspecified: Secondary | ICD-10-CM | POA: Diagnosis not present

## 2019-07-08 DIAGNOSIS — J449 Chronic obstructive pulmonary disease, unspecified: Secondary | ICD-10-CM | POA: Diagnosis not present

## 2019-07-12 DIAGNOSIS — J449 Chronic obstructive pulmonary disease, unspecified: Secondary | ICD-10-CM | POA: Diagnosis not present

## 2019-08-05 DIAGNOSIS — J449 Chronic obstructive pulmonary disease, unspecified: Secondary | ICD-10-CM | POA: Diagnosis not present

## 2019-09-01 DIAGNOSIS — J449 Chronic obstructive pulmonary disease, unspecified: Secondary | ICD-10-CM | POA: Diagnosis not present

## 2019-09-04 DIAGNOSIS — J449 Chronic obstructive pulmonary disease, unspecified: Secondary | ICD-10-CM | POA: Diagnosis not present

## 2019-09-26 DIAGNOSIS — R0602 Shortness of breath: Secondary | ICD-10-CM | POA: Diagnosis not present

## 2019-09-26 DIAGNOSIS — R0902 Hypoxemia: Secondary | ICD-10-CM | POA: Diagnosis not present

## 2019-09-26 DIAGNOSIS — J449 Chronic obstructive pulmonary disease, unspecified: Secondary | ICD-10-CM | POA: Diagnosis not present

## 2019-09-27 DIAGNOSIS — F411 Generalized anxiety disorder: Secondary | ICD-10-CM | POA: Diagnosis not present

## 2019-09-27 DIAGNOSIS — H918X3 Other specified hearing loss, bilateral: Secondary | ICD-10-CM | POA: Diagnosis not present

## 2019-09-27 DIAGNOSIS — I10 Essential (primary) hypertension: Secondary | ICD-10-CM | POA: Diagnosis not present

## 2019-09-27 DIAGNOSIS — I2510 Atherosclerotic heart disease of native coronary artery without angina pectoris: Secondary | ICD-10-CM | POA: Diagnosis not present

## 2019-09-27 DIAGNOSIS — I739 Peripheral vascular disease, unspecified: Secondary | ICD-10-CM | POA: Diagnosis not present

## 2019-09-27 DIAGNOSIS — J449 Chronic obstructive pulmonary disease, unspecified: Secondary | ICD-10-CM | POA: Diagnosis not present

## 2019-09-27 DIAGNOSIS — F5104 Psychophysiologic insomnia: Secondary | ICD-10-CM | POA: Diagnosis not present

## 2019-09-27 DIAGNOSIS — R413 Other amnesia: Secondary | ICD-10-CM | POA: Diagnosis not present

## 2019-09-27 DIAGNOSIS — R972 Elevated prostate specific antigen [PSA]: Secondary | ICD-10-CM | POA: Diagnosis not present

## 2019-10-05 DIAGNOSIS — J449 Chronic obstructive pulmonary disease, unspecified: Secondary | ICD-10-CM | POA: Diagnosis not present

## 2019-10-06 ENCOUNTER — Ambulatory Visit (INDEPENDENT_AMBULATORY_CARE_PROVIDER_SITE_OTHER): Payer: PPO | Admitting: Vascular Surgery

## 2019-10-06 ENCOUNTER — Ambulatory Visit (INDEPENDENT_AMBULATORY_CARE_PROVIDER_SITE_OTHER): Payer: PPO

## 2019-10-06 ENCOUNTER — Encounter (INDEPENDENT_AMBULATORY_CARE_PROVIDER_SITE_OTHER): Payer: Self-pay | Admitting: Vascular Surgery

## 2019-10-06 ENCOUNTER — Other Ambulatory Visit: Payer: Self-pay

## 2019-10-06 ENCOUNTER — Encounter (INDEPENDENT_AMBULATORY_CARE_PROVIDER_SITE_OTHER): Payer: Self-pay

## 2019-10-06 VITALS — BP 116/71 | HR 69 | Resp 16 | Ht 66.0 in | Wt 124.0 lb

## 2019-10-06 DIAGNOSIS — I739 Peripheral vascular disease, unspecified: Secondary | ICD-10-CM

## 2019-10-06 DIAGNOSIS — I6523 Occlusion and stenosis of bilateral carotid arteries: Secondary | ICD-10-CM

## 2019-10-06 DIAGNOSIS — I4892 Unspecified atrial flutter: Secondary | ICD-10-CM

## 2019-10-06 DIAGNOSIS — I25118 Atherosclerotic heart disease of native coronary artery with other forms of angina pectoris: Secondary | ICD-10-CM

## 2019-10-06 DIAGNOSIS — I10 Essential (primary) hypertension: Secondary | ICD-10-CM

## 2019-10-10 ENCOUNTER — Encounter (INDEPENDENT_AMBULATORY_CARE_PROVIDER_SITE_OTHER): Payer: Self-pay | Admitting: Vascular Surgery

## 2019-10-10 NOTE — Progress Notes (Signed)
MRN : [id number]  Eric Li is a 85 y.o. (20-Nov-1934) male who presents with chief complaint of  Chief Complaint  Patient presents with  . Follow-up    U/S  Follow up  .  History of Present Illness:   The patient is seen for follow up evaluation of carotid stenosis. The carotid stenosiswas identified after admission to [facility] for syncope. CTA showed 50-60% RICA and occlusion of the LICA. He continue to have intermittent episodes of dizziness  The patient denies amaurosis fugax. There is no recent history of TIA symptoms or focal motor deficits. There is no prior documented CVA.  The patient is taking enteric-coated aspirin 81 mg dailyplus plavix 75 mg po daily.  There is no history of migraine headaches. There is no history of seizures.  The patient is also followed for ASO with claudication which he reports is unchanged. There have been no interval changes in lower extremity symptoms. No interval shortening of the patient's claudication distance or development of rest pain symptoms. No new ulcers or wounds have occurred since the last visit.  The patient denies history of DVT, PE or superficial thrombophlebitis. The patient denies recent episodes of angina There is a history of hyperlipidemia which is being treated with a statin.  ABI Rt=1.20 and Lt=1.11 (ABI Rt=1.00 and Lt=1.05)  Duplex ultrasound of the of the carotid arteries shows RICA 123456 and LICA known occlusion (no change compared to last years study)  Current Meds  Medication Sig  . ADVAIR DISKUS 250-50 MCG/DOSE AEPB   . albuterol (PROVENTIL) (2.5 MG/3ML) 0.083% nebulizer solution Take 2.5 mg by nebulization every 6 (six) hours as needed for wheezing or shortness of breath.  Marland Kitchen albuterol (PROVENTIL) (2.5 MG/3ML) 0.083% nebulizer solution USE 1 VIAL IN NEBULIZER 4 TIMES DAILY  . apixaban (ELIQUIS) 2.5 MG TABS tablet Take 2.5 mg by mouth 2 (two) times daily.  . calcium carbonate (TUMS - DOSED IN MG  ELEMENTAL CALCIUM) 500 MG chewable tablet Chew 1 tablet by mouth daily as needed for indigestion or heartburn.  . clopidogrel (PLAVIX) 75 MG tablet Take 1 tablet (75 mg total) by mouth daily.  Marland Kitchen diltiazem (CARDIZEM CD) 240 MG 24 hr capsule Take 240 mg by mouth daily.  Marland Kitchen diltiazem (TIAZAC) 240 MG 24 hr capsule Take by mouth.  . escitalopram (LEXAPRO) 5 MG tablet Take 5 mg by mouth daily.  Marland Kitchen escitalopram (LEXAPRO) 5 MG tablet Take by mouth.  . ezetimibe (ZETIA) 10 MG tablet Take 10 mg by mouth daily.   Marland Kitchen FLUAD QUADRIVALENT 0.5 ML injection   . fluticasone (FLONASE) 50 MCG/ACT nasal spray Place 2 sprays into both nostrils daily.  . Fluticasone-Salmeterol (ADVAIR DISKUS) 250-50 MCG/DOSE AEPB INHALE 1 INHALATION INTO THE LUNGS EVERY 12 (TWELVE) HOURS  . ipratropium (ATROVENT) 0.03 % nasal spray Place 2 sprays into both nostrils 3 (three) times daily as needed for rhinitis (runny nose).  . metoprolol succinate (TOPROL-XL) 50 MG 24 hr tablet Take 50 mg by mouth daily.   . mirtazapine (REMERON) 7.5 MG tablet Take by mouth.  . mometasone (ELOCON) 0.1 % lotion Apply 4 drops topically at bedtime as needed (itchy dry skin in the ears).  . Multiple Vitamin (MULTIVITAMIN WITH MINERALS) TABS tablet Take 1 tablet by mouth daily.  . nitroGLYCERIN (NITROSTAT) 0.4 MG SL tablet Place 0.4 mg under the tongue every 5 (five) minutes as needed for chest pain.  . nitroGLYCERIN (NITROSTAT) 0.4 MG SL tablet Place under the tongue.  . pantoprazole (  PROTONIX) 40 MG tablet Take 1 tablet (40 mg total) by mouth 2 (two) times daily.  . pantoprazole (PROTONIX) 40 MG tablet Take by mouth.  . psyllium (HYDROCIL/METAMUCIL) 95 % PACK Take 1 packet by mouth daily as needed for mild constipation or moderate constipation.  Marland Kitchen tiZANidine (ZANAFLEX) 4 MG tablet   . tiZANidine (ZANAFLEX) 4 MG tablet Take by mouth.  . traZODone (DESYREL) 50 MG tablet   . zolpidem (AMBIEN) 10 MG tablet Take 10 mg by mouth at bedtime as needed for sleep.     Marland Kitchen zolpidem (AMBIEN) 5 MG tablet   . zolpidem (AMBIEN) 5 MG tablet TAKE 1 TABLET BY MOUTH EVERY DAY AT BEDTIME AS NEEDED FOR SLEEP    Past Medical History:  Diagnosis Date  . Bilateral hearing loss 03/03/2016  . CAD (coronary artery disease) 02/24/2014  . CHF (congestive heart failure) ([place])   . COPD, moderate ([place]) 08/29/2015  . Hemorrhoids 02/24/2014  . HTN (hypertension) 02/24/2014  . Hyperlipidemia, unspecified 02/24/2014  . Nephrolithiasis   . SOB (shortness of breath) on exertion 03/27/2015  . Statin intolerance 03/30/2015    Past Surgical History:  Procedure Laterality Date  . CHOLECYSTECTOMY    . TONSILLECTOMY      Social History Social History   Tobacco Use  . Smoking status: Former Research scientist (life sciences)  . Smokeless tobacco: Never Used  Substance Use Topics  . Alcohol use: No  . Drug use: No    Family History Family History  Problem Relation Age of Onset  . Bladder Cancer Neg Hx   . Prolactinoma Neg Hx   . Prostate cancer Neg Hx   . Kidney cancer Neg Hx     Allergies  Allergen Reactions  . Simvastatin      REVIEW OF SYSTEMS (Negative unless checked)  Constitutional: [] Weight loss  [] Fever  [] Chills Cardiac: [] Chest pain   [] Chest pressure   [] Palpitations   [] Shortness of breath when laying flat   [] Shortness of breath with exertion. Vascular:  [] Pain in legs with walking   [] Pain in legs at rest  [] History of DVT   [] Phlebitis   [] Swelling in legs   [] Varicose veins   [] Non-healing ulcers Pulmonary:   [] Uses home oxygen   [] Productive cough   [] Hemoptysis   [] Wheeze  [] COPD   [] Asthma Neurologic:  [] Dizziness   [] Seizures   [] History of stroke   [] History of TIA  [] Aphasia   [] Vissual changes   [] Weakness or numbness in arm   [] Weakness or numbness in leg Musculoskeletal:   [] Joint swelling   [] Joint pain   [] Low back pain Hematologic:  [] Easy bruising  [] Easy bleeding   [] Hypercoagulable state   [] Anemic Gastrointestinal:  [] Diarrhea   [] Vomiting  [] Gastroesophageal  reflux/heartburn   [] Difficulty swallowing. Genitourinary:  [] Chronic kidney disease   [] Difficult urination  [] Frequent urination   [] Blood in urine Skin:  [] Rashes   [] Ulcers  Psychological:  [] History of anxiety   []  History of major depression.  Physical Examination  Vitals:   10/06/19 1424  BP: 116/71  Pulse: 69  Resp: 16  Weight: 124 lb (56.2 kg)  Height: 5\' 6"  (1.676 m)   Body mass index is 20.01 kg/m. Gen: WD/WN, NAD Head: [place]/AT, No temporalis wasting.  Ear/Nose/Throat: Hearing grossly intact, nares w/o erythema or drainage Eyes: PER, EOMI, sclera nonicteric.  Neck: Supple, no large masses.   Pulmonary:  Good air movement, no audible wheezing bilaterally, no use of accessory muscles.  Cardiac: RRR, no JVD Vascular:  Carotid bruit bilaterally Vessel Right Left  Radial Palpable Palpable  Carotid Palpable Palpable  PT Palpable Palpable  DP Palpable Palpable  Gastrointestinal: Non-distended. No guarding/no peritoneal signs.  Musculoskeletal: M/S 5/5 throughout.  No deformity or atrophy.  Neurologic: CN 2-12 intact. Symmetrical.  Speech is fluent. Motor exam as listed above. Psychiatric: Judgment intact, Mood & affect appropriate for pt's clinical situation. Dermatologic: No rashes or ulcers noted.  No changes consistent with cellulitis. Lymph : No lichenification or skin changes of chronic lymphedema.  CBC Lab Results  Component Value Date   WBC 10.5 03/25/2019   HGB 14.9 03/25/2019   HCT 45.2 03/25/2019   MCV 91.9 03/25/2019   PLT 188 03/25/2019    BMET    Component Value Date/Time   NA 139 03/25/2019 1917   K 4.0 03/25/2019 1917   CL 106 03/25/2019 1917   CO2 21 (L) 03/25/2019 1917   GLUCOSE 115 (H) 03/25/2019 1917   BUN 16 03/25/2019 1917   CREATININE 1.02 03/25/2019 1917   CALCIUM 9.0 03/25/2019 1917   GFRNONAA >60 03/25/2019 1917   GFRAA >60 03/25/2019 1917   CrCl cannot be calculated (Patient's most recent lab result is older than the maximum 21  days allowed.).  COAG No results found for: INR, PROTIME  Radiology VAS [place] ABI WITH/WO TBI  Result Date: 10/06/2019 LOWER EXTREMITY DOPPLER STUDY Indications: Peripheral artery disease.  Comparison Study: 10/04/2018 Performing Technologist: Almira Coaster RVS  Examination Guidelines: A complete evaluation includes at minimum, Doppler waveform signals and systolic blood pressure reading at the level of bilateral brachial, anterior tibial, and posterior tibial arteries, when vessel segments are accessible. Bilateral testing is considered an integral part of a complete examination. Photoelectric Plethysmograph (PPG) waveforms and toe systolic pressure readings are included as required and additional duplex testing as needed. Limited examinations for reoccurring indications may be performed as noted.  ABI Findings: +---------+------------------+-----+---------+--------+ Right    Rt Pressure (mmHg)IndexWaveform Comment  +---------+------------------+-----+---------+--------+ Brachial 108                                      +---------+------------------+-----+---------+--------+ ATA      137               1.20 triphasic         +---------+------------------+-----+---------+--------+ PERO     123               1.08 triphasic         +---------+------------------+-----+---------+--------+ Great Toe102               0.89 Normal            +---------+------------------+-----+---------+--------+ +---------+------------------+-----+---------+-------+ Left     Lt Pressure (mmHg)IndexWaveform Comment +---------+------------------+-----+---------+-------+ Brachial 114                                     +---------+------------------+-----+---------+-------+ ATA      127               1.11 triphasic        +---------+------------------+-----+---------+-------+ PERO     122               1.07 triphasic        +---------+------------------+-----+---------+-------+ Great  Toe91                0.80 Normal           +---------+------------------+-----+---------+-------+ +-------+-----------+-----------+------------+------------+  ABI/TBIToday's ABIToday's TBIPrevious ABIPrevious TBI +-------+-----------+-----------+------------+------------+ Right  1.20       .89        1.11        .59          +-------+-----------+-----------+------------+------------+ Left   1.11       .80        1.05        .65          +-------+-----------+-----------+------------+------------+ Bilateral ABIs appear essentially unchanged compared to prior study on 10/04/2018. Compared to prior study on 10/04/2018.  Summary: Right: Resting right ankle-brachial index is within normal range. No evidence of significant right lower extremity arterial disease. The right toe-brachial index is normal. Left: Resting left ankle-brachial index is within normal range. No evidence of significant left lower extremity arterial disease. The left toe-brachial index is normal.  *See table(s) above for measurements and observations.  Electronically signed by Hortencia Pilar MD on 10/06/2019 at 5:19:46 PM.    Final    VAS US CAROTID  Result Date: 10/06/2019 Carotid Arterial Duplex Study Indications:       Carotid artery disease. Comparison Study:  10/04/2018, Today's Exam is Essentially unchanged from                    Previous. Performing Technologist: Almira Coaster RVS  Examination Guidelines: A complete evaluation includes B-mode imaging, spectral Doppler, color Doppler, and power Doppler as needed of all accessible portions of each vessel. Bilateral testing is considered an integral part of a complete examination. Limited examinations for reoccurring indications may be performed as noted.  Right Carotid Findings: +----------+--------+--------+--------+------------------+--------+           PSV cm/sEDV cm/sStenosisPlaque DescriptionComments  +----------+--------+--------+--------+------------------+--------+ CCA Prox  76      12                                         +----------+--------+--------+--------+------------------+--------+ CCA Mid   67      12                                         +----------+--------+--------+--------+------------------+--------+ CCA Distal67      14                                         +----------+--------+--------+--------+------------------+--------+ ICA Prox  178     30                                         +----------+--------+--------+--------+------------------+--------+ ICA Mid   129     22                                         +----------+--------+--------+--------+------------------+--------+ ICA Distal78      17                                         +----------+--------+--------+--------+------------------+--------+ ECA  174     0                                          +----------+--------+--------+--------+------------------+--------+ +----------+--------+-------+--------+-------------------+           PSV cm/sEDV cmsDescribeArm Pressure (mmHG) +----------+--------+-------+--------+-------------------+ [id number]      0                                  +----------+--------+-------+--------+-------------------+ +---------+--------+--+--------+-+ VertebralPSV cm/s60EDV cm/s9 +---------+--------+--+--------+-+  Left Carotid Findings: +----------+--------+--------+--------+------------------+--------+           PSV cm/sEDV cm/sStenosisPlaque DescriptionComments +----------+--------+--------+--------+------------------+--------+ CCA Prox  40      0                                          +----------+--------+--------+--------+------------------+--------+ CCA Mid   47      8                                          +----------+--------+--------+--------+------------------+--------+ CCA Distal45      0                                           +----------+--------+--------+--------+------------------+--------+ ICA Prox  0       0                                          +----------+--------+--------+--------+------------------+--------+ ICA Mid   0       0                                          +----------+--------+--------+--------+------------------+--------+ ICA Distal0       0                                          +----------+--------+--------+--------+------------------+--------+ ECA       302     36                                         +----------+--------+--------+--------+------------------+--------+ +----------+--------+--------+--------+-------------------+           PSV cm/sEDV cm/sDescribeArm Pressure (mmHG) +----------+--------+--------+--------+-------------------+ Subclavian142     0                                   +----------+--------+--------+--------+-------------------+ +---------+--------+--+--------+--+ VertebralPSV cm/s62EDV cm/s12 +---------+--------+--+--------+--+  Summary: Right Carotid: Velocities in the right ICA are consistent with a 40-59%                stenosis. Left Carotid: Evidence consistent with a total occlusion  of the left ICA. The               ECA appears >50% stenosed. Vertebrals:  Bilateral vertebral arteries demonstrate antegrade flow. Subclavians: Normal flow hemodynamics were seen in bilateral subclavian              arteries. *See table(s) above for measurements and observations.  Electronically signed by Hortencia Pilar MD on 10/06/2019 at 5:19:48 PM.    Final       Assessment/Plan 1. PAD (peripheral artery disease) (HCC)  Recommend:  The patient has evidence of atherosclerosis of the lower extremities with claudication.  The patient does not voice lifestyle limiting changes at this point in time.  Noninvasive studies do not suggest clinically significant change.  No invasive studies, angiography or  surgery at this time The patient should continue walking and begin a more formal exercise program.  The patient should continue antiplatelet therapy and aggressive treatment of the lipid abnormalities  No changes in the patient's medications at this time  The patient should continue wearing graduated compression socks 10-15 mmHg strength to control the mild edema.    2. Bilateral carotid artery stenosis Recommend:  Given the patient's asymptomatic subcritical stenosis no further invasive testing or surgery at this time.  Duplex ultrasound of the of the carotid arteries shows RICA 123456 and LICA known occlusion  Continue antiplatelet therapy as prescribed Continue management of CAD, HTN and Hyperlipidemia Healthy heart diet,  encouraged exercise at least 4 times per week Follow up in 12 months with duplex ultrasound and physical exam   - Carotid; Future  3. Coronary artery disease of native artery of native heart with stable angina pectoris (HCC) Continue cardiac and antihypertensive medications as already ordered and reviewed, no changes at this time.  Continue statin as ordered and reviewed, no changes at this time  Nitrates PRN for chest pain   4. Atrial flutter, unspecified type ([place]) Continue antiarrhythmia medications as already ordered, these medications have been reviewed and there are no changes at this time.  Continue anticoagulation as ordered by Cardiology Service   5. Essential hypertension Continue antihypertensive medications as already ordered, these medications have been reviewed and there are no changes at this time.   Hortencia Pilar, MD  10/10/2019 9:19 AM

## 2019-10-14 DIAGNOSIS — I480 Paroxysmal atrial fibrillation: Secondary | ICD-10-CM | POA: Diagnosis present

## 2019-10-14 DIAGNOSIS — F411 Generalized anxiety disorder: Secondary | ICD-10-CM | POA: Diagnosis not present

## 2019-10-14 DIAGNOSIS — I10 Essential (primary) hypertension: Secondary | ICD-10-CM | POA: Diagnosis not present

## 2019-10-14 DIAGNOSIS — R0602 Shortness of breath: Secondary | ICD-10-CM | POA: Diagnosis not present

## 2019-10-14 DIAGNOSIS — Z0000 Encounter for general adult medical examination without abnormal findings: Secondary | ICD-10-CM | POA: Diagnosis not present

## 2019-10-14 DIAGNOSIS — J9611 Chronic respiratory failure with hypoxia: Secondary | ICD-10-CM | POA: Diagnosis not present

## 2019-10-14 DIAGNOSIS — H918X3 Other specified hearing loss, bilateral: Secondary | ICD-10-CM | POA: Diagnosis not present

## 2019-10-14 DIAGNOSIS — J849 Interstitial pulmonary disease, unspecified: Secondary | ICD-10-CM | POA: Diagnosis not present

## 2019-10-14 DIAGNOSIS — I739 Peripheral vascular disease, unspecified: Secondary | ICD-10-CM | POA: Diagnosis not present

## 2019-10-14 DIAGNOSIS — F320 Major depressive disorder, single episode, mild: Secondary | ICD-10-CM | POA: Diagnosis not present

## 2019-10-17 ENCOUNTER — Other Ambulatory Visit: Payer: Self-pay | Admitting: Internal Medicine

## 2019-10-17 DIAGNOSIS — J849 Interstitial pulmonary disease, unspecified: Secondary | ICD-10-CM

## 2019-10-31 ENCOUNTER — Ambulatory Visit
Admission: RE | Admit: 2019-10-31 | Discharge: 2019-10-31 | Disposition: A | Discharge status: Home / Self Care | Payer: PPO | Source: Ambulatory Visit | Attending: Internal Medicine | Admitting: Internal Medicine

## 2019-10-31 ENCOUNTER — Other Ambulatory Visit: Payer: Self-pay

## 2019-10-31 DIAGNOSIS — J439 Emphysema, unspecified: Secondary | ICD-10-CM | POA: Diagnosis not present

## 2019-10-31 DIAGNOSIS — J410 Simple chronic bronchitis: Secondary | ICD-10-CM | POA: Diagnosis not present

## 2019-10-31 DIAGNOSIS — J449 Chronic obstructive pulmonary disease, unspecified: Secondary | ICD-10-CM | POA: Diagnosis not present

## 2019-10-31 DIAGNOSIS — J849 Interstitial pulmonary disease, unspecified: Secondary | ICD-10-CM | POA: Diagnosis not present

## 2019-10-31 IMAGING — CT CT CHEST HIGH RESOLUTION W/O CM
1 of 3 series · 14 of 33 positions shown, 18 images · non-contrast
Comparison: [DATE] chest radiograph. [DATE] chest CT
angiogram.

CLINICAL DATA: Worsening chronic dyspnea on 3L oxygen. Former
smoker. COPD.

EXAM:
CT CHEST WITHOUT CONTRAST
TECHNIQUE: Multidetector CT imaging of the chest was performed following the
standard protocol without intravenous contrast. High resolution
imaging of the lungs, as well as inspiratory and expiratory imaging,
was performed.

[Series 2: thorax · axial · 0.68mm/px · z∈[-675,-387]mm · 14 of 158 slices shown, 18 images]
[im 7/158  mediastinal]
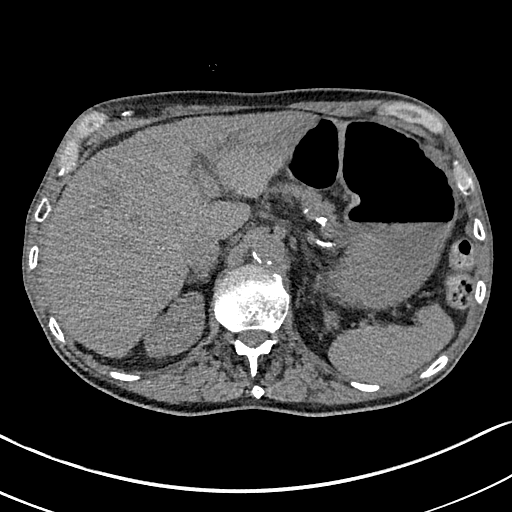
[im 7/158  lung]
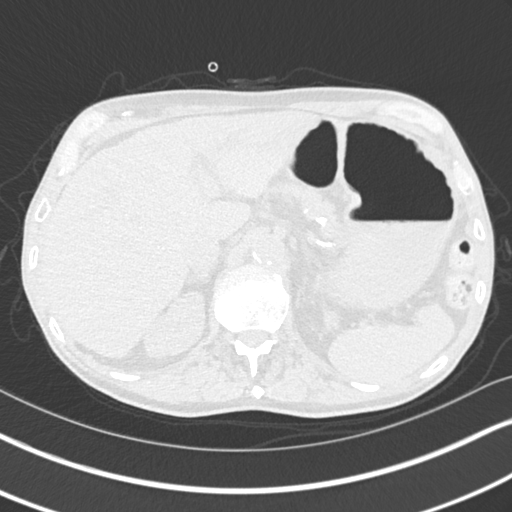
[im 21/158  lung]
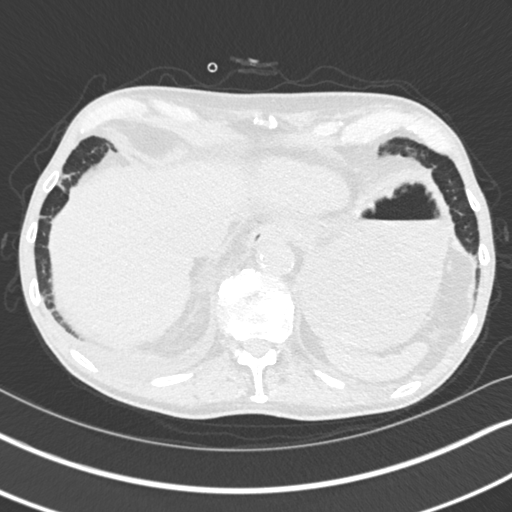
[im 35/158  lung]
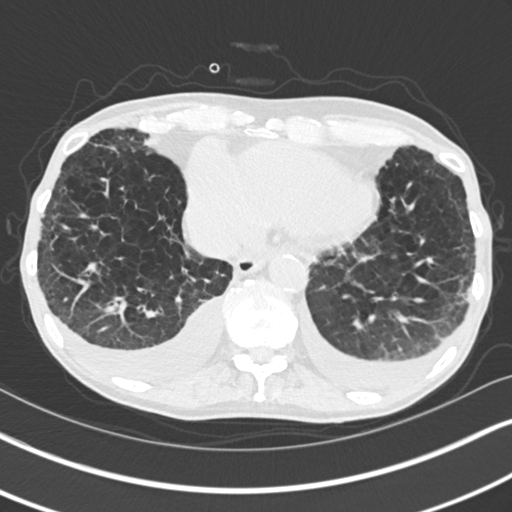
[im 48/158  lung]
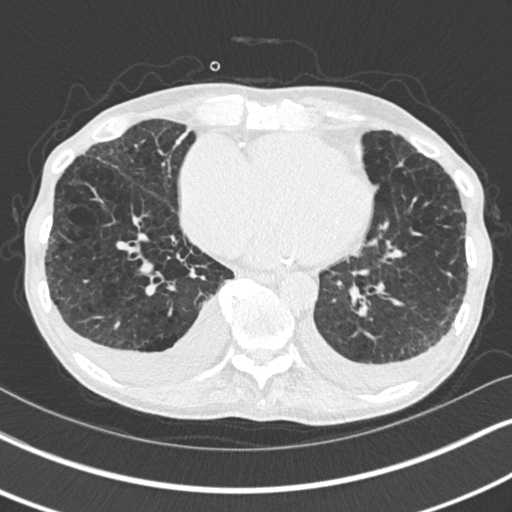
[im 55/158  mediastinal]
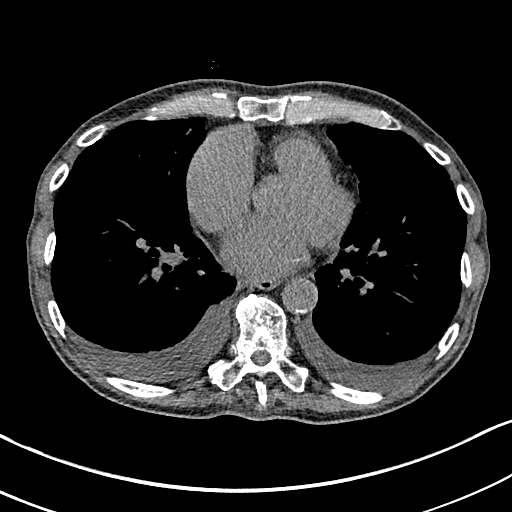
[im 55/158  lung]
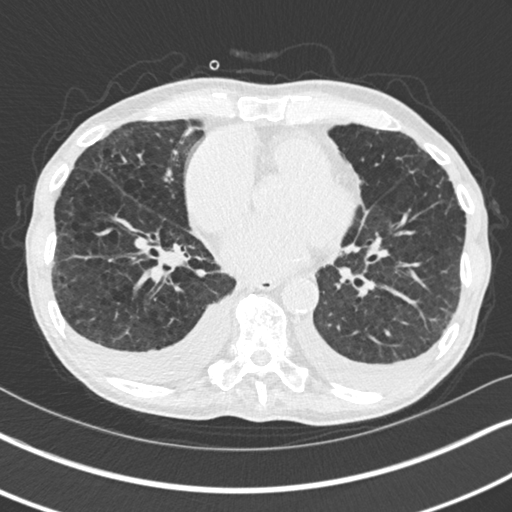
[im 62/158  lung]
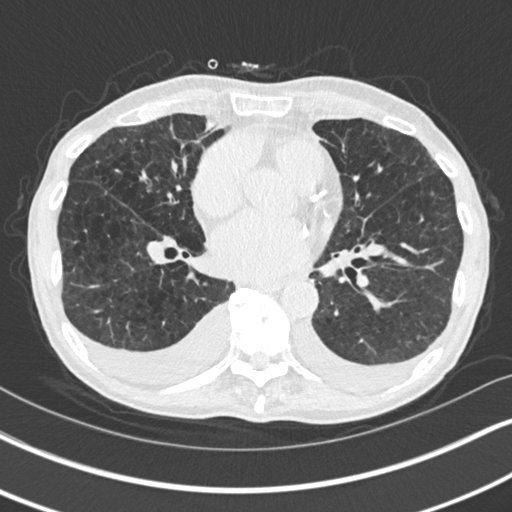
[im 75/158  lung]
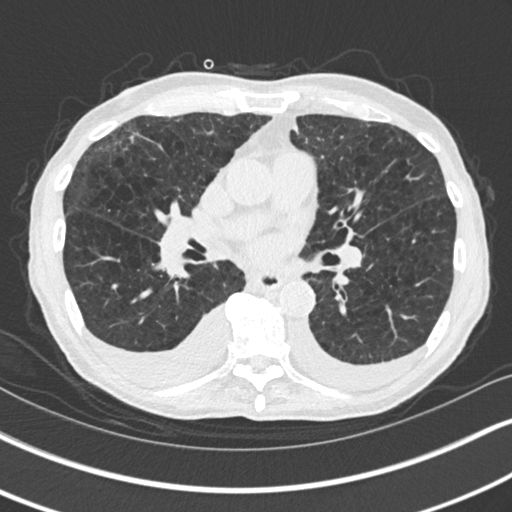
[im 82/158  lung]
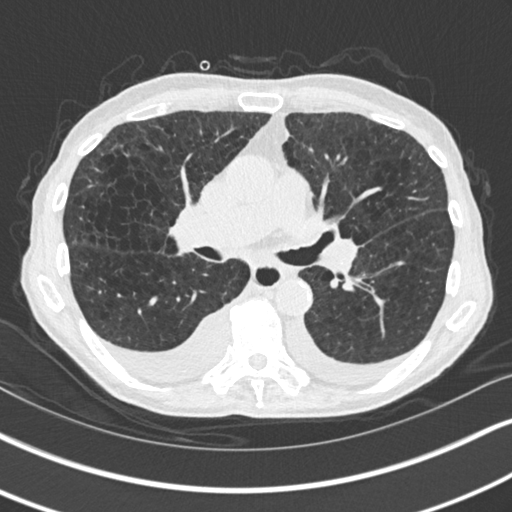
[im 96/158  mediastinal]
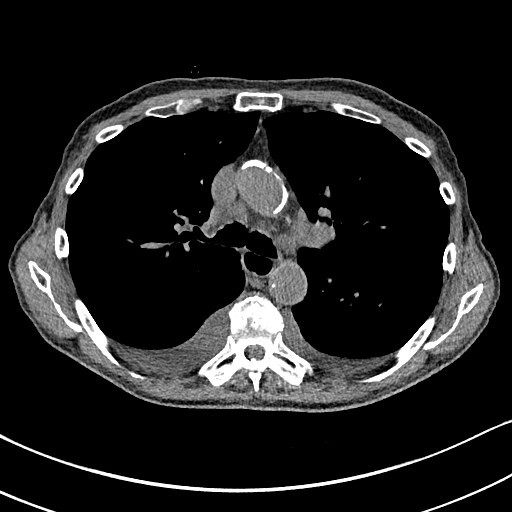
[im 96/158  lung]
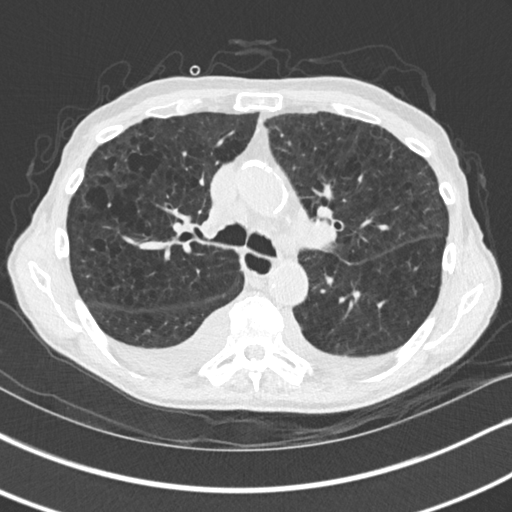
[im 103/158  lung]
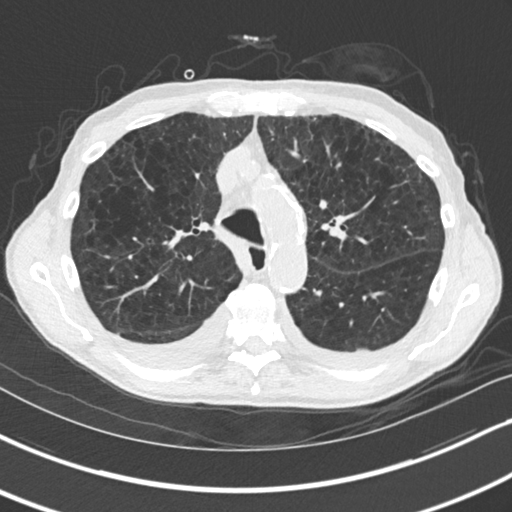
[im 110/158  lung]
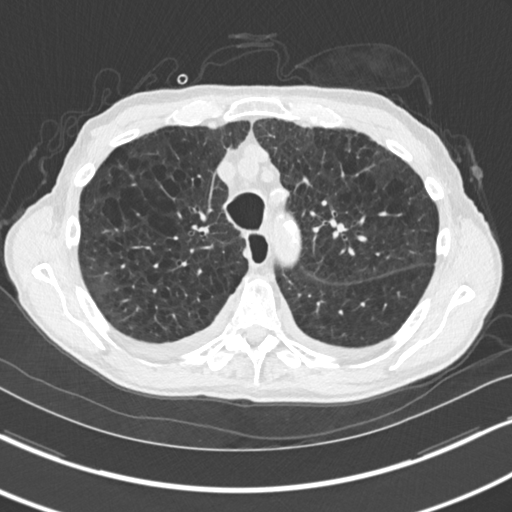
[im 123/158  lung]
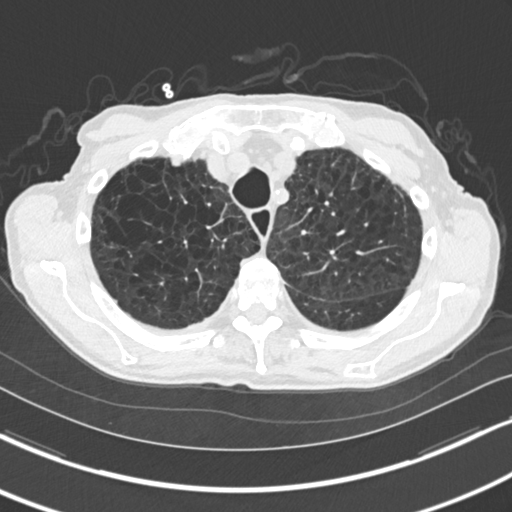
[im 137/158  mediastinal]
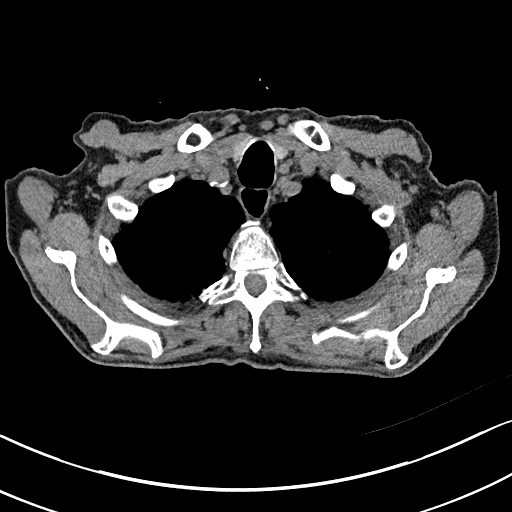
[im 137/158  lung]
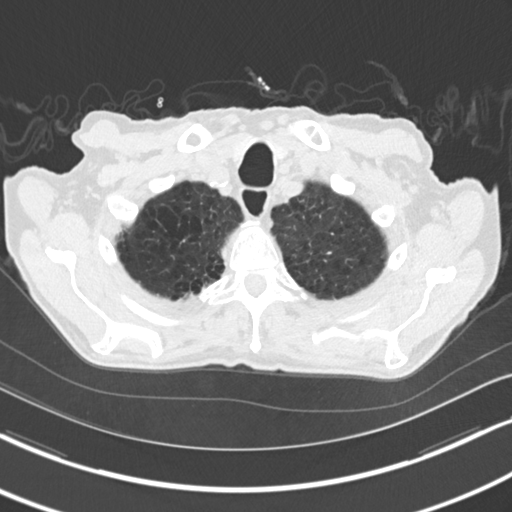
[im 151/158  lung]
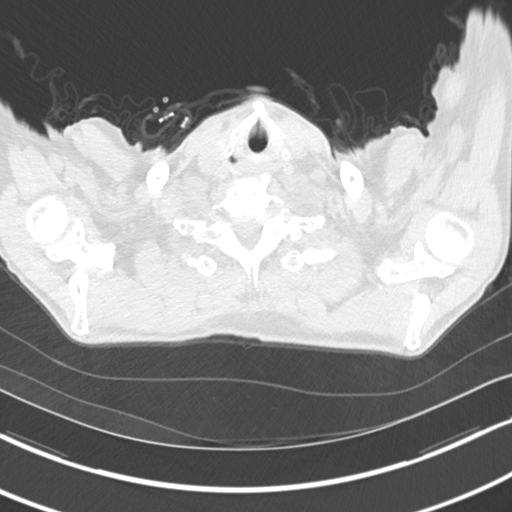

[14 of 33 positions shown; findings below may reference images not displayed]

FINDINGS: Cardiovascular: Normal heart size. No significant pericardial
effusion/thickening. Three-vessel coronary atherosclerosis.
Atherosclerotic nonaneurysmal thoracic aorta. Normal caliber
pulmonary arteries.

Mediastinum/Nodes: No discrete thyroid nodules. Unremarkable
esophagus. No pathologically enlarged axillary, mediastinal or hilar
lymph nodes, noting limited sensitivity for the detection of hilar
adenopathy on this noncontrast study.

Lungs/Pleura: No pneumothorax. Small dependent bilateral pleural
effusions. Severe centrilobular and paraseptal emphysema with mild
diffuse bronchial wall thickening. No central airway stenoses. No
acute consolidative airspace disease or lung masses. Vague focal
subpleural subsolid 1.4 x 0.7 cm opacity in the posterior left lower
lobe (series 3/image 61). Otherwise no significant pulmonary
nodules. Mild to moderate patchy confluent subpleural reticulation
and ground-glass opacity throughout both lungs with a basilar
predominance, with suggestion of minimal associated traction
bronchiolectasis. No frank honeycombing. No significant lobular air
trapping or evidence of tracheobronchomalacia on the expiration
sequence.

Upper abdomen: No acute abnormality.

Musculoskeletal: No aggressive appearing focal osseous lesions.
Moderate thoracic spondylosis.
IMPRESSION: 1. Severe centrilobular and paraseptal emphysema with mild diffuse
bronchial wall thickening, compatible with the provided history of
COPD.
2. Small dependent bilateral pleural effusions.
3. Spectrum of findings suggestive of basilar predominant fibrotic
interstitial lung disease without honeycombing. Findings are
indeterminate for UIP per consensus guidelines: Diagnosis of
Idiopathic Pulmonary Fibrosis: An Official ATS/ERS/JRS/ALAT Clinical
Practice Guideline. Am J Respir Crit Care Med Vol 198, IJAZ 5,
[VC]-e[DATE].
4. Vague focal subpleural subsolid 1.4 cm opacity in the posterior
left lower lobe. Follow-up non-contrast CT recommended at 3-6 months
to confirm persistence. If unchanged, and solid component remains <6
mm, annual CT is recommended until 5 years of stability has been
established. If persistent these nodules should be considered highly
suspicious if the solid component of the nodule is 6 mm or greater
in size and enlarging. This recommendation follows the consensus
statement: Guidelines for Management of Incidental Pulmonary Nodules
Detected on CT Images: From the [HOSPITAL] [VC]; Radiology
5. Three-vessel coronary atherosclerosis.
6. Aortic Atherosclerosis ([VC]-[VC]) and Emphysema ([VC]-[VC]).

## 2019-11-02 ENCOUNTER — Other Ambulatory Visit: Payer: Self-pay | Admitting: Internal Medicine

## 2019-11-02 DIAGNOSIS — R911 Solitary pulmonary nodule: Secondary | ICD-10-CM

## 2019-11-02 DIAGNOSIS — J8410 Pulmonary fibrosis, unspecified: Secondary | ICD-10-CM

## 2019-11-02 DIAGNOSIS — J849 Interstitial pulmonary disease, unspecified: Secondary | ICD-10-CM

## 2019-11-04 DIAGNOSIS — J441 Chronic obstructive pulmonary disease with (acute) exacerbation: Secondary | ICD-10-CM | POA: Diagnosis not present

## 2019-11-04 DIAGNOSIS — J9611 Chronic respiratory failure with hypoxia: Secondary | ICD-10-CM | POA: Diagnosis not present

## 2019-11-04 DIAGNOSIS — J8410 Pulmonary fibrosis, unspecified: Secondary | ICD-10-CM | POA: Diagnosis not present

## 2019-11-05 DIAGNOSIS — J449 Chronic obstructive pulmonary disease, unspecified: Secondary | ICD-10-CM | POA: Diagnosis not present

## 2019-11-17 DIAGNOSIS — I2510 Atherosclerotic heart disease of native coronary artery without angina pectoris: Secondary | ICD-10-CM | POA: Diagnosis not present

## 2019-11-17 DIAGNOSIS — I480 Paroxysmal atrial fibrillation: Secondary | ICD-10-CM | POA: Diagnosis not present

## 2019-11-17 DIAGNOSIS — I10 Essential (primary) hypertension: Secondary | ICD-10-CM | POA: Diagnosis not present

## 2019-11-17 DIAGNOSIS — I483 Typical atrial flutter: Secondary | ICD-10-CM | POA: Diagnosis not present

## 2019-11-17 DIAGNOSIS — R0602 Shortness of breath: Secondary | ICD-10-CM | POA: Diagnosis not present

## 2019-11-17 DIAGNOSIS — Z789 Other specified health status: Secondary | ICD-10-CM | POA: Diagnosis not present

## 2019-11-17 DIAGNOSIS — E785 Hyperlipidemia, unspecified: Secondary | ICD-10-CM | POA: Diagnosis not present

## 2019-11-17 DIAGNOSIS — J449 Chronic obstructive pulmonary disease, unspecified: Secondary | ICD-10-CM | POA: Diagnosis not present

## 2019-11-24 DIAGNOSIS — J849 Interstitial pulmonary disease, unspecified: Secondary | ICD-10-CM | POA: Diagnosis not present

## 2019-11-24 DIAGNOSIS — R911 Solitary pulmonary nodule: Secondary | ICD-10-CM | POA: Diagnosis not present

## 2019-12-03 DIAGNOSIS — J449 Chronic obstructive pulmonary disease, unspecified: Secondary | ICD-10-CM | POA: Diagnosis not present

## 2019-12-14 ENCOUNTER — Other Ambulatory Visit
Admission: RE | Admit: 2019-12-14 | Discharge: 2019-12-14 | Disposition: A | Discharge status: Home / Self Care | Payer: PPO | Source: Ambulatory Visit | Attending: Specialist | Admitting: Specialist

## 2019-12-14 DIAGNOSIS — R600 Localized edema: Secondary | ICD-10-CM | POA: Diagnosis not present

## 2019-12-14 DIAGNOSIS — J209 Acute bronchitis, unspecified: Secondary | ICD-10-CM | POA: Diagnosis not present

## 2019-12-14 DIAGNOSIS — R0902 Hypoxemia: Secondary | ICD-10-CM | POA: Diagnosis not present

## 2019-12-14 DIAGNOSIS — R0602 Shortness of breath: Secondary | ICD-10-CM | POA: Diagnosis not present

## 2019-12-14 DIAGNOSIS — J449 Chronic obstructive pulmonary disease, unspecified: Secondary | ICD-10-CM | POA: Diagnosis not present

## 2019-12-14 LAB — FIBRIN DERIVATIVES D-DIMER (ARMC ONLY): Fibrin derivatives D-dimer (ARMC): 607.07 ng/mL (FEU) — ABNORMAL HIGH (ref 0.00–499.00)

## 2019-12-19 DIAGNOSIS — J449 Chronic obstructive pulmonary disease, unspecified: Secondary | ICD-10-CM | POA: Diagnosis not present

## 2019-12-29 DIAGNOSIS — R8290 Unspecified abnormal findings in urine: Secondary | ICD-10-CM | POA: Diagnosis not present

## 2019-12-29 DIAGNOSIS — R399 Unspecified symptoms and signs involving the genitourinary system: Secondary | ICD-10-CM | POA: Diagnosis not present

## 2020-01-03 DIAGNOSIS — J449 Chronic obstructive pulmonary disease, unspecified: Secondary | ICD-10-CM | POA: Diagnosis not present

## 2020-01-05 DIAGNOSIS — R600 Localized edema: Secondary | ICD-10-CM | POA: Diagnosis not present

## 2020-01-05 DIAGNOSIS — J439 Emphysema, unspecified: Secondary | ICD-10-CM | POA: Diagnosis not present

## 2020-01-05 DIAGNOSIS — R0600 Dyspnea, unspecified: Secondary | ICD-10-CM | POA: Diagnosis not present

## 2020-01-05 DIAGNOSIS — J84112 Idiopathic pulmonary fibrosis: Secondary | ICD-10-CM | POA: Diagnosis not present

## 2020-01-05 DIAGNOSIS — T887XXA Unspecified adverse effect of drug or medicament, initial encounter: Secondary | ICD-10-CM | POA: Diagnosis not present

## 2020-01-24 DIAGNOSIS — J449 Chronic obstructive pulmonary disease, unspecified: Secondary | ICD-10-CM | POA: Diagnosis not present

## 2020-02-01 DIAGNOSIS — I10 Essential (primary) hypertension: Secondary | ICD-10-CM | POA: Diagnosis not present

## 2020-02-01 DIAGNOSIS — H918X3 Other specified hearing loss, bilateral: Secondary | ICD-10-CM | POA: Diagnosis not present

## 2020-02-01 DIAGNOSIS — R8290 Unspecified abnormal findings in urine: Secondary | ICD-10-CM | POA: Diagnosis not present

## 2020-02-01 DIAGNOSIS — J849 Interstitial pulmonary disease, unspecified: Secondary | ICD-10-CM | POA: Diagnosis not present

## 2020-02-01 DIAGNOSIS — I739 Peripheral vascular disease, unspecified: Secondary | ICD-10-CM | POA: Diagnosis not present

## 2020-02-01 DIAGNOSIS — R0602 Shortness of breath: Secondary | ICD-10-CM | POA: Diagnosis not present

## 2020-02-01 DIAGNOSIS — Z0000 Encounter for general adult medical examination without abnormal findings: Secondary | ICD-10-CM | POA: Diagnosis not present

## 2020-02-01 DIAGNOSIS — J9611 Chronic respiratory failure with hypoxia: Secondary | ICD-10-CM | POA: Diagnosis not present

## 2020-02-02 DIAGNOSIS — J449 Chronic obstructive pulmonary disease, unspecified: Secondary | ICD-10-CM | POA: Diagnosis not present

## 2020-02-08 DIAGNOSIS — J9621 Acute and chronic respiratory failure with hypoxia: Secondary | ICD-10-CM | POA: Diagnosis present

## 2020-02-08 DIAGNOSIS — I10 Essential (primary) hypertension: Secondary | ICD-10-CM | POA: Diagnosis not present

## 2020-02-08 DIAGNOSIS — J8410 Pulmonary fibrosis, unspecified: Secondary | ICD-10-CM | POA: Diagnosis present

## 2020-02-08 DIAGNOSIS — I483 Typical atrial flutter: Secondary | ICD-10-CM | POA: Diagnosis not present

## 2020-02-08 DIAGNOSIS — B9620 Unspecified Escherichia coli [E. coli] as the cause of diseases classified elsewhere: Secondary | ICD-10-CM | POA: Diagnosis not present

## 2020-02-08 DIAGNOSIS — J449 Chronic obstructive pulmonary disease, unspecified: Secondary | ICD-10-CM | POA: Diagnosis not present

## 2020-02-08 DIAGNOSIS — N390 Urinary tract infection, site not specified: Secondary | ICD-10-CM | POA: Diagnosis not present

## 2020-02-08 DIAGNOSIS — F5104 Psychophysiologic insomnia: Secondary | ICD-10-CM | POA: Diagnosis not present

## 2020-02-08 DIAGNOSIS — Z789 Other specified health status: Secondary | ICD-10-CM | POA: Diagnosis not present

## 2020-02-08 DIAGNOSIS — I739 Peripheral vascular disease, unspecified: Secondary | ICD-10-CM | POA: Diagnosis not present

## 2020-02-08 DIAGNOSIS — R413 Other amnesia: Secondary | ICD-10-CM | POA: Diagnosis not present

## 2020-02-08 DIAGNOSIS — H9193 Unspecified hearing loss, bilateral: Secondary | ICD-10-CM | POA: Diagnosis not present

## 2020-02-08 DIAGNOSIS — J9611 Chronic respiratory failure with hypoxia: Secondary | ICD-10-CM | POA: Diagnosis not present

## 2020-02-09 ENCOUNTER — Other Ambulatory Visit: Payer: Self-pay | Admitting: Specialist

## 2020-02-09 DIAGNOSIS — J849 Interstitial pulmonary disease, unspecified: Secondary | ICD-10-CM

## 2020-02-09 DIAGNOSIS — R911 Solitary pulmonary nodule: Secondary | ICD-10-CM

## 2020-03-04 DIAGNOSIS — J449 Chronic obstructive pulmonary disease, unspecified: Secondary | ICD-10-CM | POA: Diagnosis not present

## 2020-03-12 DIAGNOSIS — J410 Simple chronic bronchitis: Secondary | ICD-10-CM | POA: Diagnosis not present

## 2020-03-12 DIAGNOSIS — J449 Chronic obstructive pulmonary disease, unspecified: Secondary | ICD-10-CM | POA: Diagnosis not present

## 2020-03-19 ENCOUNTER — Ambulatory Visit
Admission: RE | Admit: 2020-03-19 | Discharge: 2020-03-19 | Disposition: A | Discharge status: Home / Self Care | Payer: PPO | Source: Ambulatory Visit | Attending: Specialist | Admitting: Specialist

## 2020-03-19 ENCOUNTER — Other Ambulatory Visit: Payer: Self-pay

## 2020-03-19 DIAGNOSIS — J849 Interstitial pulmonary disease, unspecified: Secondary | ICD-10-CM | POA: Insufficient documentation

## 2020-03-19 DIAGNOSIS — R911 Solitary pulmonary nodule: Secondary | ICD-10-CM | POA: Insufficient documentation

## 2020-03-19 DIAGNOSIS — J432 Centrilobular emphysema: Secondary | ICD-10-CM | POA: Diagnosis not present

## 2020-03-19 DIAGNOSIS — J90 Pleural effusion, not elsewhere classified: Secondary | ICD-10-CM | POA: Diagnosis not present

## 2020-03-19 IMAGING — CT CT CHEST W/O CM
2 of 3 series · 15 of 36 positions shown, 18 images · non-contrast
Comparison: [DATE]

CLINICAL DATA: Lung nodule, former smoker, COPD

EXAM:
CT CHEST WITHOUT CONTRAST
TECHNIQUE: Multidetector CT imaging of the chest was performed following the
standard protocol without IV contrast.

[Series 2: thorax · axial · 0.64mm/px · z∈[-307,-43]mm · 12 of 156 slices shown, 15 images]
[im 12/156  mediastinal]
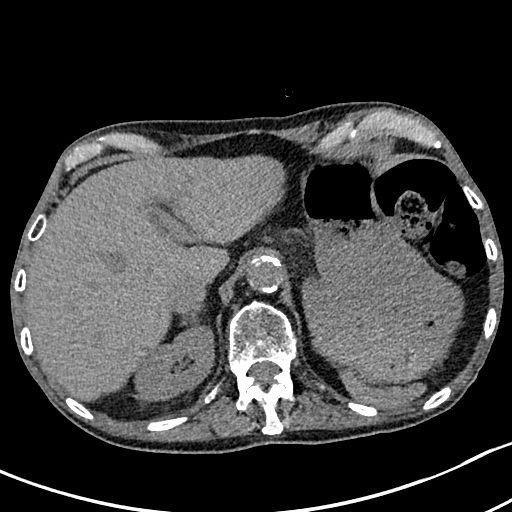
[im 12/156  lung]
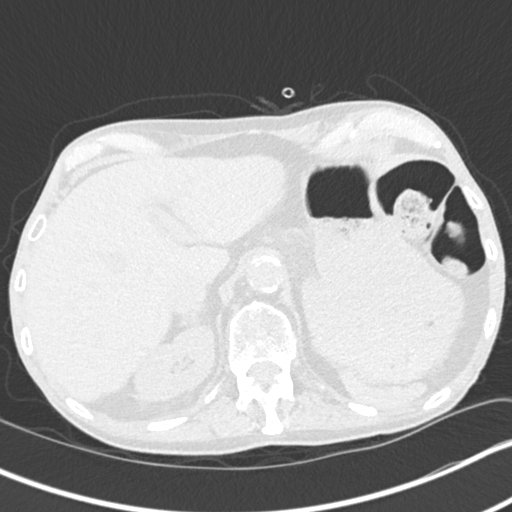
[im 23/156  lung]
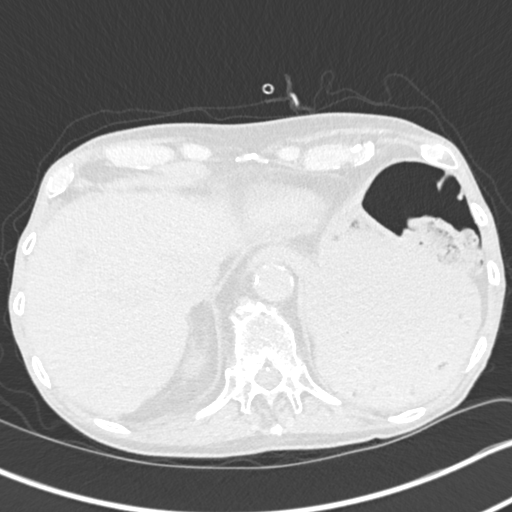
[im 35/156  lung]
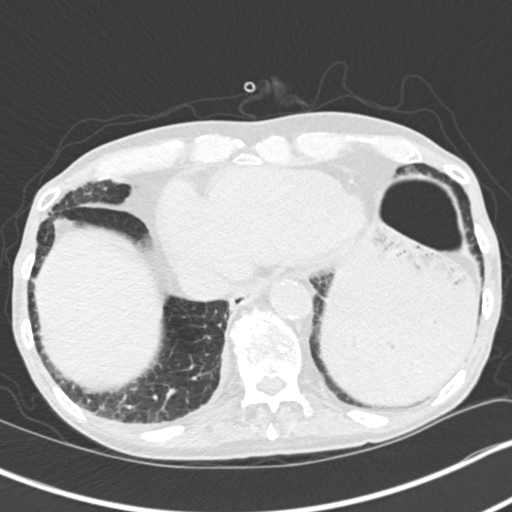
[im 46/156  lung]
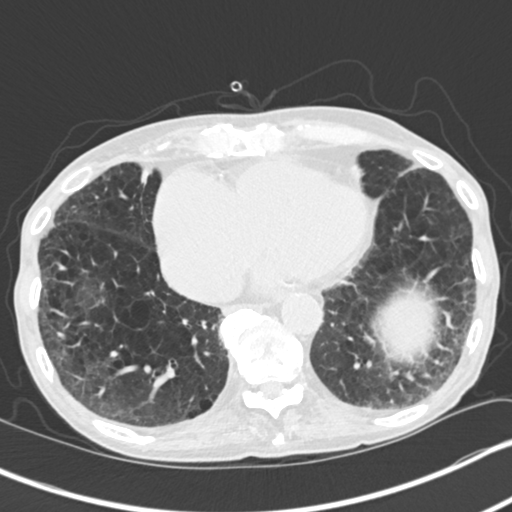
[im 58/156  mediastinal]
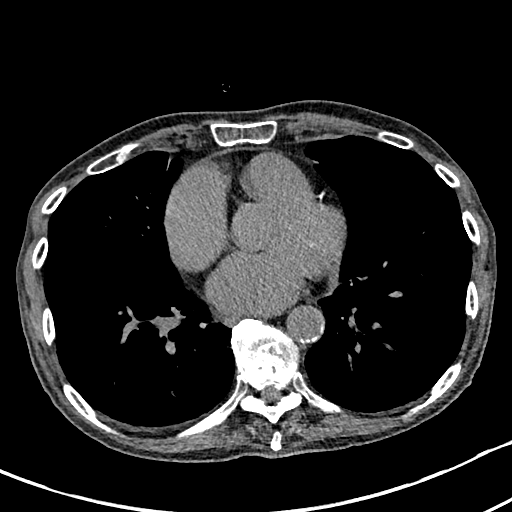
[im 58/156  lung]
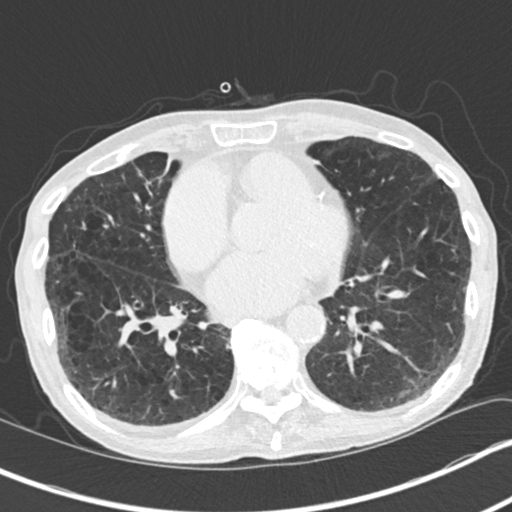
[im 69/156  lung]
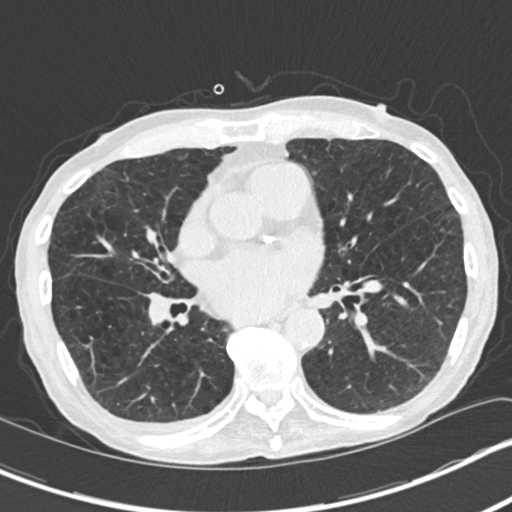
[im 87/156  lung]
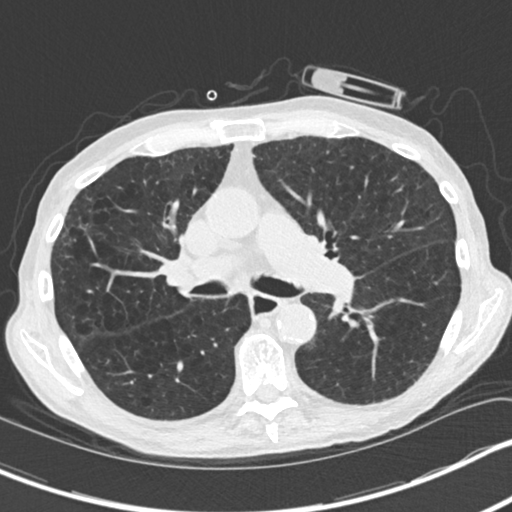
[im 98/156  lung]
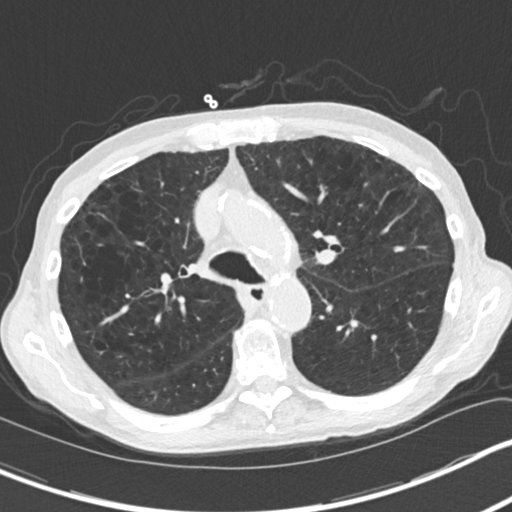
[im 110/156  mediastinal]
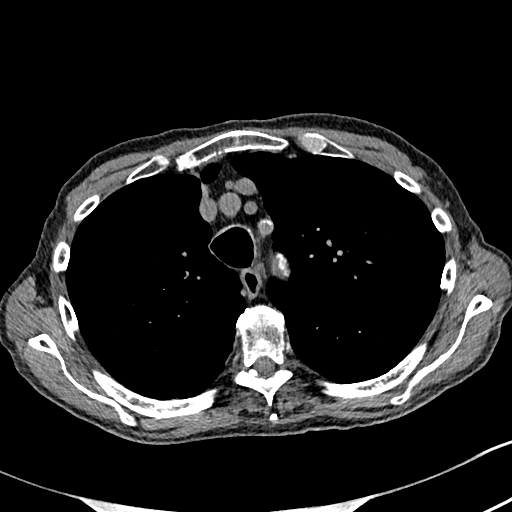
[im 110/156  lung]
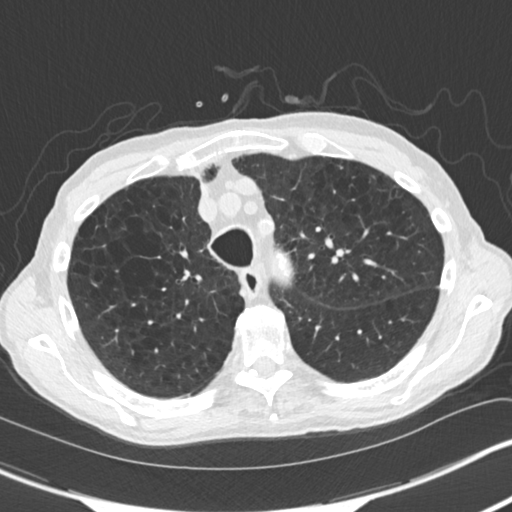
[im 121/156  lung]
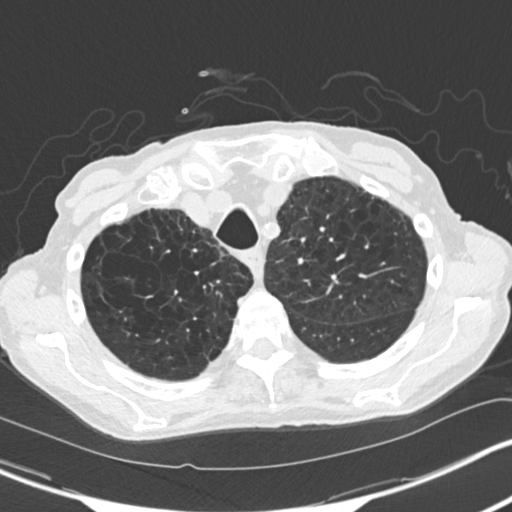
[im 133/156  lung]
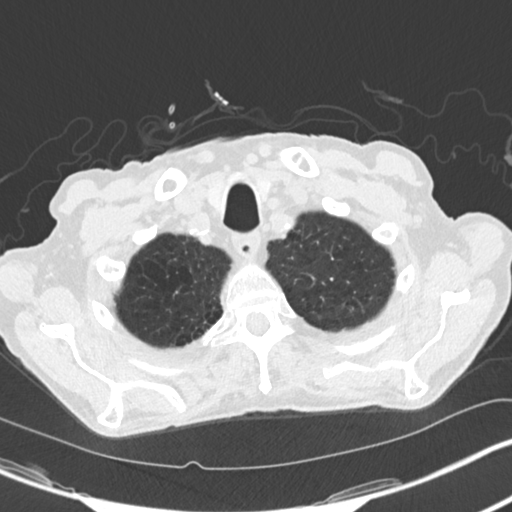
[im 144/156  lung]
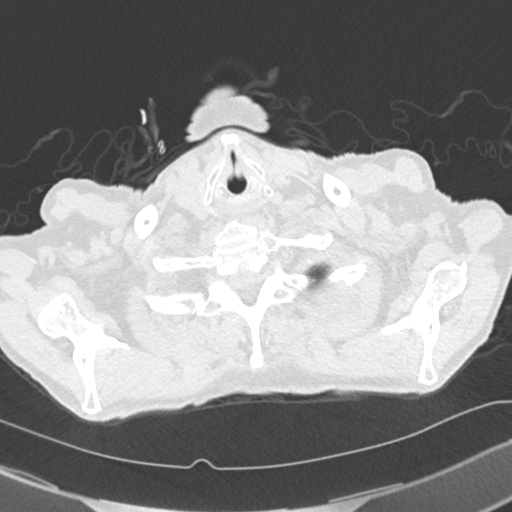

[Series 5: coronal · coronal · 0.62mm/px · 3 of 120 slices shown]
[im 24/120  lung]
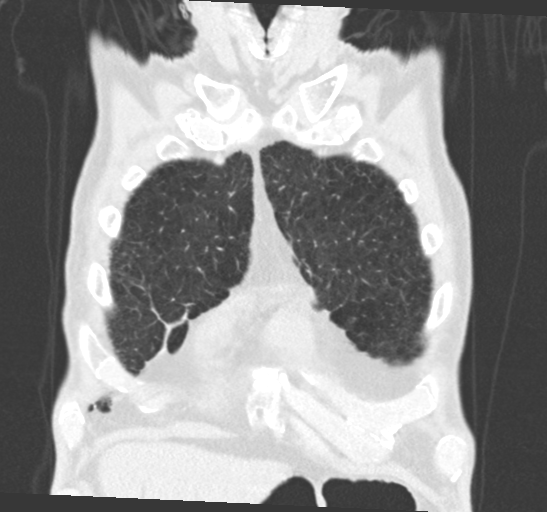
[im 48/120  lung]
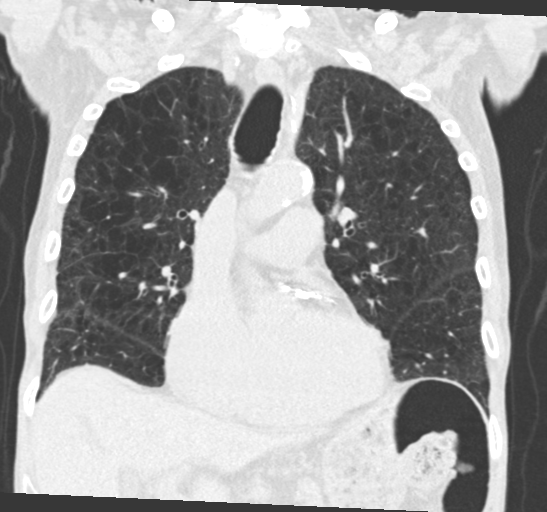
[im 72/120  lung]
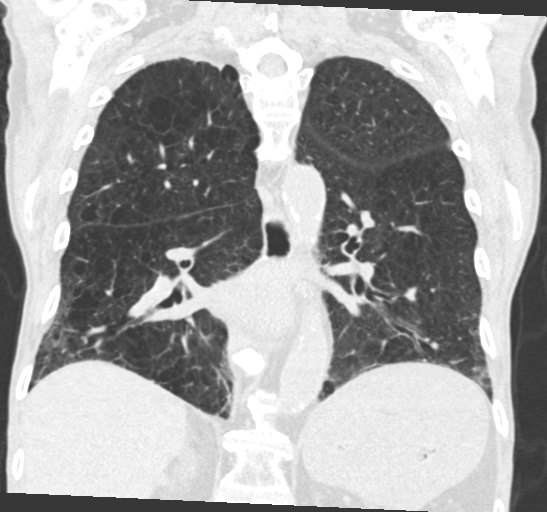

[15 of 36 positions shown; findings below may reference images not displayed]

FINDINGS: Cardiovascular: Aortic atherosclerosis. Normal heart size.
Three-vessel coronary artery calcifications. No pericardial
effusion.

Mediastinum/Nodes: No enlarged mediastinal, hilar, or axillary lymph
nodes. Thyroid gland, trachea, and esophagus demonstrate no
significant findings.

Lungs/Pleura: Severe centrilobular emphysema. Trace right pleural
effusion, significantly reduced compared to prior examination.
Resolved left pleural effusion. A previously noted heterogeneous
airspace opacity of the dependent superior segment left lower lobe
is resolved.

Upper Abdomen: No acute abnormality.

Musculoskeletal: No chest wall mass or suspicious bone lesions
identified.
IMPRESSION: 1. Trace right pleural effusion, significantly reduced compared to
prior examination. Resolved left pleural effusion.
2. A previously noted heterogeneous airspace opacity of the
dependent superior segment left lower lobe is resolved, consistent
with resolution of infection or inflammation.
3. Severe emphysema.  Emphysema ([3Y]-[3Y]).
4. Coronary artery disease. Aortic Atherosclerosis ([3Y]-[3Y]).

## 2020-03-21 DIAGNOSIS — I10 Essential (primary) hypertension: Secondary | ICD-10-CM | POA: Diagnosis not present

## 2020-03-21 DIAGNOSIS — R0602 Shortness of breath: Secondary | ICD-10-CM | POA: Diagnosis not present

## 2020-03-21 DIAGNOSIS — I2510 Atherosclerotic heart disease of native coronary artery without angina pectoris: Secondary | ICD-10-CM | POA: Diagnosis not present

## 2020-03-21 DIAGNOSIS — Z789 Other specified health status: Secondary | ICD-10-CM | POA: Diagnosis not present

## 2020-03-21 DIAGNOSIS — I480 Paroxysmal atrial fibrillation: Secondary | ICD-10-CM | POA: Diagnosis not present

## 2020-03-21 DIAGNOSIS — R55 Syncope and collapse: Secondary | ICD-10-CM | POA: Diagnosis not present

## 2020-03-21 DIAGNOSIS — E785 Hyperlipidemia, unspecified: Secondary | ICD-10-CM | POA: Diagnosis not present

## 2020-03-21 DIAGNOSIS — I483 Typical atrial flutter: Secondary | ICD-10-CM | POA: Diagnosis not present

## 2020-03-21 DIAGNOSIS — J449 Chronic obstructive pulmonary disease, unspecified: Secondary | ICD-10-CM | POA: Diagnosis not present

## 2020-03-22 DIAGNOSIS — J449 Chronic obstructive pulmonary disease, unspecified: Secondary | ICD-10-CM | POA: Diagnosis not present

## 2020-03-22 DIAGNOSIS — J849 Interstitial pulmonary disease, unspecified: Secondary | ICD-10-CM | POA: Diagnosis not present

## 2020-03-22 DIAGNOSIS — Z9981 Dependence on supplemental oxygen: Secondary | ICD-10-CM | POA: Diagnosis not present

## 2020-03-22 DIAGNOSIS — R0600 Dyspnea, unspecified: Secondary | ICD-10-CM | POA: Diagnosis not present

## 2020-04-03 DIAGNOSIS — J449 Chronic obstructive pulmonary disease, unspecified: Secondary | ICD-10-CM | POA: Diagnosis not present

## 2020-04-18 DIAGNOSIS — J449 Chronic obstructive pulmonary disease, unspecified: Secondary | ICD-10-CM | POA: Diagnosis not present

## 2020-05-04 DIAGNOSIS — J449 Chronic obstructive pulmonary disease, unspecified: Secondary | ICD-10-CM | POA: Diagnosis not present

## 2020-05-17 DIAGNOSIS — J410 Simple chronic bronchitis: Secondary | ICD-10-CM | POA: Diagnosis not present

## 2020-05-17 DIAGNOSIS — J449 Chronic obstructive pulmonary disease, unspecified: Secondary | ICD-10-CM | POA: Diagnosis not present

## 2020-05-21 ENCOUNTER — Emergency Department: Payer: PPO

## 2020-05-21 ENCOUNTER — Other Ambulatory Visit: Payer: Self-pay

## 2020-05-21 ENCOUNTER — Emergency Department
Admission: EM | Admit: 2020-05-21 | Discharge: 2020-05-21 | Disposition: A | Discharge status: Home / Self Care | Payer: PPO | Attending: Emergency Medicine | Admitting: Emergency Medicine

## 2020-05-21 ENCOUNTER — Encounter: Payer: Self-pay | Admitting: Emergency Medicine

## 2020-05-21 DIAGNOSIS — J449 Chronic obstructive pulmonary disease, unspecified: Secondary | ICD-10-CM | POA: Diagnosis not present

## 2020-05-21 DIAGNOSIS — S79911A Unspecified injury of right hip, initial encounter: Secondary | ICD-10-CM | POA: Diagnosis not present

## 2020-05-21 DIAGNOSIS — Y92009 Unspecified place in unspecified non-institutional (private) residence as the place of occurrence of the external cause: Secondary | ICD-10-CM | POA: Diagnosis not present

## 2020-05-21 DIAGNOSIS — S7001XA Contusion of right hip, initial encounter: Secondary | ICD-10-CM | POA: Insufficient documentation

## 2020-05-21 DIAGNOSIS — Z7951 Long term (current) use of inhaled steroids: Secondary | ICD-10-CM | POA: Insufficient documentation

## 2020-05-21 DIAGNOSIS — R52 Pain, unspecified: Secondary | ICD-10-CM | POA: Diagnosis not present

## 2020-05-21 DIAGNOSIS — R0902 Hypoxemia: Secondary | ICD-10-CM | POA: Diagnosis not present

## 2020-05-21 DIAGNOSIS — Y999 Unspecified external cause status: Secondary | ICD-10-CM | POA: Insufficient documentation

## 2020-05-21 DIAGNOSIS — I2510 Atherosclerotic heart disease of native coronary artery without angina pectoris: Secondary | ICD-10-CM | POA: Insufficient documentation

## 2020-05-21 DIAGNOSIS — M1611 Unilateral primary osteoarthritis, right hip: Secondary | ICD-10-CM | POA: Diagnosis not present

## 2020-05-21 DIAGNOSIS — I110 Hypertensive heart disease with heart failure: Secondary | ICD-10-CM | POA: Diagnosis not present

## 2020-05-21 DIAGNOSIS — Y939 Activity, unspecified: Secondary | ICD-10-CM | POA: Diagnosis not present

## 2020-05-21 DIAGNOSIS — Z7952 Long term (current) use of systemic steroids: Secondary | ICD-10-CM | POA: Insufficient documentation

## 2020-05-21 DIAGNOSIS — Z7901 Long term (current) use of anticoagulants: Secondary | ICD-10-CM | POA: Insufficient documentation

## 2020-05-21 DIAGNOSIS — I4892 Unspecified atrial flutter: Secondary | ICD-10-CM | POA: Diagnosis not present

## 2020-05-21 DIAGNOSIS — R0689 Other abnormalities of breathing: Secondary | ICD-10-CM | POA: Diagnosis not present

## 2020-05-21 DIAGNOSIS — S60221A Contusion of right hand, initial encounter: Secondary | ICD-10-CM | POA: Diagnosis not present

## 2020-05-21 DIAGNOSIS — W19XXXA Unspecified fall, initial encounter: Secondary | ICD-10-CM | POA: Insufficient documentation

## 2020-05-21 DIAGNOSIS — Z79899 Other long term (current) drug therapy: Secondary | ICD-10-CM | POA: Insufficient documentation

## 2020-05-21 DIAGNOSIS — M7061 Trochanteric bursitis, right hip: Secondary | ICD-10-CM | POA: Diagnosis not present

## 2020-05-21 DIAGNOSIS — Z87891 Personal history of nicotine dependence: Secondary | ICD-10-CM | POA: Insufficient documentation

## 2020-05-21 DIAGNOSIS — S76211A Strain of adductor muscle, fascia and tendon of right thigh, initial encounter: Secondary | ICD-10-CM | POA: Diagnosis not present

## 2020-05-21 DIAGNOSIS — Z043 Encounter for examination and observation following other accident: Secondary | ICD-10-CM | POA: Diagnosis not present

## 2020-05-21 DIAGNOSIS — I509 Heart failure, unspecified: Secondary | ICD-10-CM | POA: Diagnosis not present

## 2020-05-21 IMAGING — CR DG HIP (WITH OR WITHOUT PELVIS) 2-3V*R*
3 series · 3 of 3 positions shown · non-contrast
Comparison: None.

CLINICAL DATA: Fall few days prior with pain

EXAM:
DG HIP (WITH OR WITHOUT PELVIS) 2-3V RIGHT

[pelvis ap]
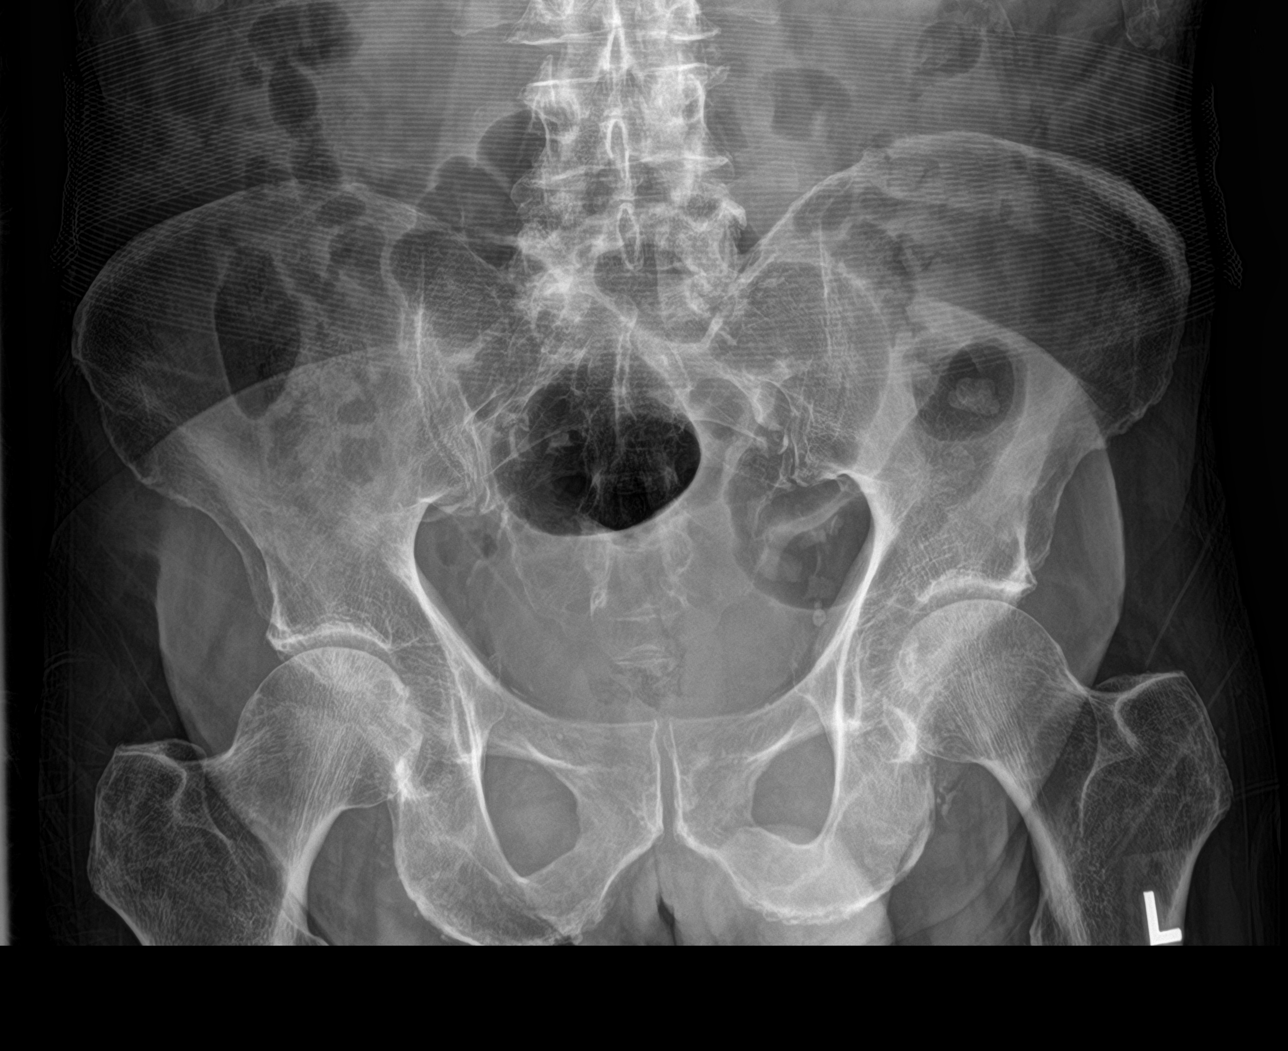

[hip ap]
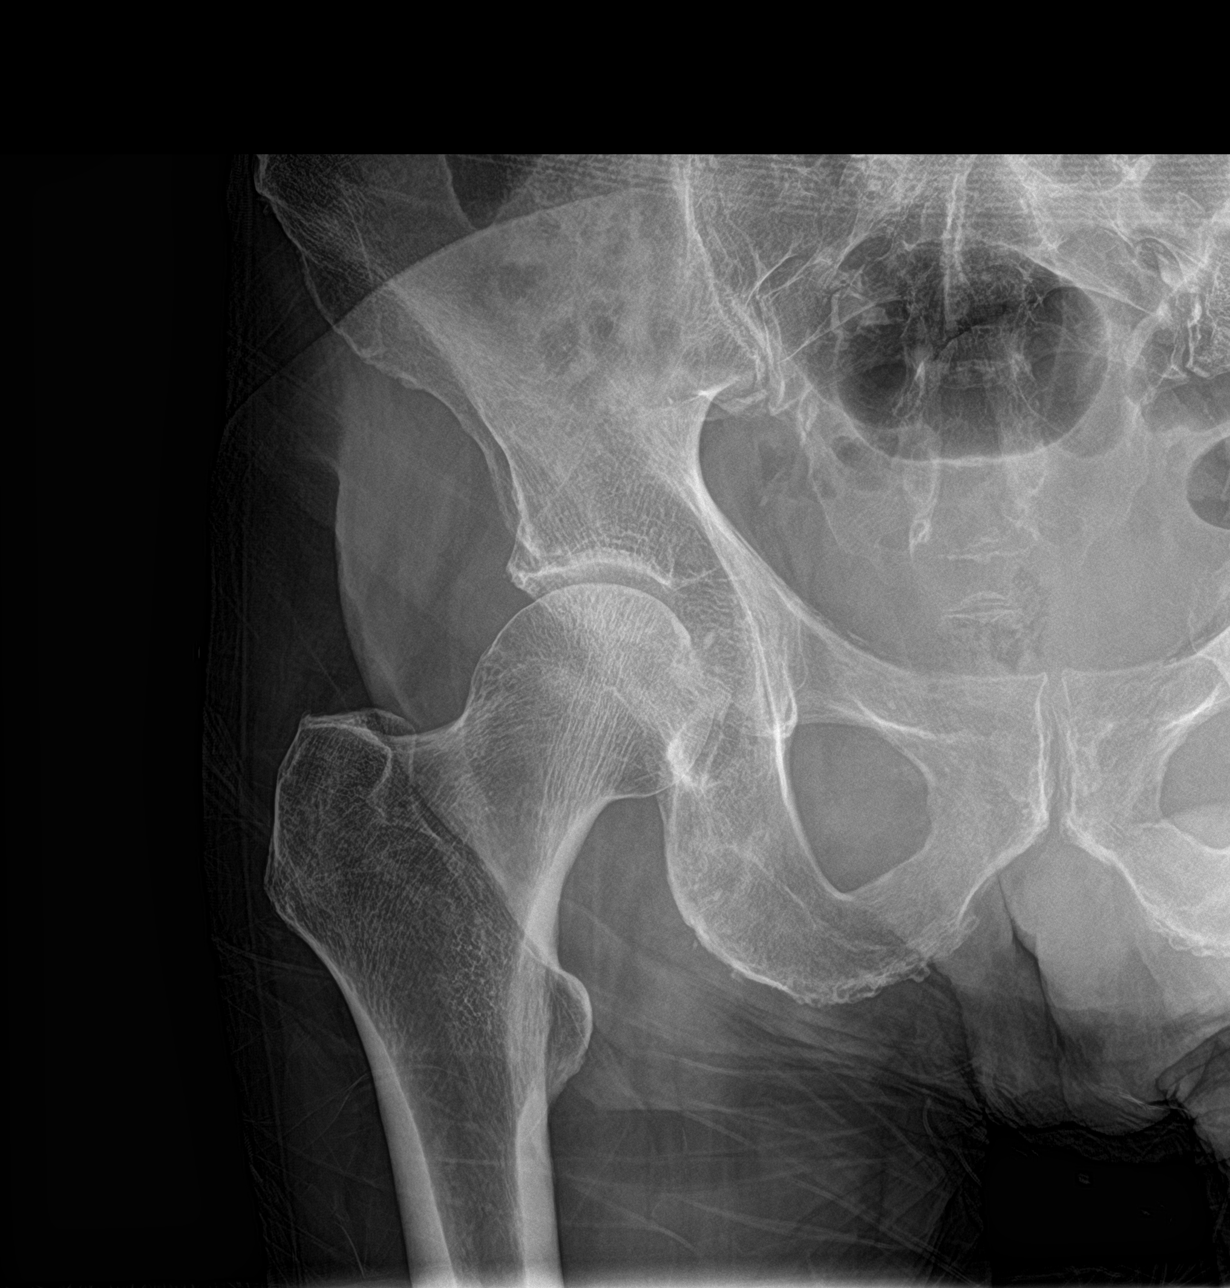

[hip lat]
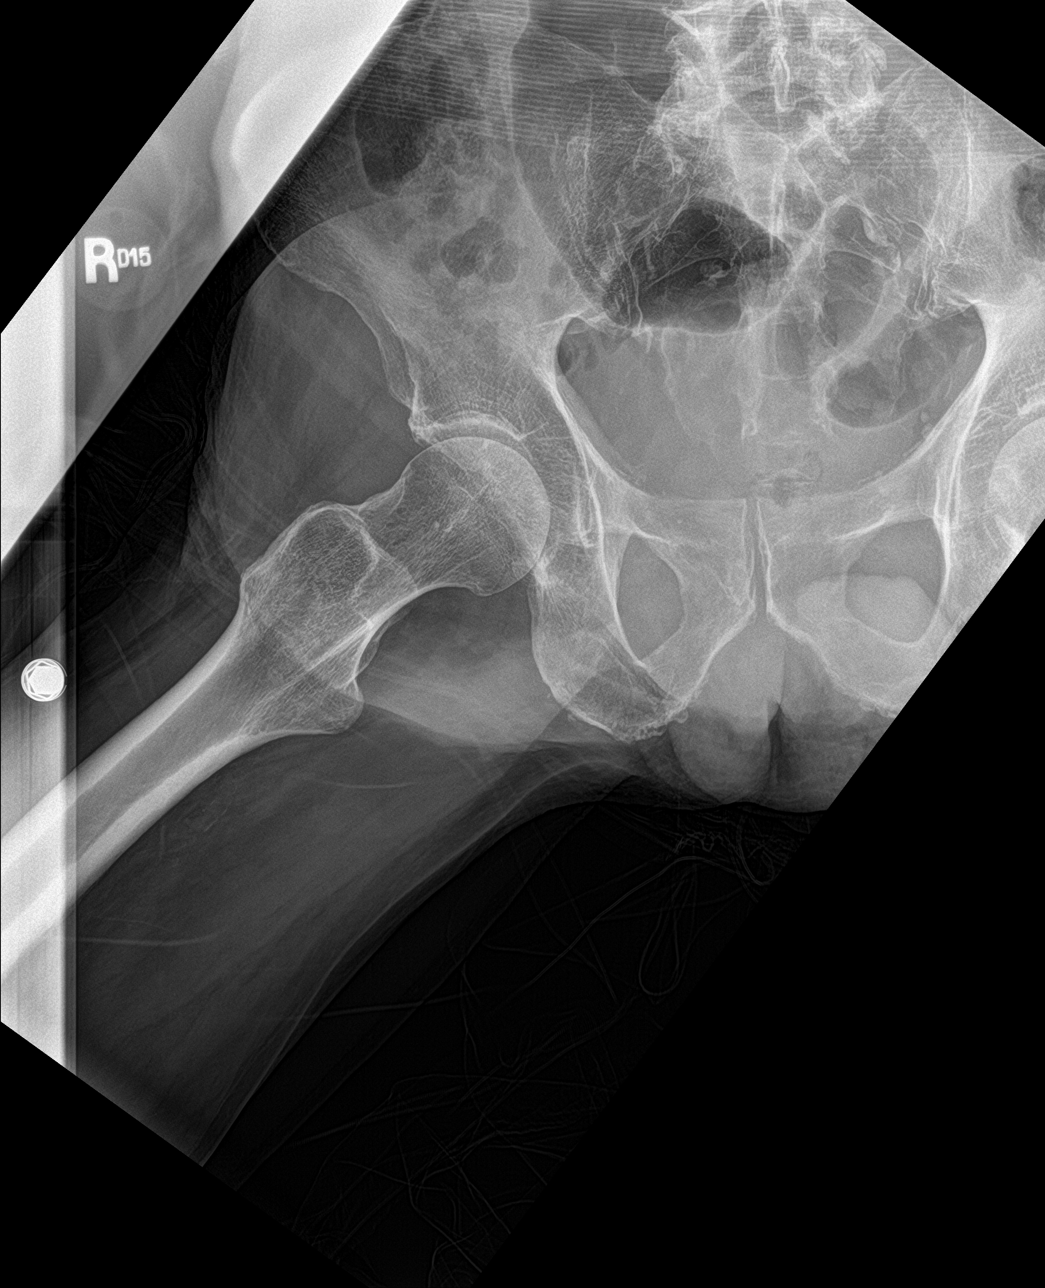

[3 of 3 positions shown; findings below may reference images not displayed]

FINDINGS: Alignment is anatomic. No acute fracture. Joint space is preserved.
No intrinsic osseous lesion.
IMPRESSION: Negative.

## 2020-05-21 IMAGING — MR MR HIP*R* W/O CM
5 series · 36 of 40 positions shown · non-contrast
Comparison: Radiograph same day

CLINICAL DATA: Fall, recent hip trauma, question of injury

EXAM:
MR OF THE RIGHT HIP WITHOUT CONTRAST
TECHNIQUE: Multiplanar, multisequence MR imaging was performed. No intravenous
contrast was administered.

[Series 8: T1 · coronal · right · 4.0mm · 0.85mm/px · 7 of 40 slices shown]
[im 1/40]
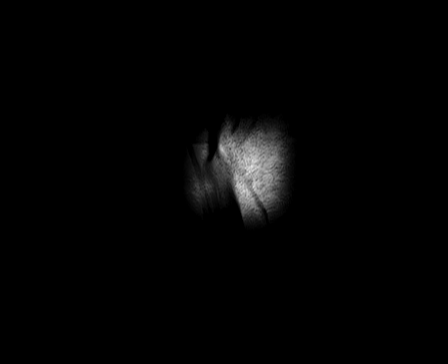
[im 5/40]
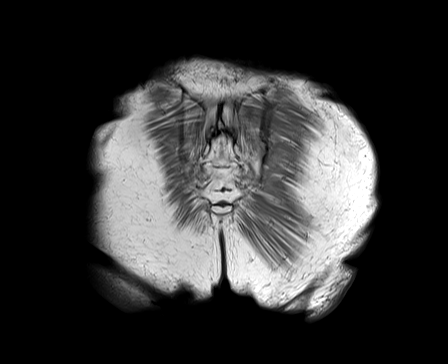
[im 10/40]
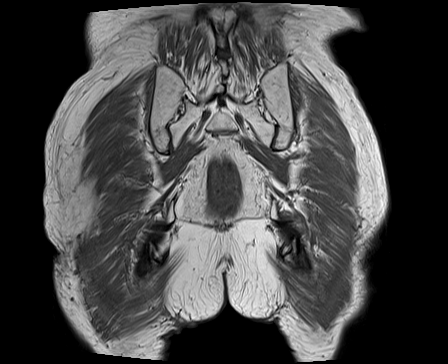
[im 15/40]
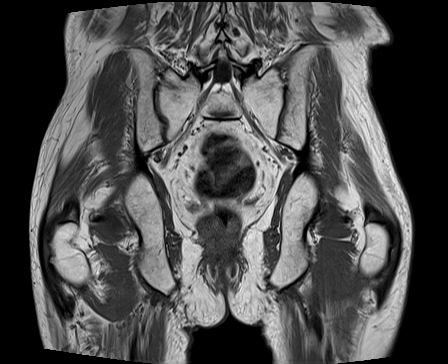
[im 25/40]
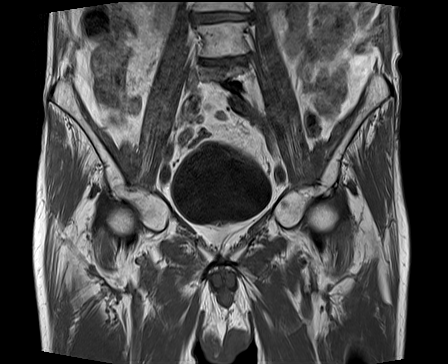
[im 30/40]
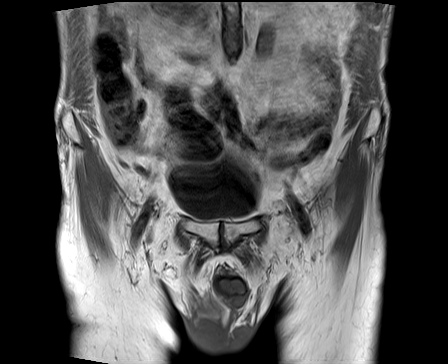
[im 35/40]
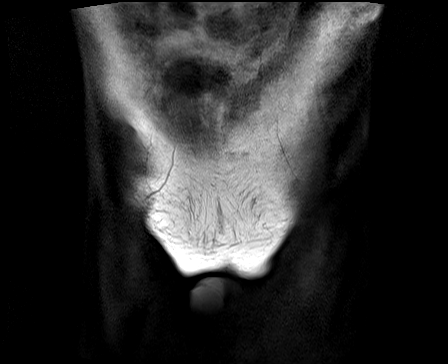

[Series 9: T2 fat-sat · coronal · right · 4.0mm · 1.03mm/px · 8 of 40 slices shown (1 of 2)]
[im 1/40]
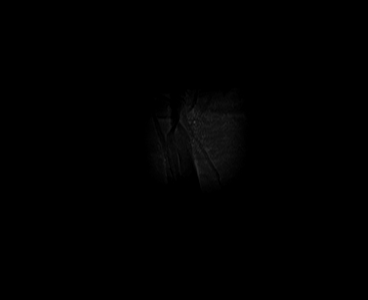
[im 5/40]
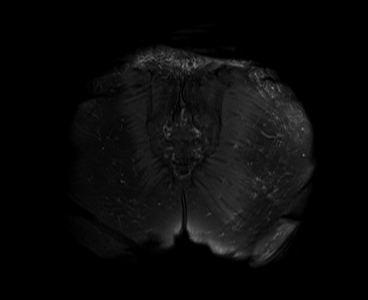
[im 14/40]
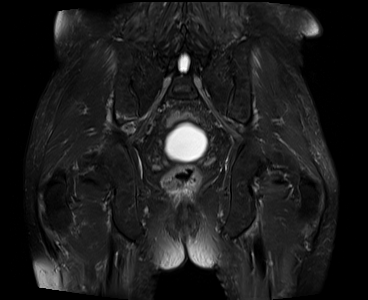
[im 18/40]
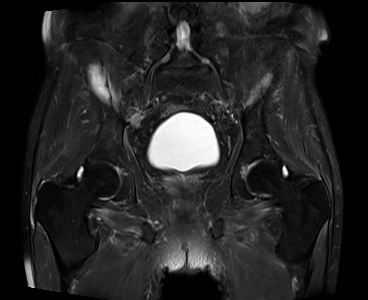
[im 22/40]
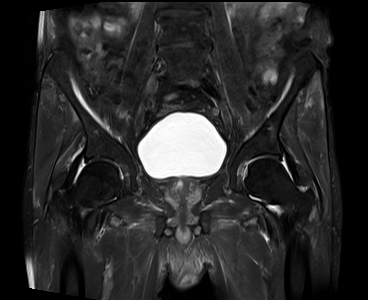
[im 27/40]
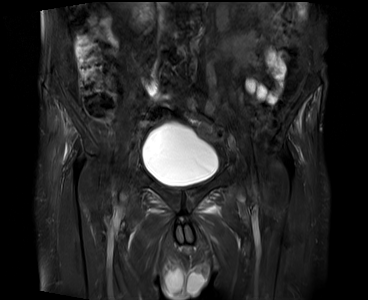
[im 35/40]
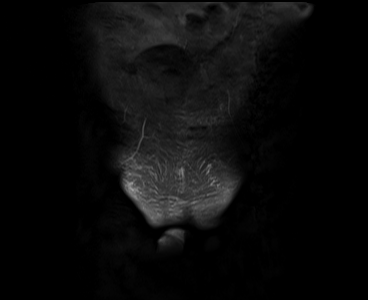
[im 40/40]
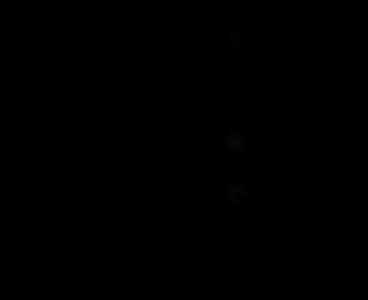

[Series 10: T2 fat-sat · axial · right · 4.0mm · 0.35mm/px · z∈[-86,+59]mm · 7 of 30 slices shown (2 of 2)]
[im 1/30]
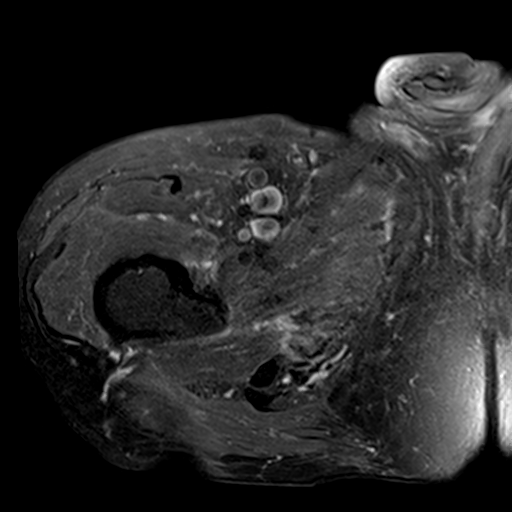
[im 5/30]
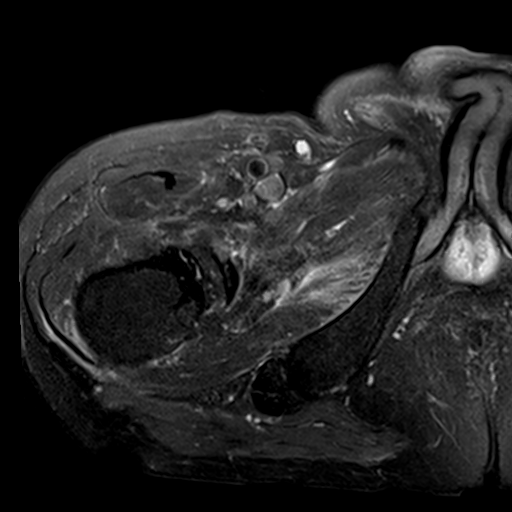
[im 10/30]
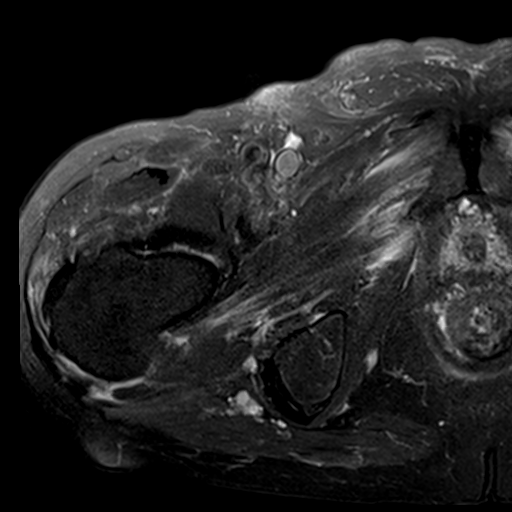
[im 15/30]
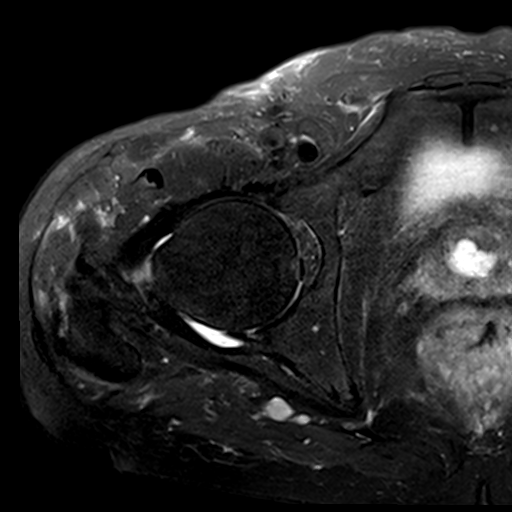
[im 20/30]
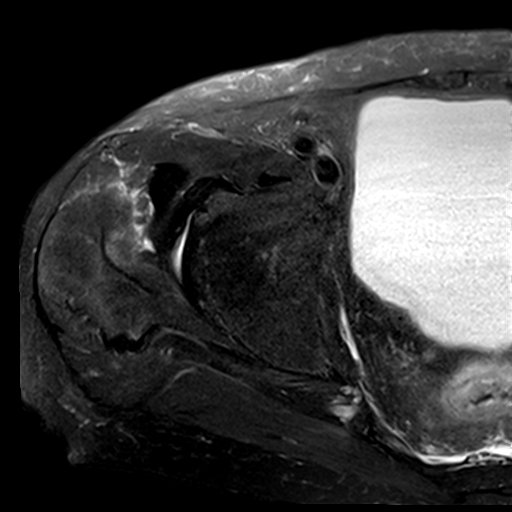
[im 25/30]
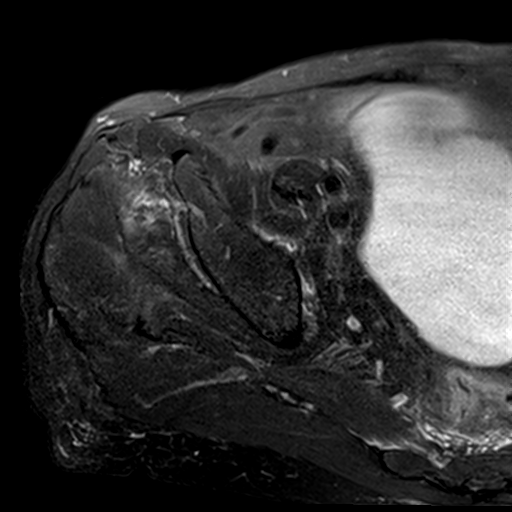
[im 30/30]
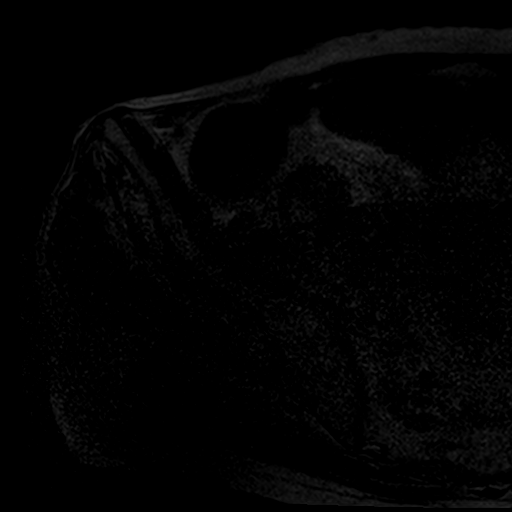

[Series 13: PD fat-sat · sagittal · right · 4.0mm · 0.70mm/px · 7 of 29 slices shown (1 of 2)]
[im 1/29]
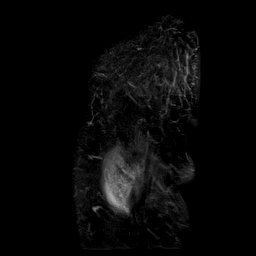
[im 5/29]
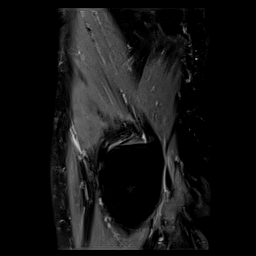
[im 10/29]
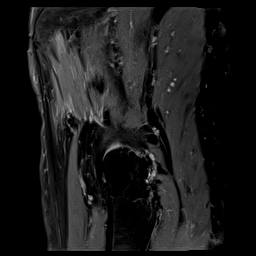
[im 15/29]
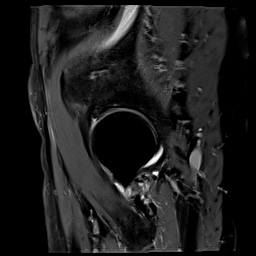
[im 19/29]
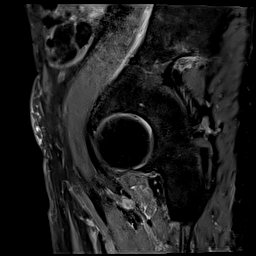
[im 24/29]
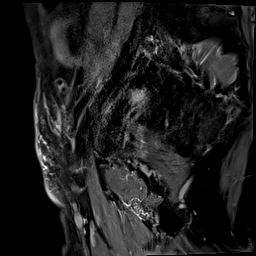
[im 29/29]
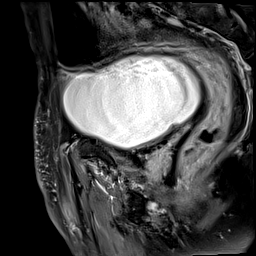

[Series 14: PD fat-sat · coronal · right · 4.0mm · 0.70mm/px · 7 of 29 slices shown (2 of 2)]
[im 1/29]
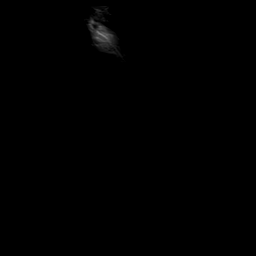
[im 5/29]
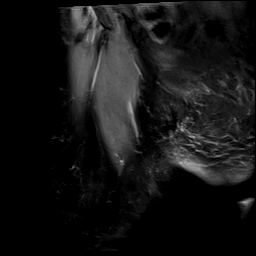
[im 10/29]
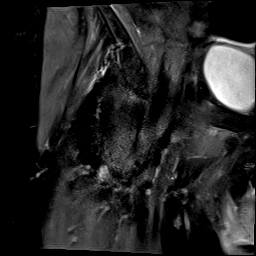
[im 15/29]
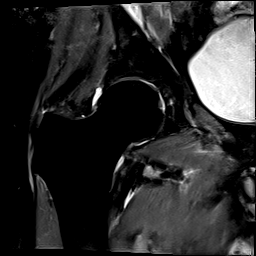
[im 19/29]
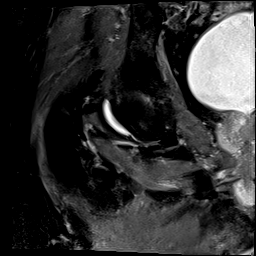
[im 24/29]
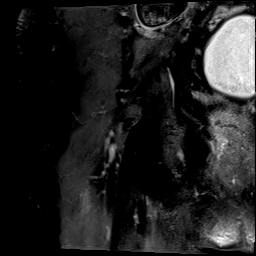
[im 29/29]
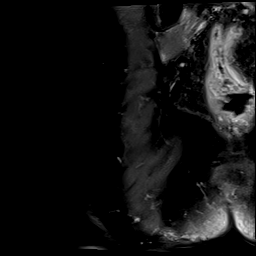

[36 of 40 positions shown; findings below may reference images not displayed]

FINDINGS: Bones: There is no evidence of acute fracture, dislocation or
avascular necrosis. The femoral head is normal in shape,
configuration and sphericity. No osseous bump at the femoral head
neck junction. The visualized bony pelvis appears normal. The
visualized sacroiliac joints and symphysis pubis appear normal.

Articular cartilage and labrum

Articular cartilage: There is mild superior joint space loss with
chondral thinning. The ligamentum teres is intact

Labrum: There is diffuse labral degeneration.

Joint or bursal effusion

Joint effusion: There is a hip joint effusion.

Bursae: There is a small amount bursal fluid.

Muscles and tendons

Muscles and tendons: Increased feathery signal seen within the
adductor musculature. Increased intrasubstance signal seen within
the gluteal tendons however they are the hamstrings tendon is.
Increased signal seen within the piriformis musculature.

Other findings

Miscellaneous: The visualized internal pelvic contents appear
unremarkable.
IMPRESSION: No acute osseous abnormality.

Moderate femoroacetabular joint osteoarthritis with superior labral
degeneration.

Adductor and piriformis muscular strain.

Mild insertional gluteal tendinosis and trochanteric bursitis

## 2020-05-21 MED ORDER — PREDNISONE 20 MG PO TABS
60.0000 mg | ORAL_TABLET | Freq: Once | ORAL | Status: AC
  Administered 2020-05-21: 60 mg via ORAL
  Filled 2020-05-21: qty 3

## 2020-05-21 MED ORDER — OXYCODONE-ACETAMINOPHEN 5-325 MG PO TABS
1.0000 | ORAL_TABLET | Freq: Once | ORAL | Status: AC
  Administered 2020-05-21: 1 via ORAL
  Filled 2020-05-21: qty 1

## 2020-05-21 MED ORDER — HYDROCODONE-ACETAMINOPHEN 5-325 MG PO TABS: 1.0000 | ORAL_TABLET | ORAL | 0 refills | Status: DC | PRN

## 2020-05-21 MED ORDER — PREDNISONE 50 MG PO TABS: 50.0000 mg | ORAL_TABLET | Freq: Every day | ORAL | 0 refills | Status: DC

## 2020-05-21 NOTE — ED Triage Notes (Signed)
Pt wife, Wolf Boulay [phone]

## 2020-05-21 NOTE — ED Triage Notes (Signed)
Pt in via EMS from home with c/o fall Friday. Pt reports increase in pain to right hip. EMS noted crepitus on palpation. Pt is on O2 at all times 3l.

## 2020-05-21 NOTE — ED Provider Notes (Signed)
[facility] Emergency Department Provider Note  ____________________________________________  Time seen: Approximately 9:15 PM  I have reviewed the triage vital signs and the nursing notes.   HISTORY  Chief Complaint Fall and Hip Pain    HPI Eric Li is a 85 y.o. male who presents the emergency department complaining of right hip pain.  Patient states that he sustained a fall  at home.  Patient has MS, states that his legs will "give out" from underneath him.  Patient states that he was in the middle of a sponge bath, was standing and lost his balance/one of his legs gave out from underneath him.  Patient fell, he did hit his head but did not lose consciousness.  Initially patient had mild pain to the right hip but was ambulatory.  Patient reports that pain has increased and he is concerned that he may have fractured his hip.  Patient denies any headache, vision changes.  There was no loss of consciousness at the time of injury or subsequently.  Patient has a history of CAD, CHF, COPD, hypertension, exertional shortness of breath, carotid artery stenosis, peripheral artery disease, MS.  Patient denies any headache, visual changes, neck pain or stiffness, chest pain, shortness of breath, abdominal pain, nausea vomiting.  No urinary symptoms.        Past Medical History:  Diagnosis Date  . Bilateral hearing loss 03/03/2016  . CAD (coronary artery disease) 02/24/2014  . CHF (congestive heart failure) ([place])   . COPD, moderate ([place]) 08/29/2015  . Hemorrhoids 02/24/2014  . HTN (hypertension) 02/24/2014  . Hyperlipidemia, unspecified 02/24/2014  . Nephrolithiasis   . SOB (shortness of breath) on exertion 03/27/2015  . Statin intolerance 03/30/2015    Patient Active Problem List   Diagnosis Date Noted  . Chest pain 03/25/2019  . Elevated PSA 03/18/2019  . Atrial flutter ([place]) 11/08/2018  . Memory problem 09/09/2018  . Psychophysiologic insomnia 06/22/2018  . Carotid  stenosis 04/02/2018  . PAD (peripheral artery disease) ([place]) 04/02/2018  . Syncope 03/15/2018  . Bilateral hearing loss 03/03/2016  . COPD, moderate ([place]) 08/29/2015  . Statin intolerance 03/30/2015  . SOB (shortness of breath) on exertion 03/27/2015  . CAD (coronary artery disease) 02/24/2014  . Hemorrhoids 02/24/2014  . HTN (hypertension) 02/24/2014  . Hyperlipidemia, unspecified 02/24/2014    Past Surgical History:  Procedure Laterality Date  . CHOLECYSTECTOMY    . TONSILLECTOMY      Prior to Admission medications   Medication Sig Start Date End Date Taking? Authorizing Provider  ADVAIR DISKUS 250-50 MCG/DOSE AEPB  07/21/19   [provider]  albuterol (PROVENTIL) (2.5 MG/3ML) 0.083% nebulizer solution Take 2.5 mg by nebulization every 6 (six) hours as needed for wheezing or shortness of breath.    [provider]  albuterol (PROVENTIL) (2.5 MG/3ML) 0.083% nebulizer solution USE 1 VIAL IN NEBULIZER 4 TIMES DAILY 07/05/19   [provider]  apixaban (ELIQUIS) 2.5 MG TABS tablet Take 2.5 mg by mouth 2 (two) times daily.    [provider]  calcium carbonate (TUMS - DOSED IN MG ELEMENTAL CALCIUM) 500 MG chewable tablet Chew 1 tablet by mouth daily as needed for indigestion or heartburn.    [provider]  clopidogrel (PLAVIX) 75 MG tablet Take 1 tablet (75 mg total) by mouth daily. 04/01/18   Schnier, Dolores Lory, MD  diltiazem (CARDIZEM CD) 240 MG 24 hr capsule Take 240 mg by mouth daily.    [provider]  diltiazem (  TIAZAC) 240 MG 24 hr capsule Take by mouth. 11/18/18 11/18/19  [provider]  escitalopram (LEXAPRO) 5 MG tablet Take 5 mg by mouth daily. 03/18/19   [provider]  escitalopram (LEXAPRO) 5 MG tablet Take by mouth. 10/05/19 04/02/20  [provider]  ezetimibe (ZETIA) 10 MG tablet Take 10 mg by mouth daily.     [provider]  FLUAD QUADRIVALENT 0.5 ML injection  06/22/19   [provider]  fluticasone (FLONASE) 50 MCG/ACT nasal spray Place 2 sprays into both nostrils daily.    [provider]  Fluticasone-Salmeterol (ADVAIR DISKUS) 250-50 MCG/DOSE AEPB INHALE 1 INHALATION INTO THE LUNGS EVERY 12 (TWELVE) HOURS 07/21/19   [provider]  HYDROcodone-acetaminophen (NORCO/VICODIN) 5-325 MG tablet Take 1 tablet by mouth every 4 (four) hours as needed for moderate pain. 05/21/20   Hollace Michelli, Charline Bills, PA-C  ipratropium (ATROVENT) 0.03 % nasal spray Place 2 sprays into both nostrils 3 (three) times daily as needed for rhinitis (runny nose).    [provider]  metoprolol succinate (TOPROL-XL) 50 MG 24 hr tablet Take 50 mg by mouth daily.     [provider]  mirtazapine (REMERON) 7.5 MG tablet Take by mouth. 07/22/19 07/21/20  [provider]  mometasone (ELOCON) 0.1 % lotion Apply 4 drops topically at bedtime as needed (itchy dry skin in the ears).    [provider]  Multiple Vitamin (MULTIVITAMIN WITH MINERALS) TABS tablet Take 1 tablet by mouth daily.    [provider]  nitroGLYCERIN (NITROSTAT) 0.4 MG SL tablet Place 0.4 mg under the tongue every 5 (five) minutes as needed for chest pain.    [provider]  nitroGLYCERIN (NITROSTAT) 0.4 MG SL tablet Place under the tongue. 05/19/18   [provider]  pantoprazole (PROTONIX) 40 MG tablet Take 1 tablet (40 mg total) by mouth 2 (two) times daily. 03/26/19   Dustin Flock, MD  pantoprazole (PROTONIX) 40 MG tablet Take by mouth. 09/21/19   [provider]  predniSONE (DELTASONE) 50 MG tablet Take 1 tablet (50 mg total) by mouth daily with breakfast. 05/21/20   Jason Hauge, Charline Bills, PA-C  psyllium (HYDROCIL/METAMUCIL) 95 % PACK Take 1 packet by mouth daily as needed for mild constipation or moderate constipation.    [provider]  tiZANidine (ZANAFLEX) 4 MG tablet  07/22/19   [provider]  traZODone (DESYREL) 50  MG tablet  08/17/19   [provider]  zolpidem (AMBIEN) 10 MG tablet Take 10 mg by mouth at bedtime as needed for sleep.  10/15/16   [provider]  zolpidem (AMBIEN) 5 MG tablet  09/03/19   [provider]  zolpidem (AMBIEN) 5 MG tablet TAKE 1 TABLET BY MOUTH EVERY DAY AT BEDTIME AS NEEDED FOR SLEEP 07/04/19   [provider]    Allergies Simvastatin  Family History  Problem Relation Age of Onset  . Bladder Cancer Neg Hx   . Prolactinoma Neg Hx   . Prostate cancer Neg Hx   . Kidney cancer Neg Hx     Social History Social History   Tobacco Use  . Smoking status: Former Research scientist (life sciences)  . Smokeless tobacco: Never Used  Substance Use Topics  . Alcohol use: No  . Drug use: No     Review of Systems  Constitutional: No fever/chills Eyes: No visual changes. No discharge ENT: No upper respiratory complaints. Cardiovascular: no chest pain. Respiratory: no cough. No SOB. Gastrointestinal: No abdominal pain.  No nausea, no vomiting.  No diarrhea.  No constipation. Genitourinary: Negative for dysuria. No hematuria Musculoskeletal: Positive for right hip pain Skin: Negative for rash, abrasions, lacerations, ecchymosis. Neurological: Negative for headaches, focal weakness or numbness. 10-point ROS otherwise negative.  ____________________________________________   PHYSICAL EXAM:  VITAL SIGNS: ED Triage Vitals  Enc Vitals Group     BP 05/21/20 1149 127/71     Pulse Rate 05/21/20 1149 75     Resp 05/21/20 1149 14     Temp 05/21/20 1149 98.6 F (37 C)     Temp Source 05/21/20 1149 Oral     SpO2 05/21/20 1149 96 %     Weight 05/21/20 1150 118 lb (53.5 kg)     Height 05/21/20 1150 5\' 5"  (1.651 m)     Head Circumference --      Peak Flow --      Pain Score 05/21/20 1150 0     Pain Loc --      Pain Edu? --      Excl. in [place]? --      Constitutional: Alert and oriented. Well appearing and in no acute distress. Eyes: Conjunctivae are normal.  PERRL. EOMI. Head: Atraumatic. ENT:      Ears:       Nose: No congestion/rhinnorhea.      Mouth/Throat: Mucous membranes are moist.  Neck: No stridor.  No cervical spine tenderness to palpation.  Cardiovascular: Normal rate, regular rhythm. Normal S1 and S2.  Good peripheral circulation. Respiratory: Normal respiratory effort without tachypnea or retractions. Lungs CTAB. Good air entry to the bases with no decreased or absent breath sounds. Gastrointestinal: Bowel sounds 4 quadrants. Soft and nontender to palpation. No guarding or rigidity. No palpable masses. No distention. No CVA tenderness. Musculoskeletal: Full range of motion to all extremities. No gross deformities appreciated.  Visualization of the right hip reveals mild ecchymosis but no deformity.  No shortening or rotation of the right lower extremity.  Patient has good range of motion with coaxing.  Patient is very tender to palpation diffusely along the anterior and lateral aspect of the hip.  No palpable abnormality.  Examination of lumbar spine, right knee is unremarkable.  Dorsalis pedis pulse and sensation intact distally. Neurologic:  Normal speech and language. No gross focal neurologic deficits are appreciated.  Skin:  Skin is warm, dry and intact. No rash noted. Psychiatric: Mood and affect are normal. Speech and behavior are normal. Patient exhibits appropriate insight and judgement.   ____________________________________________   LABS (all labs ordered are listed, but only abnormal results are displayed)  Labs Reviewed - No data to display ____________________________________________  EKG   ____________________________________________  RADIOLOGY I personally viewed and evaluated these images as part of my medical decision making, as well as reviewing the written report by the radiologist.  MR HIP RIGHT WO CONTRAST  Result Date: 05/21/2020 CLINICAL DATA:  Fall, recent hip trauma, question of injury EXAM: MR OF  THE RIGHT HIP WITHOUT CONTRAST TECHNIQUE: Multiplanar, multisequence MR imaging was performed. No intravenous contrast was administered. COMPARISON:  Radiograph same day FINDINGS: Bones: There is no evidence of acute fracture, dislocation or avascular necrosis. The femoral head is normal in shape, configuration and sphericity. No osseous bump at the femoral head neck junction. The visualized bony pelvis appears normal. The visualized sacroiliac joints and symphysis pubis appear normal. Articular cartilage and labrum Articular cartilage: There is mild superior joint space loss with chondral thinning. The ligamentum teres is intact Labrum: There is  diffuse labral degeneration. Joint or bursal effusion Joint effusion: There is a hip joint effusion. Bursae: There is a small amount bursal fluid. Muscles and tendons Muscles and tendons: Increased feathery signal seen within the adductor musculature. Increased intrasubstance signal seen within the gluteal tendons however they are the hamstrings tendon is. Increased signal seen within the piriformis musculature. Other findings Miscellaneous: The visualized internal pelvic contents appear unremarkable. IMPRESSION: No acute osseous abnormality. 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 Adductor and piriformis muscular strain. Mild insertional gluteal tendinosis and trochanteric bursitis Electronically Signed   By: Prudencio Pair M.D.   On: 05/21/2020 19:49   DG Hip Unilat  With Pelvis 2-3 Views Right  Result Date: 05/21/2020 CLINICAL DATA:  Fall few days prior with pain EXAM: DG HIP (WITH OR WITHOUT PELVIS) 2-3V RIGHT COMPARISON:  None. FINDINGS: Alignment is anatomic. No acute fracture. Joint space is preserved. No intrinsic osseous lesion. IMPRESSION: Negative. Electronically Signed   By: Macy Mis M.D.   On: 05/21/2020 12:44    ____________________________________________    PROCEDURES  Procedure(s) performed:     Procedures    Medications  oxyCODONE-acetaminophen (PERCOCET/ROXICET) 5-325 MG per tablet 1 tablet (1 tablet Oral Given 05/21/20 2148)  predniSONE (DELTASONE) tablet 60 mg (60 mg Oral Given 05/21/20 2148)     ____________________________________________   INITIAL IMPRESSION / ASSESSMENT AND PLAN / ED COURSE  Pertinent labs & imaging results that were available during my care of the patient were reviewed by me and considered in my medical decision making (see chart for details).  Review of the [place] CSRS was performed in accordance of the [place] prior to dispensing any controlled drugs.           Patient's diagnosis is consistent with fall, hip contusion.  Patient presented to emergency department complaining of increasing right hip pain after a fall.  Patient states that he has MS, one of his legs gave out from underneath him and he fell onto his right hip.  Fall occurred 3 days ago.  He did hit his head but did not lose consciousness.  He has had no headache or other complaints.  Patient was concerned that he may have fractured his hip as he was nonweightbearing at home.  Initial x-ray revealed no fractures, follow-up MRI revealed bruising muscle strain but no fracture.  I discussed further imaging to include CT of head and neck, basic labs and urinalysis.  The patient and his wife both would like to be discharged.  I have a low suspicion for TBI/traumatic hemorrhage, or other acute abnormality given weakness, fall and hitting his head I did recommend same.  Patient and his wife would like to be discharged at this time as their only concern was the hip.  Exam was otherwise reassuring.  Imaging revealed no fractures.  I discussed MRI results with the patient and his wife.  I will discharge the patient with pain medication, short course of steroid for symptom improvement.  I discussed and home health care to help care for the patient at home, the patient and his wife would prefer not to have  any additional people in their home given the COVID-19 pandemic.  They are aware that there are other resources available as needed.  Patient's daughter is a retired Marine scientist and can help as needed.  At this time I do feel that the patient is stable for discharge.  Again I recommended further work-up but they would like to proceed with discharge.  I  do feel this is reasonable and I will discharge the patient at this time.  Return precautions are discussed with the patient and his wife.  Follow-up primary care orthopedics as needed. Patient is given ED precautions to return to the ED for any worsening or new symptoms.     ____________________________________________  FINAL CLINICAL IMPRESSION(S) / ED DIAGNOSES  Final diagnoses:  Fall, initial encounter  Contusion of right hip, initial encounter      NEW MEDICATIONS STARTED DURING THIS VISIT:  ED Discharge Orders         Ordered    HYDROcodone-acetaminophen (NORCO/VICODIN) 5-325 MG tablet  Every 4 hours PRN        05/21/20 2201    predniSONE (DELTASONE) 50 MG tablet  Daily with breakfast        05/21/20 2201              This chart was dictated using voice recognition software/Dragon. Despite best efforts to proofread, errors can occur which can change the meaning. Any change was purely unintentional.    Darletta Moll, PA-C 05/21/20 2201    Nena Polio, MD 05/21/20 [phone]

## 2020-05-21 NOTE — ED Triage Notes (Signed)
patinet on strecher in triage area.  Says he had a fall a couple days ago.  Says he has this thing where he passes out.  Says right hip pain and  He is here for an xray.  Says no pain now, but if he tries to walk on it he has pain.  And has been unable to bear wt.

## 2020-05-21 NOTE — ED Notes (Signed)
Urine sent to the lab

## 2020-05-21 NOTE — ED Notes (Signed)
Pt gave verbal consent for dc ? ? ? ?

## 2020-05-21 NOTE — ED Notes (Signed)
Patient transported to MRI 

## 2020-05-21 NOTE — ED Notes (Signed)
Pt states that he had a fall a few days ago and that he has severe pain when he walks.

## 2020-05-26 ENCOUNTER — Ambulatory Visit
Admission: EM | Admit: 2020-05-26 | Discharge: 2020-05-26 | Disposition: A | Discharge status: Home / Self Care | Payer: PPO | Attending: Physician Assistant | Admitting: Physician Assistant

## 2020-05-26 ENCOUNTER — Encounter: Payer: Self-pay | Admitting: Emergency Medicine

## 2020-05-26 ENCOUNTER — Other Ambulatory Visit: Payer: Self-pay

## 2020-05-26 DIAGNOSIS — N390 Urinary tract infection, site not specified: Secondary | ICD-10-CM | POA: Diagnosis not present

## 2020-05-26 DIAGNOSIS — R410 Disorientation, unspecified: Secondary | ICD-10-CM | POA: Diagnosis not present

## 2020-05-26 LAB — COMPREHENSIVE METABOLIC PANEL
ALT: 73 U/L — ABNORMAL HIGH (ref 0–44)
AST: 65 U/L — ABNORMAL HIGH (ref 15–41)
Albumin: 3.4 g/dL — ABNORMAL LOW (ref 3.5–5.0)
Alkaline Phosphatase: 103 U/L (ref 38–126)
Anion gap: 11 (ref 5–15)
BUN: 17 mg/dL (ref 8–23)
CO2: 21 mmol/L — ABNORMAL LOW (ref 22–32)
Calcium: 8.7 mg/dL — ABNORMAL LOW (ref 8.9–10.3)
Chloride: 104 mmol/L (ref 98–111)
Creatinine, Ser: 0.75 mg/dL (ref 0.61–1.24)
GFR calc Af Amer: >60 mL/min (ref >60)
GFR calc non Af Amer: >60 mL/min (ref >60)
Glucose, Bld: 141 mg/dL — ABNORMAL HIGH (ref 70–99)
Potassium: 3.9 mmol/L (ref 3.5–5.1)
Sodium: 136 mmol/L (ref 135–145)
Total Bilirubin: 1.2 mg/dL (ref 0.3–1.2)
Total Protein: 6.4 g/dL — ABNORMAL LOW (ref 6.5–8.1)

## 2020-05-26 LAB — URINALYSIS, COMPLETE (UACMP) WITH MICROSCOPIC
Bilirubin Urine: NEGATIVE
Glucose, UA: NEGATIVE mg/dL
Nitrite: NEGATIVE
Protein, ur: NEGATIVE mg/dL
Specific Gravity, Urine: 1.02 (ref 1.005–1.030)
WBC, UA: >50 WBC/hpf (ref 0–5)
pH: 5.5 (ref 5.0–8.0)

## 2020-05-26 LAB — CBC WITH DIFFERENTIAL/PLATELET
Abs Immature Granulocytes: 0.17 10*3/uL — ABNORMAL HIGH (ref 0.00–0.07)
Basophils Absolute: 0 10*3/uL (ref 0.0–0.1)
Basophils Relative: 0 %
Eosinophils Absolute: 0 10*3/uL (ref 0.0–0.5)
Eosinophils Relative: 0 %
HCT: 45 % (ref 39.0–52.0)
Hemoglobin: 14.7 g/dL (ref 13.0–17.0)
Immature Granulocytes: 1 %
Lymphocytes Relative: 3 %
Lymphs Abs: 0.5 10*3/uL — ABNORMAL LOW (ref 0.7–4.0)
MCH: 29.6 pg (ref 26.0–34.0)
MCHC: 32.7 g/dL (ref 30.0–36.0)
MCV: 90.5 fL (ref 80.0–100.0)
Monocytes Absolute: 1.3 10*3/uL — ABNORMAL HIGH (ref 0.1–1.0)
Monocytes Relative: 7 %
Neutro Abs: 17.6 10*3/uL — ABNORMAL HIGH (ref 1.7–7.7)
Neutrophils Relative %: 89 %
Platelets: 189 10*3/uL (ref 150–400)
RBC: 4.97 MIL/uL (ref 4.22–5.81)
RDW: 14.8 % (ref 11.5–15.5)
WBC: 19.5 10*3/uL — ABNORMAL HIGH (ref 4.0–10.5)
nRBC: 0 % (ref 0.0–0.2)

## 2020-05-26 MED ORDER — CIPROFLOXACIN HCL 250 MG PO TABS
250.0000 mg | ORAL_TABLET | Freq: Two times a day (BID) | ORAL | 0 refills | Status: DC
Start: 2020-05-26 — End: 2020-06-21

## 2020-05-26 MED ORDER — TRAMADOL HCL 50 MG PO TABS: 50.0000 mg | ORAL_TABLET | Freq: Four times a day (QID) | ORAL | 0 refills | Status: DC | PRN

## 2020-05-26 MED ORDER — CEFTRIAXONE SODIUM 1 G IJ SOLR
1.0000 g | Freq: Once | INTRAMUSCULAR | Status: AC
  Administered 2020-05-26: 1 g via INTRAMUSCULAR

## 2020-05-26 NOTE — Discharge Instructions (Signed)
Your urine shows signs of infection, we are sending a culture. We will call your wife or daughter with blood work results when is back.

## 2020-05-26 NOTE — ED Provider Notes (Addendum)
[facility]    CSN: [id number] Arrival date & time: 05/26/20  1422      History   Chief Complaint Chief Complaint  Patient presents with  . Altered Mental Status    HPI Eric Li is a 85 y.o. male who presents with his wife and daughter who are the historians. Per his wife he woke up at 1:30 am asking for crab cakes. She fed him icecream and a cookie. Then when he woke up this am said he wanted to go home, but he was home. He denies having abdominal pain or flank pain. Has not had a fever, chills or sweats. Has hx of UTI's. His wife has noticed strong urine color since he has been using the urinal in the bed. His daughter thinks he had labs done at [place] with his PCP last week. Per his daughter he seems better in regards to his confusion.   Past Medical History:  Diagnosis Date  . Bilateral hearing loss 03/03/2016  . CAD (coronary artery disease) 02/24/2014  . CHF (congestive heart failure) ([place])   . COPD, moderate ([place]) 08/29/2015  . Hemorrhoids 02/24/2014  . HTN (hypertension) 02/24/2014  . Hyperlipidemia, unspecified 02/24/2014  . Nephrolithiasis   . SOB (shortness of breath) on exertion 03/27/2015  . Statin intolerance 03/30/2015    Patient Active Problem List   Diagnosis Date Noted  . Chest pain 03/25/2019  . Elevated PSA 03/18/2019  . Atrial flutter ([place]) 11/08/2018  . Memory problem 09/09/2018  . Psychophysiologic insomnia 06/22/2018  . Carotid stenosis 04/02/2018  . PAD (peripheral artery disease) ([place]) 04/02/2018  . Syncope 03/15/2018  . Bilateral hearing loss 03/03/2016  . COPD, moderate ([place]) 08/29/2015  . Statin intolerance 03/30/2015  . SOB (shortness of breath) on exertion 03/27/2015  . CAD (coronary artery disease) 02/24/2014  . Hemorrhoids 02/24/2014  . HTN (hypertension) 02/24/2014  . Hyperlipidemia, unspecified 02/24/2014    Past Surgical History:  Procedure Laterality Date  . CHOLECYSTECTOMY    . TONSILLECTOMY       Home Medications     Prior to Admission medications   Medication Sig Start Date End Date Taking? Authorizing Provider  albuterol (PROVENTIL) (2.5 MG/3ML) 0.083% nebulizer solution Take 2.5 mg by nebulization every 6 (six) hours as needed for wheezing or shortness of breath.   Yes [provider]  albuterol (VENTOLIN HFA) 108 (90 Base) MCG/ACT inhaler Inhale 1-2 puffs into the lungs every 6 (six) hours as needed for wheezing or shortness of breath.   Yes [provider]  apixaban (ELIQUIS) 2.5 MG TABS tablet Take 2.5 mg by mouth 2 (two) times daily.   Yes [provider]  diltiazem (TIAZAC) 360 MG 24 hr capsule Take 360 mg by mouth daily.   Yes [provider]  ezetimibe (ZETIA) 10 MG tablet Take 10 mg by mouth daily.   Yes [provider]  fluticasone (FLONASE) 50 MCG/ACT nasal spray Place 2 sprays into both nostrils daily.   Yes [provider]  furosemide (LASIX) 20 MG tablet Take 10 mg by mouth daily.   Yes [provider]  ipratropium (ATROVENT) 0.03 % nasal spray Place 2 sprays into both nostrils 3 (three) times daily as needed for rhinitis (runny nose).   Yes [provider]  Methylcellulose, Laxative, (EQL FIBER THERAPY PO) Take by mouth.   Yes [provider]  metoprolol succinate (TOPROL-XL) 50 MG 24 hr tablet Take 50 mg by mouth daily.    Yes [provider]  Multiple Vitamin (MULTIVITAMIN WITH MINERALS) TABS tablet Take 1 tablet by mouth daily.   Yes [provider]  pantoprazole (PROTONIX) 40 MG tablet Take 40 mg by mouth daily.  09/21/19  Yes [provider]  predniSONE (DELTASONE) 10 MG tablet Take 10 mg by mouth daily with breakfast.   Yes [provider]  psyllium (HYDROCIL/METAMUCIL) 95 % PACK Take 1 packet by mouth daily as needed for mild constipation or moderate constipation.   Yes [provider]  tiotropium (SPIRIVA) 18 MCG inhalation capsule Place 18 mcg into inhaler and  inhale daily.   Yes [provider]  tiZANidine (ZANAFLEX) 4 MG tablet Take 4 mg by mouth at bedtime.  07/22/19  Yes [provider]  vitamin B-12 (CYANOCOBALAMIN) 1000 MCG tablet Take 1,000 mcg by mouth daily.   Yes [provider]  zolpidem (AMBIEN) 5 MG tablet TAKE 1 TABLET BY MOUTH EVERY DAY AT BEDTIME AS NEEDED FOR SLEEP 07/04/19  Yes [provider]  ADVAIR DISKUS 250-50 MCG/DOSE AEPB Inhale 1 puff into the lungs 2 (two) times daily.  07/21/19   [provider]  albuterol (PROVENTIL) (2.5 MG/3ML) 0.083% nebulizer solution USE 1 VIAL IN NEBULIZER 4 TIMES DAILY 07/05/19   [provider]  calcium carbonate (TUMS - DOSED IN MG ELEMENTAL CALCIUM) 500 MG chewable tablet Chew 1 tablet by mouth daily as needed for indigestion or heartburn.    [provider]  ciprofloxacin (CIPRO) 250 MG tablet Take 1 tablet (250 mg total) by mouth 2 (two) times daily. 05/26/20   Rodriguez-Southworth, Sunday Spillers, PA-C  diltiazem (CARDIZEM CD) 240 MG 24 hr capsule Take 360 mg by mouth daily.     [provider]  diltiazem (TIAZAC) 240 MG 24 hr capsule Take by mouth. 11/18/18 11/18/19  [provider]  escitalopram (LEXAPRO) 5 MG tablet Take 5 mg by mouth daily. 03/18/19   [provider]  escitalopram (LEXAPRO) 5 MG tablet Take by mouth. 10/05/19 04/02/20  [provider]  Fluticasone-Salmeterol (ADVAIR DISKUS) 250-50 MCG/DOSE AEPB INHALE 1 INHALATION INTO THE LUNGS EVERY 12 (TWELVE) HOURS 07/21/19   [provider]  HYDROcodone-acetaminophen (NORCO/VICODIN) 5-325 MG tablet Take 1 tablet by mouth every 4 (four) hours as needed for moderate pain. 05/21/20   Cuthriell, Charline Bills, PA-C  nitroGLYCERIN (NITROSTAT) 0.4 MG SL tablet Place 0.4 mg under the tongue every 5 (five) minutes as needed for chest pain.    [provider]  nitroGLYCERIN (NITROSTAT) 0.4 MG SL tablet Place under the tongue. 05/19/18   [provider]  pantoprazole (PROTONIX) 40 MG tablet Take 1 tablet (40 mg total) by mouth 2 (two) times daily. 03/26/19   Dustin Flock, MD  predniSONE (DELTASONE) 50 MG tablet Take 1 tablet (50 mg total) by mouth daily with breakfast. 05/21/20   Cuthriell, Charline Bills, PA-C  traZODone (DESYREL) 50 MG tablet  08/17/19   [provider]  zolpidem (AMBIEN) 10 MG tablet Take 10 mg by mouth at bedtime as needed for sleep.  10/15/16   [provider]  zolpidem (AMBIEN) 5 MG tablet  09/03/19   [provider]  mirtazapine (REMERON) 7.5 MG tablet Take by mouth. 07/22/19 05/26/20  [provider]    Family History Family History  Problem Relation Age of Onset  . Bladder Cancer Neg Hx   . Prolactinoma Neg Hx   . Prostate cancer Neg Hx   . Kidney cancer Neg Hx     Social History Social  History   Tobacco Use  . Smoking status: Former Research scientist (life sciences)  . Smokeless tobacco: Never Used  Vaping Use  . Vaping Use: Never used  Substance Use Topics  . Alcohol use: No  . Drug use: No     Allergies   Simvastatin   Review of Systems Review of Systems  Constitutional: Positive for activity change. Negative for appetite change, chills, diaphoresis and fever.  HENT: Negative for congestion.   Respiratory: Negative for cough and shortness of breath.   Cardiovascular: Negative for chest pain.  Gastrointestinal: Negative for abdominal pain, constipation, diarrhea, nausea and vomiting.  Genitourinary: Negative for dysuria, enuresis, flank pain, frequency, hematuria and urgency.       Has had strong urine odor  Musculoskeletal: Positive for gait problem.       Has R hip contusion from a fall last week. Has been taking Tylenol since Norco was not helping  Skin: Negative for rash.  Neurological: Negative for weakness.  Psychiatric/Behavioral: Positive for confusion.     Physical Exam Triage Vital Signs ED Triage Vitals  Enc Vitals Group     BP 05/26/20 1454 113/60      Pulse Rate 05/26/20 1454 (!) 53     Resp 05/26/20 1454 16     Temp 05/26/20 1454 97.6 F (36.4 C)     Temp Source 05/26/20 1454 Oral     SpO2 05/26/20 1454 95 %     Weight 05/26/20 1451 117 lb 15.1 oz (53.5 kg)     Height --      Head Circumference --      Peak Flow --      Pain Score 05/26/20 1451 4     Pain Loc --      Pain Edu? --      Excl. in [place]? --    No data found.  Updated Vital Signs BP 113/60 (BP Location: Left Arm)   Pulse (!) 53   Temp 97.6 F (36.4 C) (Oral)   Resp 16   Wt 117 lb 15.1 oz (53.5 kg)   SpO2 95%   BMI 19.63 kg/m   Visual Acuity Right Eye Distance:   Left Eye Distance:   Bilateral Distance:    Right Eye Near:   Left Eye Near:    Bilateral Near:     Physical Exam Vitals and nursing note reviewed.  Constitutional:      General: He is not in acute distress.    Appearance: He is not ill-appearing.  HENT:     Head: Atraumatic.     Right Ear: External ear normal.     Left Ear: External ear normal.  Eyes:     General: No scleral icterus.    Conjunctiva/sclera: Conjunctivae normal.  Cardiovascular:     Rate and Rhythm: Normal rate and regular rhythm.  Pulmonary:     Effort: Pulmonary effort is normal.     Breath sounds: Normal breath sounds.  Abdominal:     General: Abdomen is flat. Bowel sounds are normal. There is no distension.     Palpations: Abdomen is soft. There is no mass.     Tenderness: There is no abdominal tenderness. There is no right CVA tenderness or left CVA tenderness.  Musculoskeletal:     Cervical back: Neck supple.  Skin:    General: Skin is warm and dry.     Findings: No rash.  Neurological:     Mental Status: He is alert.     Comments: Sits on  a wheelchair  Psychiatric:        Mood and Affect: Mood normal.        Behavior: Behavior normal.     Comments: Answers questions fine    UC Treatments / Results  Labs (all labs ordered are listed, but only abnormal results are displayed) Labs Reviewed    URINALYSIS, COMPLETE (UACMP) WITH MICROSCOPIC - Abnormal; Notable for the following components:      Result Value   APPearance HAZY (*)    Hgb urine dipstick SMALL (*)    Ketones, ur TRACE (*)    Leukocytes,Ua SMALL (*)    Bacteria, UA MANY (*)    All other components within normal limits  CBC WITH DIFFERENTIAL/PLATELET - Abnormal; Notable for the following components:   WBC 19.5 (*)    Neutro Abs 17.6 (*)    Lymphs Abs 0.5 (*)    Monocytes Absolute 1.3 (*)    Abs Immature Granulocytes 0.17 (*)    All other components within normal limits  URINE CULTURE  COMPREHENSIVE METABOLIC PANEL    EKG   Radiology No results found.  Procedures Procedures (including critical care time)  Medications Ordered in UC Medications  cefTRIAXone (ROCEPHIN) injection 1 g (has no administration in time range)    Initial Impression / Assessment and Plan / UC Course  I have reviewed the triage vital signs and the nursing notes. Pertinent labs & imaging results that were available during my care of the patient were reviewed by me and considered in my medical decision making (see chart for details). His white cell  Count is 19K and UA looks like he has a UTI.  He was given 1G Roceephin IM And sent Cipro 250 mg bid for 7 days to his pharmacy.  If he gets worse, needs to be seen.  CMP shows mild elevation of his liver enzymes, calsium and protein, but per daughter they were aware about the low protein and have been working on having him eat more protein.  I looked at care everywhere and the last labs he had a [facility] was in May this year.  Final Clinical Impressions(s) / UC Diagnoses   Final diagnoses:  Lower urinary tract infectious disease  Subacute confusional state     Discharge Instructions     Your urine shows signs of infection, we are sending a culture. We will call your wife or daughter with blood work results when is back.     ED Prescriptions    Medication Sig Dispense Auth.  Provider   ciprofloxacin (CIPRO) 250 MG tablet Take 1 tablet (250 mg total) by mouth 2 (two) times daily. 14 tablet Rodriguez-Southworth, Sunday Spillers, PA-C     PDMP not reviewed this encounter.   Shelby [place], PA-C 05/26/20 [place], [place], PA-C 05/26/20 1637

## 2020-05-26 NOTE — ED Triage Notes (Signed)
Patient's wife states that for the past couple of days he has had dark colored and strong urine odor.  She also mentions that he has seemed more confused this morning.  Patient wife states that she wanted his urine check.

## 2020-05-29 ENCOUNTER — Telehealth (HOSPITAL_COMMUNITY): Payer: Self-pay | Admitting: Emergency Medicine

## 2020-05-29 LAB — URINE CULTURE: Culture: >=100000 — AB

## 2020-05-29 MED ORDER — SULFAMETHOXAZOLE-TRIMETHOPRIM 800-160 MG PO TABS: 1.0000 | ORAL_TABLET | Freq: Two times a day (BID) | ORAL | 0 refills | Status: AC

## 2020-06-04 DIAGNOSIS — J449 Chronic obstructive pulmonary disease, unspecified: Secondary | ICD-10-CM | POA: Diagnosis not present

## 2020-06-05 DIAGNOSIS — J8410 Pulmonary fibrosis, unspecified: Secondary | ICD-10-CM | POA: Diagnosis not present

## 2020-06-05 DIAGNOSIS — J449 Chronic obstructive pulmonary disease, unspecified: Secondary | ICD-10-CM | POA: Diagnosis not present

## 2020-06-05 DIAGNOSIS — J9611 Chronic respiratory failure with hypoxia: Secondary | ICD-10-CM | POA: Diagnosis not present

## 2020-06-05 DIAGNOSIS — I10 Essential (primary) hypertension: Secondary | ICD-10-CM | POA: Diagnosis not present

## 2020-06-12 DIAGNOSIS — J449 Chronic obstructive pulmonary disease, unspecified: Secondary | ICD-10-CM | POA: Diagnosis not present

## 2020-06-12 DIAGNOSIS — R262 Difficulty in walking, not elsewhere classified: Secondary | ICD-10-CM | POA: Diagnosis not present

## 2020-06-12 DIAGNOSIS — Z23 Encounter for immunization: Secondary | ICD-10-CM | POA: Diagnosis not present

## 2020-06-12 DIAGNOSIS — E8809 Other disorders of plasma-protein metabolism, not elsewhere classified: Secondary | ICD-10-CM | POA: Diagnosis not present

## 2020-06-12 DIAGNOSIS — Z789 Other specified health status: Secondary | ICD-10-CM | POA: Diagnosis not present

## 2020-06-12 DIAGNOSIS — I480 Paroxysmal atrial fibrillation: Secondary | ICD-10-CM | POA: Diagnosis not present

## 2020-06-12 DIAGNOSIS — F411 Generalized anxiety disorder: Secondary | ICD-10-CM | POA: Diagnosis not present

## 2020-06-12 DIAGNOSIS — R413 Other amnesia: Secondary | ICD-10-CM | POA: Diagnosis not present

## 2020-06-12 DIAGNOSIS — R531 Weakness: Secondary | ICD-10-CM | POA: Diagnosis not present

## 2020-06-12 DIAGNOSIS — I700 Atherosclerosis of aorta: Secondary | ICD-10-CM | POA: Diagnosis not present

## 2020-06-12 DIAGNOSIS — J8410 Pulmonary fibrosis, unspecified: Secondary | ICD-10-CM | POA: Diagnosis not present

## 2020-06-12 DIAGNOSIS — J9611 Chronic respiratory failure with hypoxia: Secondary | ICD-10-CM | POA: Diagnosis not present

## 2020-06-21 ENCOUNTER — Emergency Department: Payer: PPO

## 2020-06-21 ENCOUNTER — Inpatient Hospital Stay
Admission: EM | Admit: 2020-06-21 | Discharge: 2020-07-23 | DRG: 689 | Disposition: E | Payer: PPO | Attending: Internal Medicine | Admitting: Internal Medicine

## 2020-06-21 ENCOUNTER — Other Ambulatory Visit: Payer: Self-pay

## 2020-06-21 DIAGNOSIS — F22 Delusional disorders: Secondary | ICD-10-CM

## 2020-06-21 DIAGNOSIS — I517 Cardiomegaly: Secondary | ICD-10-CM | POA: Diagnosis not present

## 2020-06-21 DIAGNOSIS — J9621 Acute and chronic respiratory failure with hypoxia: Secondary | ICD-10-CM | POA: Diagnosis not present

## 2020-06-21 DIAGNOSIS — K5900 Constipation, unspecified: Secondary | ICD-10-CM | POA: Diagnosis not present

## 2020-06-21 DIAGNOSIS — I4510 Unspecified right bundle-branch block: Secondary | ICD-10-CM | POA: Diagnosis present

## 2020-06-21 DIAGNOSIS — Z87891 Personal history of nicotine dependence: Secondary | ICD-10-CM | POA: Diagnosis not present

## 2020-06-21 DIAGNOSIS — B9620 Unspecified Escherichia coli [E. coli] as the cause of diseases classified elsewhere: Secondary | ICD-10-CM | POA: Diagnosis present

## 2020-06-21 DIAGNOSIS — J9601 Acute respiratory failure with hypoxia: Secondary | ICD-10-CM | POA: Diagnosis not present

## 2020-06-21 DIAGNOSIS — R410 Disorientation, unspecified: Secondary | ICD-10-CM | POA: Diagnosis not present

## 2020-06-21 DIAGNOSIS — Z66 Do not resuscitate: Secondary | ICD-10-CM | POA: Diagnosis not present

## 2020-06-21 DIAGNOSIS — R9389 Abnormal findings on diagnostic imaging of other specified body structures: Secondary | ICD-10-CM | POA: Diagnosis not present

## 2020-06-21 DIAGNOSIS — I10 Essential (primary) hypertension: Secondary | ICD-10-CM | POA: Diagnosis not present

## 2020-06-21 DIAGNOSIS — F419 Anxiety disorder, unspecified: Secondary | ICD-10-CM | POA: Diagnosis present

## 2020-06-21 DIAGNOSIS — I2510 Atherosclerotic heart disease of native coronary artery without angina pectoris: Secondary | ICD-10-CM | POA: Diagnosis not present

## 2020-06-21 DIAGNOSIS — T380X5A Adverse effect of glucocorticoids and synthetic analogues, initial encounter: Secondary | ICD-10-CM | POA: Diagnosis not present

## 2020-06-21 DIAGNOSIS — Z7189 Other specified counseling: Secondary | ICD-10-CM

## 2020-06-21 DIAGNOSIS — R0603 Acute respiratory distress: Secondary | ICD-10-CM | POA: Diagnosis not present

## 2020-06-21 DIAGNOSIS — I25118 Atherosclerotic heart disease of native coronary artery with other forms of angina pectoris: Secondary | ICD-10-CM | POA: Diagnosis not present

## 2020-06-21 DIAGNOSIS — R4189 Other symptoms and signs involving cognitive functions and awareness: Secondary | ICD-10-CM | POA: Diagnosis present

## 2020-06-21 DIAGNOSIS — Z7901 Long term (current) use of anticoagulants: Secondary | ICD-10-CM | POA: Diagnosis not present

## 2020-06-21 DIAGNOSIS — R0902 Hypoxemia: Secondary | ICD-10-CM | POA: Diagnosis not present

## 2020-06-21 DIAGNOSIS — Z9049 Acquired absence of other specified parts of digestive tract: Secondary | ICD-10-CM | POA: Diagnosis not present

## 2020-06-21 DIAGNOSIS — N281 Cyst of kidney, acquired: Secondary | ICD-10-CM | POA: Diagnosis not present

## 2020-06-21 DIAGNOSIS — R7303 Prediabetes: Secondary | ICD-10-CM | POA: Diagnosis present

## 2020-06-21 DIAGNOSIS — M47816 Spondylosis without myelopathy or radiculopathy, lumbar region: Secondary | ICD-10-CM | POA: Diagnosis not present

## 2020-06-21 DIAGNOSIS — R1310 Dysphagia, unspecified: Secondary | ICD-10-CM | POA: Diagnosis not present

## 2020-06-21 DIAGNOSIS — J449 Chronic obstructive pulmonary disease, unspecified: Secondary | ICD-10-CM | POA: Diagnosis not present

## 2020-06-21 DIAGNOSIS — H9193 Unspecified hearing loss, bilateral: Secondary | ICD-10-CM | POA: Diagnosis present

## 2020-06-21 DIAGNOSIS — E876 Hypokalemia: Secondary | ICD-10-CM | POA: Diagnosis not present

## 2020-06-21 DIAGNOSIS — F05 Delirium due to known physiological condition: Secondary | ICD-10-CM | POA: Diagnosis not present

## 2020-06-21 DIAGNOSIS — E43 Unspecified severe protein-calorie malnutrition: Secondary | ICD-10-CM | POA: Insufficient documentation

## 2020-06-21 DIAGNOSIS — I4892 Unspecified atrial flutter: Secondary | ICD-10-CM | POA: Diagnosis not present

## 2020-06-21 DIAGNOSIS — R739 Hyperglycemia, unspecified: Secondary | ICD-10-CM | POA: Diagnosis not present

## 2020-06-21 DIAGNOSIS — B370 Candidal stomatitis: Secondary | ICD-10-CM | POA: Diagnosis not present

## 2020-06-21 DIAGNOSIS — R935 Abnormal findings on diagnostic imaging of other abdominal regions, including retroperitoneum: Secondary | ICD-10-CM

## 2020-06-21 DIAGNOSIS — K5909 Other constipation: Secondary | ICD-10-CM

## 2020-06-21 DIAGNOSIS — J9611 Chronic respiratory failure with hypoxia: Secondary | ICD-10-CM | POA: Diagnosis not present

## 2020-06-21 DIAGNOSIS — J9811 Atelectasis: Secondary | ICD-10-CM | POA: Diagnosis not present

## 2020-06-21 DIAGNOSIS — K6389 Other specified diseases of intestine: Secondary | ICD-10-CM | POA: Diagnosis not present

## 2020-06-21 DIAGNOSIS — Z681 Body mass index (BMI) 19 or less, adult: Secondary | ICD-10-CM

## 2020-06-21 DIAGNOSIS — E785 Hyperlipidemia, unspecified: Secondary | ICD-10-CM | POA: Diagnosis present

## 2020-06-21 DIAGNOSIS — I110 Hypertensive heart disease with heart failure: Secondary | ICD-10-CM | POA: Diagnosis not present

## 2020-06-21 DIAGNOSIS — R0602 Shortness of breath: Secondary | ICD-10-CM | POA: Diagnosis not present

## 2020-06-21 DIAGNOSIS — Z515 Encounter for palliative care: Secondary | ICD-10-CM | POA: Diagnosis not present

## 2020-06-21 DIAGNOSIS — E872 Acidosis, unspecified: Secondary | ICD-10-CM

## 2020-06-21 DIAGNOSIS — R0689 Other abnormalities of breathing: Secondary | ICD-10-CM | POA: Diagnosis not present

## 2020-06-21 DIAGNOSIS — Z9981 Dependence on supplemental oxygen: Secondary | ICD-10-CM

## 2020-06-21 DIAGNOSIS — R001 Bradycardia, unspecified: Secondary | ICD-10-CM | POA: Diagnosis not present

## 2020-06-21 DIAGNOSIS — Z20822 Contact with and (suspected) exposure to covid-19: Secondary | ICD-10-CM | POA: Diagnosis present

## 2020-06-21 DIAGNOSIS — Z888 Allergy status to other drugs, medicaments and biological substances status: Secondary | ICD-10-CM | POA: Diagnosis not present

## 2020-06-21 DIAGNOSIS — I480 Paroxysmal atrial fibrillation: Secondary | ICD-10-CM | POA: Diagnosis not present

## 2020-06-21 DIAGNOSIS — J8410 Pulmonary fibrosis, unspecified: Secondary | ICD-10-CM | POA: Diagnosis present

## 2020-06-21 DIAGNOSIS — G9341 Metabolic encephalopathy: Secondary | ICD-10-CM

## 2020-06-21 DIAGNOSIS — Z79899 Other long term (current) drug therapy: Secondary | ICD-10-CM

## 2020-06-21 DIAGNOSIS — I5033 Acute on chronic diastolic (congestive) heart failure: Secondary | ICD-10-CM | POA: Diagnosis not present

## 2020-06-21 DIAGNOSIS — R109 Unspecified abdominal pain: Secondary | ICD-10-CM | POA: Diagnosis not present

## 2020-06-21 DIAGNOSIS — K6289 Other specified diseases of anus and rectum: Secondary | ICD-10-CM | POA: Diagnosis not present

## 2020-06-21 DIAGNOSIS — Z87440 Personal history of urinary (tract) infections: Secondary | ICD-10-CM

## 2020-06-21 DIAGNOSIS — A419 Sepsis, unspecified organism: Secondary | ICD-10-CM | POA: Diagnosis not present

## 2020-06-21 DIAGNOSIS — R4182 Altered mental status, unspecified: Secondary | ICD-10-CM | POA: Diagnosis not present

## 2020-06-21 DIAGNOSIS — N390 Urinary tract infection, site not specified: Principal | ICD-10-CM | POA: Diagnosis present

## 2020-06-21 DIAGNOSIS — J439 Emphysema, unspecified: Secondary | ICD-10-CM | POA: Diagnosis present

## 2020-06-21 DIAGNOSIS — T50904A Poisoning by unspecified drugs, medicaments and biological substances, undetermined, initial encounter: Secondary | ICD-10-CM | POA: Diagnosis not present

## 2020-06-21 DIAGNOSIS — K649 Unspecified hemorrhoids: Secondary | ICD-10-CM | POA: Diagnosis present

## 2020-06-21 DIAGNOSIS — M4856XA Collapsed vertebra, not elsewhere classified, lumbar region, initial encounter for fracture: Secondary | ICD-10-CM | POA: Diagnosis not present

## 2020-06-21 DIAGNOSIS — R296 Repeated falls: Secondary | ICD-10-CM | POA: Diagnosis present

## 2020-06-21 DIAGNOSIS — R7989 Other specified abnormal findings of blood chemistry: Secondary | ICD-10-CM | POA: Diagnosis not present

## 2020-06-21 DIAGNOSIS — F0390 Unspecified dementia without behavioral disturbance: Secondary | ICD-10-CM | POA: Diagnosis present

## 2020-06-21 DIAGNOSIS — M533 Sacrococcygeal disorders, not elsewhere classified: Secondary | ICD-10-CM | POA: Diagnosis present

## 2020-06-21 DIAGNOSIS — T887XXA Unspecified adverse effect of drug or medicament, initial encounter: Secondary | ICD-10-CM | POA: Diagnosis not present

## 2020-06-21 DIAGNOSIS — F600 Paranoid personality disorder: Secondary | ICD-10-CM | POA: Diagnosis not present

## 2020-06-21 DIAGNOSIS — J441 Chronic obstructive pulmonary disease with (acute) exacerbation: Secondary | ICD-10-CM | POA: Diagnosis not present

## 2020-06-21 DIAGNOSIS — J90 Pleural effusion, not elsewhere classified: Secondary | ICD-10-CM | POA: Diagnosis not present

## 2020-06-21 LAB — URINALYSIS, COMPLETE (UACMP) WITH MICROSCOPIC
Bilirubin Urine: NEGATIVE
Glucose, UA: NEGATIVE mg/dL
Ketones, ur: NEGATIVE mg/dL
Nitrite: NEGATIVE
Protein, ur: NEGATIVE mg/dL
Specific Gravity, Urine: 1.014 (ref 1.005–1.030)
pH: 6 (ref 5.0–8.0)

## 2020-06-21 LAB — CBC WITH DIFFERENTIAL/PLATELET
Abs Immature Granulocytes: 0.2 10*3/uL — ABNORMAL HIGH (ref 0.00–0.07)
Basophils Absolute: 0.1 10*3/uL (ref 0.0–0.1)
Basophils Relative: 1 %
Eosinophils Absolute: 0.2 10*3/uL (ref 0.0–0.5)
Eosinophils Relative: 1 %
HCT: 42 % (ref 39.0–52.0)
Hemoglobin: 14.3 g/dL (ref 13.0–17.0)
Immature Granulocytes: 2 %
Lymphocytes Relative: 16 %
Lymphs Abs: 1.9 10*3/uL (ref 0.7–4.0)
MCH: 30.2 pg (ref 26.0–34.0)
MCHC: 34 g/dL (ref 30.0–36.0)
MCV: 88.6 fL (ref 80.0–100.0)
Monocytes Absolute: 1.2 10*3/uL — ABNORMAL HIGH (ref 0.1–1.0)
Monocytes Relative: 10 %
Neutro Abs: 8.2 10*3/uL — ABNORMAL HIGH (ref 1.7–7.7)
Neutrophils Relative %: 70 %
Platelets: 201 10*3/uL (ref 150–400)
RBC: 4.74 MIL/uL (ref 4.22–5.81)
RDW: 16.5 % — ABNORMAL HIGH (ref 11.5–15.5)
WBC: 11.7 10*3/uL — ABNORMAL HIGH (ref 4.0–10.5)
nRBC: 0 % (ref 0.0–0.2)

## 2020-06-21 LAB — RESPIRATORY PANEL BY RT PCR (FLU A&B, COVID)
Influenza A by PCR: NEGATIVE
Influenza B by PCR: NEGATIVE
SARS Coronavirus 2 by RT PCR: NEGATIVE

## 2020-06-21 LAB — COMPREHENSIVE METABOLIC PANEL
ALT: 26 U/L (ref 0–44)
AST: 25 U/L (ref 15–41)
Albumin: 3.1 g/dL — ABNORMAL LOW (ref 3.5–5.0)
Alkaline Phosphatase: 107 U/L (ref 38–126)
Anion gap: 9 (ref 5–15)
BUN: 10 mg/dL (ref 8–23)
CO2: 25 mmol/L (ref 22–32)
Calcium: 8.8 mg/dL — ABNORMAL LOW (ref 8.9–10.3)
Chloride: 103 mmol/L (ref 98–111)
Creatinine, Ser: 0.68 mg/dL (ref 0.61–1.24)
GFR calc Af Amer: >60 mL/min (ref >60)
GFR calc non Af Amer: >60 mL/min (ref >60)
Glucose, Bld: 91 mg/dL (ref 70–99)
Potassium: 3.6 mmol/L (ref 3.5–5.1)
Sodium: 137 mmol/L (ref 135–145)
Total Bilirubin: 1.1 mg/dL (ref 0.3–1.2)
Total Protein: 6.3 g/dL — ABNORMAL LOW (ref 6.5–8.1)

## 2020-06-21 LAB — LACTIC ACID, PLASMA
Lactic Acid, Venous: 2 mmol/L (ref 0.5–1.9)
Lactic Acid, Venous: 1.6 mmol/L (ref 0.5–1.9)

## 2020-06-21 LAB — PROTIME-INR
INR: 1.3 — ABNORMAL HIGH (ref 0.8–1.2)
Prothrombin Time: 15.2 seconds (ref 11.4–15.2)

## 2020-06-21 IMAGING — CR DG CHEST 2V
1 series · 2 of 2 positions shown · non-contrast
Comparison: [DATE]

CLINICAL DATA: Suspect sepsis.

EXAM:
CHEST - 2 VIEW

[Series 1: dg chest 2 view · 0.14mm/px · 2 of 2 slices shown]
[im 1/2]
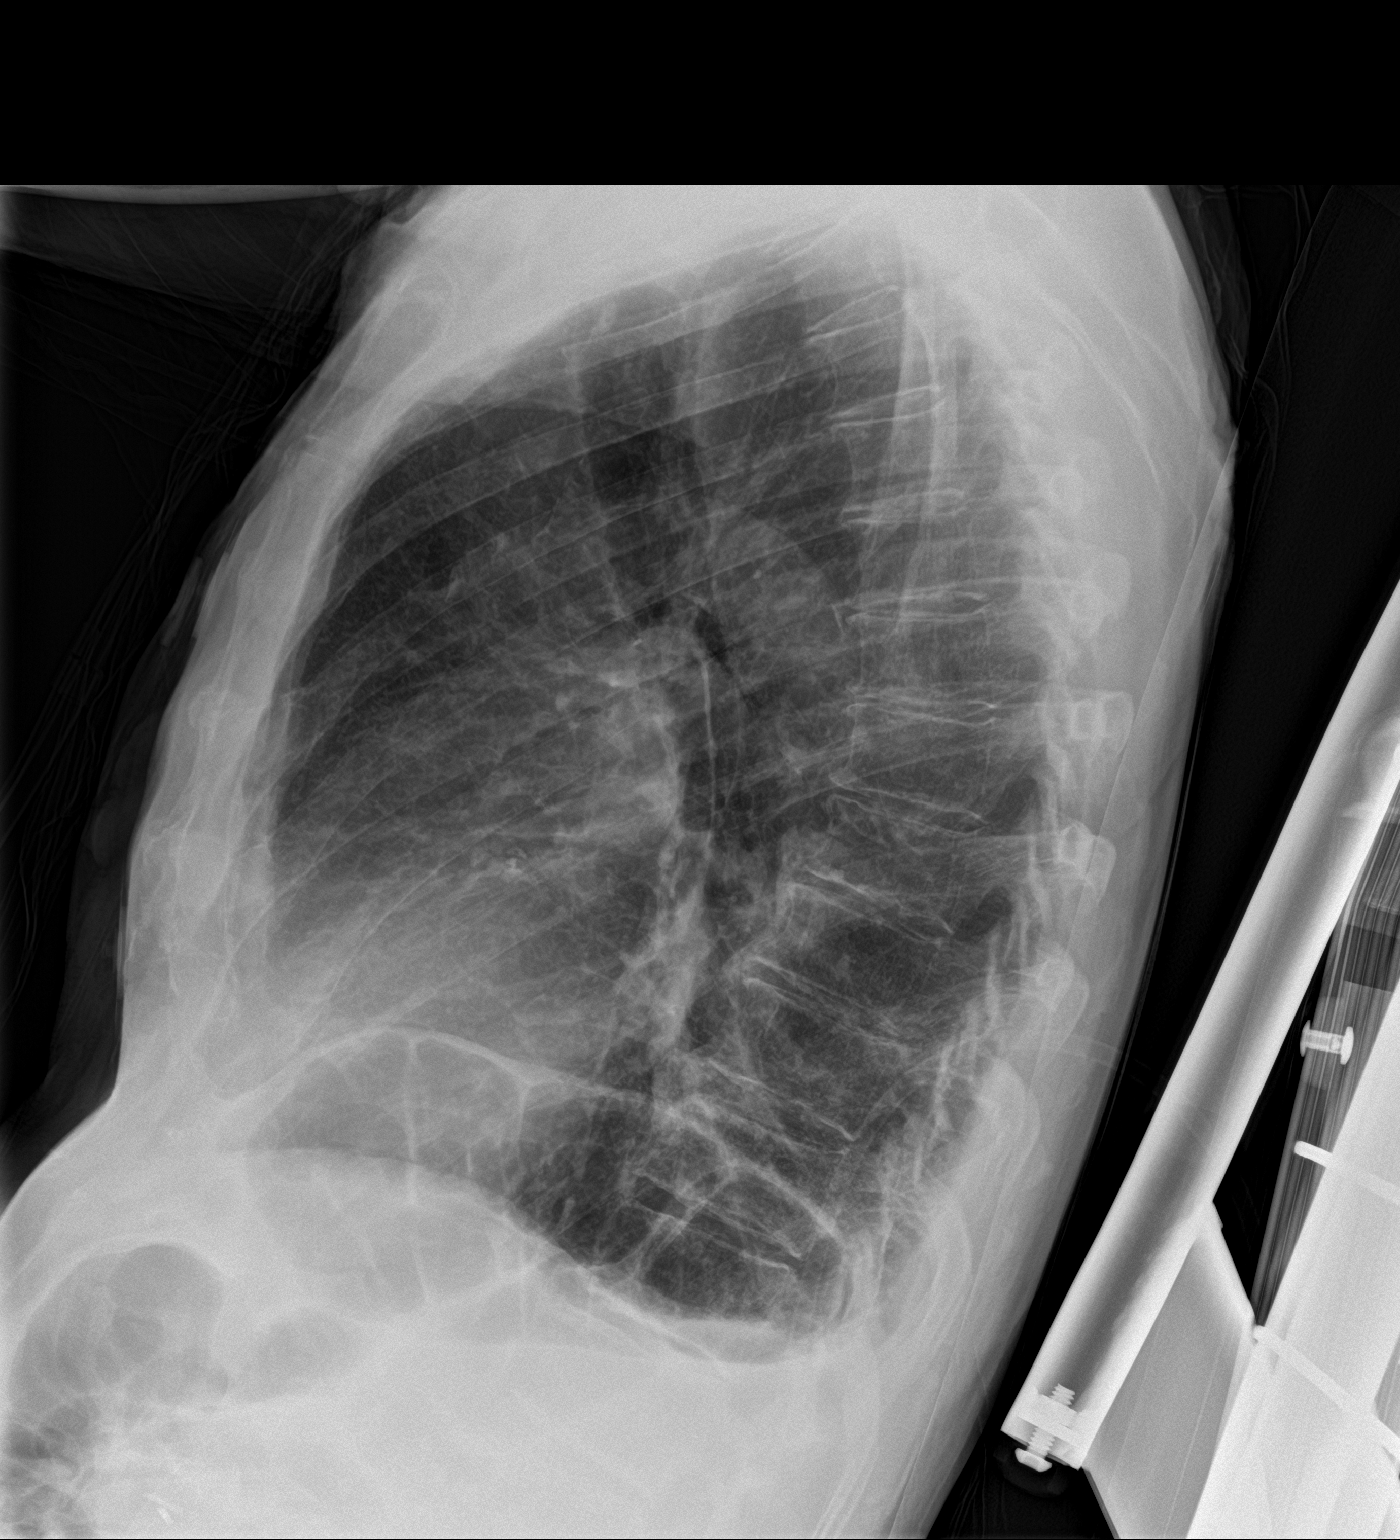
[im 2/2]
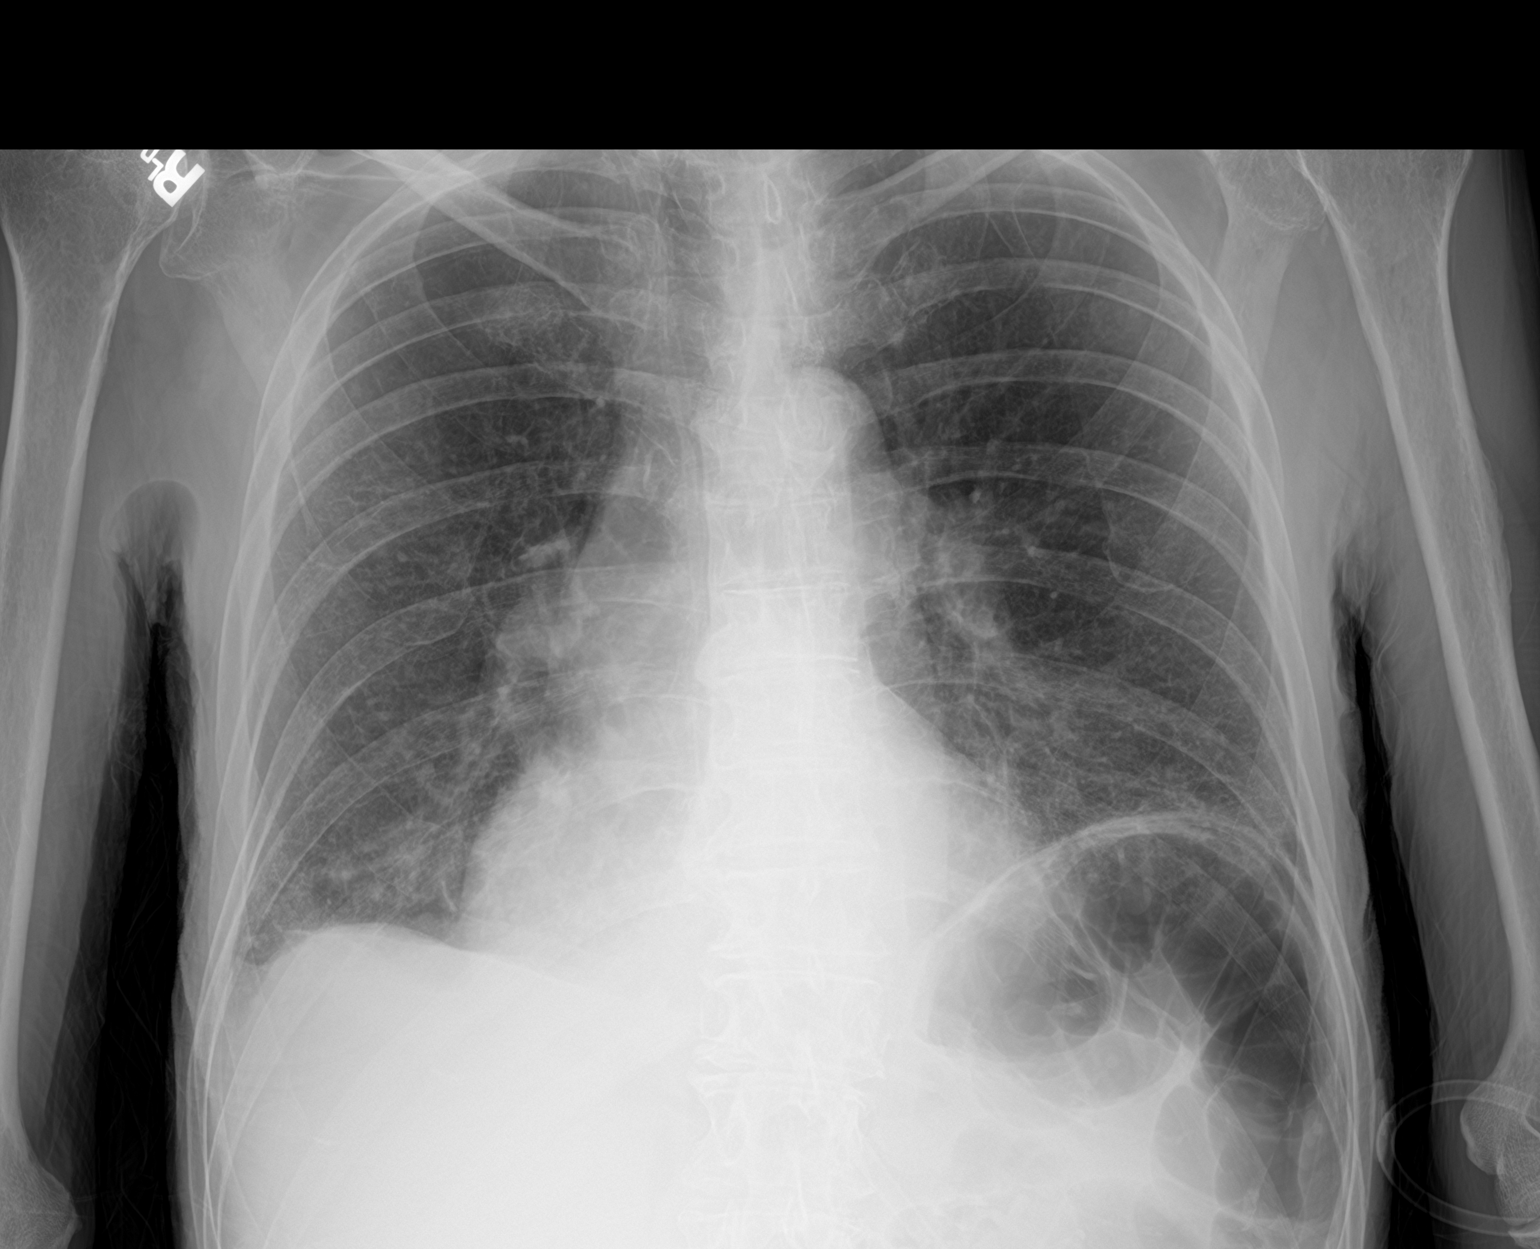

[2 of 2 positions shown; findings below may reference images not displayed]

FINDINGS: Heart size and vascularity normal. Mild bibasilar atelectasis. Small
right effusion. Elevated left hemidiaphragm with bowel gas below the
left diaphragm. Mild hyperinflation lungs. No mass lesion. No acute
skeletal abnormality.
IMPRESSION: Mild bibasilar atelectasis and small right effusion.   COPD.

## 2020-06-21 IMAGING — CT CT ABD-PELV W/ CM
2 of 5 series · 16 of 46 positions shown, 18 images · IV contrast (omnipaque)
Comparison: None.

CLINICAL DATA: Abdominal pain.

EXAM:
CT ABDOMEN AND PELVIS WITH CONTRAST
TECHNIQUE: Multidetector CT imaging of the abdomen and pelvis was performed
using the standard protocol following bolus administration of
intravenous contrast.
CONTRAST:  100mL OMNIPAQUE IOHEXOL 300 MG/ML  SOLN

[Series 2: routine abd/pel with · axial · 0.73mm/px · z∈[-849,-464]mm · 13 of 87 slices shown, 15 images]
[im 5/87  soft-tissue]
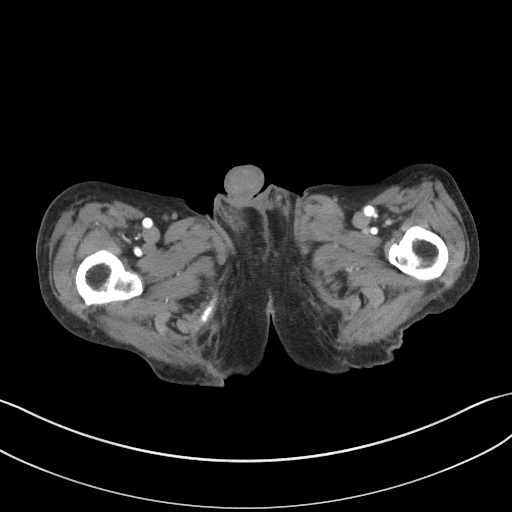
[im 5/87  bone]
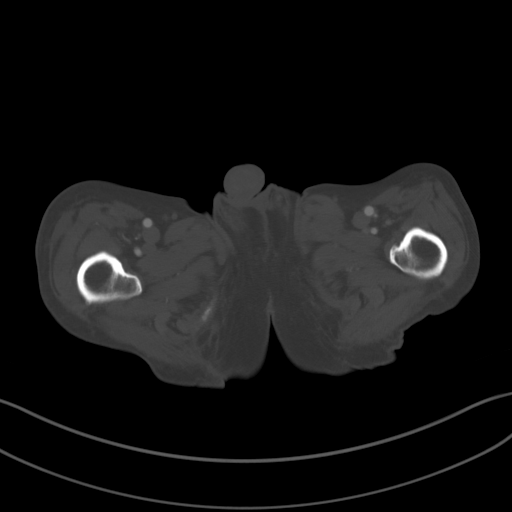
[im 13/87  soft-tissue]
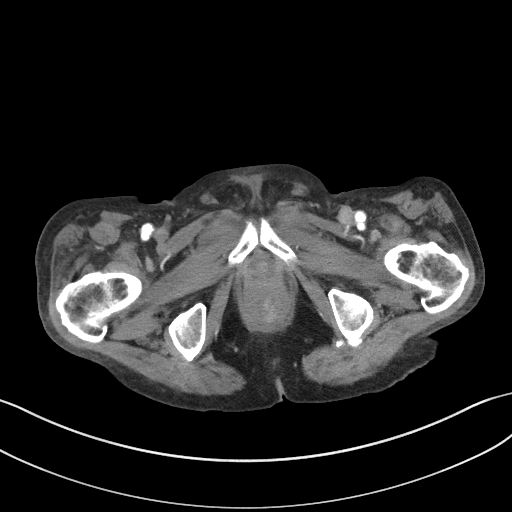
[im 18/87  soft-tissue]
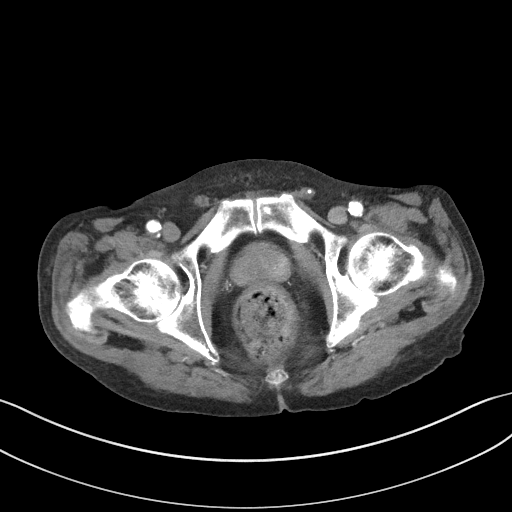
[im 26/87  soft-tissue]
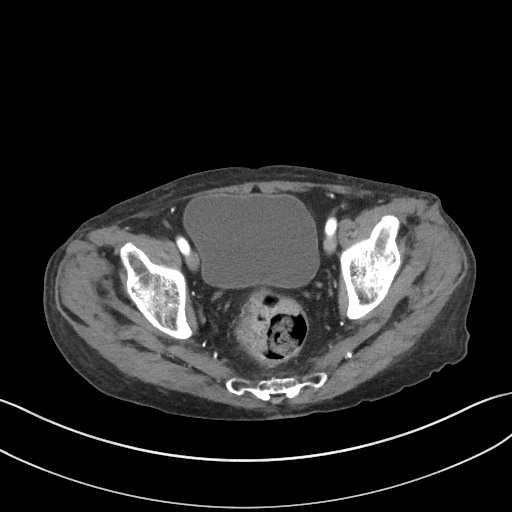
[im 31/87  soft-tissue]
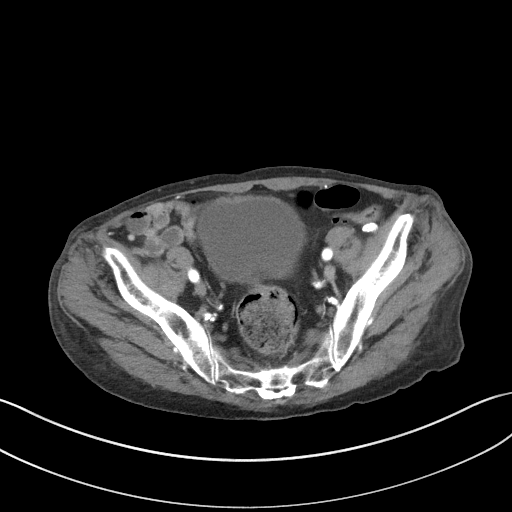
[im 39/87  soft-tissue]
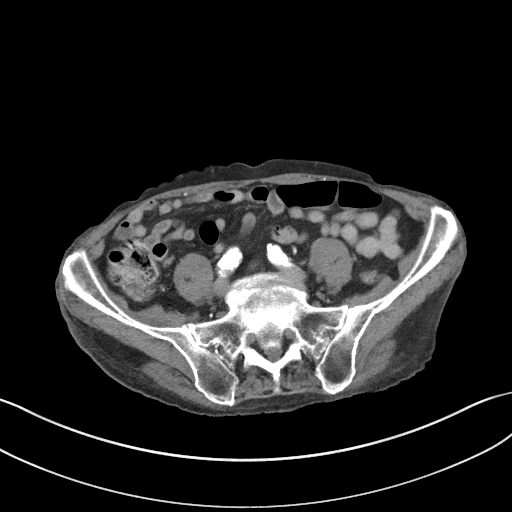
[im 44/87  soft-tissue]
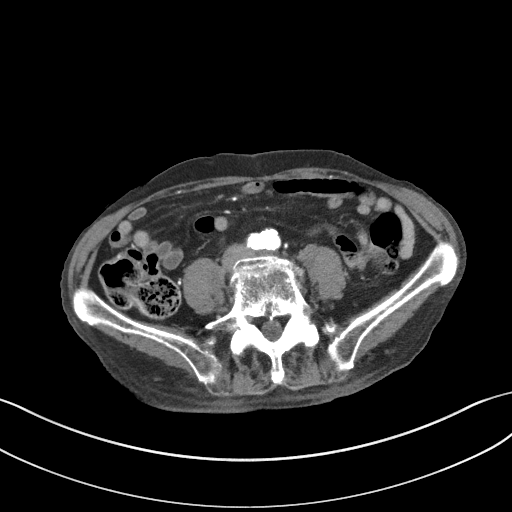
[im 48/87  soft-tissue]
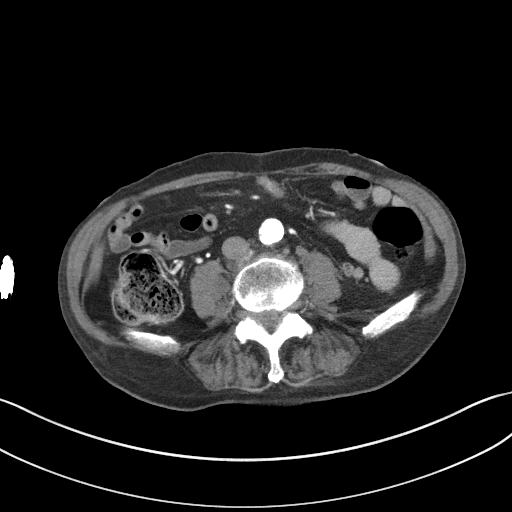
[im 56/87  soft-tissue]
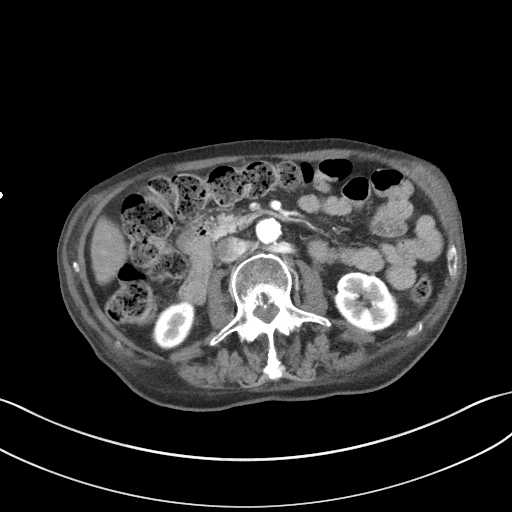
[im 56/87  bone]
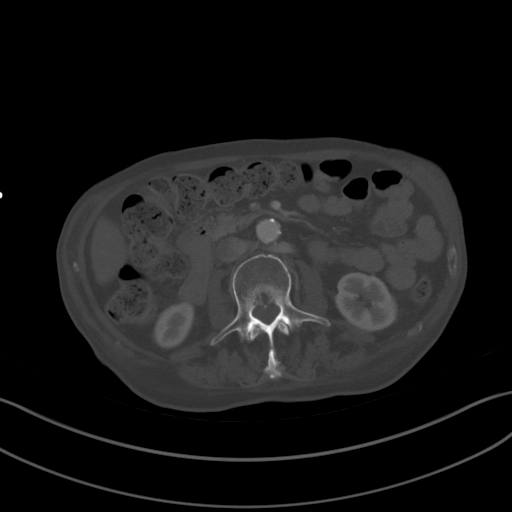
[im 61/87  soft-tissue]
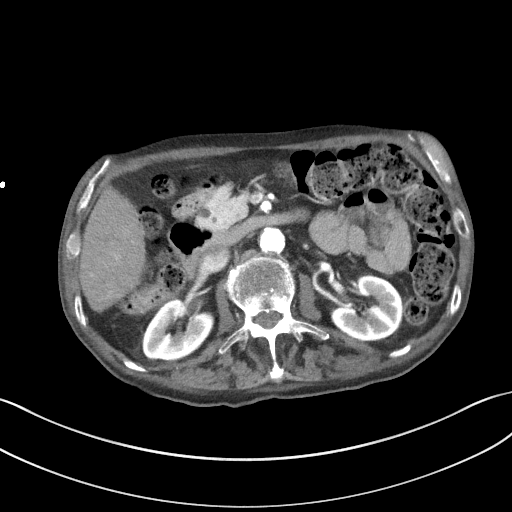
[im 69/87  soft-tissue]
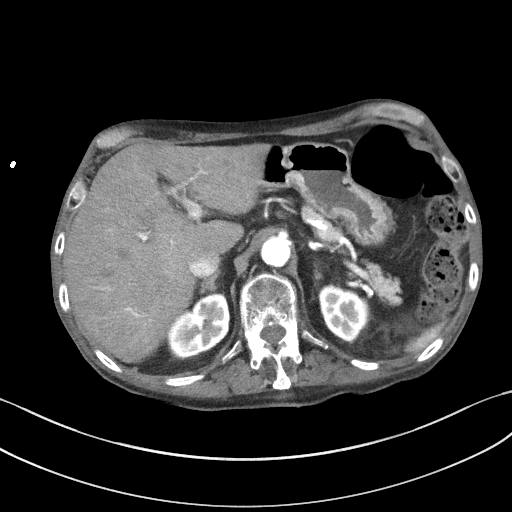
[im 74/87  soft-tissue]
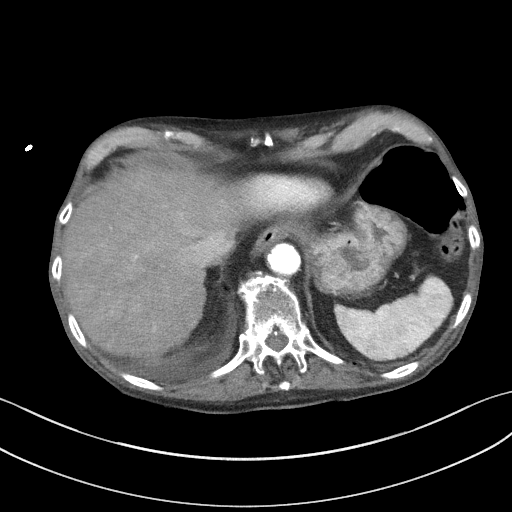
[im 82/87  soft-tissue]
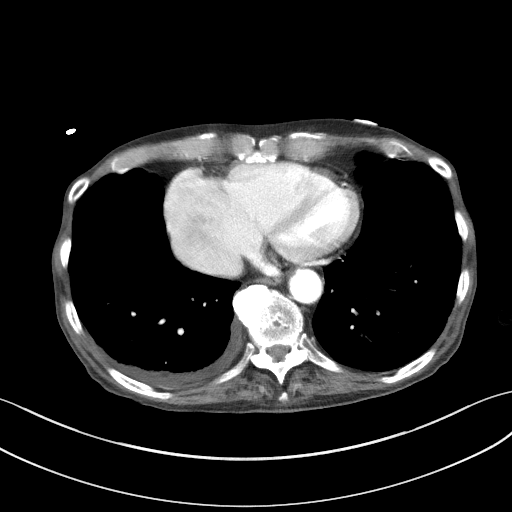

[Series 5: coronal st · coronal · 0.71mm/px · 3 of 71 slices shown]
[im 24/71  soft-tissue]
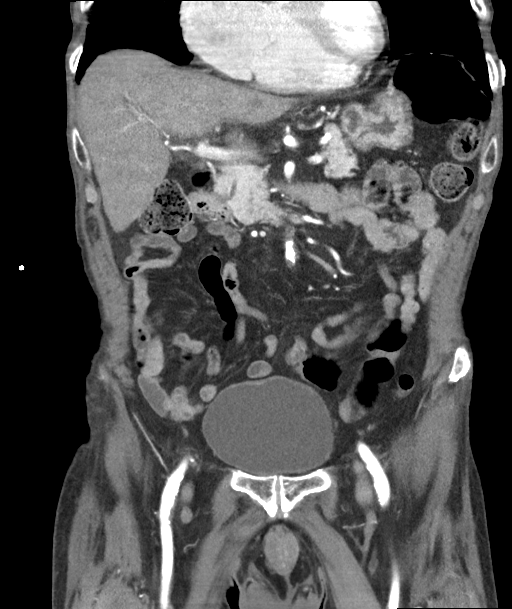
[im 32/71  soft-tissue]
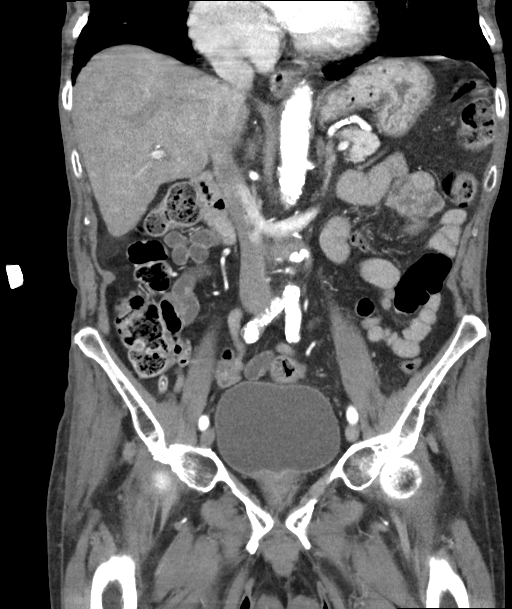
[im 39/71  soft-tissue]
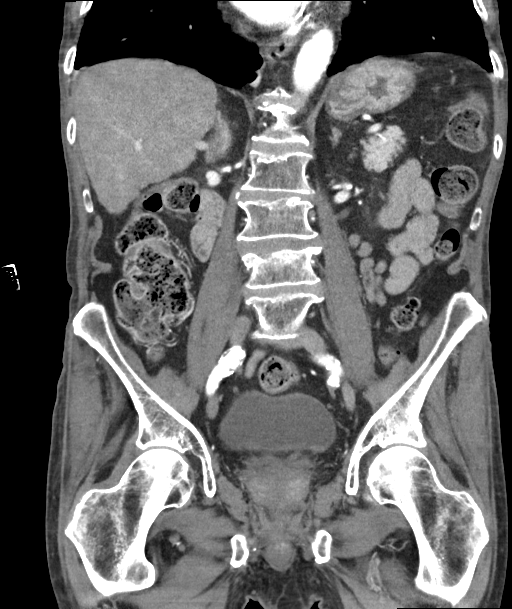

[16 of 46 positions shown; findings below may reference images not displayed]

FINDINGS: Lower chest: A trace amount of atelectasis is seen within the right
lung base.

A very small right pleural effusion is noted.

Hepatobiliary: No focal liver abnormality is seen. Status post
cholecystectomy. No biliary dilatation.

Pancreas: Unremarkable. No pancreatic ductal dilatation or
surrounding inflammatory changes.

Spleen: Normal in size without focal abnormality.

Adrenals/Urinary Tract: Adrenal glands are unremarkable. Kidneys are
normal, without renal calculi, focal lesion, or hydronephrosis.
Bladder is unremarkable.

Stomach/Bowel: Stomach is within normal limits. Appendix appears
normal. No evidence of bowel dilatation. There is marked severity
thickening of the rectum and adjacent portion of the distal sigmoid
colon (axial CT images 71 through 75, CT series number 2). This is
slightly asymmetric in appearance, right greater than left. No
associated inflammatory fat stranding is seen.

Vascular/Lymphatic: There is marked severity calcification and
atherosclerosis of the abdominal aorta and bilateral common iliac
arteries, without evidence of aneurysmal dilatation. No enlarged
abdominal or pelvic lymph nodes.

Reproductive: The prostate gland is mildly enlarged.

Other: No abdominal wall hernia or abnormality. No abdominopelvic
ascites.

Musculoskeletal: Moderate to marked severity multilevel degenerative
changes seen throughout the lumbar spine.
IMPRESSION: 1. Asymmetric of the rectum and adjacent portion of the sigmoid
colon, which may be secondary to an underlying mass. Correlation
with physical examination and MRI is recommended.
2. Evidence of prior cholecystectomy.
3. Very small right pleural effusion.
4. Mildly enlarged prostate gland.
5. Moderate to marked severity multilevel degenerative changes
throughout the lumbar spine.
6. Aortic atherosclerosis.

Aortic Atherosclerosis ([UI]-[UI]).

## 2020-06-21 IMAGING — CT CT HEAD W/O CM
3 series · 15 of 47 positions shown, 18 images · non-contrast
Comparison: [DATE]

CLINICAL DATA: Altered mental status.

EXAM:
CT HEAD WITHOUT CONTRAST
TECHNIQUE: Contiguous axial images were obtained from the base of the skull
through the vertex without intravenous contrast.

[Series 2: head wo · axial · 0.43mm/px · z∈[-121,+9]mm · 9 of 32 slices shown, 12 images]
[im 3/32  brain]
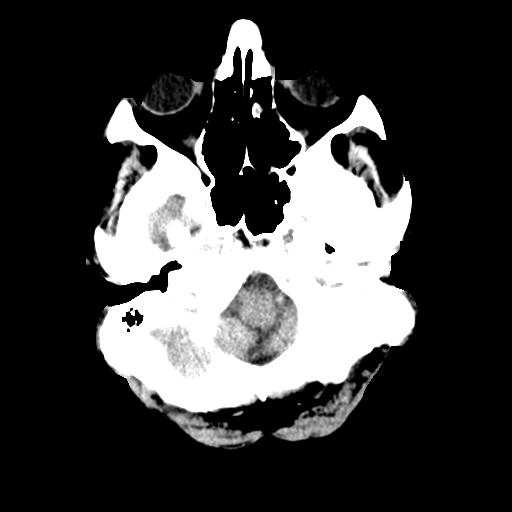
[im 3/32  bone]
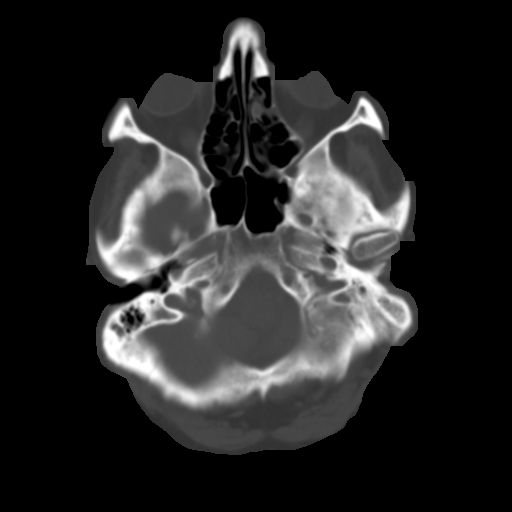
[im 6/32  brain]
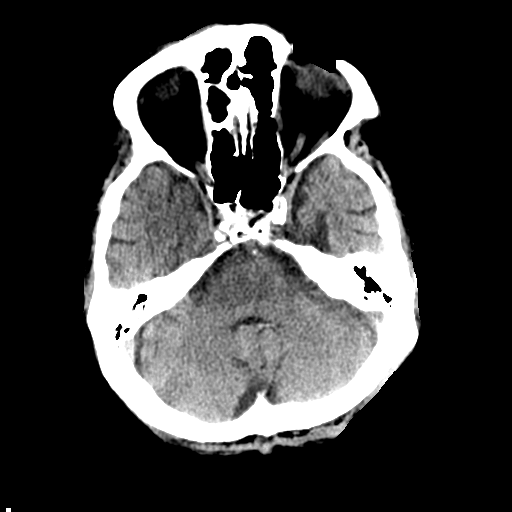
[im 9/32  brain]
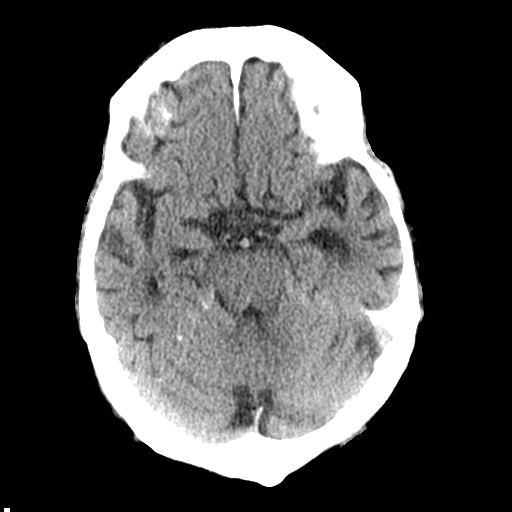
[im 12/32  brain]
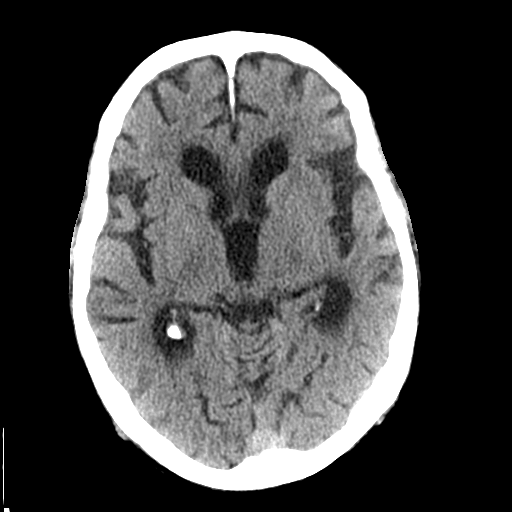
[im 17/32  brain]
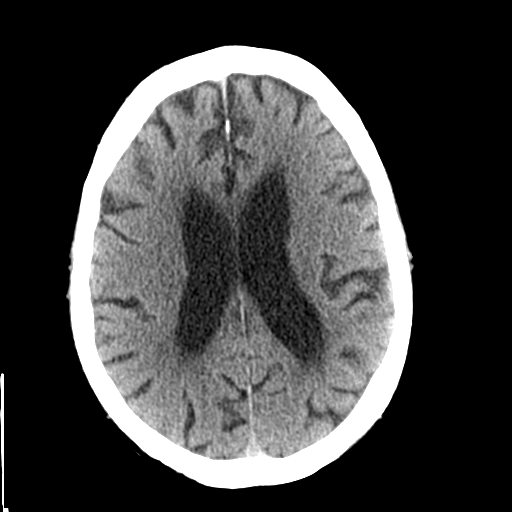
[im 17/32  bone]
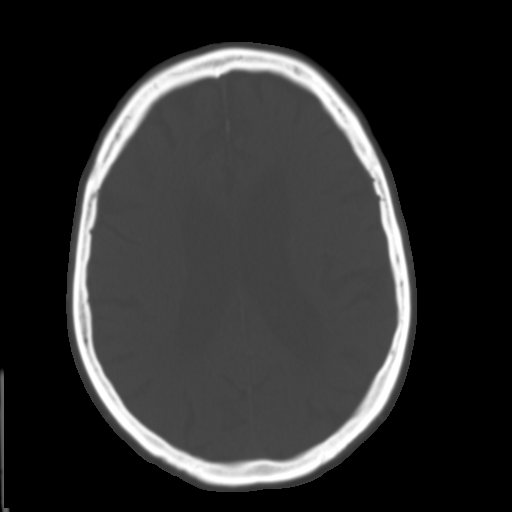
[im 20/32  brain]
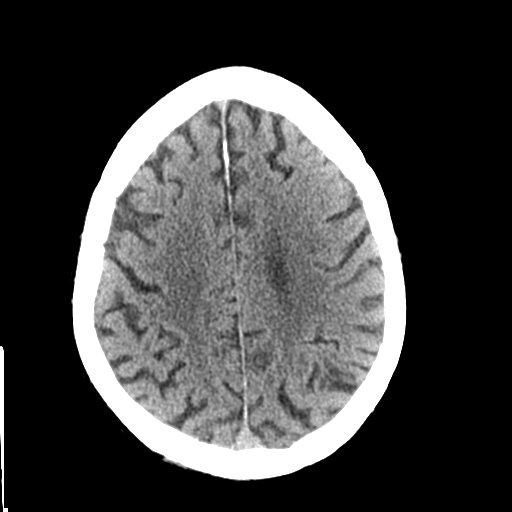
[im 23/32  brain]
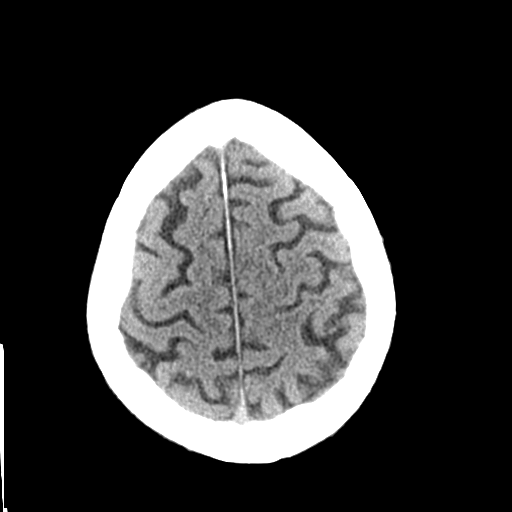
[im 26/32  brain]
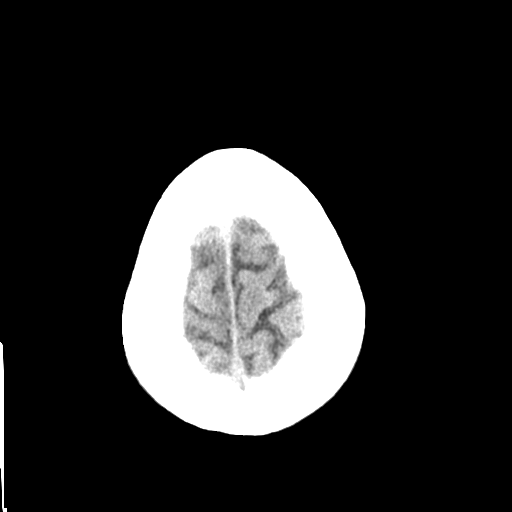
[im 29/32  brain]
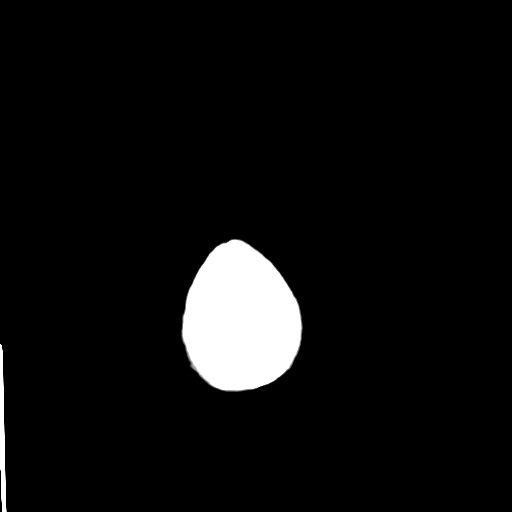
[im 29/32  bone]
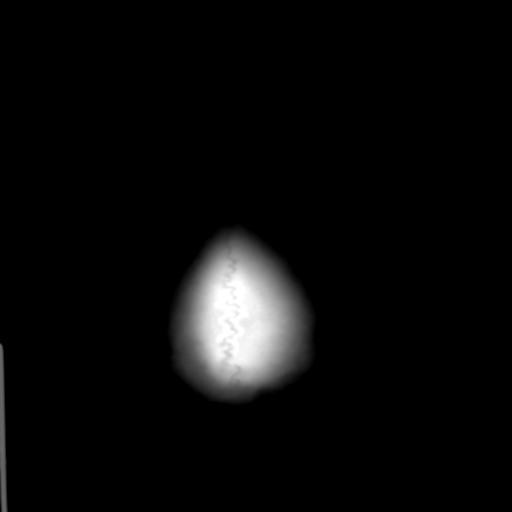

[Series 4: coronal soft tissue · coronal · 0.31mm/px · 3 of 66 slices shown]
[im 22/66  brain]
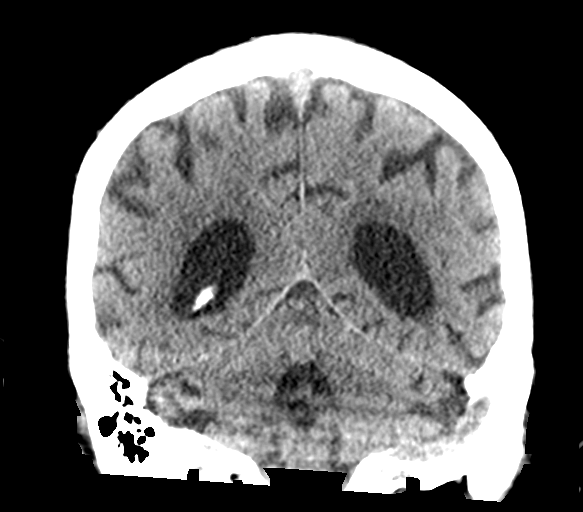
[im 29/66  brain]
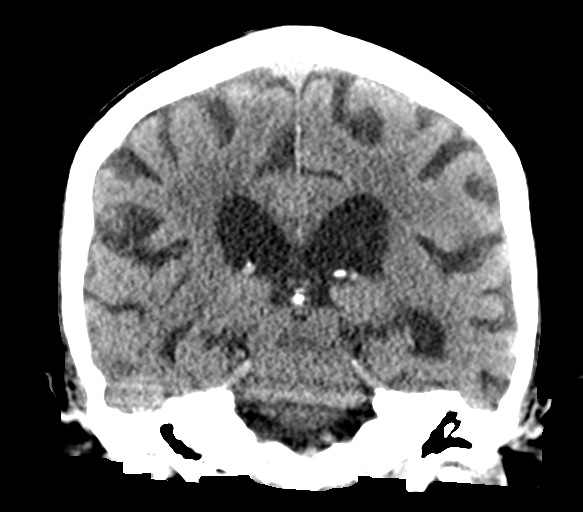
[im 37/66  brain]
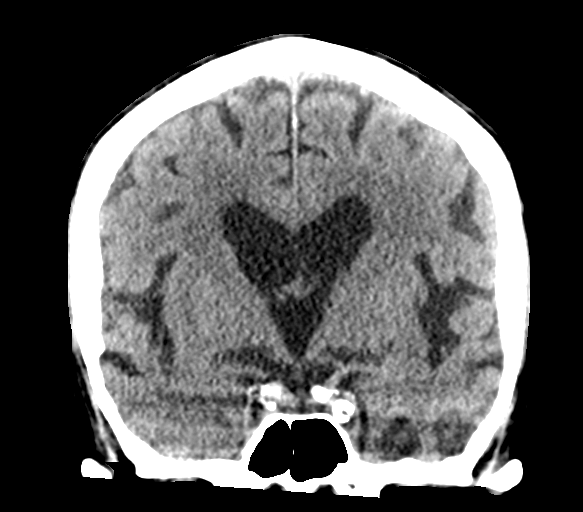

[Series 5: sagittal soft tissue · sagittal · 0.33mm/px · 3 of 52 slices shown]
[im 18/52  brain]
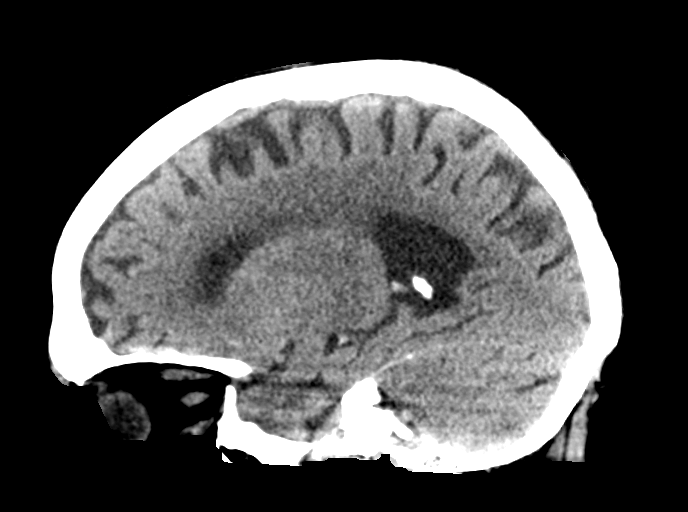
[im 26/52  brain]
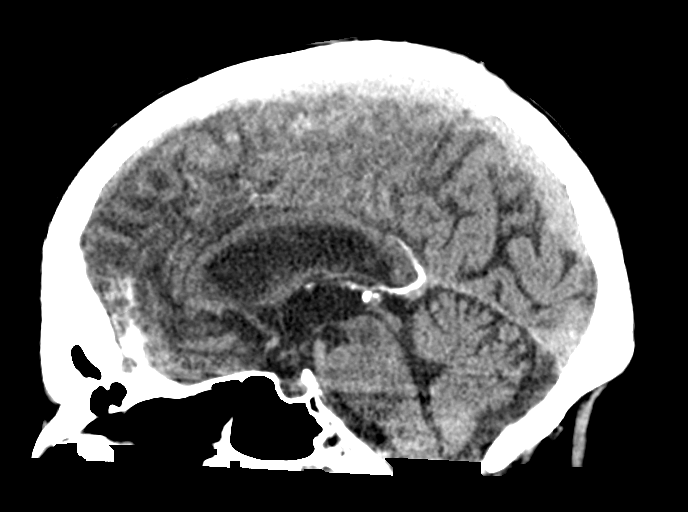
[im 35/52  brain]
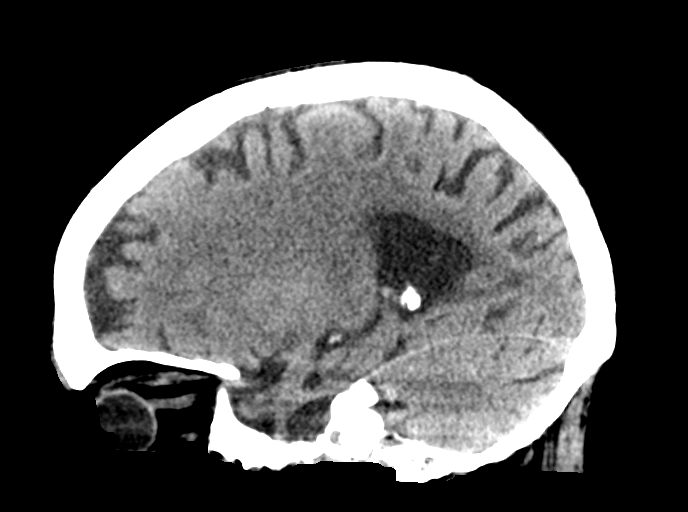

[15 of 47 positions shown; findings below may reference images not displayed]

FINDINGS: Brain: There is mild cerebral atrophy with widening of the
extra-axial spaces and ventricular dilatation.
There are areas of decreased attenuation within the white matter
tracts of the supratentorial brain, consistent with microvascular
disease changes.

Vascular: No hyperdense vessel or unexpected calcification.

Skull: Normal. Negative for fracture or focal lesion.

Sinuses/Orbits: Very mild, bilateral ethmoid sinus mucosal
thickening is seen.

Other: None.
IMPRESSION: 1. Generalized cerebral atrophy.
2. Very mild, bilateral ethmoid sinus disease.
3. No acute intracranial abnormality.

## 2020-06-21 MED ORDER — SODIUM CHLORIDE 0.9 % IV SOLN: INTRAVENOUS | Status: DC

## 2020-06-21 MED ORDER — PANTOPRAZOLE SODIUM 40 MG PO TBEC
40.0000 mg | DELAYED_RELEASE_TABLET | Freq: Every day | ORAL | Status: DC
  Administered 2020-06-22 – 2020-07-09 (×18): 40 mg via ORAL
  Filled 2020-06-21 (×18): qty 1

## 2020-06-21 MED ORDER — EZETIMIBE 10 MG PO TABS
10.0000 mg | ORAL_TABLET | ORAL | Status: DC
  Administered 2020-06-22: 10 mg via ORAL
  Filled 2020-06-21: qty 1

## 2020-06-21 MED ORDER — ZOLPIDEM TARTRATE 5 MG PO TABS: 5.0000 mg | ORAL_TABLET | Freq: Every evening | ORAL | Status: DC | PRN

## 2020-06-21 MED ORDER — SODIUM CHLORIDE 0.9 % IV BOLUS
500.0000 mL | Freq: Once | INTRAVENOUS | Status: AC
  Administered 2020-06-21: 500 mL via INTRAVENOUS

## 2020-06-21 MED ORDER — ACETAMINOPHEN 650 MG RE SUPP: 650.0000 mg | Freq: Four times a day (QID) | RECTAL | Status: DC | PRN

## 2020-06-21 MED ORDER — FUROSEMIDE 20 MG PO TABS
10.0000 mg | ORAL_TABLET | ORAL | Status: DC
  Administered 2020-06-23: 10 mg via ORAL
  Filled 2020-06-21: qty 0.5
  Filled 2020-06-21: qty 1

## 2020-06-21 MED ORDER — ESCITALOPRAM OXALATE 10 MG PO TABS
10.0000 mg | ORAL_TABLET | Freq: Every day | ORAL | Status: DC
  Administered 2020-06-22 – 2020-07-09 (×18): 10 mg via ORAL
  Filled 2020-06-21 (×19): qty 1

## 2020-06-21 MED ORDER — DILTIAZEM HCL ER COATED BEADS 180 MG PO CP24
360.0000 mg | ORAL_CAPSULE | Freq: Every day | ORAL | Status: DC
  Administered 2020-06-22 – 2020-06-27 (×6): 360 mg via ORAL
  Filled 2020-06-21: qty 3
  Filled 2020-06-21: qty 2
  Filled 2020-06-21 (×2): qty 3
  Filled 2020-06-21 (×3): qty 2

## 2020-06-21 MED ORDER — ONDANSETRON HCL 4 MG PO TABS: 4.0000 mg | ORAL_TABLET | Freq: Four times a day (QID) | ORAL | Status: DC | PRN

## 2020-06-21 MED ORDER — ADULT MULTIVITAMIN W/MINERALS CH
1.0000 | ORAL_TABLET | Freq: Every day | ORAL | Status: DC
  Administered 2020-06-22 – 2020-07-09 (×18): 1 via ORAL
  Filled 2020-06-21 (×18): qty 1

## 2020-06-21 MED ORDER — APIXABAN 2.5 MG PO TABS
2.5000 mg | ORAL_TABLET | Freq: Two times a day (BID) | ORAL | Status: DC
  Administered 2020-06-22 – 2020-07-09 (×34): 2.5 mg via ORAL
  Filled 2020-06-21 (×35): qty 1

## 2020-06-21 MED ORDER — PREDNISONE 20 MG PO TABS
10.0000 mg | ORAL_TABLET | Freq: Every day | ORAL | Status: DC
  Administered 2020-06-22 – 2020-06-24 (×3): 10 mg via ORAL
  Filled 2020-06-21 (×3): qty 1

## 2020-06-21 MED ORDER — MOMETASONE FURO-FORMOTEROL FUM 200-5 MCG/ACT IN AERO
2.0000 | INHALATION_SPRAY | Freq: Two times a day (BID) | RESPIRATORY_TRACT | Status: DC
  Administered 2020-06-22 – 2020-07-08 (×33): 2 via RESPIRATORY_TRACT
  Filled 2020-06-21: qty 8.8

## 2020-06-21 MED ORDER — IOHEXOL 300 MG/ML  SOLN
100.0000 mL | Freq: Once | INTRAMUSCULAR | Status: AC | PRN
  Administered 2020-06-21: 100 mL via INTRAVENOUS

## 2020-06-21 MED ORDER — SODIUM CHLORIDE 0.9 % IV SOLN
1.0000 g | INTRAVENOUS | Status: DC
  Administered 2020-06-22 – 2020-06-25 (×4): 1 g via INTRAVENOUS
  Filled 2020-06-21: qty 10
  Filled 2020-06-21: qty 1
  Filled 2020-06-21: qty 10
  Filled 2020-06-21: qty 1
  Filled 2020-06-21: qty 10

## 2020-06-21 MED ORDER — ACETAMINOPHEN 325 MG PO TABS
650.0000 mg | ORAL_TABLET | Freq: Four times a day (QID) | ORAL | Status: DC | PRN
  Administered 2020-06-24 – 2020-07-05 (×6): 650 mg via ORAL
  Filled 2020-06-21 (×6): qty 2

## 2020-06-21 MED ORDER — PSYLLIUM 95 % PO PACK
1.0000 | PACK | Freq: Every day | ORAL | Status: DC | PRN
  Administered 2020-06-29 – 2020-07-06 (×2): 1 via ORAL
  Filled 2020-06-21 (×5): qty 1

## 2020-06-21 MED ORDER — MORPHINE SULFATE (PF) 2 MG/ML IV SOLN: 2.0000 mg | INTRAVENOUS | Status: DC | PRN

## 2020-06-21 MED ORDER — FLUTICASONE PROPIONATE 50 MCG/ACT NA SUSP
2.0000 | Freq: Every day | NASAL | Status: DC
  Administered 2020-06-22 – 2020-07-08 (×17): 2 via NASAL
  Filled 2020-06-21: qty 16

## 2020-06-21 MED ORDER — ONDANSETRON HCL 4 MG/2ML IJ SOLN: 4.0000 mg | Freq: Four times a day (QID) | INTRAMUSCULAR | Status: DC | PRN

## 2020-06-21 MED ORDER — SODIUM CHLORIDE 0.9 % IV SOLN
2.0000 g | Freq: Once | INTRAVENOUS | Status: AC
  Administered 2020-06-21: 2 g via INTRAVENOUS
  Filled 2020-06-21: qty 20

## 2020-06-21 MED ORDER — ALBUTEROL SULFATE (2.5 MG/3ML) 0.083% IN NEBU
2.5000 mg | INHALATION_SOLUTION | Freq: Four times a day (QID) | RESPIRATORY_TRACT | Status: DC | PRN
  Administered 2020-06-25: 2.5 mg via RESPIRATORY_TRACT
  Filled 2020-06-21: qty 3

## 2020-06-21 MED ORDER — METOPROLOL SUCCINATE ER 50 MG PO TB24
50.0000 mg | ORAL_TABLET | Freq: Every day | ORAL | Status: DC
  Administered 2020-06-22 – 2020-06-23 (×2): 50 mg via ORAL
  Filled 2020-06-21 (×3): qty 1

## 2020-06-21 MED ORDER — NITROGLYCERIN 0.4 MG SL SUBL: 0.4000 mg | SUBLINGUAL_TABLET | SUBLINGUAL | Status: DC | PRN

## 2020-06-21 NOTE — ED Notes (Signed)
Oxygen tank checked and RN confirmed pt has remaining oxygen in tank. Pt in NAD. Privacy screen placed for comfort.

## 2020-06-21 NOTE — ED Notes (Signed)
Pt to CT

## 2020-06-21 NOTE — H&P (Signed)
History and Physical    Eric Li GMW:[id number] DOB: 03/03/1935 DOA: 05/26/2020  PCP: [name], MD   Patient coming from: Home  I have personally briefly reviewed patient's old medical records in [place]  Chief Complaint: Abdominal pain, confusion  HPI: Eric Li is a 85 y.o. male with medical history significant for paroxysmal A. fib on apixaban, HTN, CAD, COPD and pulmonary fibrosis with chronic respiratory failure on home O2 at 3 L, who was brought to the emergency room by EMS because of confusion. Family called with concerns for patient waking up disoriented believing he was somewhere else and appeared to be not acting himself and " talking out of his head". Wife also voiced concern for possible UTI as his urination was frequent and with strong odor. He has had no fever or chills, or chest pain or shortness of breath. Has had no nausea, vomiting or change in bowel habits. He is fully vaccinated against Covid. ED Course: On arrival, his vitals were within normal limits without fever or tachycardia and with O2 sat 97% on O2 at home flow rate . Blood work was significant for WBC of 11,700 and lactic acid of 2>>1.6. Urinalysis showed pyuria. Other blood work mostly unremarkable. EKG as reviewed by me : NSR with RBBB with nonspecific ST-T wave changes Chest x-ray showed no acute findings, head CT no acute findings. Due to patient complaint of abdominal pain CT abdomen and pelvis with contrast was done which showed asymmetry of the rectum and adjacent portion of the sigmoid colon. Patient was started on Rocephin. Hospitalist consulted for admission. Review of Systems: Unable to obtain history due to confusion  Past Medical History:  Diagnosis Date  . Bilateral hearing loss 03/03/2016  . CAD (coronary artery disease) 02/24/2014  . CHF (congestive heart failure) ([place])   . COPD, moderate ([place]) 08/29/2015  . Hemorrhoids 02/24/2014  . HTN (hypertension) 02/24/2014  .  Hyperlipidemia, unspecified 02/24/2014  . Nephrolithiasis   . SOB (shortness of breath) on exertion 03/27/2015  . Statin intolerance 03/30/2015    Past Surgical History:  Procedure Laterality Date  . CHOLECYSTECTOMY    . TONSILLECTOMY       reports that he has quit smoking. He has never used smokeless tobacco. He reports that he does not drink alcohol and does not use drugs.  Allergies  Allergen Reactions  . Simvastatin     Family History  Problem Relation Age of Onset  . Bladder Cancer Neg Hx   . Prolactinoma Neg Hx   . Prostate cancer Neg Hx   . Kidney cancer Neg Hx       Prior to Admission medications   Medication Sig Start Date End Date Taking? Authorizing Provider  acyclovir cream (ZOVIRAX) 5 % Apply 1 Application topically 5 (five) times daily   Yes [provider]  albuterol (PROVENTIL) (2.5 MG/3ML) 0.083% nebulizer solution Take 2.5 mg by nebulization every 6 (six) hours as needed for wheezing or shortness of breath.   Yes [provider]  apixaban (ELIQUIS) 2.5 MG TABS tablet Take 2.5 mg by mouth 2 (two) times daily.   Yes [provider]  calcium carbonate (TUMS - DOSED IN MG ELEMENTAL CALCIUM) 500 MG chewable tablet Chew 1 tablet by mouth daily as needed for indigestion or heartburn.   Yes [provider]  diltiazem (CARDIZEM CD) 360 MG 24 hr capsule Take 360 mg by mouth daily.   Yes [provider]  escitalopram (LEXAPRO) 5 MG  tablet Take 10 mg by mouth daily.  03/18/19  Yes [provider]  ezetimibe (ZETIA) 10 MG tablet Take 10 mg by mouth every Monday, Wednesday, and Friday.    Yes [provider]  fluticasone (FLONASE) 50 MCG/ACT nasal spray Place 2 sprays into both nostrils daily.   Yes [provider]  Fluticasone-Salmeterol (ADVAIR DISKUS) 250-50 MCG/DOSE AEPB INHALE 1 INHALATION INTO THE LUNGS EVERY 12 (TWELVE) HOURS 07/21/19  Yes [provider]  furosemide (LASIX) 20 MG tablet Take  10 mg by mouth every other day.    Yes [provider]  ipratropium (ATROVENT) 0.03 % nasal spray Place 2 sprays into both nostrils 3 (three) times daily as needed for rhinitis (runny nose).   Yes [provider]  metoprolol succinate (TOPROL-XL) 50 MG 24 hr tablet Take 50 mg by mouth daily.    Yes [provider]  mometasone (ELOCON) 0.1 % lotion INSTILL 4 DROPS IN EACH EAR AT BEDTIME AS NEEDED FOR DRY SKIN AND ITCHING   Yes [provider]  Multiple Vitamin (MULTIVITAMIN WITH MINERALS) TABS tablet Take 1 tablet by mouth daily.   Yes [provider]  nitroGLYCERIN (NITROSTAT) 0.4 MG SL tablet Place 0.4 mg under the tongue every 5 (five) minutes as needed for chest pain.   Yes [provider]  pantoprazole (PROTONIX) 40 MG tablet Take 40 mg by mouth daily.  09/21/19  Yes [provider]  predniSONE (DELTASONE) 10 MG tablet Take 10 mg by mouth daily with breakfast.   Yes [provider]  psyllium (HYDROCIL/METAMUCIL) 95 % PACK Take 1 packet by mouth daily as needed for mild constipation or moderate constipation.   Yes [provider]  tiZANidine (ZANAFLEX) 4 MG tablet Take 4 mg by mouth at bedtime.  07/22/19  Yes [provider]  zolpidem (AMBIEN) 5 MG tablet TAKE 1 TABLET BY MOUTH EVERY DAY AT BEDTIME AS NEEDED FOR SLEEP 07/04/19  Yes [provider]  mirtazapine (REMERON) 7.5 MG tablet Take by mouth. 07/22/19 05/26/20  [provider]    Physical Exam: Vitals:   06/14/2020 1417 05/24/2020 1635 05/26/2020 1854 05/28/2020 2033  BP: 125/89 122/76 (!) 123/99 (!) 141/87  Pulse: 77 99 87 90  Resp: 20 20 16 18   Temp: 97.8 F (36.6 C) 98 F (36.7 C) 98 F (36.7 C)   TempSrc: Oral Oral Oral   SpO2: 95% 93% 97% 93%  Weight:      Height:         Vitals:   06/08/2020 1417 06/06/2020 1635 06/09/2020 1854 06/15/2020 2033  BP: 125/89 122/76 (!) 123/99 (!) 141/87  Pulse: 77 99 87 90  Resp: 20 20 16 18     Temp: 97.8 F (36.6 C) 98 F (36.7 C) 98 F (36.7 C)   TempSrc: Oral Oral Oral   SpO2: 95% 93% 97% 93%  Weight:      Height:          Constitutional: Alert, pleasantly confused, oriented to name only.  Answering questions but not relevant to the question asked  HEENT:      Head: Normocephalic and atraumatic.         Eyes: PERLA, EOMI, Conjunctivae are normal. Sclera is non-icteric.       Mouth/Throat: Mucous membranes are moist.       Neck: Supple with no signs of meningismus. Cardiovascular: Regular rate and rhythm. No murmurs, gallops, or rubs. 2+ symmetrical distal pulses are present . No JVD. No LE  edema Respiratory: Respiratory effort normal .Lungs sounds clear bilaterally. No wheezes, crackles, or rhonchi.  Gastrointestinal: Soft, non tender, and non distended with positive bowel sounds. No rebound or guarding. Genitourinary: No CVA tenderness. Musculoskeletal: Nontender with normal range of motion in all extremities. No cyanosis, or erythema of extremities. Neurologic:  Face is symmetric. Moving all extremities. No gross focal neurologic deficits . Skin: Skin is warm, dry.  No rash or ulcers Psychiatric: Mood and affect are normal    Labs on Admission: I have personally reviewed following labs and imaging studies  CBC: Recent Labs  Lab 05/27/2020 1307  WBC 11.7*  NEUTROABS 8.2*  HGB 14.3  HCT 42.0  MCV 88.6  PLT 458   Basic Metabolic Panel: Recent Labs  Lab 06/08/2020 1307  NA 137  K 3.6  CL 103  CO2 25  GLUCOSE 91  BUN 10  CREATININE 0.68  CALCIUM 8.8*   GFR: Estimated Creatinine Clearance: 52.9 mL/min (by C-G formula based on SCr of 0.68 mg/dL). Liver Function Tests: Recent Labs  Lab 05/29/2020 1307  AST 25  ALT 26  ALKPHOS 107  BILITOT 1.1  PROT 6.3*  ALBUMIN 3.1*   No results for input(s): LIPASE, AMYLASE in the last 168 hours. No results for input(s): AMMONIA in the last 168 hours. Coagulation Profile: Recent Labs  Lab 05/27/2020 1307   INR 1.3*   Cardiac Enzymes: No results for input(s): CKTOTAL, CKMB, CKMBINDEX, TROPONINI in the last 168 hours. BNP (last 3 results) No results for input(s): PROBNP in the last 8760 hours. HbA1C: No results for input(s): HGBA1C in the last 72 hours. CBG: No results for input(s): GLUCAP in the last 168 hours. Lipid Profile: No results for input(s): CHOL, HDL, LDLCALC, TRIG, CHOLHDL, LDLDIRECT in the last 72 hours. Thyroid Function Tests: No results for input(s): TSH, T4TOTAL, FREET4, T3FREE, THYROIDAB in the last 72 hours. Anemia Panel: No results for input(s): VITAMINB12, FOLATE, FERRITIN, TIBC, IRON, RETICCTPCT in the last 72 hours. Urine analysis:    Component Value Date/Time   COLORURINE YELLOW (A) 06/13/2020 2037   APPEARANCEUR HAZY (A) 06/16/2020 2037   APPEARANCEUR Clear 11/10/2016 0901   LABSPEC 1.014 06/11/2020 2037   PHURINE 6.0 06/11/2020 2037   GLUCOSEU NEGATIVE 06/03/2020 2037   HGBUR MODERATE (A) 06/15/2020 2037   BILIRUBINUR NEGATIVE 06/09/2020 2037   BILIRUBINUR Negative 11/10/2016 0901   KETONESUR NEGATIVE 06/15/2020 2037   PROTEINUR NEGATIVE 06/20/2020 2037   NITRITE NEGATIVE 06/18/2020 2037   LEUKOCYTESUR MODERATE (A) 06/18/2020 2037    Radiological Exams on Admission: DG Chest 2 View  Result Date: 06/03/2020 CLINICAL DATA:  Suspect sepsis. EXAM: CHEST - 2 VIEW COMPARISON:  03/25/2019 FINDINGS: Heart size and vascularity normal. Mild bibasilar atelectasis. Small right effusion. Elevated left hemidiaphragm with bowel gas below the left diaphragm. Mild hyperinflation lungs. No mass lesion. No acute skeletal abnormality. IMPRESSION: Mild bibasilar atelectasis and small right effusion.   COPD. Electronically Signed   By: Franchot Gallo M.D.   On: 05/27/2020 13:35   CT Head Wo Contrast  Result Date: 06/16/2020 CLINICAL DATA:  Altered mental status. EXAM: CT HEAD WITHOUT CONTRAST TECHNIQUE: Contiguous axial images were obtained from the base of the skull through  the vertex without intravenous contrast. COMPARISON:  March 16, 2018 FINDINGS: Brain: There is mild cerebral atrophy with widening of the extra-axial spaces and ventricular dilatation. There are areas of decreased attenuation within the white matter tracts of the supratentorial brain, consistent with microvascular disease changes. Vascular: No hyperdense vessel or  unexpected calcification. Skull: Normal. Negative for fracture or focal lesion. Sinuses/Orbits: Very mild, bilateral ethmoid sinus mucosal thickening is seen. Other: None. IMPRESSION: 1. Generalized cerebral atrophy. 2. Very mild, bilateral ethmoid sinus disease. 3. No acute intracranial abnormality. Electronically Signed   By: Virgina Norfolk M.D.   On: 06/20/2020 21:11   CT ABDOMEN PELVIS W CONTRAST  Result Date: 06/06/2020 CLINICAL DATA:  Abdominal pain. EXAM: CT ABDOMEN AND PELVIS WITH CONTRAST TECHNIQUE: Multidetector CT imaging of the abdomen and pelvis was performed using the standard protocol following bolus administration of intravenous contrast. CONTRAST:  141mL OMNIPAQUE IOHEXOL 300 MG/ML  SOLN COMPARISON:  None. FINDINGS: Lower chest: A trace amount of atelectasis is seen within the right lung base. A very small right pleural effusion is noted. Hepatobiliary: No focal liver abnormality is seen. Status post cholecystectomy. No biliary dilatation. Pancreas: Unremarkable. No pancreatic ductal dilatation or surrounding inflammatory changes. Spleen: Normal in size without focal abnormality. Adrenals/Urinary Tract: Adrenal glands are unremarkable. Kidneys are normal, without renal calculi, focal lesion, or hydronephrosis. Bladder is unremarkable. Stomach/Bowel: Stomach is within normal limits. Appendix appears normal. No evidence of bowel dilatation. There is marked severity thickening of the rectum and adjacent portion of the distal sigmoid colon (axial CT images 71 through 75, CT series number 2). This is slightly asymmetric in appearance,  right greater than left. No associated inflammatory fat stranding is seen. Vascular/Lymphatic: There is marked severity calcification and atherosclerosis of the abdominal aorta and bilateral common iliac arteries, without evidence of aneurysmal dilatation. No enlarged abdominal or pelvic lymph nodes. Reproductive: The prostate gland is mildly enlarged. Other: No abdominal wall hernia or abnormality. No abdominopelvic ascites. Musculoskeletal: Moderate to marked severity multilevel degenerative changes seen throughout the lumbar spine. IMPRESSION: 1. Asymmetric of the rectum and adjacent portion of the sigmoid colon, which may be secondary to an underlying mass. Correlation with physical examination and MRI is recommended. 2. Evidence of prior cholecystectomy. 3. Very small right pleural effusion. 4. Mildly enlarged prostate gland. 5. Moderate to marked severity multilevel degenerative changes throughout the lumbar spine. 6. Aortic atherosclerosis. Aortic Atherosclerosis (ICD10-I70.0). Electronically Signed   By: Virgina Norfolk M.D.   On: 05/28/2020 21:22     Assessment/Plan 85 year old male with history of paroxysmal A. fib on apixaban, HTN, CAD, frequent UTIs, COPD and pulmonary fibrosis with chronic respiratory failure on home O2 at 3 L presenting with confusion and " talking out of his head".     Acute metabolic encephalopathy   UTI  in the setting of history of frequent UTIs Possible sepsis -Patient presents with acute confusion likely related to UTI. CT head with no acute intracranial findings, and patient has no focal deficits -Patient does not meet sepsis criteria at this time. No fever or tachycardia though with elevated WBC and lactic acid of 2, but patient did complete a course of Cipro a couple weeks prior -IV Rocephin, IV fluids -Follow cultures -Neurologic checks, fall and aspiration precautions -Monitor for development of sepsis     Pulmonary fibrosis (HCC)   Chronic respiratory  failure with hypoxia (HCC)   COPD, severe ([place]) -Not acutely exacerbated -Continue home inhalers. DuoNeb as needed -Continue home oxygen    CAD (coronary artery disease) -No complaints of chest pain and EKG is nonacute -Continue metoprolol, nitroglycerin, not currently on Zetia    HTN (hypertension) -Diltiazem    PAF (paroxysmal atrial fibrillation) (HCC) -Continue apixaban, metoprolol    Abnormal computed tomography angiography (CTA) of abdomen and pelvis -Abnormality rectosigmoid area -  Consider GI consult    DVT prophylaxis: Apixaban Code Status: full code  Family Communication:  none  Disposition Plan: Back to previous home environment Consults called: none  Status:At the time of admission, it appears that the appropriate admission status for this patient is INPATIENT. This is judged to be reasonable and necessary in order to provide the required intensity of service to ensure the patient's safety given the presenting symptoms, physical exam findings, and initial radiographic and laboratory data in the context of their  Comorbid conditions.   Patient requires inpatient status due to high intensity of service, high risk for further deterioration and high frequency of surveillance required.   I certify that at the point of admission it is my clinical judgment that the patient will require inpatient hospital care spanning beyond [place] MD [facility]     05/23/2020, 10:31 PM

## 2020-06-21 NOTE — ED Notes (Signed)
MD at bedside. 

## 2020-06-21 NOTE — ED Notes (Signed)
MD updating pts wife over the phone at this time. Pt aware wife has been updated.

## 2020-06-21 NOTE — ED Triage Notes (Signed)
t arrives to ed via ems from home. Ems reports pt woke up this morning thinking he was somewhere else. Pt alert and oriented to self and place. Unable to state correct year. Pt reports frequent falls and increase weakness. Wife reported to ems concerns for uti due to frequent urination and strong smells. Pt on oxygen 3L [place] at home and on arrival. Just finished 2 week antibiotic 3 days ago for UTI.

## 2020-06-21 NOTE — ED Provider Notes (Signed)
[facility] Emergency Department Provider Note ____________________________________________   None    (approximate)  I have reviewed the triage vital signs and the nursing notes.  HISTORY  Chief Complaint Altered Mental Status and Urinary Tract Infection   HPI Eric Li is a 85 y.o. malewho presents to the ED for evaluation of confusion.  Chart review indicates anticoagulated on Eliquis.  History of CAD, PAD, COPD, HTN, HLD, CHF, and A. fib. Chronic hypoxic respiratory failure on 3 L home O2.    Due to staffing shortages and medical admit holds in the ED, patient waits in the waiting room for approximately 8 hours prior to my initial evaluation.  Patient is unable to provide any meaningful history due to his disorientation and altered mental status.  He seems to be paranoid, frequently shifting our conversation and asking if I can help him.  He cannot elaborate on what he needs help with.  When asked if he has abdominal pain, he says yes.  When asked if he has back pain, he says yes.  Denies pain elsewhere.  He is uncertain where he is and what year it is.  11 of history is provided by wife and daughter over the phone.  They report that for the past 36 hours, he has been "talking out of his head "and not acting himself.  They report paranoia at home, foul-smelling urine, and him asked like this previously during complicated UTI.  He has never seen a urologist.  His PCP is managing this.  He is fully vaccinated for COVID-19 and has an upcoming appointment to get his booster shot.    Past Medical History:  Diagnosis Date  . Bilateral hearing loss 03/03/2016  . CAD (coronary artery disease) 02/24/2014  . CHF (congestive heart failure) ([place])   . COPD, moderate ([place]) 08/29/2015  . Hemorrhoids 02/24/2014  . HTN (hypertension) 02/24/2014  . Hyperlipidemia, unspecified 02/24/2014  . Nephrolithiasis   . SOB (shortness of breath) on exertion 03/27/2015  . Statin  intolerance 03/30/2015    Patient Active Problem List   Diagnosis Date Noted  . UTI (urinary tract infection) 06/13/2020  . Acute metabolic encephalopathy 16/06/9603  . Abnormal computed tomography angiography (CTA) of abdomen and pelvis 05/28/2020  . Pulmonary fibrosis ([place]) 02/08/2020  . Chronic respiratory failure with hypoxia ([place]) 02/08/2020  . PAF (paroxysmal atrial fibrillation) ([place]) 10/14/2019  . GAD (generalized anxiety disorder) 10/14/2019  . Chest pain 03/25/2019  . Elevated PSA 03/18/2019  . Atrial flutter ([place]) 11/08/2018  . Memory problem 09/09/2018  . Psychophysiologic insomnia 06/22/2018  . Carotid stenosis 04/02/2018  . PAD (peripheral artery disease) ([place]) 04/02/2018  . Syncope 03/15/2018  . Bilateral hearing loss 03/03/2016  . COPD, moderate ([place]) 08/29/2015  . COPD, severe ([place]) 08/29/2015  . Statin intolerance 03/30/2015  . SOB (shortness of breath) on exertion 03/27/2015  . CAD (coronary artery disease) 02/24/2014  . Hemorrhoids 02/24/2014  . HTN (hypertension) 02/24/2014  . Hyperlipidemia, unspecified 02/24/2014    Past Surgical History:  Procedure Laterality Date  . CHOLECYSTECTOMY    . TONSILLECTOMY      Prior to Admission medications   Medication Sig Start Date End Date Taking? Authorizing Provider  acyclovir cream (ZOVIRAX) 5 % Apply 1 Application topically 5 (five) times daily   Yes [provider]  albuterol (PROVENTIL) (2.5 MG/3ML) 0.083% nebulizer solution Take 2.5 mg by nebulization every 6 (six) hours as needed for wheezing or shortness of breath.   Yes [provider]  apixaban (ELIQUIS) 2.5 MG TABS tablet Take 2.5 mg by mouth 2 (two) times daily.   Yes [provider]  calcium carbonate (TUMS - DOSED IN MG ELEMENTAL CALCIUM) 500 MG chewable tablet Chew 1 tablet by mouth daily as needed for indigestion or heartburn.   Yes [provider]  diltiazem (CARDIZEM CD) 360 MG 24 hr capsule Take 360 mg by mouth  daily.   Yes [provider]  escitalopram (LEXAPRO) 5 MG tablet Take 10 mg by mouth daily.  03/18/19  Yes [provider]  ezetimibe (ZETIA) 10 MG tablet Take 10 mg by mouth every Monday, Wednesday, and Friday.    Yes [provider]  fluticasone (FLONASE) 50 MCG/ACT nasal spray Place 2 sprays into both nostrils daily.   Yes [provider]  Fluticasone-Salmeterol (ADVAIR DISKUS) 250-50 MCG/DOSE AEPB INHALE 1 INHALATION INTO THE LUNGS EVERY 12 (TWELVE) HOURS 07/21/19  Yes [provider]  furosemide (LASIX) 20 MG tablet Take 10 mg by mouth every other day.    Yes [provider]  ipratropium (ATROVENT) 0.03 % nasal spray Place 2 sprays into both nostrils 3 (three) times daily as needed for rhinitis (runny nose).   Yes [provider]  metoprolol succinate (TOPROL-XL) 50 MG 24 hr tablet Take 50 mg by mouth daily.    Yes [provider]  mometasone (ELOCON) 0.1 % lotion INSTILL 4 DROPS IN EACH EAR AT BEDTIME AS NEEDED FOR DRY SKIN AND ITCHING   Yes [provider]  Multiple Vitamin (MULTIVITAMIN WITH MINERALS) TABS tablet Take 1 tablet by mouth daily.   Yes [provider]  nitroGLYCERIN (NITROSTAT) 0.4 MG SL tablet Place 0.4 mg under the tongue every 5 (five) minutes as needed for chest pain.   Yes [provider]  pantoprazole (PROTONIX) 40 MG tablet Take 40 mg by mouth daily.  09/21/19  Yes [provider]  predniSONE (DELTASONE) 10 MG tablet Take 10 mg by mouth daily with breakfast.   Yes [provider]  psyllium (HYDROCIL/METAMUCIL) 95 % PACK Take 1 packet by mouth daily as needed for mild constipation or moderate constipation.   Yes [provider]  tiZANidine (ZANAFLEX) 4 MG tablet Take 4 mg by mouth at bedtime.  07/22/19  Yes [provider]  zolpidem (AMBIEN) 5 MG tablet TAKE 1 TABLET BY MOUTH EVERY DAY AT BEDTIME AS NEEDED FOR SLEEP 07/04/19  Yes [provider]  mirtazapine (REMERON) 7.5 MG tablet Take by mouth. 07/22/19 05/26/20  [provider]    Allergies Simvastatin  Family History  Problem Relation Age of Onset  . Bladder Cancer Neg Hx   . Prolactinoma Neg Hx   . Prostate cancer Neg Hx   . Kidney cancer Neg Hx     Social History Social History   Tobacco Use  . Smoking status: Former Research scientist (life sciences)  . Smokeless tobacco: Never Used  Vaping Use  . Vaping Use: Never used  Substance Use Topics  . Alcohol use: No  . Drug use: No    Review of Systems  Unable to be accurately assessed due to patient's altered mental status and disorientation ____________________________________________   PHYSICAL EXAM:  VITAL SIGNS: Vitals:   06/06/2020 1854 06/12/2020 2033  BP: (!) 123/99 (!) 141/87  Pulse: 87 90  Resp: 16 18  Temp: 98 F (36.7 C)   SpO2: 97% 93%      Constitutional: Alert and oriented. Well appearing and in no acute distress. Eyes: Conjunctivae  are normal. PERRL. EOMI. Head: Atraumatic. Nose: No congestion/rhinnorhea. Mouth/Throat: Mucous membranes are moist.  Oropharynx non-erythematous. Neck: No stridor. No cervical spine tenderness to palpation. Cardiovascular: Normal rate, regular rhythm. Grossly normal heart sounds.  Good peripheral circulation. Respiratory: Normal respiratory effort.  No retractions. Lungs CTAB. Gastrointestinal: Soft , nondistended. No abdominal bruits. No CVA tenderness. Diffuse tenderness to palpation without peritoneal signs Musculoskeletal: No lower extremity tenderness nor edema.  No joint effusions.  Scattered superficial abrasions and bruises to his dorsal forearms bilaterally. Full palpation of all 4 extremities without evidence of deformity or areas of tenderness. Full active and passive ROM of bilateral shoulders, elbows, knees and hips.  No hip pain with logrolling. No signs of trauma to the back or spinal tenderness.  No spinal step-offs. Neurologic:  Normal speech  and language. No gross focal neurologic deficits are appreciated. Cranial nerves II through XII intact 5/5 strength and sensation in all 4 extremities Skin:  Skin is warm, dry and intact. No rash noted. Psychiatric: Mood and affect are normal. Speech and behavior are normal.  ____________________________________________   LABS (all labs ordered are listed, but only abnormal results are displayed)  Labs Reviewed  COMPREHENSIVE METABOLIC PANEL - Abnormal; Notable for the following components:      Result Value   Calcium 8.8 (*)    Total Protein 6.3 (*)    Albumin 3.1 (*)    All other components within normal limits  LACTIC ACID, PLASMA - Abnormal; Notable for the following components:   Lactic Acid, Venous 2.0 (*)    All other components within normal limits  CBC WITH DIFFERENTIAL/PLATELET - Abnormal; Notable for the following components:   WBC 11.7 (*)    RDW 16.5 (*)    Neutro Abs 8.2 (*)    Monocytes Absolute 1.2 (*)    Abs Immature Granulocytes 0.20 (*)    All other components within normal limits  PROTIME-INR - Abnormal; Notable for the following components:   INR 1.3 (*)    All other components within normal limits  URINALYSIS, COMPLETE (UACMP) WITH MICROSCOPIC - Abnormal; Notable for the following components:   Color, Urine YELLOW (*)    APPearance HAZY (*)    Hgb urine dipstick MODERATE (*)    Leukocytes,Ua MODERATE (*)    Bacteria, UA FEW (*)    All other components within normal limits  RESPIRATORY PANEL BY RT PCR (FLU A&B, COVID)  CULTURE, BLOOD (ROUTINE X 2)  CULTURE, BLOOD (ROUTINE X 2)  URINE CULTURE  LACTIC ACID, PLASMA  BASIC METABOLIC PANEL  CBC   ____________________________________________  12 Lead EKG  Sinus rhythm, rate of 71 bpm.  Normal axis.  First-degree AV block with PR interval of 262 ms, right bundle branch block.  No evidence of acute ischemia.  Similar to previous on 8/30 ____________________________________________  RADIOLOGY  ED MD  interpretation: 2 view CXR reviewed with minimal right-sided pleural effusion, otherwise clear CT reviewed not evidence of acute ICH or other acute intracranial pathology.   Official radiology report(s): DG Chest 2 View  Result Date: 06/15/2020 CLINICAL DATA:  Suspect sepsis. EXAM: CHEST - 2 VIEW COMPARISON:  03/25/2019 FINDINGS: Heart size and vascularity normal. Mild bibasilar atelectasis. Small right effusion. Elevated left hemidiaphragm with bowel gas below the left diaphragm. Mild hyperinflation lungs. No mass lesion. No acute skeletal abnormality. IMPRESSION: Mild bibasilar atelectasis and small right effusion.   COPD. Electronically Signed   By: Franchot Gallo M.D.   On: 06/01/2020 13:35   CT Head  Wo Contrast  Result Date: 06/17/2020 CLINICAL DATA:  Altered mental status. EXAM: CT HEAD WITHOUT CONTRAST TECHNIQUE: Contiguous axial images were obtained from the base of the skull through the vertex without intravenous contrast. COMPARISON:  March 16, 2018 FINDINGS: Brain: There is mild cerebral atrophy with widening of the extra-axial spaces and ventricular dilatation. There are areas of decreased attenuation within the white matter tracts of the supratentorial brain, consistent with microvascular disease changes. Vascular: No hyperdense vessel or unexpected calcification. Skull: Normal. Negative for fracture or focal lesion. Sinuses/Orbits: Very mild, bilateral ethmoid sinus mucosal thickening is seen. Other: None. IMPRESSION: 1. Generalized cerebral atrophy. 2. Very mild, bilateral ethmoid sinus disease. 3. No acute intracranial abnormality. Electronically Signed   By: Virgina Norfolk M.D.   On: 06/08/2020 21:11   CT ABDOMEN PELVIS W CONTRAST  Result Date: 06/02/2020 CLINICAL DATA:  Abdominal pain. EXAM: CT ABDOMEN AND PELVIS WITH CONTRAST TECHNIQUE: Multidetector CT imaging of the abdomen and pelvis was performed using the standard protocol following bolus administration of intravenous  contrast. CONTRAST:  110mL OMNIPAQUE IOHEXOL 300 MG/ML  SOLN COMPARISON:  None. FINDINGS: Lower chest: A trace amount of atelectasis is seen within the right lung base. A very small right pleural effusion is noted. Hepatobiliary: No focal liver abnormality is seen. Status post cholecystectomy. No biliary dilatation. Pancreas: Unremarkable. No pancreatic ductal dilatation or surrounding inflammatory changes. Spleen: Normal in size without focal abnormality. Adrenals/Urinary Tract: Adrenal glands are unremarkable. Kidneys are normal, without renal calculi, focal lesion, or hydronephrosis. Bladder is unremarkable. Stomach/Bowel: Stomach is within normal limits. Appendix appears normal. No evidence of bowel dilatation. There is marked severity thickening of the rectum and adjacent portion of the distal sigmoid colon (axial CT images 71 through 75, CT series number 2). This is slightly asymmetric in appearance, right greater than left. No associated inflammatory fat stranding is seen. Vascular/Lymphatic: There is marked severity calcification and atherosclerosis of the abdominal aorta and bilateral common iliac arteries, without evidence of aneurysmal dilatation. No enlarged abdominal or pelvic lymph nodes. Reproductive: The prostate gland is mildly enlarged. Other: No abdominal wall hernia or abnormality. No abdominopelvic ascites. Musculoskeletal: Moderate to marked severity multilevel degenerative changes seen throughout the lumbar spine. IMPRESSION: 1. Asymmetric of the rectum and adjacent portion of the sigmoid colon, which may be secondary to an underlying mass. Correlation with physical examination and MRI is recommended. 2. Evidence of prior cholecystectomy. 3. Very small right pleural effusion. 4. Mildly enlarged prostate gland. 5. Moderate to marked severity multilevel degenerative changes throughout the lumbar spine. 6. Aortic atherosclerosis. Aortic Atherosclerosis (ICD10-I70.0). Electronically Signed   By:  Virgina Norfolk M.D.   On: 06/11/2020 21:22    ____________________________________________   PROCEDURES and INTERVENTIONS  Procedure(s) performed (including Critical Care):  Procedures  Medications  diltiazem (CARDIZEM CD) 24 hr capsule 360 mg (has no administration in time range)  ezetimibe (ZETIA) tablet 10 mg (has no administration in time range)  furosemide (LASIX) tablet 10 mg (has no administration in time range)  metoprolol succinate (TOPROL-XL) 24 hr tablet 50 mg (has no administration in time range)  nitroGLYCERIN (NITROSTAT) SL tablet 0.4 mg (has no administration in time range)  escitalopram (LEXAPRO) tablet 10 mg (has no administration in time range)  zolpidem (AMBIEN) tablet 5 mg (has no administration in time range)  predniSONE (DELTASONE) tablet 10 mg (has no administration in time range)  pantoprazole (PROTONIX) EC tablet 40 mg (has no administration in time range)  psyllium (HYDROCIL/METAMUCIL) 1 packet (has no administration  in time range)  apixaban (ELIQUIS) tablet 2.5 mg (has no administration in time range)  multivitamin with minerals tablet 1 tablet (has no administration in time range)  albuterol (PROVENTIL) (2.5 MG/3ML) 0.083% nebulizer solution 2.5 mg (has no administration in time range)  fluticasone (FLONASE) 50 MCG/ACT nasal spray 2 spray (has no administration in time range)  mometasone-formoterol (DULERA) 200-5 MCG/ACT inhaler 2 puff (has no administration in time range)  0.9 %  sodium chloride infusion (has no administration in time range)  acetaminophen (TYLENOL) tablet 650 mg (has no administration in time range)    Or  acetaminophen (TYLENOL) suppository 650 mg (has no administration in time range)  morphine 2 MG/ML injection 2 mg (has no administration in time range)  ondansetron (ZOFRAN) tablet 4 mg (has no administration in time range)    Or  ondansetron (ZOFRAN) injection 4 mg (has no administration in time range)  cefTRIAXone (ROCEPHIN) 1  g in sodium chloride 0.9 % 100 mL IVPB (has no administration in time range)  sodium chloride 0.9 % bolus 500 mL (0 mLs Intravenous Stopped 05/29/2020 2214)  iohexol (OMNIPAQUE) 300 MG/ML solution 100 mL (100 mLs Intravenous Contrast Given 06/19/2020 2053)  cefTRIAXone (ROCEPHIN) 2 g in sodium chloride 0.9 % 100 mL IVPB (2 g Intravenous New Bag/Given 06/09/2020 2214)    ____________________________________________   MDM / ED COURSE  85 year old male presents from home with paranoia and confusion, most consistent with a complicated UTI, and requiring medical observation admission.  Soft pressures noted in triage, the self resolved prior to fluid administration, and vitals are otherwise normal on room air.  Exam demonstrates some vague and mild diffuse abdominal tenderness without peritoneal or localizing features.  He is pleasant, disoriented and a little paranoid.  No suicidality, mania.  Blood work demonstrates mild lactic acidosis and leukocytosis.  His urine shows infectious features.  Blood and urine cultures drawn and patient was provided Rocephin for antibiotic coverage.  I believe his encephalopathy is metabolic in etiology due to his UTI.  Due to his abdominal tenderness and lactic acidosis, CT imaging obtained of his abdomen/pelvis and demonstrates asymmetric thickening of his rectum concerning for possible underlying malignancy.  This may be the source of why he has a UTI to begin with, causing retention.  Discussed this with the hospitalist about the possibility of cancer and the need for further work-up.  Will admit the patient to hospitalist medicine for further work-up and management.  Clinical Course as of Jun 21 2248  Thu Jun 21, 2020  2028 Called wife, Nyoka Cowden,  Talking crazy. Didn't know who people were. New paranoia.  UTI in the beginning of sept. He's had several UTIs.    [DS]  2031 Got cipro then.  Fallen 5 times in past month.  Complains of back pain chronically   [DS]  2216 Updated  wife and granddaughter over the phone about complicated UTI, admission and antibiotics. We further discussed possibility of colon cancer.  We talked about CT findings and thickening around his rectum.   [DS]    Clinical Course User Index [DS] Vladimir Crofts, MD     ____________________________________________   FINAL CLINICAL IMPRESSION(S) / ED DIAGNOSES  Final diagnoses:  Complicated UTI (urinary tract infection)  Paranoia ([place])  Lactic acidosis     ED Discharge Orders    None       Mailin [place]   Note:  This document was prepared using Dragon voice recognition software and may include unintentional dictation errors.  Vladimir Crofts, MD 06/01/2020 2251

## 2020-06-22 DIAGNOSIS — J9611 Chronic respiratory failure with hypoxia: Secondary | ICD-10-CM | POA: Diagnosis not present

## 2020-06-22 LAB — BASIC METABOLIC PANEL
Anion gap: 13 (ref 5–15)
BUN: 11 mg/dL (ref 8–23)
CO2: 20 mmol/L — ABNORMAL LOW (ref 22–32)
Calcium: 8.3 mg/dL — ABNORMAL LOW (ref 8.9–10.3)
Chloride: 107 mmol/L (ref 98–111)
Creatinine, Ser: 0.65 mg/dL (ref 0.61–1.24)
GFR calc Af Amer: >60 mL/min (ref >60)
GFR calc non Af Amer: >60 mL/min (ref >60)
Glucose, Bld: 89 mg/dL (ref 70–99)
Potassium: 3.6 mmol/L (ref 3.5–5.1)
Sodium: 140 mmol/L (ref 135–145)

## 2020-06-22 LAB — CBC
HCT: 43.7 % (ref 39.0–52.0)
Hemoglobin: 14.1 g/dL (ref 13.0–17.0)
MCH: 29.4 pg (ref 26.0–34.0)
MCHC: 32.3 g/dL (ref 30.0–36.0)
MCV: 91 fL (ref 80.0–100.0)
Platelets: 216 10*3/uL (ref 150–400)
RBC: 4.8 MIL/uL (ref 4.22–5.81)
RDW: 16.3 % — ABNORMAL HIGH (ref 11.5–15.5)
WBC: 14.1 10*3/uL — ABNORMAL HIGH (ref 4.0–10.5)
nRBC: 0 % (ref 0.0–0.2)

## 2020-06-22 MED ORDER — RISAQUAD PO CAPS
1.0000 | ORAL_CAPSULE | Freq: Every day | ORAL | Status: DC
  Administered 2020-06-22 – 2020-07-09 (×18): 1 via ORAL
  Filled 2020-06-22 (×19): qty 1

## 2020-06-22 MED ORDER — TRAZODONE HCL 50 MG PO TABS
50.0000 mg | ORAL_TABLET | Freq: Every evening | ORAL | Status: DC | PRN
  Administered 2020-06-22 – 2020-07-07 (×14): 50 mg via ORAL
  Filled 2020-06-22 (×14): qty 1

## 2020-06-22 MED ORDER — RISAQUAD PO CAPS
2.0000 | ORAL_CAPSULE | Freq: Three times a day (TID) | ORAL | Status: DC
  Filled 2020-06-22: qty 2

## 2020-06-22 NOTE — ED Notes (Signed)
New oxygen tank placed on back of patient's bed. Placed on 3L via [place].

## 2020-06-22 NOTE — ED Notes (Signed)
Pt continues to be very altered at this time. Pt continues to call RN over to urinate and has to be reminded of condom cath. Pt then repeatedly asks where his IV is and how it is connected. Pt then started to ask RN, "where are the ants coming from" Pt pointing at the wall and ceiling. Pt reassured there are no ants and pt responded by laughing and then asking about the Spo2 monitor again. Pt assisted in rolling over and then covered back with the blankets.

## 2020-06-22 NOTE — Progress Notes (Addendum)
PROGRESS NOTE    Patient: Eric Li                            PCP: [name], MD                    DOB: Feb 06, 1935            DOA: 06/08/2020 [id number]:[id number]             DOS: 06/22/2020, 7:32 AM   LOS: 1 day   Date of Service: The patient was seen and examined on 06/22/2020  Subjective:   The patient was seen and examined this Am. Stable, remain confused, no signs of agitation Otherwise no issues overnight .  Brief Narrative:   Eric Li is a27 year old male with history of paroxysmal A. fib on apixaban, HTN, CAD, frequent UTIs, COPD and pulmonary fibrosis with chronic respiratory failure on home O2 at 3 L presenting with confusion and " talking out of his head".  Admitted for altered mental status likely due to UTI.    ED Course:  Vitals were within normal limits  Blood work was significant for WBC of 11,700 and lactic acid of 2>>1.6. Urinalysis showed pyuria.  EKG as reviewed by me : NSR with RBBB with nonspecific ST-T wave changes Chest x-ray showed no acute findings. Head CT no acute findings.  Due to patient complaint of abdominal pain CT abdomen and pelvis with contrast was done which showed asymmetry of the rectum and adjacent portion of the sigmoid colon.  Patient was started on Rocephin    Assessment & Plan:   Principal Problem:   Acute metabolic encephalopathy Active Problems:   CAD (coronary artery disease)   HTN (hypertension)   UTI (urinary tract infection)   PAF (paroxysmal atrial fibrillation) (HCC)   Pulmonary fibrosis (HCC)   Chronic respiratory failure with hypoxia (HCC)   COPD, severe (HCC)   Abnormal computed tomography angiography (CTA) of abdomen and pelvis     Acute metabolic encephalopathy.  Likely due to UTI  (h/o frequent UTIs) On admission meets SIRS criteria, sepsis - was ruled out -Hemodynamics stable this morning, SARS etiology resolved -Sepsis ruled out -Confusion improving  CT head with no acute intracranial  findings, and patient has no focal deficits -Patient did not meet sepsis criteria at this time. No fever or tachycardia though with elevated WBC and lactic acid of 2, but patient did complete a course of Cipro a couple weeks prior -Lactic acid 2.0 >> 1.6 -IV Rocephin, IV fluids -Follow cultures -Neurologic checks, fall and aspiration precautions -Monitor for development of sepsis     Pulmonary fibrosis (HCC)/ Chronic respiratory failure with hypoxia (HCC)/ COPD, severe (HCC) -Satting 95% 3 L of oxygen by nasal cannula -No signs of exacerbation-at baseline -Continue home inhalers. DuoNeb as needed -Stable     CAD (coronary artery disease) -stable, denies any chest pain -No EKG changes -Continue metoprolol, nitroglycerin, not currently on Zetia    HTN (hypertension) -Diltiazem Stable, as needed hydralazine    PAF (paroxysmal atrial fibrillation) (HCC) -Continue apixaban, metoprolol - Stable     Abnormal computed tomography angiography (CTA) of abdomen and pelvis -Abnormality rectosigmoid area -Consider GI consult ????    DVT prophylaxis: Apixaban Code Status: full code  Family Communication:  none  Disposition Plan: Back to previous home environment Consults called: none  Status:At the time of admission, it appears that the appropriate admission status for  this patient is INPATIENT     Nutritional status:         Cultures; Blood Cultures x 2 >> NGT Sputum Culture >> NGT   Antimicrobials: 06/22/2020 IV Rocephin     Admission status:    Status is: Inpatient  Remains inpatient appropriate because:Inpatient level of care appropriate due to severity of illness   Dispo: The patient is from: ALF              Anticipated d/c is to: ALF              Anticipated d/c date is: 2 days              Patient currently is not medically stable to d/c.        Procedures:   No admission procedures for hospital encounter.     Antimicrobials:   Anti-infectives (From admission, onward)   Start     Dose/Rate Route Frequency Ordered Stop   06/22/20 2200  cefTRIAXone (ROCEPHIN) 1 g in sodium chloride 0.9 % 100 mL IVPB        1 g 200 mL/hr over 30 Minutes Intravenous Every 24 hours 05/23/2020 2248     06/16/2020 2145  cefTRIAXone (ROCEPHIN) 2 g in sodium chloride 0.9 % 100 mL IVPB        2 g 200 mL/hr over 30 Minutes Intravenous  Once 06/09/2020 2142 05/28/2020 2249       Medication:  . apixaban  2.5 mg Oral BID  . diltiazem  360 mg Oral Daily  . escitalopram  10 mg Oral Daily  . ezetimibe  10 mg Oral Q M,W,F  . fluticasone  2 spray Each Nare Daily  . [START ON 06/23/2020] furosemide  10 mg Oral QODAY  . metoprolol succinate  50 mg Oral Daily  . mometasone-formoterol  2 puff Inhalation BID  . multivitamin with minerals  1 tablet Oral Daily  . pantoprazole  40 mg Oral Daily  . predniSONE  10 mg Oral Q breakfast    acetaminophen **OR** acetaminophen, albuterol, morphine injection, nitroGLYCERIN, ondansetron **OR** ondansetron (ZOFRAN) IV, psyllium, zolpidem   Objective:   Vitals:   06/22/20 0400 06/22/20 0500 06/22/20 0526 06/22/20 0630  BP: (!) 152/92 (!) 156/85 (!) 149/84 (!) 159/100  Pulse: 90 88 92 90  Resp:  16 17 19   Temp:   (!) 97.5 F (36.4 C)   TempSrc:      SpO2: 92% 94% 94% 95%  Weight:      Height:        Intake/Output Summary (Last 24 hours) at 06/22/2020 0732 Last data filed at 06/04/2020 2249 Gross per 24 hour  Intake 600 ml  Output --  Net 600 ml   Filed Weights   05/31/2020 1237  Weight: 53.5 kg     Examination:   Physical Exam  Constitution:  Alert, cooperative, no distress,  Appears calm and comfortable, confused at baseline Psychiatric: Normal and stable mood and affect, cognition intact,   HEENT: Normocephalic, PERRL, otherwise with in Normal limits  Chest:Chest symmetric Cardio vascular:  S1/S2, RRR, No murmure, No Rubs or Gallops  pulmonary: Clear to auscultation bilaterally,  respirations unlabored, negative wheezes / crackles Abdomen: Soft, non-tender, non-distended, bowel sounds,no masses, no organomegaly Muscular skeletal: Limited exam - in bed, able to move all 4 extremities, Normal strength,  Neuro: CNII-XII intact. , normal motor and sensation, reflexes intact  Extremities: No pitting edema lower extremities, +2 pulses  Skin: Dry, warm to touch,  negative for any Rashes, No open wounds Wounds: per nursing documentation    ------------------------------------------------------------------------------------------------------------------------------------------    LABs:  CBC Latest Ref Rng & Units 06/22/2020 05/28/2020 05/26/2020  WBC 4.0 - 10.5 K/uL 14.1(H) 11.7(H) 19.5(H)  Hemoglobin 13.0 - 17.0 g/dL 14.1 14.3 14.7  Hematocrit 39 - 52 % 43.7 42.0 45.0  Platelets 150 - 400 K/uL 216 201 189   CMP Latest Ref Rng & Units 06/22/2020 06/19/2020 05/26/2020  Glucose 70 - 99 mg/dL 89 91 141(H)  BUN 8 - 23 mg/dL 11 10 17   Creatinine 0.61 - 1.24 mg/dL 0.65 0.68 0.75  Sodium 135 - 145 mmol/L 140 137 136  Potassium 3.5 - 5.1 mmol/L 3.6 3.6 3.9  Chloride 98 - 111 mmol/L 107 103 104  CO2 22 - 32 mmol/L 20(L) 25 21(L)  Calcium 8.9 - 10.3 mg/dL 8.3(L) 8.8(L) 8.7(L)  Total Protein 6.5 - 8.1 g/dL - 6.3(L) 6.4(L)  Total Bilirubin 0.3 - 1.2 mg/dL - 1.1 1.2  Alkaline Phos 38 - 126 U/L - 107 103  AST 15 - 41 U/L - 25 65(H)  ALT 0 - 44 U/L - 26 73(H)       Micro Results Recent Results (from the past 240 hour(s))  Culture, blood (Routine x 2)     Status: None (Preliminary result)   Collection Time: 06/18/2020  1:07 PM   Specimen: BLOOD LEFT ARM  Result Value Ref Range Status   Specimen Description BLOOD LEFT ARM  Final   Special Requests   Final    BOTTLES DRAWN AEROBIC AND ANAEROBIC Blood Culture results may not be optimal due to an excessive volume of blood received in culture bottles   Culture   Final    NO GROWTH < 24 HOURS Performed at [facility], [address], [place], [place] [address]    Report Status PENDING  Incomplete  Culture, blood (Routine x 2)     Status: None (Preliminary result)   Collection Time: 06/14/2020  8:37 PM   Specimen: BLOOD  Result Value Ref Range Status   Specimen Description BLOOD RIGHT ANTECUBITAL  Final   Special Requests   Final    BOTTLES DRAWN AEROBIC AND ANAEROBIC Blood Culture adequate volume   Culture   Final    NO GROWTH < 12 HOURS Performed at [facility], [address], [place], [place] [address]    Report Status PENDING  Incomplete  Respiratory Panel by RT PCR (Flu A&B, Covid) - Nasopharyngeal Swab     Status: None   Collection Time: 06/19/2020  8:46 PM   Specimen: Nasopharyngeal Swab  Result Value Ref Range Status   SARS Coronavirus 2 by RT PCR NEGATIVE NEGATIVE Final    Comment: (NOTE) SARS-CoV-2 target nucleic acids are NOT DETECTED.  The SARS-CoV-2 RNA is generally detectable in upper respiratoy specimens during the acute phase of infection. The lowest concentration of SARS-CoV-2 viral copies this assay can detect is 131 copies/mL. A negative result does not preclude SARS-Cov-2 infection and should not be used as the sole basis for treatment or other patient management decisions. A negative result may occur with  improper specimen collection/handling, submission of specimen other than nasopharyngeal swab, presence of viral mutation(s) within the areas targeted by this assay, and inadequate number of viral copies (<131 copies/mL). A negative result must be combined with clinical observations, patient history, and epidemiological information. The expected result is Negative.  Fact Sheet for Patients:  PinkCheek.be  Fact Sheet for Healthcare Providers:  GravelBags.it  This test is no t yet approved or cleared by the [place] and  has been authorized for detection and/or diagnosis of SARS-CoV-2 by FDA under  an Emergency Use Authorization (EUA). This EUA will remain  in effect (meaning this test can be used) for the duration of the COVID-19 declaration under Section 564(b)(1) of the Act, 21 U.S.C. section 360bbb-3(b)(1), unless the authorization is terminated or revoked sooner.     Influenza A by PCR NEGATIVE NEGATIVE Final   Influenza B by PCR NEGATIVE NEGATIVE Final    Comment: (NOTE) The Xpert Xpress SARS-CoV-2/FLU/RSV assay is intended as an aid in  the diagnosis of influenza from Nasopharyngeal swab specimens and  should not be used as a sole basis for treatment. Nasal washings and  aspirates are unacceptable for Xpert Xpress SARS-CoV-2/FLU/RSV  testing.  Fact Sheet for Patients: PinkCheek.be  Fact Sheet for Healthcare Providers: GravelBags.it  This test is not yet approved or cleared by the [place] FDA and  has been authorized for detection and/or diagnosis of SARS-CoV-2 by  FDA under an Emergency Use Authorization (EUA). This EUA will remain  in effect (meaning this test can be used) for the duration of the  Covid-19 declaration under Section 564(b)(1) of the Act, 21  U.S.C. section 360bbb-3(b)(1), unless the authorization is  terminated or revoked. Performed at [facility], [address], [place], [place] [address]     Radiology Reports DG Chest 2 View  Result Date: 06/15/2020 CLINICAL DATA:  Suspect sepsis. EXAM: CHEST - 2 VIEW COMPARISON:  03/25/2019 FINDINGS: Heart size and vascularity normal. Mild bibasilar atelectasis. Small right effusion. Elevated left hemidiaphragm with bowel gas below the left diaphragm. Mild hyperinflation lungs. No mass lesion. No acute skeletal abnormality. IMPRESSION: Mild bibasilar atelectasis and small right effusion.   COPD. Electronically Signed   By: Franchot Gallo M.D.   On: 06/04/2020 13:35   CT Head Wo Contrast  Result Date: 06/11/2020 CLINICAL DATA:  Altered  mental status. EXAM: CT HEAD WITHOUT CONTRAST TECHNIQUE: Contiguous axial images were obtained from the base of the skull through the vertex without intravenous contrast. COMPARISON:  March 16, 2018 FINDINGS: Brain: There is mild cerebral atrophy with widening of the extra-axial spaces and ventricular dilatation. There are areas of decreased attenuation within the white matter tracts of the supratentorial brain, consistent with microvascular disease changes. Vascular: No hyperdense vessel or unexpected calcification. Skull: Normal. Negative for fracture or focal lesion. Sinuses/Orbits: Very mild, bilateral ethmoid sinus mucosal thickening is seen. Other: None. IMPRESSION: 1. Generalized cerebral atrophy. 2. Very mild, bilateral ethmoid sinus disease. 3. No acute intracranial abnormality. Electronically Signed   By: Virgina Norfolk M.D.   On: 05/28/2020 21:11   CT ABDOMEN PELVIS W CONTRAST  Result Date: 05/31/2020 CLINICAL DATA:  Abdominal pain. EXAM: CT ABDOMEN AND PELVIS WITH CONTRAST TECHNIQUE: Multidetector CT imaging of the abdomen and pelvis was performed using the standard protocol following bolus administration of intravenous contrast. CONTRAST:  127mL OMNIPAQUE IOHEXOL 300 MG/ML  SOLN COMPARISON:  None. FINDINGS: Lower chest: A trace amount of atelectasis is seen within the right lung base. A very small right pleural effusion is noted. Hepatobiliary: No focal liver abnormality is seen. Status post cholecystectomy. No biliary dilatation. Pancreas: Unremarkable. No pancreatic ductal dilatation or surrounding inflammatory changes. Spleen: Normal in size without focal abnormality. Adrenals/Urinary Tract: Adrenal glands are unremarkable. Kidneys are normal, without renal calculi, focal lesion, or hydronephrosis. Bladder is unremarkable. Stomach/Bowel: Stomach is within normal limits.  Appendix appears normal. No evidence of bowel dilatation. There is marked severity thickening of the rectum and adjacent  portion of the distal sigmoid colon (axial CT images 71 through 75, CT series number 2). This is slightly asymmetric in appearance, right greater than left. No associated inflammatory fat stranding is seen. Vascular/Lymphatic: There is marked severity calcification and atherosclerosis of the abdominal aorta and bilateral common iliac arteries, without evidence of aneurysmal dilatation. No enlarged abdominal or pelvic lymph nodes. Reproductive: The prostate gland is mildly enlarged. Other: No abdominal wall hernia or abnormality. No abdominopelvic ascites. Musculoskeletal: Moderate to marked severity multilevel degenerative changes seen throughout the lumbar spine. IMPRESSION: 1. Asymmetric of the rectum and adjacent portion of the sigmoid colon, which may be secondary to an underlying mass. Correlation with physical examination and MRI is recommended. 2. Evidence of prior cholecystectomy. 3. Very small right pleural effusion. 4. Mildly enlarged prostate gland. 5. Moderate to marked severity multilevel degenerative changes throughout the lumbar spine. 6. Aortic atherosclerosis. Aortic Atherosclerosis (ICD10-I70.0). Electronically Signed   By: Virgina Norfolk M.D.   On: 05/23/2020 21:22    SIGNED: Deatra James, MD, FACP, FHM. [facility],  Pager (please use [email or web address] to [email or web address])  If 7PM-7AM, please contact night-coverage [email or web address] Ota [facility] 06/22/2020, 7:32 AM

## 2020-06-22 NOTE — ED Notes (Signed)
Patient placed on bedpan per request. Patient unable to have BM. Patient taken back off of bedpan.   Patient placed on cardiac monitoring.

## 2020-06-22 NOTE — ED Notes (Signed)
Pt placed on bedpan and attempting to have a BM. Pt reporting to this RN that he has not had a BM in 3 days.

## 2020-06-22 NOTE — ED Notes (Signed)
New oxygen tank provided and Spo2 monitor replaced after patient had taken it off.

## 2020-06-22 DEATH — deceased

## 2020-06-23 DIAGNOSIS — J9611 Chronic respiratory failure with hypoxia: Secondary | ICD-10-CM | POA: Diagnosis not present

## 2020-06-23 LAB — BASIC METABOLIC PANEL
Anion gap: 12 (ref 5–15)
BUN: 23 mg/dL (ref 8–23)
CO2: 17 mmol/L — ABNORMAL LOW (ref 22–32)
Calcium: 8.6 mg/dL — ABNORMAL LOW (ref 8.9–10.3)
Chloride: 109 mmol/L (ref 98–111)
Creatinine, Ser: 0.74 mg/dL (ref 0.61–1.24)
GFR calc Af Amer: >60 mL/min (ref >60)
GFR calc non Af Amer: >60 mL/min (ref >60)
Glucose, Bld: 145 mg/dL — ABNORMAL HIGH (ref 70–99)
Potassium: 3.5 mmol/L (ref 3.5–5.1)
Sodium: 138 mmol/L (ref 135–145)

## 2020-06-23 LAB — URINE CULTURE: Culture: >=100000 — AB

## 2020-06-23 MED ORDER — FLEET ENEMA 7-19 GM/118ML RE ENEM
1.0000 | ENEMA | Freq: Once | RECTAL | Status: AC
  Administered 2020-06-23: 1 via RECTAL

## 2020-06-23 MED ORDER — FUROSEMIDE 10 MG/ML IJ SOLN
40.0000 mg | Freq: Once | INTRAMUSCULAR | Status: AC
  Administered 2020-06-23: 40 mg via INTRAVENOUS
  Filled 2020-06-23: qty 4

## 2020-06-23 MED ORDER — LORAZEPAM 2 MG/ML IJ SOLN: 1.0000 mg | Freq: Once | INTRAMUSCULAR | Status: DC

## 2020-06-23 MED ORDER — HALOPERIDOL LACTATE 5 MG/ML IJ SOLN
2.5000 mg | Freq: Once | INTRAMUSCULAR | Status: AC
  Administered 2020-06-23: 2.5 mg via INTRAVENOUS
  Filled 2020-06-23: qty 1

## 2020-06-23 NOTE — Progress Notes (Addendum)
PROGRESS NOTE    Patient: Eric Li                            PCP: [name], MD                    DOB: 1934-12-26            DOA: 06/20/2020 INO:[id number]             DOS: 06/23/2020, 10:48 AM   LOS: 2 days   Date of Service: The patient was seen and examined on 06/23/2020  Subjective:   The patient was seen and examined this morning, was sleeping easily arousable, remained confused questionable baseline   Brief Narrative:   KAMONTE MCMICHEN is a 85 year old male with history of paroxysmal A. fib on apixaban, HTN, CAD, frequent UTIs, COPD and pulmonary fibrosis with chronic respiratory failure on home O2 at 3 L presenting with confusion and " talking out of his head".  Admitted for altered mental status likely due to UTI.    ED Course:  Vitals were within normal limits  Blood work was significant for WBC of 11,700 and lactic acid of 2>>1.6. Urinalysis showed pyuria.  EKG as reviewed by me : NSR with RBBB with nonspecific ST-T wave changes Chest x-ray showed no acute findings. Head CT no acute findings.  Due to patient complaint of abdominal pain CT abdomen and pelvis with contrast was done which showed asymmetry of the rectum and adjacent portion of the sigmoid colon.  Patient was started on Rocephin    Assessment & Plan:   Principal Problem:   Acute metabolic encephalopathy Active Problems:   CAD (coronary artery disease)   HTN (hypertension)   UTI (urinary tract infection)   PAF (paroxysmal atrial fibrillation) (HCC)   Pulmonary fibrosis (HCC)   Chronic respiratory failure with hypoxia (HCC)   COPD, severe (HCC)   Abnormal computed tomography angiography (CTA) of abdomen and pelvis     Acute metabolic encephalopathy.  Likely due to UTI  (h/o frequent UTIs) On admission meets SIRS criteria, sepsis - was ruled out -Sepsis ruled out -SIRS etiology resolved -Hemodynamically stable  -Mental status questionably back to baseline, remained confused  -minimal meaningful conversation   CT head with no acute intracranial findings, and patient has no focal deficits -Patient did not meet sepsis criteria at this time. No fever or tachycardia though with elevated WBC and lactic acid of 2, but patient did complete a course of Cipro a couple weeks prior -Lactic acid 2.0 >> 1.6 -Continue IV Rocephin, IV fluids -Follow cultures >> urine culture greater than 100 K colonies of E. coli pansensitive with exception of clinical -Neurologic checks, fall and aspiration precautions     Pulmonary fibrosis (HCC)/ Chronic respiratory failure with hypoxia (HCC)/ COPD, severe (HCC) -Satting 95% 3 L of oxygen by nasal cannula -No signs of exacerbation-at baseline -Continue home inhalers. DuoNeb as needed -Remained stable     CAD (coronary artery disease) -Stable no complaint of chest pain -No EKG changes -Continue metoprolol, nitroglycerin, not currently on Zetia    HTN (hypertension) -Diltiazem -BP remained stable, as needed hydralazine    PAF (paroxysmal atrial fibrillation) (HCC) -Continue apixaban, metoprolol -No changes remain well controlled    Abnormal computed tomography angiography (CTA) of abdomen and pelvis -Abnormality rectosigmoid area -Consider GI consult ????    DVT prophylaxis: Apixaban Code Status: full code  Family Communication:  none ---  called patient's wife Abe People at home number and mobile phone [phone] no answer message was left Disposition Plan: Back to previous home environment Consults called: none  Status:At the time of admission, it appears that the appropriate admission status for this patient is INPATIENT     Nutritional status:         Cultures; Blood Cultures x 2 >> NGT Sputum Culture >> > 100 K colonies of E. coli, pansensitive with exception of quinolones  Antimicrobials: 06/22/2020 IV Rocephin >>      Admission status:    Status is: Inpatient  Remains inpatient appropriate  because:Inpatient level of care appropriate due to severity of illness   Dispo: The patient is from: ALF              Anticipated d/c is to: ALF              Anticipated d/c date is: 2 days              Patient currently is not medically stable to d/c.        Procedures:   No admission procedures for hospital encounter.     Antimicrobials:  Anti-infectives (From admission, onward)   Start     Dose/Rate Route Frequency Ordered Stop   06/22/20 2200  cefTRIAXone (ROCEPHIN) 1 g in sodium chloride 0.9 % 100 mL IVPB        1 g 200 mL/hr over 30 Minutes Intravenous Every 24 hours 06/03/2020 2248     06/05/2020 2145  cefTRIAXone (ROCEPHIN) 2 g in sodium chloride 0.9 % 100 mL IVPB        2 g 200 mL/hr over 30 Minutes Intravenous  Once 06/04/2020 2142 06/10/2020 2249       Medication:  . acidophilus  1 capsule Oral Daily  . apixaban  2.5 mg Oral BID  . diltiazem  360 mg Oral Daily  . escitalopram  10 mg Oral Daily  . ezetimibe  10 mg Oral Q M,W,F  . fluticasone  2 spray Each Nare Daily  . furosemide  10 mg Oral QODAY  . metoprolol succinate  50 mg Oral Daily  . mometasone-formoterol  2 puff Inhalation BID  . multivitamin with minerals  1 tablet Oral Daily  . pantoprazole  40 mg Oral Daily  . predniSONE  10 mg Oral Q breakfast    acetaminophen **OR** acetaminophen, albuterol, morphine injection, nitroGLYCERIN, ondansetron **OR** ondansetron (ZOFRAN) IV, psyllium, traZODone   Objective:   Vitals:   06/22/20 2130 06/22/20 2228 06/23/20 0322 06/23/20 1045  BP: (!) 154/90 (!) 158/97 104/74 (!) 124/98  Pulse: 83 83 98 (!) 129  Resp:  17 20   Temp:  97.9 F (36.6 C) 98.5 F (36.9 C) 98 F (36.7 C)  TempSrc:  Oral Oral   SpO2: 91% 91% 91% 94%  Weight:      Height:        Intake/Output Summary (Last 24 hours) at 06/23/2020 1048 Last data filed at 06/23/2020 0300 Gross per 24 hour  Intake 2210.68 ml  Output --  Net 2210.68 ml   Filed Weights   06/20/2020 1237  Weight:  53.5 kg     Examination:       Physical Exam:   General:   Was sleeping comfortably, arousable, confused  HEENT:  Normocephalic, PERRL, otherwise with in Normal limits   Neuro:  CNII-XII intact. , normal motor and sensation, reflexes intact   Lungs:   Clear to auscultation BL, Respirations  unlabored, no wheezes / crackles  Cardio:    S1/S2, RRR, No murmure, No Rubs or Gallops   Abdomen:   Soft, non-tender, bowel sounds active all four quadrants,  no guarding or peritoneal signs.  Muscular skeletal:   Generalized weaknesses, Limited exam - in bed, able to move all 4 extremities, Normal strength,  2+ pulses,  symmetric, No pitting edema  Skin:  Dry, warm to touch, negative for any Rashes, No open wounds  Wounds: Please see nursing documentation         ------------------------------------------------------------------------------------------------------------------------------------------    LABs:  CBC Latest Ref Rng & Units 06/22/2020 05/27/2020 05/26/2020  WBC 4.0 - 10.5 K/uL 14.1(H) 11.7(H) 19.5(H)  Hemoglobin 13.0 - 17.0 g/dL 14.1 14.3 14.7  Hematocrit 39 - 52 % 43.7 42.0 45.0  Platelets 150 - 400 K/uL 216 201 189   CMP Latest Ref Rng & Units 06/22/2020 06/01/2020 05/26/2020  Glucose 70 - 99 mg/dL 89 91 141(H)  BUN 8 - 23 mg/dL 11 10 17   Creatinine 0.61 - 1.24 mg/dL 0.65 0.68 0.75  Sodium 135 - 145 mmol/L 140 137 136  Potassium 3.5 - 5.1 mmol/L 3.6 3.6 3.9  Chloride 98 - 111 mmol/L 107 103 104  CO2 22 - 32 mmol/L 20(L) 25 21(L)  Calcium 8.9 - 10.3 mg/dL 8.3(L) 8.8(L) 8.7(L)  Total Protein 6.5 - 8.1 g/dL - 6.3(L) 6.4(L)  Total Bilirubin 0.3 - 1.2 mg/dL - 1.1 1.2  Alkaline Phos 38 - 126 U/L - 107 103  AST 15 - 41 U/L - 25 65(H)  ALT 0 - 44 U/L - 26 73(H)       Micro Results Recent Results (from the past 240 hour(s))  Culture, blood (Routine x 2)     Status: None (Preliminary result)   Collection Time: 06/02/2020  1:07 PM   Specimen: BLOOD LEFT ARM  Result Value Ref  Range Status   Specimen Description BLOOD LEFT ARM  Final   Special Requests   Final    BOTTLES DRAWN AEROBIC AND ANAEROBIC Blood Culture results may not be optimal due to an excessive volume of blood received in culture bottles   Culture   Final    NO GROWTH 2 DAYS Performed at [facility], [address], [place], [place] [address]    Report Status PENDING  Incomplete  Culture, blood (Routine x 2)     Status: None (Preliminary result)   Collection Time: 06/05/2020  8:37 PM   Specimen: BLOOD  Result Value Ref Range Status   Specimen Description BLOOD RIGHT ANTECUBITAL  Final   Special Requests   Final    BOTTLES DRAWN AEROBIC AND ANAEROBIC Blood Culture adequate volume   Culture   Final    NO GROWTH 2 DAYS Performed at [facility], [address] [place], [place] [address]    Report Status PENDING  Incomplete  Urine Culture     Status: Abnormal   Collection Time: 06/06/2020  8:37 PM   Specimen: Urine, Random  Result Value Ref Range Status   Specimen Description   Final    URINE, RANDOM Performed at [facility], [address], [place], [place] [address]    Special Requests   Final    NONE Performed at [facility], [address] [place], [place] [address]    Culture >=100,000 COLONIES/mL ESCHERICHIA COLI (A)  Final   Report Status 06/23/2020 FINAL  Final   Organism ID, Bacteria ESCHERICHIA COLI (A)  Final  Susceptibility   Escherichia coli - MIC*    AMPICILLIN <=2 SENSITIVE Sensitive     CEFAZOLIN <=4 SENSITIVE Sensitive     CEFTRIAXONE <=0.25 SENSITIVE Sensitive     CIPROFLOXACIN >=4 RESISTANT Resistant     GENTAMICIN <=1 SENSITIVE Sensitive     IMIPENEM <=0.25 SENSITIVE Sensitive     NITROFURANTOIN <=16 SENSITIVE Sensitive     TRIMETH/SULFA <=20 SENSITIVE Sensitive     AMPICILLIN/SULBACTAM <=2 SENSITIVE Sensitive     PIP/TAZO <=4 SENSITIVE Sensitive     * >=100,000 COLONIES/mL ESCHERICHIA COLI  Respiratory Panel by RT  PCR (Flu A&B, Covid) - Nasopharyngeal Swab     Status: None   Collection Time: 06/18/2020  8:46 PM   Specimen: Nasopharyngeal Swab  Result Value Ref Range Status   SARS Coronavirus 2 by RT PCR NEGATIVE NEGATIVE Final    Comment: (NOTE) SARS-CoV-2 target nucleic acids are NOT DETECTED.  The SARS-CoV-2 RNA is generally detectable in upper respiratoy specimens during the acute phase of infection. The lowest concentration of SARS-CoV-2 viral copies this assay can detect is 131 copies/mL. A negative result does not preclude SARS-Cov-2 infection and should not be used as the sole basis for treatment or other patient management decisions. A negative result may occur with  improper specimen collection/handling, submission of specimen other than nasopharyngeal swab, presence of viral mutation(s) within the areas targeted by this assay, and inadequate number of viral copies (<131 copies/mL). A negative result must be combined with clinical observations, patient history, and epidemiological information. The expected result is Negative.  Fact Sheet for Patients:  PinkCheek.be  Fact Sheet for Healthcare Providers:  GravelBags.it  This test is no t yet approved or cleared by the [place] FDA and  has been authorized for detection and/or diagnosis of SARS-CoV-2 by FDA under an Emergency Use Authorization (EUA). This EUA will remain  in effect (meaning this test can be used) for the duration of the COVID-19 declaration under Section 564(b)(1) of the Act, 21 U.S.C. section 360bbb-3(b)(1), unless the authorization is terminated or revoked sooner.     Influenza A by PCR NEGATIVE NEGATIVE Final   Influenza B by PCR NEGATIVE NEGATIVE Final    Comment: (NOTE) The Xpert Xpress SARS-CoV-2/FLU/RSV assay is intended as an aid in  the diagnosis of influenza from Nasopharyngeal swab specimens and  should not be used as a sole basis for  treatment. Nasal washings and  aspirates are unacceptable for Xpert Xpress SARS-CoV-2/FLU/RSV  testing.  Fact Sheet for Patients: PinkCheek.be  Fact Sheet for Healthcare Providers: GravelBags.it  This test is not yet approved or cleared by the [place] FDA and  has been authorized for detection and/or diagnosis of SARS-CoV-2 by  FDA under an Emergency Use Authorization (EUA). This EUA will remain  in effect (meaning this test can be used) for the duration of the  Covid-19 declaration under Section 564(b)(1) of the Act, 21  U.S.C. section 360bbb-3(b)(1), unless the authorization is  terminated or revoked. Performed at [facility], [address] [place], [place] [address]     Radiology Reports DG Chest 2 View  Result Date: 06/18/2020 CLINICAL DATA:  Suspect sepsis. EXAM: CHEST - 2 VIEW COMPARISON:  03/25/2019 FINDINGS: Heart size and vascularity normal. Mild bibasilar atelectasis. Small right effusion. Elevated left hemidiaphragm with bowel gas below the left diaphragm. Mild hyperinflation lungs. No mass lesion. No acute skeletal abnormality. IMPRESSION: Mild bibasilar atelectasis and small right effusion.   COPD. Electronically Signed   By: Juanda Crumble  Carlis Abbott M.D.   On: 06/10/2020 13:35   CT Head Wo Contrast  Result Date: 06/12/2020 CLINICAL DATA:  Altered mental status. EXAM: CT HEAD WITHOUT CONTRAST TECHNIQUE: Contiguous axial images were obtained from the base of the skull through the vertex without intravenous contrast. COMPARISON:  March 16, 2018 FINDINGS: Brain: There is mild cerebral atrophy with widening of the extra-axial spaces and ventricular dilatation. There are areas of decreased attenuation within the white matter tracts of the supratentorial brain, consistent with microvascular disease changes. Vascular: No hyperdense vessel or unexpected calcification. Skull: Normal. Negative for fracture or focal  lesion. Sinuses/Orbits: Very mild, bilateral ethmoid sinus mucosal thickening is seen. Other: None. IMPRESSION: 1. Generalized cerebral atrophy. 2. Very mild, bilateral ethmoid sinus disease. 3. No acute intracranial abnormality. Electronically Signed   By: Virgina Norfolk M.D.   On: 05/31/2020 21:11   CT ABDOMEN PELVIS W CONTRAST  Result Date: 06/18/2020 CLINICAL DATA:  Abdominal pain. EXAM: CT ABDOMEN AND PELVIS WITH CONTRAST TECHNIQUE: Multidetector CT imaging of the abdomen and pelvis was performed using the standard protocol following bolus administration of intravenous contrast. CONTRAST:  140mL OMNIPAQUE IOHEXOL 300 MG/ML  SOLN COMPARISON:  None. FINDINGS: Lower chest: A trace amount of atelectasis is seen within the right lung base. A very small right pleural effusion is noted. Hepatobiliary: No focal liver abnormality is seen. Status post cholecystectomy. No biliary dilatation. Pancreas: Unremarkable. No pancreatic ductal dilatation or surrounding inflammatory changes. Spleen: Normal in size without focal abnormality. Adrenals/Urinary Tract: Adrenal glands are unremarkable. Kidneys are normal, without renal calculi, focal lesion, or hydronephrosis. Bladder is unremarkable. Stomach/Bowel: Stomach is within normal limits. Appendix appears normal. No evidence of bowel dilatation. There is marked severity thickening of the rectum and adjacent portion of the distal sigmoid colon (axial CT images 71 through 75, CT series number 2). This is slightly asymmetric in appearance, right greater than left. No associated inflammatory fat stranding is seen. Vascular/Lymphatic: There is marked severity calcification and atherosclerosis of the abdominal aorta and bilateral common iliac arteries, without evidence of aneurysmal dilatation. No enlarged abdominal or pelvic lymph nodes. Reproductive: The prostate gland is mildly enlarged. Other: No abdominal wall hernia or abnormality. No abdominopelvic ascites.  Musculoskeletal: Moderate to marked severity multilevel degenerative changes seen throughout the lumbar spine. IMPRESSION: 1. Asymmetric of the rectum and adjacent portion of the sigmoid colon, which may be secondary to an underlying mass. Correlation with physical examination and MRI is recommended. 2. Evidence of prior cholecystectomy. 3. Very small right pleural effusion. 4. Mildly enlarged prostate gland. 5. Moderate to marked severity multilevel degenerative changes throughout the lumbar spine. 6. Aortic atherosclerosis. Aortic Atherosclerosis (ICD10-I70.0). Electronically Signed   By: Virgina Norfolk M.D.   On: 06/06/2020 21:22    SIGNED: Deatra James, MD, FACP, FHM. [facility],  Pager (please use [email or web address] to page/text)  If 7PM-7AM, please contact night-coverage [email or web address] Ota [facility] 06/23/2020, 10:48 AM

## 2020-06-24 ENCOUNTER — Inpatient Hospital Stay: Payer: PPO

## 2020-06-24 DIAGNOSIS — J9611 Chronic respiratory failure with hypoxia: Secondary | ICD-10-CM | POA: Diagnosis not present

## 2020-06-24 LAB — BASIC METABOLIC PANEL
Anion gap: 10 (ref 5–15)
BUN: 19 mg/dL (ref 8–23)
CO2: 22 mmol/L (ref 22–32)
Calcium: 8.1 mg/dL — ABNORMAL LOW (ref 8.9–10.3)
Chloride: 106 mmol/L (ref 98–111)
Creatinine, Ser: 0.73 mg/dL (ref 0.61–1.24)
GFR calc Af Amer: >60 mL/min (ref >60)
GFR calc non Af Amer: >60 mL/min (ref >60)
Glucose, Bld: 146 mg/dL — ABNORMAL HIGH (ref 70–99)
Potassium: 2.9 mmol/L — ABNORMAL LOW (ref 3.5–5.1)
Sodium: 138 mmol/L (ref 135–145)

## 2020-06-24 LAB — POTASSIUM: Potassium: 3.1 mmol/L — ABNORMAL LOW (ref 3.5–5.1)

## 2020-06-24 LAB — BLOOD GAS, ARTERIAL
Acid-Base Excess: 1.9 mmol/L (ref 0.0–2.0)
Allens test (pass/fail): POSITIVE — AB
Bicarbonate: 24.4 mmol/L (ref 20.0–28.0)
FIO2: 100
O2 Saturation: 99 %
Patient temperature: 37
pCO2 arterial: 32 mmHg (ref 32.0–48.0)
pH, Arterial: 7.49 — ABNORMAL HIGH (ref 7.350–7.450)
pO2, Arterial: 124 mmHg — ABNORMAL HIGH (ref 83.0–108.0)

## 2020-06-24 IMAGING — MR MR ABDOMEN WO/W CM
19 series · 48 of 48 positions shown · IV contrast (gadavist)
Comparison: [DATE] CT abdomen/pelvis.

CLINICAL DATA: Inpatient. Frequent UTI. COPD and pulmonary fibrosis
with chronic respiratory failure. Confusion. Concern for rectal mass
on recent CT.

EXAM:
MRI ABDOMEN WITHOUT AND WITH CONTRAST
TECHNIQUE: Multiplanar multisequence MR imaging of the abdomen was performed
both before and after the administration of intravenous contrast.
CONTRAST:  7.5mL GADAVIST GADOBUTROL 1 MMOL/ML IV SOLN

[Series 2: T2 · coronal · 6.0mm · 1.25mm/px · 2 of 30 slices shown (1 of 2)]
[im 1/30]
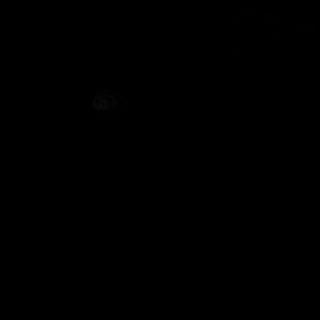
[im 30/30]
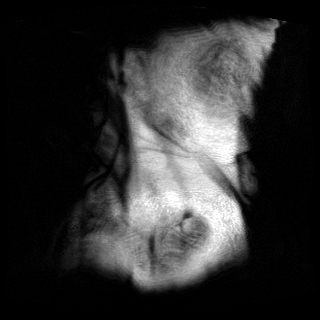

[Series 4: T2 fat-sat · axial · 6.0mm · 1.19mm/px · z∈[-38,+169]mm · 2 of 30 slices shown]
[im 1/30]
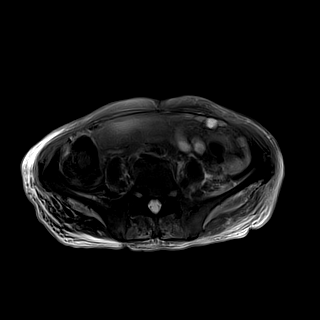
[im 30/30]
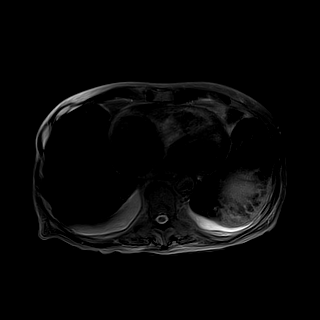

[Series 5: ax dwi_tracew · axial · 6.0mm · 1.42mm/px · z∈[-38,+170]mm · 5 of 90 slices shown]
[im 1/90]
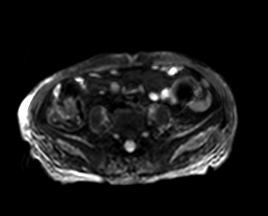
[im 23/90]
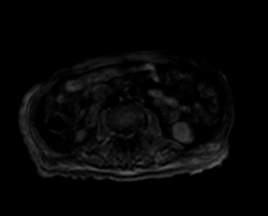
[im 45/90]
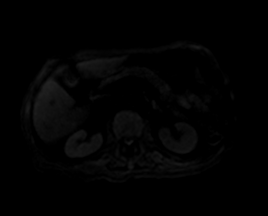
[im 67/90]
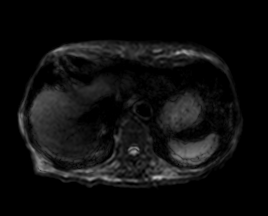
[im 90/90]
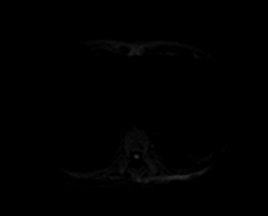

[Series 6: ax dwi_adc · axial · 6.0mm · 1.42mm/px · z∈[-38,+170]mm · 2 of 30 slices shown]
[im 1/30]
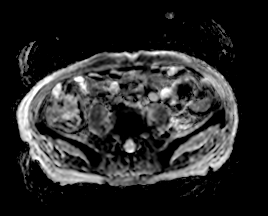
[im 30/30]
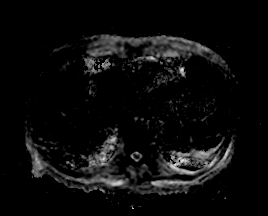

[Series 7: T2 · axial · 6.0mm · 1.19mm/px · 1 of 30 slices shown (2 of 2)]
[im 1/30]
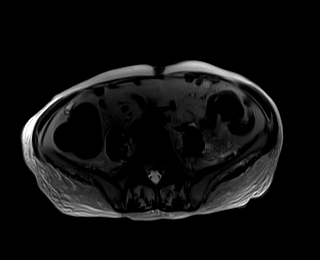

[Series 8: T1 · axial · 6.0mm · 0.74mm/px · 1 of 30 slices shown (1 of 2)]
[im 1/30]
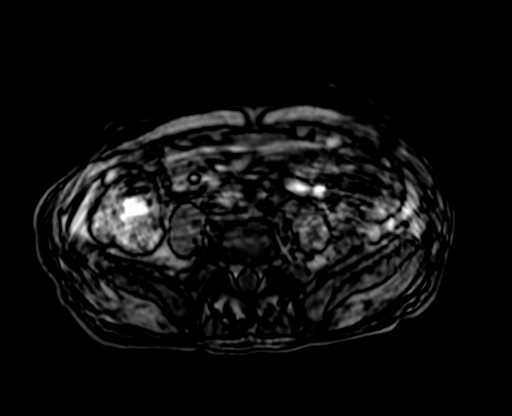

[Series 8: T1 · axial · 6.0mm · 0.74mm/px · 1 of 30 slices shown (2 of 2)]
[im 1/30]
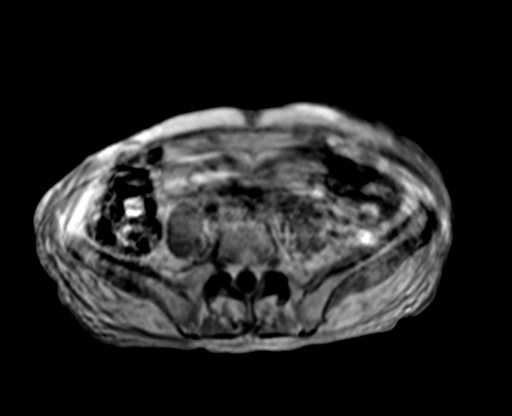

[Series 9: bSSFP · axial · 6.0mm · 0.74mm/px · 1 of 30 slices shown]
[im 1/30]
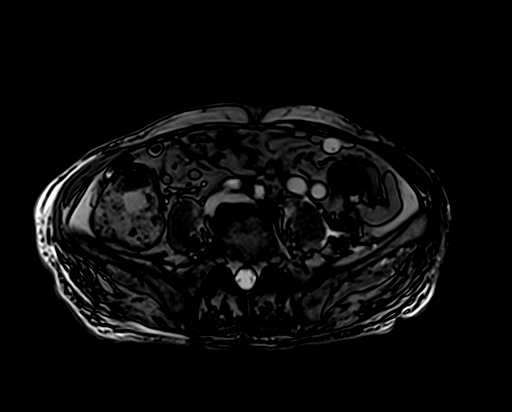

[Series 10: T1 fat-sat · axial · 3.0mm · 1.56mm/px · z∈[-39,+173]mm · 3 of 72 slices shown]
[im 1/72]
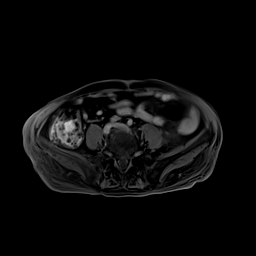
[im 36/72]
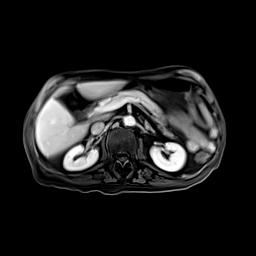
[im 72/72]
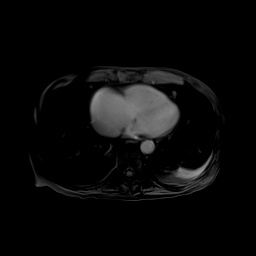

[Series 11: T1 dynamic fat-sat · axial · 3.0mm · 1.56mm/px · z∈[-39,+173]mm · 3 of 72 slices shown (1 of 9)]
[im 1/72]
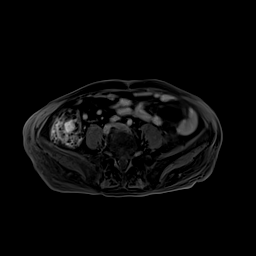
[im 36/72]
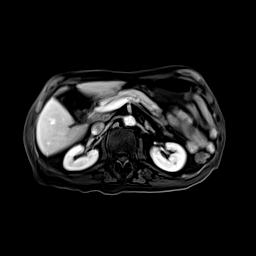
[im 72/72]
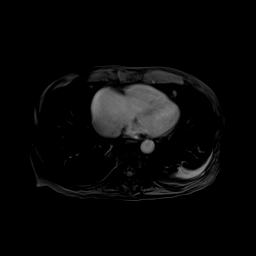

[Series 11: T1 dynamic fat-sat · axial · 3.0mm · 1.56mm/px · z∈[-39,+173]mm · 3 of 72 slices shown (2 of 9)]
[im 1/72]
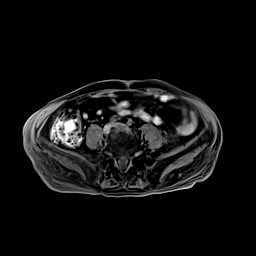
[im 36/72]
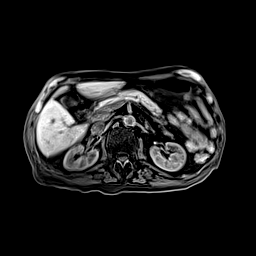
[im 72/72]
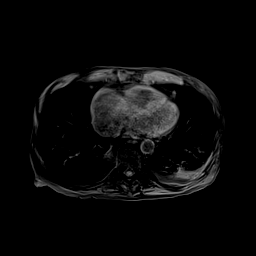

[Series 11: T1 dynamic fat-sat · axial · 3.0mm · 1.56mm/px · z∈[-39,+173]mm · 3 of 72 slices shown (3 of 9)]
[im 1/72]
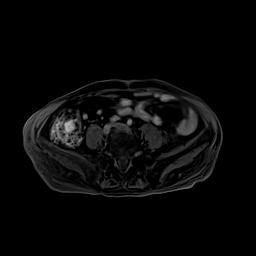
[im 36/72]
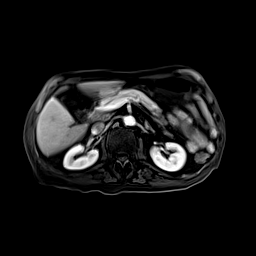
[im 72/72]
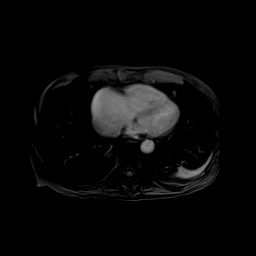

[Series 11: T1 dynamic fat-sat · axial · 3.0mm · 1.56mm/px · z∈[-39,+173]mm · 3 of 72 slices shown (4 of 9)]
[im 1/72]
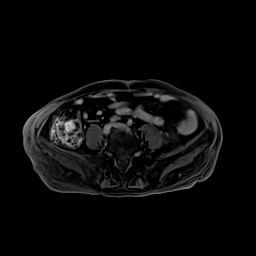
[im 36/72]
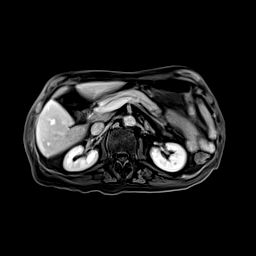
[im 72/72]
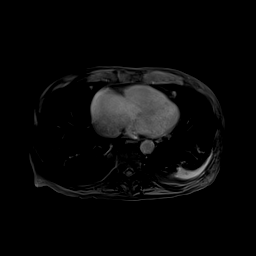

[Series 11: T1 dynamic fat-sat · axial · 3.0mm · 1.56mm/px · z∈[-39,+173]mm · 3 of 72 slices shown (5 of 9)]
[im 1/72]
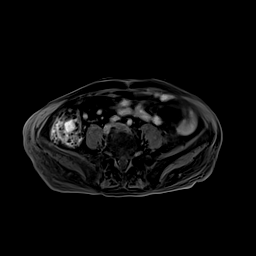
[im 36/72]
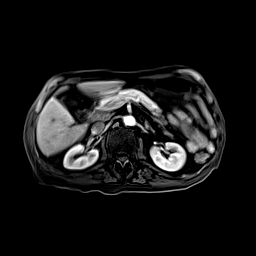
[im 72/72]
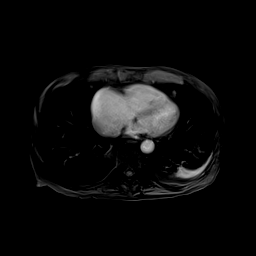

[Series 12: T1 dynamic post-contrast · coronal · 3.0mm · 1.25mm/px · 3 of 72 slices shown]
[im 1/72]
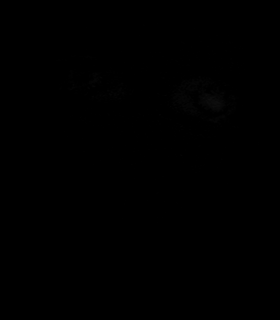
[im 36/72]
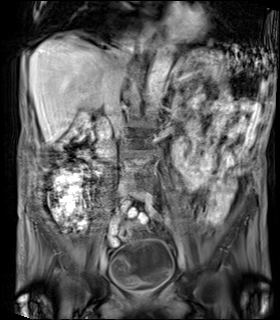
[im 72/72]
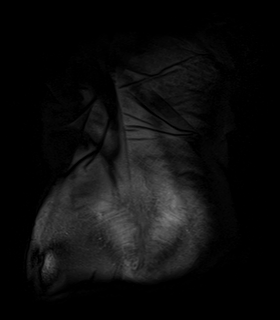

[Series 1025: T1 dynamic fat-sat · axial · 3.0mm · 1.56mm/px · z∈[-39,+173]mm · 3 of 72 slices shown (6 of 9)]
[im 1/72]
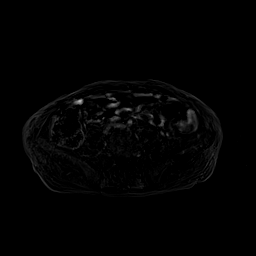
[im 36/72]
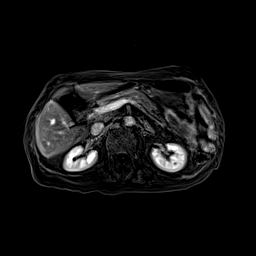
[im 72/72]
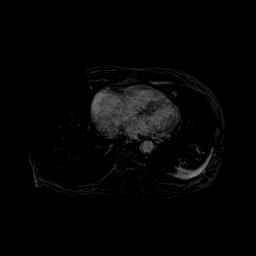

[Series 1025: T1 dynamic fat-sat · axial · 3.0mm · 1.56mm/px · z∈[-39,+173]mm · 3 of 72 slices shown (7 of 9)]
[im 1/72]
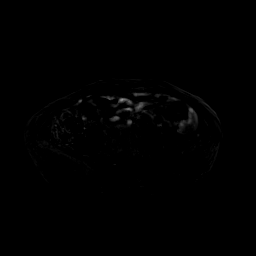
[im 36/72]
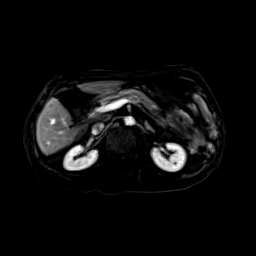
[im 72/72]
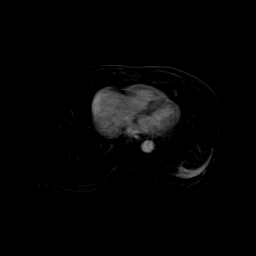

[Series 1025: T1 dynamic fat-sat · axial · 3.0mm · 1.56mm/px · z∈[-39,+173]mm · 3 of 72 slices shown (8 of 9)]
[im 1/72]
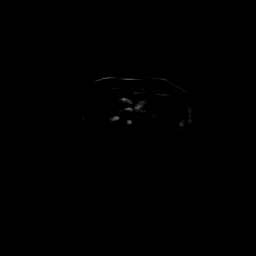
[im 36/72]
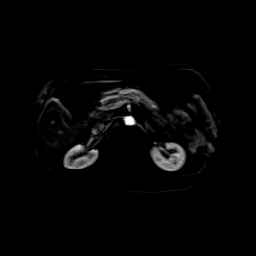
[im 72/72]
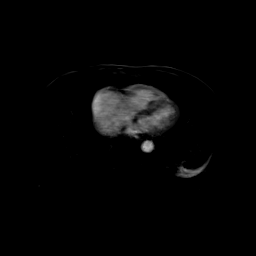

[Series 1025: T1 dynamic fat-sat · axial · 3.0mm · 1.56mm/px · z∈[-39,+173]mm · 3 of 72 slices shown (9 of 9)]
[im 1/72]
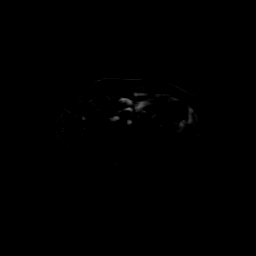
[im 36/72]
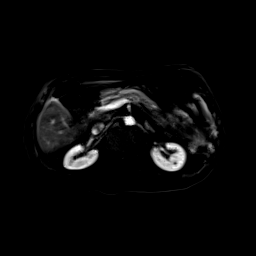
[im 72/72]
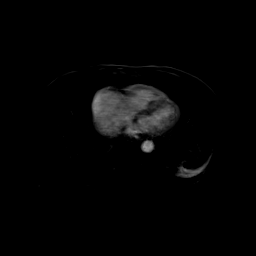

[48 of 48 positions shown; findings below may reference images not displayed]

FINDINGS: Lower chest: Small dependent bilateral pleural effusions, right
greater than left.

Hepatobiliary: Normal liver size and configuration. No hepatic
steatosis. No liver mass. Cholecystectomy. Bile ducts are within
normal post cholecystectomy limits. Common bile duct diameter 5 mm.
No choledocholithiasis. No biliary masses, strictures or beading.
Probable 12 mm diameter cystic duct remnant in the porta hepatis
(series 4/image 14).

Pancreas: No pancreatic mass or duct dilation.  No pancreas divisum.

Spleen: Normal size. No mass.

Adrenals/Urinary Tract: Normal adrenals. No hydronephrosis.
Scattered simple subcentimeter renal cortical cysts in both kidneys.
No suspicious renal masses.

Stomach/Bowel: Normal non-distended stomach. Visualized small and
large bowel is normal caliber, with no bowel wall thickening.

Vascular/Lymphatic: Atherosclerotic nonaneurysmal abdominal aorta.
Patent portal, splenic, hepatic and renal veins. Retroaortic left
renal vein. No pathologically enlarged lymph nodes in the abdomen.

Other: No abdominal ascites or focal fluid collection.

Musculoskeletal: No aggressive appearing focal osseous lesions. Mild
L4 vertebral compression fracture, chronic appearing.
IMPRESSION: 1. No lymphadenopathy or other findings to suggest metastatic
disease in the abdomen. MRI pelvis to be reported separately.
2. Bile ducts are within normal post cholecystectomy limits (CBD
diameter 5 mm). No choledocholithiasis. Probable small cystic duct
remnant in the porta hepatis.
3. Small dependent bilateral pleural effusions, right greater than
left.
4. Mild L4 vertebral compression fracture, chronic appearing.

## 2020-06-24 IMAGING — MR MR PELVIS W/O CM
8 series · 48 of 48 positions shown · non-contrast
Comparison: CT abdomen/pelvis dated [DATE]

CLINICAL DATA: Possible rectal mass on CT

EXAM:
MRI PELVIS WITHOUT CONTRAST
TECHNIQUE: Multiplanar multisequence MR imaging of the pelvis was performed. No
intravenous contrast was administered. Small amount of US gel was
administered per rectum to optimize tumor evaluation.

[Series 3: T2 · sagittal · 3.0mm · 0.97mm/px · 6 of 40 slices shown (1 of 5)]
[im 1/40]
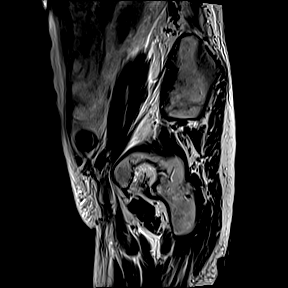
[im 8/40]
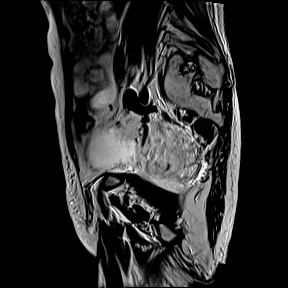
[im 16/40]
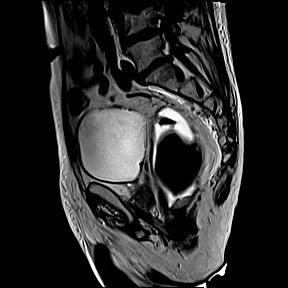
[im 24/40]
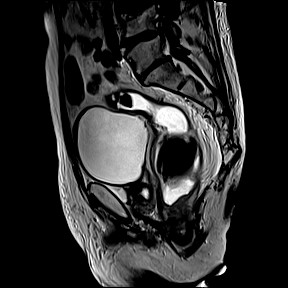
[im 32/40]
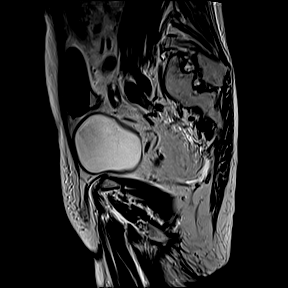
[im 40/40]
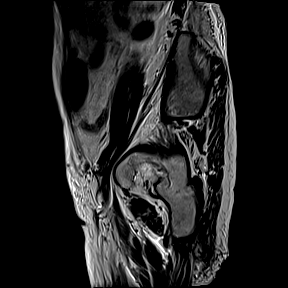

[Series 4: T2 · coronal · 3.0mm · 0.62mm/px · 6 of 48 slices shown (2 of 5)]
[im 1/48]
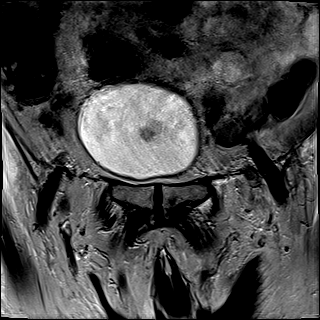
[im 10/48]
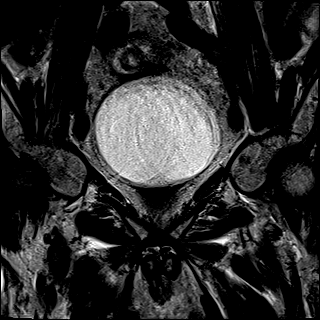
[im 19/48]
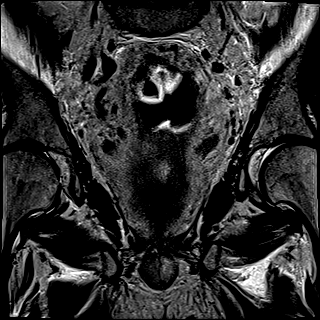
[im 29/48]
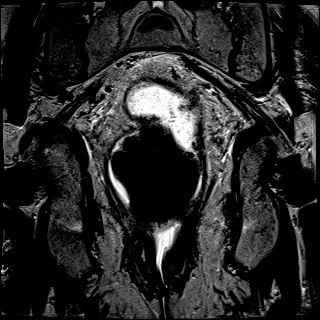
[im 38/48]
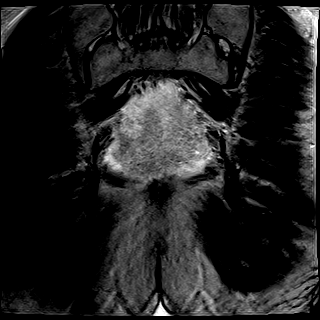
[im 48/48]
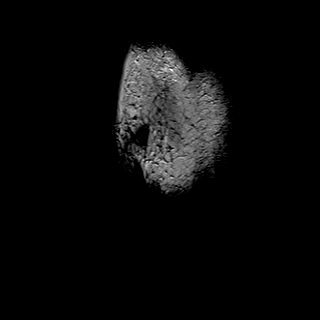

[Series 5: T2 · axial · 5.0mm · 1.06mm/px · z∈[-51,+195]mm · 5 of 42 slices shown (3 of 5)]
[im 1/42]
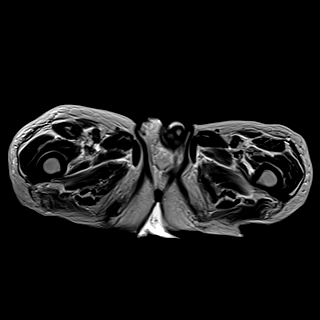
[im 11/42]
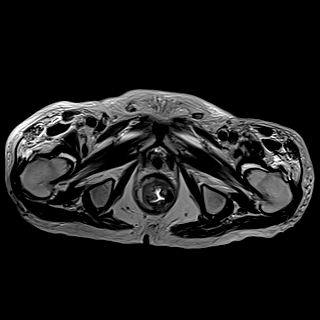
[im 21/42]
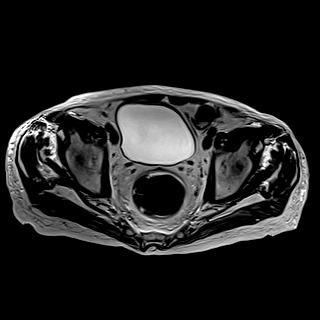
[im 31/42]
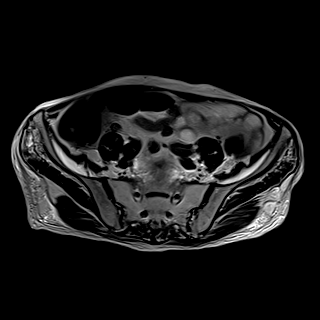
[im 42/42]
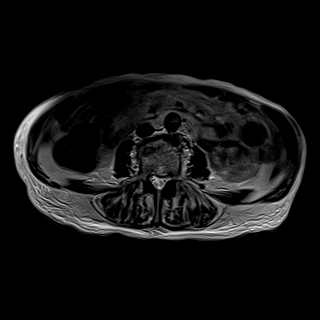

[Series 6: ax dwi_tracew · axial · 5.0mm · 1.48mm/px · z∈[-34,+141]mm · 13 of 108 slices shown]
[im 1/108]
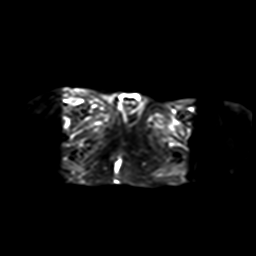
[im 9/108]
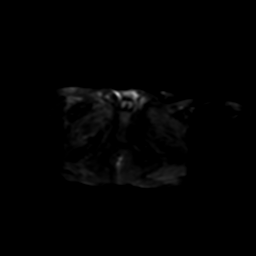
[im 18/108]
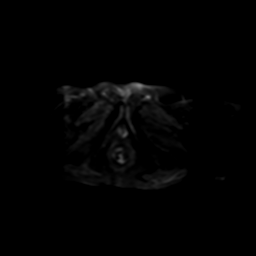
[im 27/108]
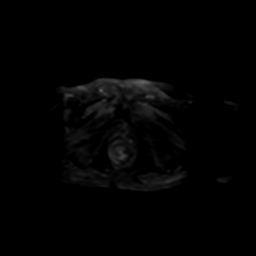
[im 36/108]
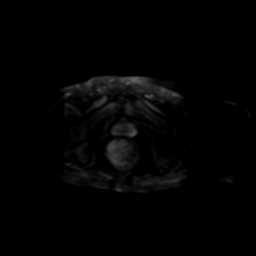
[im 45/108]
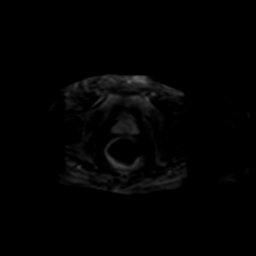
[im 54/108]
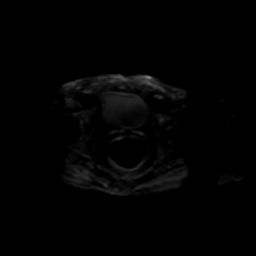
[im 63/108]
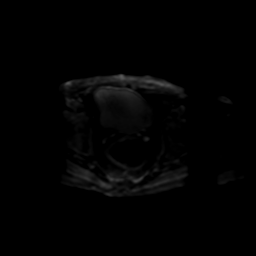
[im 72/108]
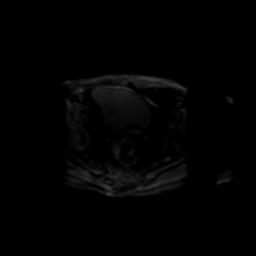
[im 81/108]
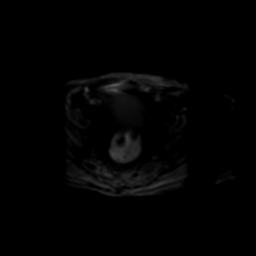
[im 90/108]
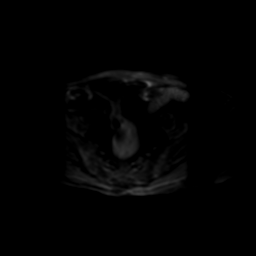
[im 99/108]
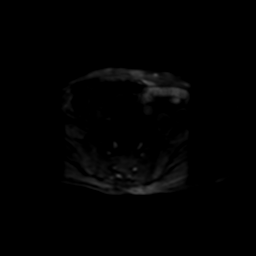
[im 108/108]
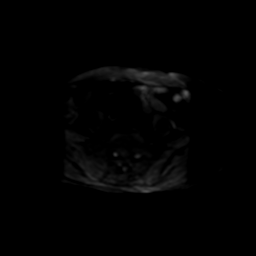

[Series 7: ax dwi_adc · axial · 5.0mm · 1.48mm/px · z∈[-34,+141]mm · 4 of 36 slices shown]
[im 1/36]
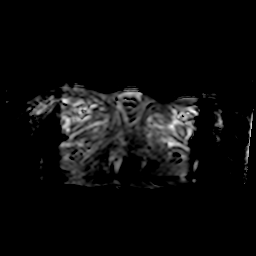
[im 12/36]
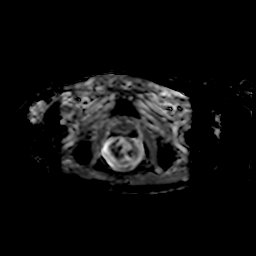
[im 24/36]
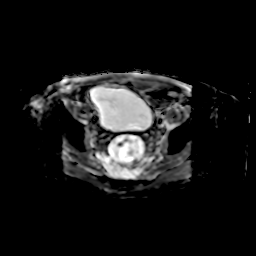
[im 36/36]
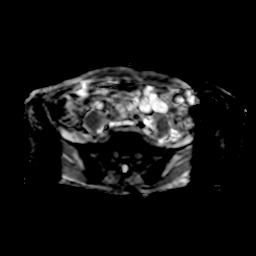

[Series 8: ax dwi_calc_bval · axial · 5.0mm · 1.48mm/px · z∈[-34,+141]mm · 4 of 36 slices shown]
[im 1/36]
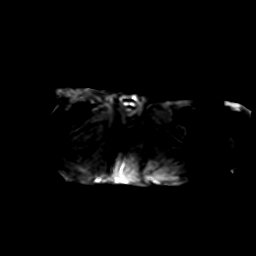
[im 12/36]
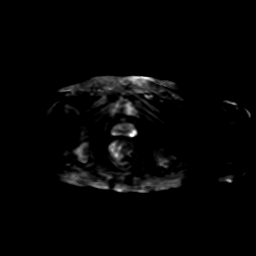
[im 24/36]
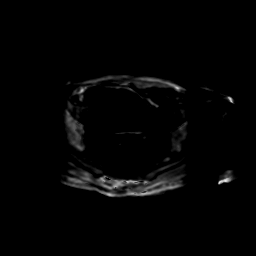
[im 36/36]
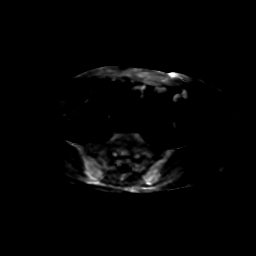

[Series 9: T2 · axial · 3.0mm · 0.56mm/px · z∈[-4,+126]mm · 5 of 45 slices shown (4 of 5)]
[im 1/45]
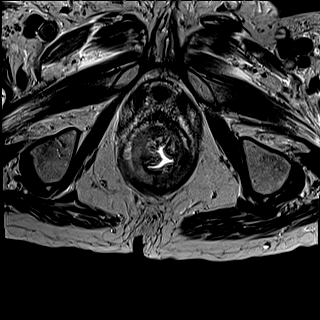
[im 12/45]
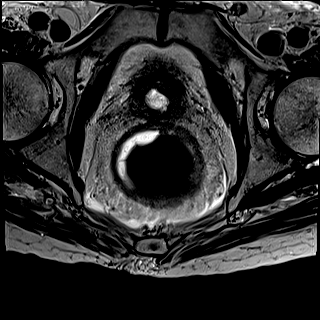
[im 23/45]
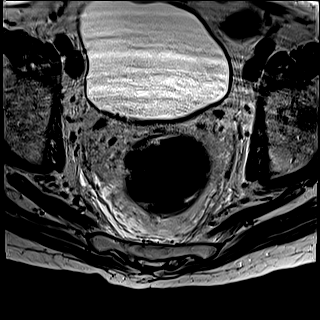
[im 34/45]
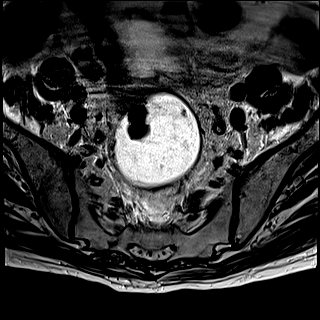
[im 45/45]
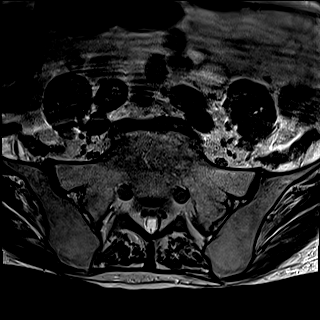

[Series 10: T2 · coronal · 3.0mm · 0.56mm/px · 5 of 45 slices shown (5 of 5)]
[im 1/45]
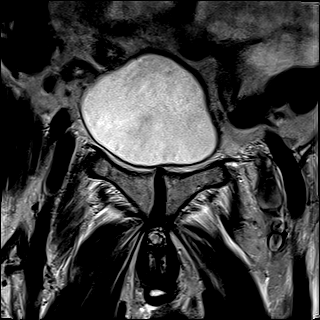
[im 12/45]
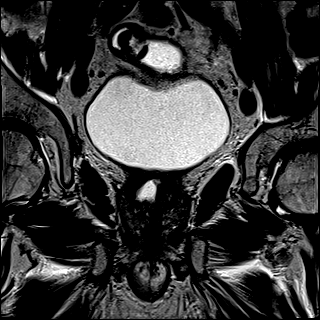
[im 23/45]
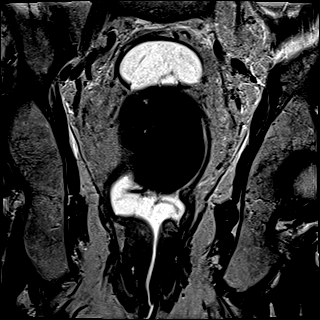
[im 34/45]
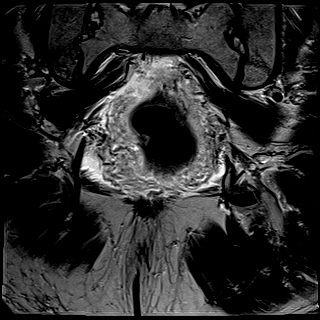
[im 45/45]
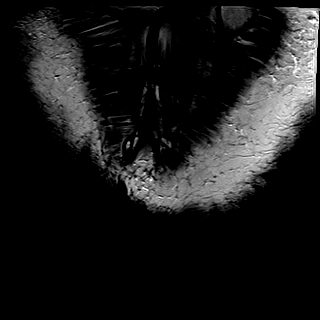

[48 of 48 positions shown; findings below may reference images not displayed]

FINDINGS: Urinary Tract:  Bladder is within normal limits.

Bowel: Distension of the rectum with ultrasound gel. No rectal mass
is seen.

Visualized bowel is otherwise unremarkable, including the sigmoid
colon.

Vascular/Lymphatic: No evidence of aneurysm.

No suspicious pelvic lymphadenopathy.

Reproductive:  Suspected postsurgical changes related to prior TURP.

Other:  No pelvic ascites.

Musculoskeletal: Mild degenerative changes of the lower lumbar
spine.
IMPRESSION: No rectosigmoid mass is evident on MR.

Negative pelvic MRI.

## 2020-06-24 IMAGING — DX DG CHEST 1V PORT
1 series · 1 of 1 positions shown · non-contrast
Comparison: [DATE]

CLINICAL DATA: Shortness of breath, pulmonary fibrosis, altered
mental status

EXAM:
PORTABLE CHEST 1 VIEW

[chest ap]
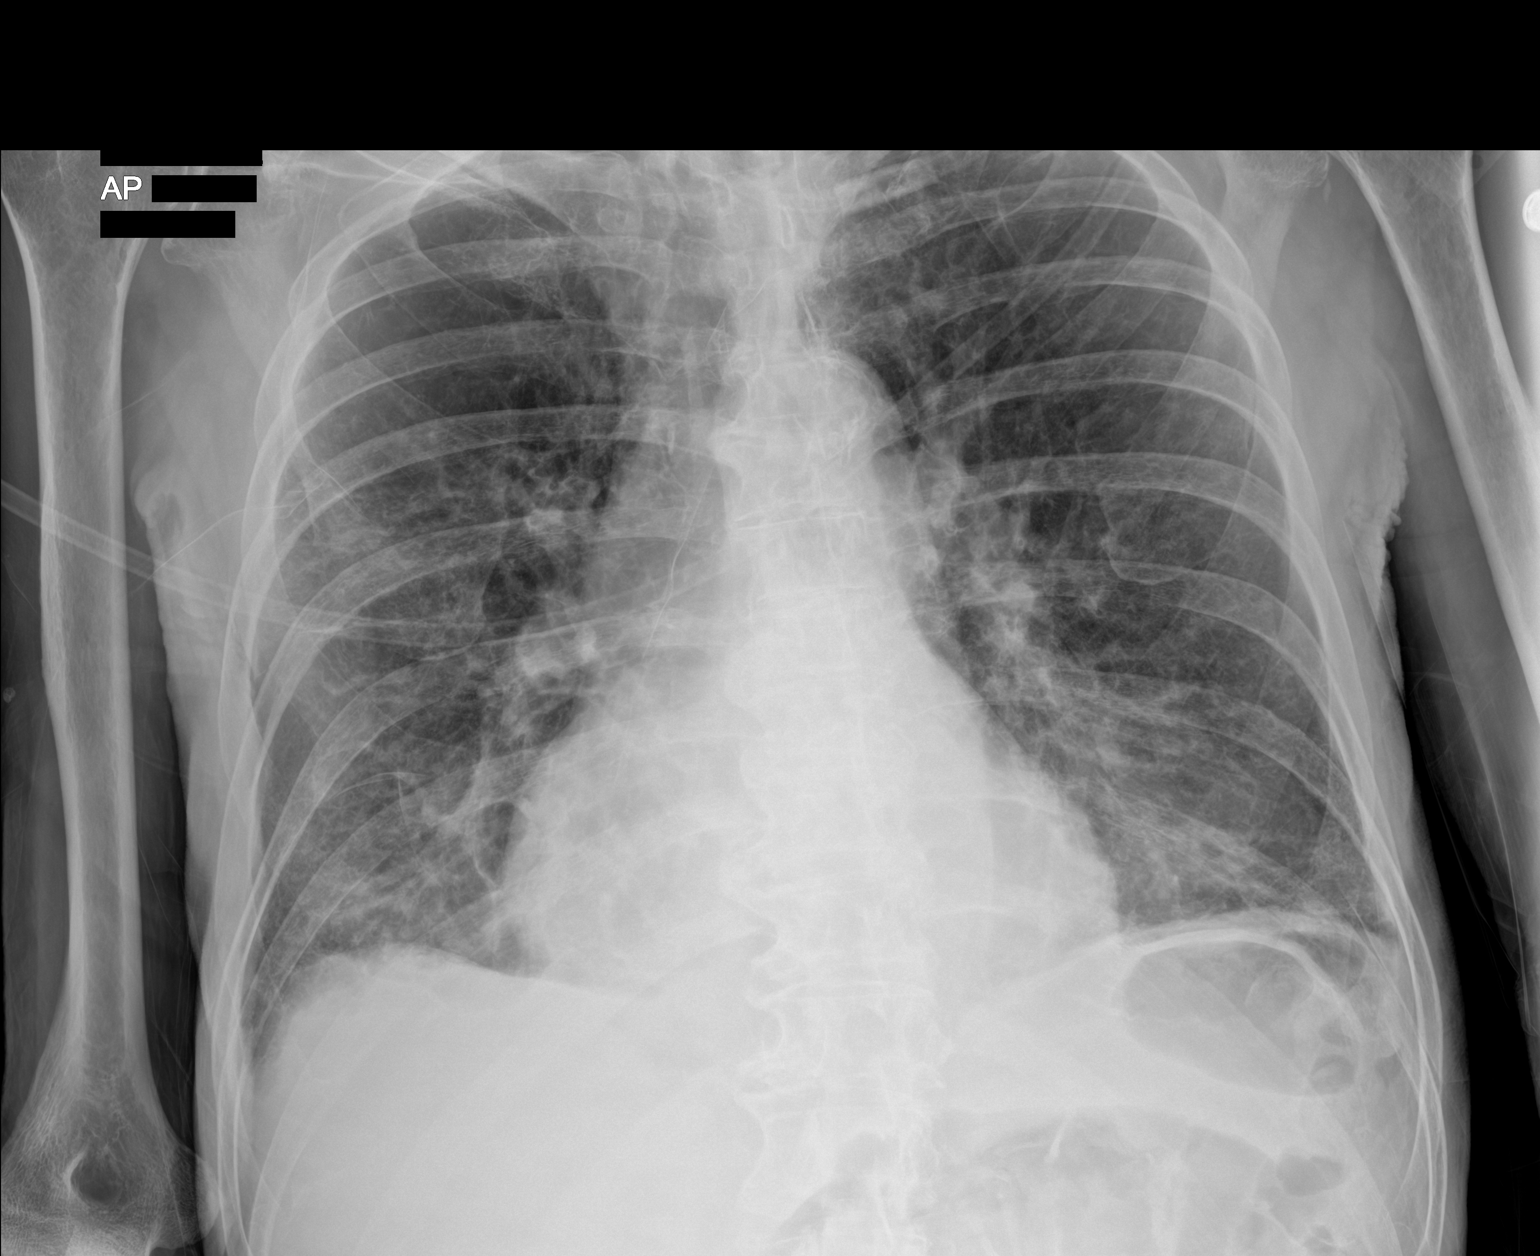

[1 of 1 positions shown; findings below may reference images not displayed]

FINDINGS: Mild cardiomegaly. Emphysema. Mild, diffuse interstitial opacity.
Bibasilar fibrotic scarring. The visualized skeletal structures are
unremarkable.
IMPRESSION: Emphysema and bibasilar fibrotic change with mild, diffuse
interstitial opacity, potentially edema. No focal airspace opacity.

## 2020-06-24 MED ORDER — PREDNISONE 20 MG PO TABS: 40.0000 mg | ORAL_TABLET | Freq: Every day | ORAL | Status: DC

## 2020-06-24 MED ORDER — METOPROLOL SUCCINATE ER 25 MG PO TB24
25.0000 mg | ORAL_TABLET | Freq: Every day | ORAL | Status: DC
  Administered 2020-06-25 – 2020-07-09 (×15): 25 mg via ORAL
  Filled 2020-06-24 (×15): qty 1

## 2020-06-24 MED ORDER — FUROSEMIDE 10 MG/ML IJ SOLN
40.0000 mg | INTRAMUSCULAR | Status: AC
  Administered 2020-06-24: 40 mg via INTRAVENOUS
  Filled 2020-06-24: qty 4

## 2020-06-24 MED ORDER — FUROSEMIDE 10 MG/ML IJ SOLN
20.0000 mg | Freq: Two times a day (BID) | INTRAMUSCULAR | Status: DC
  Administered 2020-06-24 – 2020-07-01 (×13): 20 mg via INTRAVENOUS
  Filled 2020-06-24 (×2): qty 2
  Filled 2020-06-24: qty 4
  Filled 2020-06-24 (×10): qty 2

## 2020-06-24 MED ORDER — POTASSIUM CHLORIDE 20 MEQ/15ML (10%) PO SOLN
20.0000 meq | Freq: Every day | ORAL | Status: DC
  Administered 2020-06-24 – 2020-07-01 (×8): 20 meq via ORAL
  Filled 2020-06-24 (×8): qty 15

## 2020-06-24 MED ORDER — POTASSIUM CHLORIDE CRYS ER 20 MEQ PO TBCR
40.0000 meq | EXTENDED_RELEASE_TABLET | Freq: Once | ORAL | Status: AC
  Administered 2020-06-24: 40 meq via ORAL
  Filled 2020-06-24: qty 2

## 2020-06-24 MED ORDER — POTASSIUM CHLORIDE 20 MEQ PO PACK
20.0000 meq | PACK | ORAL | Status: DC
  Administered 2020-06-24: 20 meq via ORAL
  Filled 2020-06-24 (×2): qty 1

## 2020-06-24 MED ORDER — GADOBUTROL 1 MMOL/ML IV SOLN
4.0000 mL | Freq: Once | INTRAVENOUS | Status: AC | PRN
  Administered 2020-06-24: 7.5 mL via INTRAVENOUS

## 2020-06-24 MED ORDER — METHYLPREDNISOLONE SODIUM SUCC 40 MG IJ SOLR
40.0000 mg | INTRAMUSCULAR | Status: DC
  Administered 2020-06-24 – 2020-06-25 (×2): 40 mg via INTRAVENOUS
  Filled 2020-06-24 (×2): qty 1

## 2020-06-24 MED ORDER — MAGNESIUM SULFATE 2 GM/50ML IV SOLN
2.0000 g | Freq: Once | INTRAVENOUS | Status: AC
  Administered 2020-06-24: 2 g via INTRAVENOUS
  Filled 2020-06-24: qty 50

## 2020-06-24 MED ORDER — TRAMADOL HCL 50 MG PO TABS
50.0000 mg | ORAL_TABLET | Freq: Four times a day (QID) | ORAL | Status: DC | PRN
  Administered 2020-06-24 – 2020-07-09 (×12): 50 mg via ORAL
  Filled 2020-06-24 (×12): qty 1

## 2020-06-24 MED ORDER — DICLOFENAC SODIUM 1 % EX GEL
2.0000 g | Freq: Two times a day (BID) | CUTANEOUS | Status: DC | PRN
  Filled 2020-06-24 (×2): qty 100

## 2020-06-24 NOTE — Progress Notes (Signed)
   06/24/20 0800 06/24/20 0830  Assess: MEWS Score  Temp 98.1 F (36.7 C) 98.3 F (36.8 C)  BP 128/80 (!) 141/81  Pulse Rate (!) 52 (!) 54  Resp (!) 24 (!) 24  Level of Consciousness Alert  --   SpO2 (!) 82 % 100 %  O2 Device HFNC Non-rebreather Mask  O2 Flow Rate (L/min) 15 L/min  --   Assess: MEWS Score  MEWS Temp 0 0  MEWS Systolic 0 0  MEWS Pulse 0 0  MEWS RR 1 1  MEWS LOC 0 0  MEWS Score 1 1  MEWS Score Color Ailene Ards  Notify: Provider  Provider Name/Title Shahmehdi  --   Date Provider Notified 06/24/20  --   Time Provider Notified 0800  --   Notification Type Page  --   Notification Reason Change in status  --   Response See new orders  --   Date of Provider Response 06/24/20  --   Time of Provider Response 0840  --   patient desat on HFNC and switched to nonrebreather.  Oxygenation 95-100%.  Patient states feels better.  MD aware and new orders given for IV lasix.

## 2020-06-24 NOTE — Consult Note (Signed)
Pulmonary Medicine          Date: 06/24/2020,   MRN# [id number] Eric Li 17-Jun-1935     AdmissionWeight: 53.5 kg                 CurrentWeight: 53.5 kg   Referring physician: Dr Roger Shelter   CHIEF COMPLAINT:   Acute hypoxemic respiratory failure   HISTORY OF PRESENT ILLNESS   As per admission h/p Eric Li is a 85 y.o. male with medical history significant for paroxysmal A. fib on apixaban, HTN, CAD, COPD and pulmonary fibrosis with chronic respiratory failure on home O2 at 3 L, who was brought to the emergency room by EMS because of confusion. Family called with concerns for patient waking up disoriented believing he was somewhere else and appeared to be not acting himself and " talking out of his head". Wife also voiced concern for possible UTI as his urination was frequent and with strong odor. He has had no fever or chills, or chest pain or shortness of breath. Has had no nausea, vomiting or change in bowel habits. He is fully vaccinated against Covid. On arrival, his vitals were within normal limits without fever or tachycardia and with O2 sat 97% on O2 at home flow rate . Blood work was significant for WBC of 11,700 and lactic acid of 2>>1.6. Urinalysis showed pyuria. Other blood work mostly unremarkable.  EKG  NSR with RBBB with nonspecific ST-T wave changes  Chest x-ray showed no acute findings, head CT no acute findings. Due to patient complaint of abdominal pain CT abdomen and pelvis with contrast was done which showed asymmetry of the rectum and adjacent portion of the sigmoid colon. Pulmonary consultation for worsening dyspnea with acute on chronic hypoxemic respiratory failure.    PAST MEDICAL HISTORY   Past Medical History:  Diagnosis Date  . Bilateral hearing loss 03/03/2016  . CAD (coronary artery disease) 02/24/2014  . CHF (congestive heart failure) ([place])   . COPD, moderate ([place]) 08/29/2015  . Hemorrhoids 02/24/2014  . HTN (hypertension) 02/24/2014    . Hyperlipidemia, unspecified 02/24/2014  . Nephrolithiasis   . SOB (shortness of breath) on exertion 03/27/2015  . Statin intolerance 03/30/2015     SURGICAL HISTORY   Past Surgical History:  Procedure Laterality Date  . CHOLECYSTECTOMY    . TONSILLECTOMY       FAMILY HISTORY   Family History  Problem Relation Age of Onset  . Bladder Cancer Neg Hx   . Prolactinoma Neg Hx   . Prostate cancer Neg Hx   . Kidney cancer Neg Hx      SOCIAL HISTORY   Social History   Tobacco Use  . Smoking status: Former Research scientist (life sciences)  . Smokeless tobacco: Never Used  Vaping Use  . Vaping Use: Never used  Substance Use Topics  . Alcohol use: No  . Drug use: No     MEDICATIONS    Home Medication:    Current Medication:  Current Facility-Administered Medications:  .  acetaminophen (TYLENOL) tablet 650 mg, 650 mg, Oral, Q6H PRN **OR** acetaminophen (TYLENOL) suppository 650 mg, 650 mg, Rectal, Q6H PRN, Athena Masse, MD .  acidophilus (RISAQUAD) capsule 1 capsule, 1 capsule, Oral, Daily, Shahmehdi, Seyed A, MD, 1 capsule at 06/24/20 0947 .  albuterol (PROVENTIL) (2.5 MG/3ML) 0.083% nebulizer solution 2.5 mg, 2.5 mg, Nebulization, Q6H PRN, Athena Masse, MD .  apixaban (ELIQUIS) tablet 2.5 mg, 2.5 mg, Oral, BID, Athena Masse, MD,  2.5 mg at 06/24/20 0940 .  cefTRIAXone (ROCEPHIN) 1 g in sodium chloride 0.9 % 100 mL IVPB, 1 g, Intravenous, Q24H, Oswald Hillock, RPH, Last Rate: 200 mL/hr at 06/23/20 2239, 1 g at 06/23/20 2239 .  diclofenac Sodium (VOLTAREN) 1 % topical gel 2 g, 2 g, Topical, BID PRN, Shahmehdi, Seyed A, MD .  diltiazem (CARDIZEM CD) 24 hr capsule 360 mg, 360 mg, Oral, Daily, Judd Gaudier V, MD, 360 mg at 06/24/20 0940 .  escitalopram (LEXAPRO) tablet 10 mg, 10 mg, Oral, Daily, Judd Gaudier V, MD, 10 mg at 06/24/20 0948 .  fluticasone (FLONASE) 50 MCG/ACT nasal spray 2 spray, 2 spray, Each Nare, Daily, Judd Gaudier V, MD, 2 spray at 06/23/20 1041 .  furosemide (LASIX)  tablet 10 mg, 10 mg, Oral, QODAY, Athena Masse, MD, 10 mg at 06/23/20 1041 .  metoprolol succinate (TOPROL-XL) 24 hr tablet 50 mg, 50 mg, Oral, Daily, Judd Gaudier V, MD, 50 mg at 06/23/20 1042 .  mometasone-formoterol (DULERA) 200-5 MCG/ACT inhaler 2 puff, 2 puff, Inhalation, BID, Athena Masse, MD, 2 puff at 06/23/20 2232 .  morphine 2 MG/ML injection 2 mg, 2 mg, Intravenous, Q2H PRN, Athena Masse, MD .  multivitamin with minerals tablet 1 tablet, 1 tablet, Oral, Daily, Athena Masse, MD, 1 tablet at 06/24/20 0940 .  nitroGLYCERIN (NITROSTAT) SL tablet 0.4 mg, 0.4 mg, Sublingual, Q5 min PRN, Judd Gaudier V, MD .  ondansetron (ZOFRAN) tablet 4 mg, 4 mg, Oral, Q6H PRN **OR** ondansetron (ZOFRAN) injection 4 mg, 4 mg, Intravenous, Q6H PRN, Athena Masse, MD .  pantoprazole (PROTONIX) EC tablet 40 mg, 40 mg, Oral, Daily, Judd Gaudier V, MD, 40 mg at 06/24/20 0939 .  [START ON 06/25/2020] predniSONE (DELTASONE) tablet 40 mg, 40 mg, Oral, Q breakfast, Shahmehdi, Seyed A, MD .  psyllium (HYDROCIL/METAMUCIL) 1 packet, 1 packet, Oral, Daily PRN, Athena Masse, MD .  traMADol (ULTRAM) tablet 50 mg, 50 mg, Oral, Q6H PRN, Shahmehdi, Seyed A, MD .  traZODone (DESYREL) tablet 50 mg, 50 mg, Oral, QHS PRN, Shahmehdi, Seyed A, MD, 50 mg at 06/22/20 1950    ALLERGIES   Simvastatin     REVIEW OF SYSTEMS    Review of Systems:  Gen:  Denies  fever, sweats, chills weigh loss  HEENT: Denies blurred vision, double vision, ear pain, eye pain, hearing loss, nose bleeds, sore throat Cardiac:  No dizziness, chest pain or heaviness, chest tightness,edema Resp:   Denies cough or sputum porduction, shortness of breath,wheezing, hemoptysis,  Gi: Denies swallowing difficulty, stomach pain, nausea or vomiting, diarrhea, constipation, bowel incontinence Gu:  Denies bladder incontinence, burning urine Ext:   Denies Joint pain, stiffness or swelling Skin: Denies  skin rash, easy bruising or bleeding  or hives Endoc:  Denies polyuria, polydipsia , polyphagia or weight change Psych:   Denies depression, insomnia or hallucinations   Other:  All other systems negative   VS: BP 139/77 (BP Location: Left Arm)   Pulse 68   Temp 98.5 F (36.9 C) (Axillary)   Resp 16   Ht 5\' 6"  (1.676 m)   Wt 53.5 kg   SpO2 99%   BMI 19.05 kg/m      PHYSICAL EXAM    GENERAL:NAD, no fevers, chills, no weakness no fatigue HEAD: Normocephalic, atraumatic.  EYES: Pupils equal, round, reactive to light. Extraocular muscles intact. No scleral icterus.  MOUTH: Moist mucosal membrane. Dentition intact. No abscess noted.  EAR, NOSE, THROAT: Clear  without exudates. No external lesions.  NECK: Supple. No thyromegaly. No nodules. No JVD.  PULMONARY: mild ronchi bilaterally  CARDIOVASCULAR: S1 and S2. Regular rate and rhythm. No murmurs, rubs, or gallops. No edema. Pedal pulses 2+ bilaterally.  GASTROINTESTINAL: Soft, nontender, nondistended. No masses. Positive bowel sounds. No hepatosplenomegaly.  MUSCULOSKELETAL: No swelling, clubbing, or edema. Range of motion full in all extremities.  NEUROLOGIC: Cranial nerves II through XII are intact. No gross focal neurological deficits. Sensation intact. Reflexes intact.  SKIN: No ulceration, lesions, rashes, or cyanosis. Skin warm and dry. Turgor intact.  PSYCHIATRIC: Mood, affect within normal limits. The patient is awake, alert and oriented x 3. Insight, judgment intact.       IMAGING    DG Chest 2 View  Result Date: 06/03/2020 CLINICAL DATA:  Suspect sepsis. EXAM: CHEST - 2 VIEW COMPARISON:  03/25/2019 FINDINGS: Heart size and vascularity normal. Mild bibasilar atelectasis. Small right effusion. Elevated left hemidiaphragm with bowel gas below the left diaphragm. Mild hyperinflation lungs. No mass lesion. No acute skeletal abnormality. IMPRESSION: Mild bibasilar atelectasis and small right effusion.   COPD. Electronically Signed   By: Franchot Gallo M.D.    On: 06/04/2020 13:35   CT Head Wo Contrast  Result Date: 05/26/2020 CLINICAL DATA:  Altered mental status. EXAM: CT HEAD WITHOUT CONTRAST TECHNIQUE: Contiguous axial images were obtained from the base of the skull through the vertex without intravenous contrast. COMPARISON:  March 16, 2018 FINDINGS: Brain: There is mild cerebral atrophy with widening of the extra-axial spaces and ventricular dilatation. There are areas of decreased attenuation within the white matter tracts of the supratentorial brain, consistent with microvascular disease changes. Vascular: No hyperdense vessel or unexpected calcification. Skull: Normal. Negative for fracture or focal lesion. Sinuses/Orbits: Very mild, bilateral ethmoid sinus mucosal thickening is seen. Other: None. IMPRESSION: 1. Generalized cerebral atrophy. 2. Very mild, bilateral ethmoid sinus disease. 3. No acute intracranial abnormality. Electronically Signed   By: Virgina Norfolk M.D.   On: 05/28/2020 21:11   MR PELVIS WO CONTRAST  Result Date: 06/24/2020 CLINICAL DATA:  Possible rectal mass on CT EXAM: MRI PELVIS WITHOUT CONTRAST TECHNIQUE: Multiplanar multisequence MR imaging of the pelvis was performed. No intravenous contrast was administered. Small amount of [place] gel was administered per rectum to optimize tumor evaluation. COMPARISON:  CT abdomen/pelvis dated 05/28/2020 FINDINGS: Urinary Tract:  Bladder is within normal limits. Bowel: Distension of the rectum with ultrasound gel. No rectal mass is seen. Visualized bowel is otherwise unremarkable, including the sigmoid colon. Vascular/Lymphatic: No evidence of aneurysm. No suspicious pelvic lymphadenopathy. Reproductive:  Suspected postsurgical changes related to prior TURP. Other:  No pelvic ascites. Musculoskeletal: Mild degenerative changes of the lower lumbar spine. IMPRESSION: No rectosigmoid mass is evident on MR. Negative pelvic MRI. Electronically Signed   By: Julian Hy M.D.   On: 06/24/2020 12:15    MR ABDOMEN W WO CONTRAST  Result Date: 06/24/2020 CLINICAL DATA:  Inpatient. Frequent UTI. COPD and pulmonary fibrosis with chronic respiratory failure. Confusion. Concern for rectal mass on recent CT. EXAM: MRI ABDOMEN WITHOUT AND WITH CONTRAST TECHNIQUE: Multiplanar multisequence MR imaging of the abdomen was performed both before and after the administration of intravenous contrast. CONTRAST:  7.2mL GADAVIST GADOBUTROL 1 MMOL/ML IV SOLN COMPARISON:  06/05/2020 CT abdomen/pelvis. FINDINGS: Lower chest: Small dependent bilateral pleural effusions, right greater than left. Hepatobiliary: Normal liver size and configuration. No hepatic steatosis. No liver mass. Cholecystectomy. Bile ducts are within normal post cholecystectomy limits. Common bile duct diameter 5  mm. No choledocholithiasis. No biliary masses, strictures or beading. Probable 12 mm diameter cystic duct remnant in the porta hepatis (series 4/image 14). Pancreas: No pancreatic mass or duct dilation.  No pancreas divisum. Spleen: Normal size. No mass. Adrenals/Urinary Tract: Normal adrenals. No hydronephrosis. Scattered simple subcentimeter renal cortical cysts in both kidneys. No suspicious renal masses. Stomach/Bowel: Normal non-distended stomach. Visualized small and large bowel is normal caliber, with no bowel wall thickening. Vascular/Lymphatic: Atherosclerotic nonaneurysmal abdominal aorta. Patent portal, splenic, hepatic and renal veins. Retroaortic left renal vein. No pathologically enlarged lymph nodes in the abdomen. Other: No abdominal ascites or focal fluid collection. Musculoskeletal: No aggressive appearing focal osseous lesions. Mild L4 vertebral compression fracture, chronic appearing. IMPRESSION: 1. No lymphadenopathy or other findings to suggest metastatic disease in the abdomen. MRI pelvis to be reported separately. 2. Bile ducts are within normal post cholecystectomy limits (CBD diameter 5 mm). No choledocholithiasis. Probable  small cystic duct remnant in the porta hepatis. 3. Small dependent bilateral pleural effusions, right greater than left. 4. Mild L4 vertebral compression fracture, chronic appearing. Electronically Signed   By: Ilona Sorrel M.D.   On: 06/24/2020 07:01   CT ABDOMEN PELVIS W CONTRAST  Result Date: 05/27/2020 CLINICAL DATA:  Abdominal pain. EXAM: CT ABDOMEN AND PELVIS WITH CONTRAST TECHNIQUE: Multidetector CT imaging of the abdomen and pelvis was performed using the standard protocol following bolus administration of intravenous contrast. CONTRAST:  158mL OMNIPAQUE IOHEXOL 300 MG/ML  SOLN COMPARISON:  None. FINDINGS: Lower chest: A trace amount of atelectasis is seen within the right lung base. A very small right pleural effusion is noted. Hepatobiliary: No focal liver abnormality is seen. Status post cholecystectomy. No biliary dilatation. Pancreas: Unremarkable. No pancreatic ductal dilatation or surrounding inflammatory changes. Spleen: Normal in size without focal abnormality. Adrenals/Urinary Tract: Adrenal glands are unremarkable. Kidneys are normal, without renal calculi, focal lesion, or hydronephrosis. Bladder is unremarkable. Stomach/Bowel: Stomach is within normal limits. Appendix appears normal. No evidence of bowel dilatation. There is marked severity thickening of the rectum and adjacent portion of the distal sigmoid colon (axial CT images 71 through 75, CT series number 2). This is slightly asymmetric in appearance, right greater than left. No associated inflammatory fat stranding is seen. Vascular/Lymphatic: There is marked severity calcification and atherosclerosis of the abdominal aorta and bilateral common iliac arteries, without evidence of aneurysmal dilatation. No enlarged abdominal or pelvic lymph nodes. Reproductive: The prostate gland is mildly enlarged. Other: No abdominal wall hernia or abnormality. No abdominopelvic ascites. Musculoskeletal: Moderate to marked severity multilevel  degenerative changes seen throughout the lumbar spine. IMPRESSION: 1. Asymmetric of the rectum and adjacent portion of the sigmoid colon, which may be secondary to an underlying mass. Correlation with physical examination and MRI is recommended. 2. Evidence of prior cholecystectomy. 3. Very small right pleural effusion. 4. Mildly enlarged prostate gland. 5. Moderate to marked severity multilevel degenerative changes throughout the lumbar spine. 6. Aortic atherosclerosis. Aortic Atherosclerosis (ICD10-I70.0). Electronically Signed   By: Virgina Norfolk M.D.   On: 06/07/2020 21:22   DG Chest Port 1 View  Result Date: 06/24/2020 CLINICAL DATA:  Shortness of breath, pulmonary fibrosis, altered mental status EXAM: PORTABLE CHEST 1 VIEW COMPARISON:  06/07/2020 FINDINGS: Mild cardiomegaly. Emphysema. Mild, diffuse interstitial opacity. Bibasilar fibrotic scarring. The visualized skeletal structures are unremarkable. IMPRESSION: Emphysema and bibasilar fibrotic change with mild, diffuse interstitial opacity, potentially edema. No focal airspace opacity. Electronically Signed   By: Eddie Candle M.D.   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    Patient/Family are satisfied with care plan and all questions have been answered.  This document was prepared using Dragon voice recognition  software and may include unintentional dictation errors.     Ottie Glazier, M.D.  Division of [facility]

## 2020-06-24 NOTE — Consult Note (Addendum)
PHARMACY CONSULT NOTE - FOLLOW UP  Pharmacy Consult for Electrolyte Monitoring and Replacement   Recent Labs: Potassium (mmol/L)  Date Value  06/24/2020 3.1 (L)   Calcium (mg/dL)  Date Value  06/24/2020 8.1 (L)   Albumin (g/dL)  Date Value  06/13/2020 3.1 (L)   Sodium (mmol/L)  Date Value  06/24/2020 138   Corrected Ca 8.82  Assessment: 85 yo male with E Coli UTI on Ceftriaxone.  Pharmacy has been consulted to monitor and replenish electrolytes.    1003 0739 K =2.9 - repleted with 40 meq KCl and started daily potasssium replenishment with 6meq KCl  1003 1910 K = 3.1  Goal of Therapy:  Electrolytes wnl's  Plan:  Will give patient KCl 5meq packets x 2 and recheck potassium with am labs.  Will also recheck magnesium.  Lu Duffel ,PharmD Clinical Pharmacist 06/24/2020 8:25 PM

## 2020-06-24 NOTE — Progress Notes (Signed)
Marland Kitchen  PROGRESS NOTE    Patient: Eric Li                            PCP: [name], MD                    DOB: 17-Nov-1934            DOA: 05/27/2020 EHM:[id number]             DOS: 06/24/2020, 11:14 AM   LOS: 3 days   Date of Service: The patient was seen and examined on 06/24/2020  Subjective:   The patient was seen and examined this morning.  Stable 100% nonrebreather mask, satting 100% Family present at bedside  Early this morning patient was satting in low 80s on 3 L of oxygen O2 sat improved on nonrebreather mask, 70 L   Was discussed with patient's wife family and the patient confirmed DNR/DNI status   Brief Narrative:   Eric Li is a 85 year old male with history of paroxysmal A. fib on apixaban, HTN, CAD, frequent UTIs, COPD and pulmonary fibrosis with chronic respiratory failure on home O2 at 3 L presenting with confusion and " talking out of his head".  Admitted for altered mental status likely due to UTI.    ED Course:  Vitals were within normal limits  Blood work was significant for WBC of 11,700 and lactic acid of 2>>1.6. Urinalysis showed pyuria.  EKG as reviewed by me : NSR with RBBB with nonspecific ST-T wave changes Chest x-ray showed no acute findings. Head CT no acute findings.  Due to patient complaint of abdominal pain CT abdomen and pelvis with contrast was done which showed asymmetry of the rectum and adjacent portion of the sigmoid colon.  Patient was started on Rocephin    Assessment & Plan:   Principal Problem:   Acute metabolic encephalopathy Active Problems:   CAD (coronary artery disease)   HTN (hypertension)   UTI (urinary tract infection)   PAF (paroxysmal atrial fibrillation) (HCC)   Pulmonary fibrosis (HCC)   Chronic respiratory failure with hypoxia (HCC)   COPD, severe (HCC)   Abnormal computed tomography angiography (CTA) of abdomen and pelvis   Acute on chronic respiratory failure  Pulmonary fibrosis (HCC)/  Chronic respiratory failure with hypoxia (HCC)/ COPD, severe (HCC)  -This morning on 3 L of oxygen, patient was satting in low 80s -Patient was placed on nonrebreather will 15 L -Stat chest chest x-ray -One-time IV 40 mg Lasix -Discussed with family, wife and the patient confirmed DNR/DNI status -Baseline O2 demand 3 L continuously at home -Continue DuoNeb treatment   Acute metabolic encephalopathy.  -Multifactorial likely UTI -E. coli, recurrent UTI, respiratory failure hypoxia -On admission met SIRS criteria -Sepsis ruled out -SIRS etiology resolved -Hemodynamically stable  -Improved mental status   CT head with no acute intracranial findings, and patient has no focal deficits -Patient did not meet sepsis criteria at this time. No fever or tachycardia though with elevated WBC and lactic acid of 2, but patient did complete a course of Cipro a couple weeks prior -Lactic acid 2.0 >> 1.6 -Continue IV Rocephin,  -Discontinuing IV fluid -Follow cultures >> urine culture greater than 100 K colonies of E. coli pansensitive with exception of clinical -Neurologic checks, fall and aspiration precautions     CAD (coronary artery disease) -Denies any chest pain -No EKG changes -Continue metoprolol, nitroglycerin, not currently on  Zetia    HTN (hypertension) -Diltiazem -Stable    PAF (paroxysmal atrial fibrillation) (HCC) -Continue apixaban, -Bradycardic this morning heart rate 54, with holding metoprolol    Abnormal computed tomography angiography (CTA) of abdomen and pelvis -Abnormality rectosigmoid area -Consider GI consulted -appreciate GI input -MRI of abdomen pelvis completed on 06/23/2020 -no comments on sigmoid mass   DVT prophylaxis: Apixaban Code Status: full code  Family Communication:  none --- called patient's wife Eric Li at home number and mobile phone [phone] no answer message was left Disposition Plan: Back to previous home environment Consults  called: none  Status:At the time of admission, it appears that the appropriate admission status for this patient is INPATIENT     Nutritional status:         Cultures; Blood Cultures x 2 >> NGT Sputum Culture >> > 100 K colonies of E. coli, pansensitive with exception of quinolones  Antimicrobials: 06/22/2020 IV Rocephin >>      Admission status:    Status is: Inpatient  Remains inpatient appropriate because:Inpatient level of care appropriate due to severity of illness   Dispo: The patient is from: ALF              Anticipated d/c is to: ALF              Anticipated d/c date is: 2 days              Patient currently is not medically stable to d/c.        Procedures:   No admission procedures for hospital encounter.     Antimicrobials:  Anti-infectives (From admission, onward)   Start     Dose/Rate Route Frequency Ordered Stop   06/22/20 2200  cefTRIAXone (ROCEPHIN) 1 g in sodium chloride 0.9 % 100 mL IVPB        1 g 200 mL/hr over 30 Minutes Intravenous Every 24 hours 06/20/2020 2248 06/27/20 2159   06/04/2020 2145  cefTRIAXone (ROCEPHIN) 2 g in sodium chloride 0.9 % 100 mL IVPB        2 g 200 mL/hr over 30 Minutes Intravenous  Once 06/18/2020 2142 05/25/2020 2249       Medication:  . acidophilus  1 capsule Oral Daily  . apixaban  2.5 mg Oral BID  . diltiazem  360 mg Oral Daily  . escitalopram  10 mg Oral Daily  . ezetimibe  10 mg Oral Q M,W,F  . fluticasone  2 spray Each Nare Daily  . furosemide  10 mg Oral QODAY  . LORazepam  1 mg Intravenous Once  . metoprolol succinate  50 mg Oral Daily  . mometasone-formoterol  2 puff Inhalation BID  . multivitamin with minerals  1 tablet Oral Daily  . pantoprazole  40 mg Oral Daily  . predniSONE  10 mg Oral Q breakfast    acetaminophen **OR** acetaminophen, albuterol, morphine injection, nitroGLYCERIN, ondansetron **OR** ondansetron (ZOFRAN) IV, psyllium, traZODone   Objective:   Vitals:   06/24/20 0027  06/24/20 0456 06/24/20 0743 06/24/20 0830  BP: 124/85 134/90 117/65 (!) 141/81  Pulse: 79 (!) 105 (!) 49 (!) 54  Resp: 18  20 (!) 24  Temp: (!) 97.5 F (36.4 C) (!) 97.4 F (36.3 C) 97.7 F (36.5 C) 98.3 F (36.8 C)  TempSrc: Oral Oral Oral Axillary  SpO2: 97% 96% 97% 100%  Weight:      Height:        Intake/Output Summary (Last 24 hours) at 06/24/2020 1114  Last data filed at 06/24/2020 1100 Gross per 24 hour  Intake --  Output 1250 ml  Net -1250 ml   Filed Weights   05/27/2020 1237  Weight: 53.5 kg     Examination:         Physical Exam:   General:  Alert, oriented, cooperative, complaining of shortness of breath  HEENT:  Normocephalic, PERRL, otherwise with in Normal limits   Neuro:  CNII-XII intact. , normal motor and sensation, reflexes intact   Lungs:   Clear to auscultation BL, Respirations unlabored, no wheezes / crackles  Cardio:    S1/S2, RRR, No murmure, No Rubs or Gallops   Abdomen:   Soft, non-tender, bowel sounds active all four quadrants,  no guarding or peritoneal signs.  Muscular skeletal:  Limited exam - in bed, able to move all 4 extremities, Normal strength,  2+ pulses,  symmetric, No pitting edema  Skin:  Dry, warm to touch, negative for any Rashes, No open wounds  Wounds: Please see nursing documentation            ------------------------------------------------------------------------------------------------------------------------------------------    LABs:  CBC Latest Ref Rng & Units 06/22/2020 06/08/2020 05/26/2020  WBC 4.0 - 10.5 K/uL 14.1(H) 11.7(H) 19.5(H)  Hemoglobin 13.0 - 17.0 g/dL 14.1 14.3 14.7  Hematocrit 39 - 52 % 43.7 42.0 45.0  Platelets 150 - 400 K/uL 216 201 189   CMP Latest Ref Rng & Units 06/24/2020 06/23/2020 06/22/2020  Glucose 70 - 99 mg/dL 146(H) 145(H) 89  BUN 8 - 23 mg/dL _0 Creatinine 0.61 - 1.24 mg/dL 0.73 0.74 0.65  Sodium 135 - 145 mmol/L 138 138 140  Potassium 3.5 - 5.1 mmol/L 2.9(L) 3.5 3.6   Chloride 98 - 111 mmol/L 106 109 107  CO2 22 - 32 mmol/L 22 17(L) 20(L)  Calcium 8.9 - 10.3 mg/dL 8.1(L) 8.6(L) 8.3(L)  Total Protein 6.5 - 8.1 g/dL - - -  Total Bilirubin 0.3 - 1.2 mg/dL - - -  Alkaline Phos 38 - 126 U/L - - -  AST 15 - 41 U/L - - -  ALT 0 - 44 U/L - - -       Micro Results Recent Results (from the past 240 hour(s))  Culture, blood (Routine x 2)     Status: None (Preliminary result)   Collection Time: 06/12/2020  1:07 PM   Specimen: BLOOD LEFT ARM  Result Value Ref Range Status   Specimen Description BLOOD LEFT ARM  Final   Special Requests   Final    BOTTLES DRAWN AEROBIC AND ANAEROBIC Blood Culture results may not be optimal due to an excessive volume of blood received in culture bottles   Culture   Final    NO GROWTH 3 DAYS Performed at [facility], [address] [place], [place] [address]    Report Status PENDING  Incomplete  Culture, blood (Routine x 2)     Status: None (Preliminary result)   Collection Time: 05/28/2020  8:37 PM   Specimen: BLOOD  Result Value Ref Range Status   Specimen Description BLOOD RIGHT ANTECUBITAL  Final   Special Requests   Final    BOTTLES DRAWN AEROBIC AND ANAEROBIC Blood Culture adequate volume   Culture   Final    NO GROWTH 3 DAYS Performed at [facility], [address] [place], [place] [address]    Report Status PENDING  Incomplete  Urine Culture     Status: Abnormal   Collection Time: 06/12/2020  8:37 PM   Specimen: Urine, Random  Result Value Ref Range Status   Specimen Description   Final    URINE, RANDOM Performed at [facility], [address] [place], [place] [address]    Special Requests   Final    NONE Performed at [facility], [address] [place], [place] [address]    Culture >=100,000 COLONIES/mL ESCHERICHIA COLI (A)  Final   Report Status 06/23/2020 FINAL  Final   Organism ID, Bacteria ESCHERICHIA COLI (A)  Final      Susceptibility   Escherichia  coli - MIC*    AMPICILLIN <=2 SENSITIVE Sensitive     CEFAZOLIN <=4 SENSITIVE Sensitive     CEFTRIAXONE <=0.25 SENSITIVE Sensitive     CIPROFLOXACIN >=4 RESISTANT Resistant     GENTAMICIN <=1 SENSITIVE Sensitive     IMIPENEM <=0.25 SENSITIVE Sensitive     NITROFURANTOIN <=16 SENSITIVE Sensitive     TRIMETH/SULFA <=20 SENSITIVE Sensitive     AMPICILLIN/SULBACTAM <=2 SENSITIVE Sensitive     PIP/TAZO <=4 SENSITIVE Sensitive     * >=100,000 COLONIES/mL ESCHERICHIA COLI  Respiratory Panel by RT PCR (Flu A&B, Covid) - Nasopharyngeal Swab     Status: None   Collection Time: 06/18/2020  8:46 PM   Specimen: Nasopharyngeal Swab  Result Value Ref Range Status   SARS Coronavirus 2 by RT PCR NEGATIVE NEGATIVE Final    Comment: (NOTE) SARS-CoV-2 target nucleic acids are NOT DETECTED.  The SARS-CoV-2 RNA is generally detectable in upper respiratoy specimens during the acute phase of infection. The lowest concentration of SARS-CoV-2 viral copies this assay can detect is 131 copies/mL. A negative result does not preclude SARS-Cov-2 infe     Influenza A by PCR NEGATIVE NEGATIVE Final   Influenza B by PCR NEGATIVE NEGATIVE Final    Comment: (NOTE) The Xpert Xpress SARS-CoV-2/FLU/RSV assay is intended as an aid in  the diagnosis of influenza from Nasopharyngeal swab specimens and  should not be used as a sole basis for treatment. Nasal washi    Radiology Reports DG Chest 2 View  Result Date: 05/25/2020 CLINICAL DATA:  Suspect sepsis. EXAM: CHEST - 2 VIEW COMPARISON:  03/25/2019 FINDINGS: Heart size and vascularity normal. Mild bibasilar atelectasis. Small right effusion. Elevated left hemidiaphragm with bowel gas below the left diaphragm. Mild hyperinflation lungs. No mass lesion. No acute skeletal abnormality. IMPRESSION: Mild bibasilar atelectasis and small right effusion.   COPD. Electronically Signed   By: Franchot Gallo M.D.   On: 06/06/2020 13:35   CT Head Wo Contrast  Result Date:  06/01/2020 CLINICAL DATA:  Altered mental status. EXAM: CT HEAD WITHOUT CONTRAST TECHNIQUE: Contiguous axial images were obtained from the base of the skull through the vertex without intravenous contrast. COMPARISON:  March 16, 2018 FINDINGS: Brain: There is mild cerebral atrophy with widening of the extra-axial spaces and ventricular dilatation. There are areas of decreased attenuation within the white matter tracts of the supratentorial brain, consistent with microvascular disease changes. Vascular: No hyperdense vessel or unexpected calcification. Skull: Normal. Negative for fracture or focal lesion. Sinuses/Orbits: Very mild, bilateral ethmoid sinus mucosal thickening is seen. Other: None. IMPRESSION: 1. Generalized cerebral atrophy. 2. Very mild, bilateral ethmoid sinus disease. 3. No acute intracranial abnormality. Electronically Signed   By: Virgina Norfolk M.D.   On: 06/15/2020 21:11   MR ABDOMEN W WO CONTRAST  Result Date: 06/24/2020 CLINICAL DATA:  Inpatient. Frequent UTI. COPD and pulmonary fibrosis with chronic respiratory failure. Confusion. Concern for rectal  mass on recent CT. EXAM: MRI ABDOMEN WITHOUT AND WITH CONTRAST TECHNIQUE: Multiplanar multisequence MR imaging of the abdomen was performed both before and after the administration of intravenous contrast. CONTRAST:  7.51m GADAVIST GADOBUTROL 1 MMOL/ML IV SOLN COMPARISON:  05/24/2020 CT abdomen/pelvis. FINDINGS: Lower chest: Small dependent bilateral pleural effusions, right greater than left. Hepatobiliary: Normal liver size and configuration. No hepatic steatosis. No liver mass. Cholecystectomy. Bile ducts are within normal post cholecystectomy limits. Common bile duct diameter 5 mm. No choledocholithiasis. No biliary masses, strictures or beading. Probable 12 mm diameter cystic duct remnant in the porta hepatis (series 4/image 14). Pancreas: No pancreatic mass or duct dilation.  No pancreas divisum. Spleen: Normal size. No mass.  Adrenals/Urinary Tract: Normal adrenals. No hydronephrosis. Scattered simple subcentimeter renal cortical cysts in both kidneys. No suspicious renal masses. Stomach/Bowel: Normal non-distended stomach. Visualized small and large bowel is normal caliber, with no bowel wall thickening. Vascular/Lymphatic: Atherosclerotic nonaneurysmal abdominal aorta. Patent portal, splenic, hepatic and renal veins. Retroaortic left renal vein. No pathologically enlarged lymph nodes in the abdomen. Other: No abdominal ascites or focal fluid collection. Musculoskeletal: No aggressive appearing focal osseous lesions. Mild L4 vertebral compression fracture, chronic appearing. IMPRESSION: 1. No lymphadenopathy or other findings to suggest metastatic disease in the abdomen. MRI pelvis to be reported separately. 2. Bile ducts are within normal post cholecystectomy limits (CBD diameter 5 mm). No choledocholithiasis. Probable small cystic duct remnant in the porta hepatis. 3. Small dependent bilateral pleural effusions, right greater than left. 4. Mild L4 vertebral compression fracture, chronic appearing. Electronically Signed   By: JIlona SorrelM.D.   On: 06/24/2020 07:01   CT ABDOMEN PELVIS W CONTRAST  Result Date: 06/11/2020 CLINICAL DATA:  Abdominal pain. EXAM: CT ABDOMEN AND PELVIS WITH CONTRAST TECHNIQUE: Multidetector CT imaging of the abdomen and pelvis was performed using the standard protocol following bolus administration of intravenous contrast. CONTRAST:  1019mOMNIPAQUE IOHEXOL 300 MG/ML  SOLN COMPARISON:  None. FINDINGS: Lower chest: A trace amount of atelectasis is seen within the right lung base. A very small right pleural effusion is noted. Hepatobiliary: No focal liver abnormality is seen. Status post cholecystectomy. No biliary dilatation. Pancreas: Unremarkable. No pancreatic ductal dilatation or surrounding inflammatory changes. Spleen: Normal in size without focal abnormality. Adrenals/Urinary Tract: Adrenal glands  are unremarkable. Kidneys are normal, without renal calculi, focal lesion, or hydronephrosis. Bladder is unremarkable. Stomach/Bowel: Stomach is within normal limits. Appendix appears normal. No evidence of bowel dilatation. There is marked severity thickening of the rectum and adjacent portion of the distal sigmoid colon (axial CT images 71 through 75, CT series number 2). This is slightly asymmetric in appearance, right greater than left. No associated inflammatory fat stranding is seen. Vascular/Lymphatic: There is marked severity calcification and atherosclerosis of the abdominal aorta and bilateral common iliac arteries, without evidence of aneurysmal dilatation. No enlarged abdominal or pelvic lymph nodes. Reproductive: The prostate gland is mildly enlarged. Other: No abdominal wall hernia or abnormality. No abdominopelvic ascites. Musculoskeletal: Moderate to marked severity multilevel degenerative changes seen throughout the lumbar spine. IMPRESSION: 1. Asymmetric of the rectum and adjacent portion of the sigmoid colon, which may be secondary to an underlying mass. Correlation with physical examination and MRI is recommended. 2. Evidence of prior cholecystectomy. 3. Very small right pleural effusion. 4. Mildly enlarged prostate gland. 5. Moderate to marked severity multilevel degenerative changes throughout the lumbar spine. 6. Aortic atherosclerosis. Aortic Atherosclerosis (ICD10-I70.0). Electronically Signed   By: ThVirgina Norfolk.D.   On:  05/23/2020 21:22   DG Chest Port 1 View  Result Date: 06/24/2020 CLINICAL DATA:  Shortness of breath, pulmonary fibrosis, altered mental status EXAM: PORTABLE CHEST 1 VIEW COMPARISON:  06/09/2020 FINDINGS: Mild cardiomegaly. Emphysema. Mild, diffuse interstitial opacity. Bibasilar fibrotic scarring. The visualized skeletal structures are unremarkable. IMPRESSION: Emphysema and bibasilar fibrotic change with mild, diffuse interstitial opacity, potentially edema. No  focal airspace opacity. Electronically Signed   By: Eddie Candle M.D.   On: 06/24/2020 09:21    SIGNED: Deatra James, MD, FACP, FHM. [facility],  Pager (please use [email or web address] to page/text)  If 7PM-7AM, please contact night-coverage [email or web address], Password [facility] 06/24/2020, 11:14 AM

## 2020-06-25 ENCOUNTER — Encounter: Payer: Self-pay | Admitting: Internal Medicine

## 2020-06-25 ENCOUNTER — Inpatient Hospital Stay: Payer: PPO

## 2020-06-25 DIAGNOSIS — Z515 Encounter for palliative care: Secondary | ICD-10-CM | POA: Diagnosis not present

## 2020-06-25 DIAGNOSIS — Z66 Do not resuscitate: Secondary | ICD-10-CM

## 2020-06-25 DIAGNOSIS — Z7189 Other specified counseling: Secondary | ICD-10-CM | POA: Diagnosis not present

## 2020-06-25 DIAGNOSIS — J9611 Chronic respiratory failure with hypoxia: Secondary | ICD-10-CM

## 2020-06-25 LAB — BASIC METABOLIC PANEL
Anion gap: 11 (ref 5–15)
BUN: 18 mg/dL (ref 8–23)
CO2: 24 mmol/L (ref 22–32)
Calcium: 8.6 mg/dL — ABNORMAL LOW (ref 8.9–10.3)
Chloride: 104 mmol/L (ref 98–111)
Creatinine, Ser: 0.67 mg/dL (ref 0.61–1.24)
GFR calc Af Amer: >60 mL/min (ref >60)
GFR calc non Af Amer: >60 mL/min (ref >60)
Glucose, Bld: 231 mg/dL — ABNORMAL HIGH (ref 70–99)
Potassium: 3.8 mmol/L (ref 3.5–5.1)
Sodium: 139 mmol/L (ref 135–145)

## 2020-06-25 LAB — CBC
HCT: 44.4 % (ref 39.0–52.0)
Hemoglobin: 15.1 g/dL (ref 13.0–17.0)
MCH: 29.9 pg (ref 26.0–34.0)
MCHC: 34 g/dL (ref 30.0–36.0)
MCV: 87.9 fL (ref 80.0–100.0)
Platelets: 210 10*3/uL (ref 150–400)
RBC: 5.05 MIL/uL (ref 4.22–5.81)
RDW: 16.8 % — ABNORMAL HIGH (ref 11.5–15.5)
WBC: 14.5 10*3/uL — ABNORMAL HIGH (ref 4.0–10.5)
nRBC: 0 % (ref 0.0–0.2)

## 2020-06-25 LAB — URINE CULTURE

## 2020-06-25 LAB — GLUCOSE, CAPILLARY
Glucose-Capillary: 229 mg/dL — ABNORMAL HIGH (ref 70–99)
Glucose-Capillary: 165 mg/dL — ABNORMAL HIGH (ref 70–99)
Glucose-Capillary: 167 mg/dL — ABNORMAL HIGH (ref 70–99)

## 2020-06-25 LAB — HEMOGLOBIN A1C
Hgb A1c MFr Bld: 6.9 % — ABNORMAL HIGH (ref 4.8–5.6)
Mean Plasma Glucose: 151.33 mg/dL

## 2020-06-25 LAB — MAGNESIUM: Magnesium: 2.6 mg/dL — ABNORMAL HIGH (ref 1.7–2.4)

## 2020-06-25 IMAGING — DX DG CHEST 1V
1 series · 1 of 1 positions shown · non-contrast
Comparison: [DATE]

CLINICAL DATA: Shortness of breath

EXAM:
CHEST  1 VIEW

[chest ap]
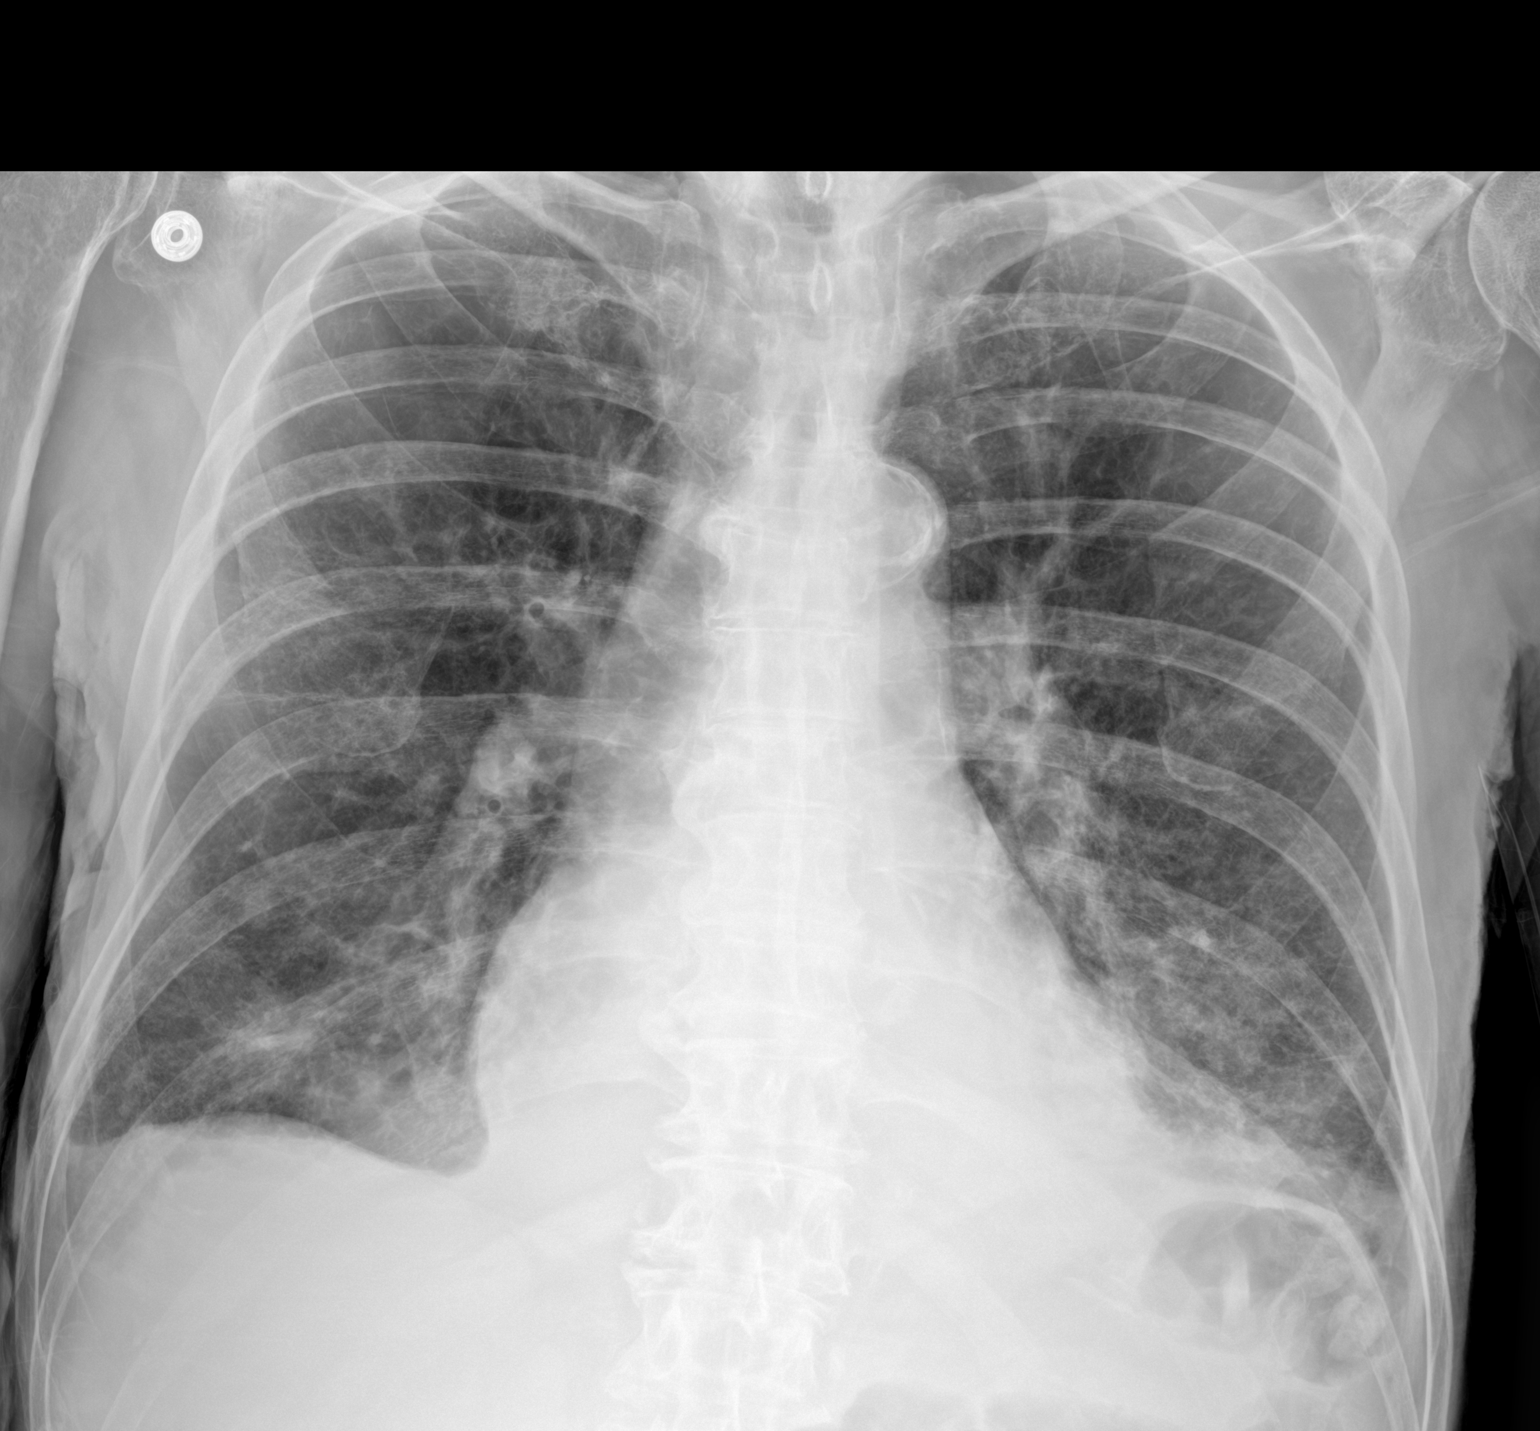

[1 of 1 positions shown; findings below may reference images not displayed]

FINDINGS: The heart size and mediastinal contours are unchanged with mild
cardiomegaly. Aortic knob calcifications are seen. Again noted is
hyperinflation of the lung zones with flattening of the
hemidiaphragms. There is mildly increased interstitial opacities
seen at both lower lungs. There is new hazy airspace opacity seen at
the left lung base. No acute osseous abnormality.
IMPRESSION: Increased hazy airspace opacity at the left lung base which could be
due to atelectasis and/or infectious etiology.

Probable chronic interstitial opacities at both lungs.

## 2020-06-25 MED ORDER — FUROSEMIDE 10 MG/ML IJ SOLN
20.0000 mg | Freq: Once | INTRAMUSCULAR | Status: AC
  Administered 2020-06-25: 20 mg via INTRAVENOUS
  Filled 2020-06-25: qty 4

## 2020-06-25 MED ORDER — INSULIN ASPART 100 UNIT/ML ~~LOC~~ SOLN
0.0000 [IU] | Freq: Three times a day (TID) | SUBCUTANEOUS | Status: DC
  Administered 2020-06-25 – 2020-06-26 (×4): 2 [IU] via SUBCUTANEOUS
  Administered 2020-06-27 – 2020-06-28 (×3): 1 [IU] via SUBCUTANEOUS
  Administered 2020-06-28 – 2020-06-29 (×3): 2 [IU] via SUBCUTANEOUS
  Administered 2020-06-29: 3 [IU] via SUBCUTANEOUS
  Administered 2020-06-30: 2 [IU] via SUBCUTANEOUS
  Administered 2020-06-30: 1 [IU] via SUBCUTANEOUS
  Administered 2020-06-30: 2 [IU] via SUBCUTANEOUS
  Administered 2020-07-01 (×2): 1 [IU] via SUBCUTANEOUS
  Administered 2020-07-02 (×2): 2 [IU] via SUBCUTANEOUS
  Administered 2020-07-02 – 2020-07-03 (×2): 3 [IU] via SUBCUTANEOUS
  Administered 2020-07-03: 2 [IU] via SUBCUTANEOUS
  Administered 2020-07-03: 3 [IU] via SUBCUTANEOUS
  Administered 2020-07-04 – 2020-07-05 (×3): 2 [IU] via SUBCUTANEOUS
  Administered 2020-07-06: 3 [IU] via SUBCUTANEOUS
  Administered 2020-07-06: 2 [IU] via SUBCUTANEOUS
  Filled 2020-06-25 (×30): qty 1

## 2020-06-25 MED ORDER — LEVALBUTEROL HCL 0.63 MG/3ML IN NEBU
0.6300 mg | INHALATION_SOLUTION | Freq: Four times a day (QID) | RESPIRATORY_TRACT | Status: DC | PRN
  Administered 2020-06-27: 0.63 mg via RESPIRATORY_TRACT
  Filled 2020-06-25 (×3): qty 3

## 2020-06-25 MED ORDER — MORPHINE SULFATE (PF) 2 MG/ML IV SOLN
1.0000 mg | Freq: Once | INTRAVENOUS | Status: AC
  Administered 2020-06-25: 1 mg via INTRAVENOUS
  Filled 2020-06-25: qty 1

## 2020-06-25 NOTE — Consult Note (Signed)
Consultation Note Date: 06/25/2020   Patient Name: Eric Li  DOB: July 14, 1935  MRN: [id number]  Age / Sex: 85 y.o., male  PCP: [name], MD Referring Physician: Deatra James, MD  Reason for Consultation: Establishing goals of care  HPI/Patient Profile: 85 y.o. male  with past medical history of a fib, HTN, CAD, COPD, and pulmonary fibrosis on 2-3 L oxygen at home admitted on 06/10/2020 with AMS. Found to have UTI - recurrent - treated with IV antibiotics. Also with increased oxygen requirement - now on HFNC. PMT consulted to discuss [place].  Clinical Assessment and Goals of Care: I have reviewed medical records including EPIC notes, labs and imaging, received report from Dr. Roger Shelter, assessed the patient and then met with patient's wife and daughter to discuss diagnosis prognosis, GOC, EOL wishes, disposition and options.  I introduced Palliative Medicine as specialized medical care for people living with serious illness. It focuses on providing relief from the symptoms and stress of a serious illness. The goal is to improve quality of life for both the patient and the family.  As far as functional and nutritional status, they tell me patient is mostly nonambulatory - multiple falls with attempts to walk. Needs assistance for all ADLs. More confusion at home. Very poor appetite. Family describes a steady decline since January.    We discussed patient's current illness and what it means in the larger context of patient's on-going co-morbidities.  Natural disease trajectory and expectations at EOL were discussed. We discussed he dependence on high amounts of oxygen. We also discussed his decline in function, nutrition, and cognition. Discussed concern that patient is nearing end of life.   I attempted to elicit values and goals of care important to the patient. The difference between aggressive medical intervention and comfort care  was considered in light of the patient's goals of care. Family would like to continue current interventions but are open to continued involvement by PMT for ongoing discussion about transition to comfort measures only.   Discussed with family the importance of continued conversation with family and the medical providers regarding overall plan of care and treatment options, ensuring decisions are within the context of the patient's values and GOCs.    Questions and concerns were addressed. The family was encouraged to call with questions or concerns.   Primary Decision Maker NEXT OF KIN - spouse  SUMMARY OF RECOMMENDATIONS   - continue current measures, ongoing [place] discussions  Code Status/Advance Care Planning:  DNR  Prognosis:   Unable to determine - at high risk for acute decline  Discharge Planning: To Be Determined      Primary Diagnoses: Present on Admission: . CAD (coronary artery disease) . UTI (urinary tract infection) . HTN (hypertension) . Chronic respiratory failure with hypoxia ([place]) . COPD, severe ([place]) . Pulmonary fibrosis ([place]) . PAF (paroxysmal atrial fibrillation) ([place])   I have reviewed the medical record, interviewed the patient and family, and examined the patient. The following aspects are pertinent.  Past Medical History:  Diagnosis Date  . Bilateral hearing loss 03/03/2016  . CAD (coronary artery disease) 02/24/2014  . CHF (congestive heart failure) ([place])   . COPD, moderate ([place]) 08/29/2015  . Hemorrhoids 02/24/2014  . HTN (hypertension) 02/24/2014  . Hyperlipidemia, unspecified 02/24/2014  . Nephrolithiasis   . SOB (shortness of breath) on exertion 03/27/2015  . Statin intolerance 03/30/2015   Social History   Socioeconomic History  . Marital status: Married    Spouse name: Not  on file  . Number of children: Not on file  . Years of education: Not on file  . Highest education level: Not on file  Occupational History  . Not on file  Tobacco Use  .  Smoking status: Former Research scientist (life sciences)  . Smokeless tobacco: Never Used  Vaping Use  . Vaping Use: Never used  Substance and Sexual Activity  . Alcohol use: No  . Drug use: No  . Sexual activity: Not on file  Other Topics Concern  . Not on file  Social History Narrative  . Not on file   Social Determinants of Health   Financial Resource Strain:   . Difficulty of Paying Living Expenses: Not on file  Food Insecurity:   . Worried About Charity fundraiser in the Last Year: Not on file  . Ran Out of Food in the Last Year: Not on file  Transportation Needs:   . Lack of Transportation (Medical): Not on file  . Lack of Transportation (Non-Medical): Not on file  Physical Activity:   . Days of Exercise per Week: Not on file  . Minutes of Exercise per Session: Not on file  Stress:   . Feeling of Stress : Not on file  Social Connections:   . Frequency of Communication with Friends and Family: Not on file  . Frequency of Social Gatherings with Friends and Family: Not on file  . Attends Religious Services: Not on file  . Active Member of Clubs or Organizations: Not on file  . Attends Archivist Meetings: Not on file  . Marital Status: Not on file   Family History  Problem Relation Age of Onset  . Bladder Cancer Neg Hx   . Prolactinoma Neg Hx   . Prostate cancer Neg Hx   . Kidney cancer Neg Hx    Scheduled Meds: . acidophilus  1 capsule Oral Daily  . apixaban  2.5 mg Oral BID  . diltiazem  360 mg Oral Daily  . escitalopram  10 mg Oral Daily  . fluticasone  2 spray Each Nare Daily  . furosemide  20 mg Intravenous Q12H  . insulin aspart  0-9 Units Subcutaneous TID WC  . methylPREDNISolone (SOLU-MEDROL) injection  40 mg Intravenous Q24H  . metoprolol succinate  25 mg Oral Daily  . mometasone-formoterol  2 puff Inhalation BID  . multivitamin with minerals  1 tablet Oral Daily  . pantoprazole  40 mg Oral Daily  . potassium chloride  20 mEq Oral Daily   Continuous Infusions: .  cefTRIAXone (ROCEPHIN)  IV Stopped (06/24/20 2141)   PRN Meds:.acetaminophen **OR** acetaminophen, diclofenac Sodium, levalbuterol, nitroGLYCERIN, ondansetron **OR** ondansetron (ZOFRAN) IV, psyllium, traMADol, traZODone Allergies  Allergen Reactions  . Simvastatin    Review of Systems  Unable to perform ROS: Mental status change    Physical Exam Constitutional:      Comments: lethargic  Pulmonary:     Effort: Pulmonary effort is normal.     Comments: On HFNC Skin:    General: Skin is warm and dry.     Vital Signs: BP (!) 157/131 (BP Location: Left Arm)   Pulse (!) 108   Temp 97.7 F (36.5 C) (Oral)   Resp 20   Ht _0  (1.676 m)   Wt 53.5 kg   SpO2 99%   BMI 19.05 kg/m  Pain Scale: 0-10   Pain Score: 0-No pain   SpO2: SpO2: 99 % O2 Device:SpO2: 99 % O2 Flow Rate: .O2 Flow Rate (L/min):  60 L/min  IO: Intake/output summary:   Intake/Output Summary (Last 24 hours) at 06/25/2020 1621 Last data filed at 06/25/2020 0531 Gross per 24 hour  Intake 200 ml  Output 1100 ml  Net -900 ml    LBM: Last BM Date: 06/25/20 Baseline Weight: Weight: 53.5 kg Most recent weight: Weight: 53.5 kg     Palliative Assessment/Data: PPS 20%    Time Total: 55 minutes Greater than 50%  of this time was spent counseling and coordinating care related to the above assessment and plan.  Juel Burrow, DNP, AGNP-C Palliative Medicine Team [phone] Pager: [phone]

## 2020-06-25 NOTE — Progress Notes (Signed)
Called to assess patient. Upon arrival, patient had SpO2 of 78% on NRB with pale color. Bilateral breath sounds with crackles on the left. Provider being called to bedside. Started patient on Heated High Flow 55L @ 100%. SpO2 up to 85%. Provider ordering lasix and Morphine. Will continue to monitor.

## 2020-06-25 NOTE — Progress Notes (Signed)
Pulmonary Medicine          Date: 06/25/2020,   MRN# [id number] Eric Li [birth year]     AdmissionWeight: 53.5 kg                 CurrentWeight: 53.5 kg   Referring physician: Dr Roger Shelter   CHIEF COMPLAINT:   Acute hypoxemic respiratory failure   HISTORY OF PRESENT ILLNESS   As per admission h/p Eric Li is a 85 y.o. male with medical history significant for paroxysmal A. fib on apixaban, HTN, CAD, COPD and pulmonary fibrosis with chronic respiratory failure on home O2 at 3 L, who was brought to the emergency room by EMS because of confusion. Family called with concerns for patient waking up disoriented believing he was somewhere else and appeared to be not acting himself and " talking out of his head". Wife also voiced concern for possible UTI as his urination was frequent and with strong odor. He has had no fever or chills, or chest pain or shortness of breath. Has had no nausea, vomiting or change in bowel habits. He is fully vaccinated against Covid. On arrival, his vitals were within normal limits without fever or tachycardia and with O2 sat 97% on O2 at home flow rate . Blood work was significant for WBC of 11,700 and lactic acid of 2>>1.6. Urinalysis showed pyuria. Other blood work mostly unremarkable.  EKG  NSR with RBBB with nonspecific ST-T wave changes  Chest x-ray showed no acute findings, head CT no acute findings. Due to patient complaint of abdominal pain CT abdomen and pelvis with contrast was done which showed asymmetry of the rectum and adjacent portion of the sigmoid colon. Pulmonary consultation for worsening dyspnea with acute on chronic hypoxemic respiratory failure.   06/25/20- patient evaluated at bedside this afternoon.  He had repeat CXR overnight with worsening LLL infiltrate.  Family member at bedside (daughter) shares that he had desatutation event after eating. This suggests aspiration. He made adequate urine with lasix with notable  improvement after diuresis as per family.  MetNEB was unable to be used due to patient being sleepy unable to participate.     PAST MEDICAL HISTORY   Past Medical History:  Diagnosis Date  . Bilateral hearing loss 03/03/2016  . CAD (coronary artery disease) 02/24/2014  . CHF (congestive heart failure) ([place])   . COPD, moderate ([place]) 08/29/2015  . Hemorrhoids 02/24/2014  . HTN (hypertension) 02/24/2014  . Hyperlipidemia, unspecified 02/24/2014  . Nephrolithiasis   . SOB (shortness of breath) on exertion 03/27/2015  . Statin intolerance 03/30/2015     SURGICAL HISTORY   Past Surgical History:  Procedure Laterality Date  . CHOLECYSTECTOMY    . TONSILLECTOMY       FAMILY HISTORY   Family History  Problem Relation Age of Onset  . Bladder Cancer Neg Hx   . Prolactinoma Neg Hx   . Prostate cancer Neg Hx   . Kidney cancer Neg Hx      SOCIAL HISTORY   Social History   Tobacco Use  . Smoking status: Former Research scientist (life sciences)  . Smokeless tobacco: Never Used  Vaping Use  . Vaping Use: Never used  Substance Use Topics  . Alcohol use: No  . Drug use: No     MEDICATIONS    Home Medication:    Current Medication:  Current Facility-Administered Medications:  .  acetaminophen (TYLENOL) tablet 650 mg, 650 mg, Oral, Q6H PRN, 650 mg at 06/24/20 1711 **  OR** acetaminophen (TYLENOL) suppository 650 mg, 650 mg, Rectal, Q6H PRN, Judd Gaudier V, MD .  acidophilus (RISAQUAD) capsule 1 capsule, 1 capsule, Oral, Daily, Shahmehdi, Seyed A, MD, 1 capsule at 06/25/20 1034 .  apixaban (ELIQUIS) tablet 2.5 mg, 2.5 mg, Oral, BID, Judd Gaudier V, MD, 2.5 mg at 06/25/20 1035 .  cefTRIAXone (ROCEPHIN) 1 g in sodium chloride 0.9 % 100 mL IVPB, 1 g, Intravenous, Q24H, Oswald Hillock, RPH, Stopped at 06/24/20 2141 .  diclofenac Sodium (VOLTAREN) 1 % topical gel 2 g, 2 g, Topical, BID PRN, Shahmehdi, Seyed A, MD .  diltiazem (CARDIZEM CD) 24 hr capsule 360 mg, 360 mg, Oral, Daily, Judd Gaudier V, MD, 360 mg at  06/25/20 1034 .  escitalopram (LEXAPRO) tablet 10 mg, 10 mg, Oral, Daily, Judd Gaudier V, MD, 10 mg at 06/25/20 1035 .  fluticasone (FLONASE) 50 MCG/ACT nasal spray 2 spray, 2 spray, Each Nare, Daily, Judd Gaudier V, MD, 2 spray at 06/25/20 1035 .  furosemide (LASIX) injection 20 mg, 20 mg, Intravenous, Q12H, Ottie Glazier, MD, 20 mg at 06/24/20 1901 .  levalbuterol (XOPENEX) nebulizer solution 0.63 mg, 0.63 mg, Nebulization, Q6H PRN, [place], Bing Neighbors, NP .  methylPREDNISolone sodium succinate (SOLU-MEDROL) 40 mg/mL injection 40 mg, 40 mg, Intravenous, Q24H, Lanney Gins, Jarone Ostergaard, MD, 40 mg at 06/24/20 1900 .  metoprolol succinate (TOPROL-XL) 24 hr tablet 25 mg, 25 mg, Oral, Daily, Flynn Lininger, MD, 25 mg at 06/25/20 1034 .  mometasone-formoterol (DULERA) 200-5 MCG/ACT inhaler 2 puff, 2 puff, Inhalation, BID, Athena Masse, MD, 2 puff at 06/25/20 1039 .  multivitamin with minerals tablet 1 tablet, 1 tablet, Oral, Daily, Athena Masse, MD, 1 tablet at 06/25/20 1035 .  nitroGLYCERIN (NITROSTAT) SL tablet 0.4 mg, 0.4 mg, Sublingual, Q5 min PRN, Judd Gaudier V, MD .  ondansetron (ZOFRAN) tablet 4 mg, 4 mg, Oral, Q6H PRN **OR** ondansetron (ZOFRAN) injection 4 mg, 4 mg, Intravenous, Q6H PRN, Athena Masse, MD .  pantoprazole (PROTONIX) EC tablet 40 mg, 40 mg, Oral, Daily, Judd Gaudier V, MD, 40 mg at 06/25/20 1034 .  potassium chloride 20 MEQ/15ML (10%) solution 20 mEq, 20 mEq, Oral, Daily, Reily Treloar, MD, 20 mEq at 06/25/20 1035 .  psyllium (HYDROCIL/METAMUCIL) 1 packet, 1 packet, Oral, Daily PRN, Judd Gaudier V, MD .  traMADol (ULTRAM) tablet 50 mg, 50 mg, Oral, Q6H PRN, Shahmehdi, Seyed A, MD, 50 mg at 06/24/20 1459 .  traZODone (DESYREL) tablet 50 mg, 50 mg, Oral, QHS PRN, Shahmehdi, Seyed A, MD, 50 mg at 06/24/20 2115    ALLERGIES   Simvastatin     REVIEW OF SYSTEMS    Review of Systems:  Gen:  Denies  fever, sweats, chills weigh loss  HEENT: Denies blurred  vision, double vision, ear pain, eye pain, hearing loss, nose bleeds, sore throat Cardiac:  No dizziness, chest pain or heaviness, chest tightness,edema Resp:   Denies cough or sputum porduction, shortness of breath,wheezing, hemoptysis,  Gi: Denies swallowing difficulty, stomach pain, nausea or vomiting, diarrhea, constipation, bowel incontinence Gu:  Denies bladder incontinence, burning urine Ext:   Denies Joint pain, stiffness or swelling Skin: Denies  skin rash, easy bruising or bleeding or hives Endoc:  Denies polyuria, polydipsia , polyphagia or weight change Psych:   Denies depression, insomnia or hallucinations   Other:  All other systems negative   VS: BP (!) 157/131 (BP Location: Left Arm)   Pulse (!) 108   Temp 97.7 F (36.5 C) (Oral)  Resp 20   Ht 5\' 6"  (1.676 m)   Wt 53.5 kg   SpO2 99%   BMI 19.05 kg/m      PHYSICAL EXAM    GENERAL:NAD, no fevers, chills, no weakness no fatigue HEAD: Normocephalic, atraumatic.  EYES: Pupils equal, round, reactive to light. Extraocular muscles intact. No scleral icterus.  MOUTH: Moist mucosal membrane. Dentition intact. No abscess noted.  EAR, NOSE, THROAT: Clear without exudates. No external lesions.  NECK: Supple. No thyromegaly. No nodules. No JVD.  PULMONARY: mild ronchi bilaterally  CARDIOVASCULAR: S1 and S2. Regular rate and rhythm. No murmurs, rubs, or gallops. No edema. Pedal pulses 2+ bilaterally.  GASTROINTESTINAL: Soft, nontender, nondistended. No masses. Positive bowel sounds. No hepatosplenomegaly.  MUSCULOSKELETAL: No swelling, clubbing, or edema. Range of motion full in all extremities.  NEUROLOGIC: Cranial nerves II through XII are intact. No gross focal neurological deficits. Sensation intact. Reflexes intact.  SKIN: No ulceration, lesions, rashes, or cyanosis. Skin warm and dry. Turgor intact.  PSYCHIATRIC: Mood, affect within normal limits. The patient is awake, alert and oriented x 3. Insight, judgment intact.        IMAGING    DG Chest 1 View  Result Date: 06/25/2020 CLINICAL DATA:  Shortness of breath EXAM: CHEST  1 VIEW COMPARISON:  June 24, 2020 FINDINGS: The heart size and mediastinal contours are unchanged with mild cardiomegaly. Aortic knob calcifications are seen. Again noted is hyperinflation of the lung zones with flattening of the hemidiaphragms. There is mildly increased interstitial opacities seen at both lower lungs. There is new hazy airspace opacity seen at the left lung base. No acute osseous abnormality. IMPRESSION: Increased hazy airspace opacity at the left lung base which could be due to atelectasis and/or infectious etiology. Probable chronic interstitial opacities at both lungs. Electronically Signed   By: Prudencio Pair M.D.   On: 06/25/2020 03:47   DG Chest 2 View  Result Date: 06/02/2020 CLINICAL DATA:  Suspect sepsis. EXAM: CHEST - 2 VIEW COMPARISON:  03/25/2019 FINDINGS: Heart size and vascularity normal. Mild bibasilar atelectasis. Small right effusion. Elevated left hemidiaphragm with bowel gas below the left diaphragm. Mild hyperinflation lungs. No mass lesion. No acute skeletal abnormality. IMPRESSION: Mild bibasilar atelectasis and small right effusion.   COPD. Electronically Signed   By: Franchot Gallo M.D.   On: 06/06/2020 13:35   CT Head Wo Contrast  Result Date: 06/16/2020 CLINICAL DATA:  Altered mental status. EXAM: CT HEAD WITHOUT CONTRAST TECHNIQUE: Contiguous axial images were obtained from the base of the skull through the vertex without intravenous contrast. COMPARISON:  March 16, 2018 FINDINGS: Brain: There is mild cerebral atrophy with widening of the extra-axial spaces and ventricular dilatation. There are areas of decreased attenuation within the white matter tracts of the supratentorial brain, consistent with microvascular disease changes. Vascular: No hyperdense vessel or unexpected calcification. Skull: Normal. Negative for fracture or focal lesion.  Sinuses/Orbits: Very mild, bilateral ethmoid sinus mucosal thickening is seen. Other: None. IMPRESSION: 1. Generalized cerebral atrophy. 2. Very mild, bilateral ethmoid sinus disease. 3. No acute intracranial abnormality. Electronically Signed   By: Virgina Norfolk M.D.   On: 06/04/2020 21:11   MR PELVIS WO CONTRAST  Result Date: 06/24/2020 CLINICAL DATA:  Possible rectal mass on CT EXAM: MRI PELVIS WITHOUT CONTRAST TECHNIQUE: Multiplanar multisequence MR imaging of the pelvis was performed. No intravenous contrast was administered. Small amount of [place] gel was administered per rectum to optimize tumor evaluation. COMPARISON:  CT abdomen/pelvis dated 06/18/2020 FINDINGS: Urinary Tract:  Bladder is within normal limits. Bowel: Distension of the rectum with ultrasound gel. No rectal mass is seen. Visualized bowel is otherwise unremarkable, including the sigmoid colon. Vascular/Lymphatic: No evidence of aneurysm. No suspicious pelvic lymphadenopathy. Reproductive:  Suspected postsurgical changes related to prior TURP. Other:  No pelvic ascites. Musculoskeletal: Mild degenerative changes of the lower lumbar spine. IMPRESSION: No rectosigmoid mass is evident on MR. Negative pelvic MRI. Electronically Signed   By: Julian Hy M.D.   On: 06/24/2020 12:15   MR ABDOMEN W WO CONTRAST  Result Date: 06/24/2020 CLINICAL DATA:  Inpatient. Frequent UTI. COPD and pulmonary fibrosis with chronic respiratory failure. Confusion. Concern for rectal mass on recent CT. EXAM: MRI ABDOMEN WITHOUT AND WITH CONTRAST TECHNIQUE: Multiplanar multisequence MR imaging of the abdomen was performed both before and after the administration of intravenous contrast. CONTRAST:  7.23mL GADAVIST GADOBUTROL 1 MMOL/ML IV SOLN COMPARISON:  06/13/2020 CT abdomen/pelvis. FINDINGS: Lower chest: Small dependent bilateral pleural effusions, right greater than left. Hepatobiliary: Normal liver size and configuration. No hepatic steatosis. No liver  mass. Cholecystectomy. Bile ducts are within normal post cholecystectomy limits. Common bile duct diameter 5 mm. No choledocholithiasis. No biliary masses, strictures or beading. Probable 12 mm diameter cystic duct remnant in the porta hepatis (series 4/image 14). Pancreas: No pancreatic mass or duct dilation.  No pancreas divisum. Spleen: Normal size. No mass. Adrenals/Urinary Tract: Normal adrenals. No hydronephrosis. Scattered simple subcentimeter renal cortical cysts in both kidneys. No suspicious renal masses. Stomach/Bowel: Normal non-distended stomach. Visualized small and large bowel is normal caliber, with no bowel wall thickening. Vascular/Lymphatic: Atherosclerotic nonaneurysmal abdominal aorta. Patent portal, splenic, hepatic and renal veins. Retroaortic left renal vein. No pathologically enlarged lymph nodes in the abdomen. Other: No abdominal ascites or focal fluid collection. Musculoskeletal: No aggressive appearing focal osseous lesions. Mild L4 vertebral compression fracture, chronic appearing. IMPRESSION: 1. No lymphadenopathy or other findings to suggest metastatic disease in the abdomen. MRI pelvis to be reported separately. 2. Bile ducts are within normal post cholecystectomy limits (CBD diameter 5 mm). No choledocholithiasis. Probable small cystic duct remnant in the porta hepatis. 3. Small dependent bilateral pleural effusions, right greater than left. 4. Mild L4 vertebral compression fracture, chronic appearing. Electronically Signed   By: Ilona Sorrel M.D.   On: 06/24/2020 07:01   CT ABDOMEN PELVIS W CONTRAST  Result Date: 05/31/2020 CLINICAL DATA:  Abdominal pain. EXAM: CT ABDOMEN AND PELVIS WITH CONTRAST TECHNIQUE: Multidetector CT imaging of the abdomen and pelvis was performed using the standard protocol following bolus administration of intravenous contrast. CONTRAST:  172mL OMNIPAQUE IOHEXOL 300 MG/ML  SOLN COMPARISON:  None. FINDINGS: Lower chest: A trace amount of atelectasis is  seen within the right lung base. A very small right pleural effusion is noted. Hepatobiliary: No focal liver abnormality is seen. Status post cholecystectomy. No biliary dilatation. Pancreas: Unremarkable. No pancreatic ductal dilatation or surrounding inflammatory changes. Spleen: Normal in size without focal abnormality. Adrenals/Urinary Tract: Adrenal glands are unremarkable. Kidneys are normal, without renal calculi, focal lesion, or hydronephrosis. Bladder is unremarkable. Stomach/Bowel: Stomach is within normal limits. Appendix appears normal. No evidence of bowel dilatation. There is marked severity thickening of the rectum and adjacent portion of the distal sigmoid colon (axial CT images 71 through 75, CT series number 2). This is slightly asymmetric in appearance, right greater than left. No associated inflammatory fat stranding is seen. Vascular/Lymphatic: There is marked severity calcification and atherosclerosis of the abdominal aorta and bilateral common iliac arteries, without evidence of aneurysmal  dilatation. No enlarged abdominal or pelvic lymph nodes. Reproductive: The prostate gland is mildly enlarged. Other: No abdominal wall hernia or abnormality. No abdominopelvic ascites. Musculoskeletal: Moderate to marked severity multilevel degenerative changes seen throughout the lumbar spine. IMPRESSION: 1. Asymmetric of the rectum and adjacent portion of the sigmoid colon, which may be secondary to an underlying mass. Correlation with physical examination and MRI is recommended. 2. Evidence of prior cholecystectomy. 3. Very small right pleural effusion. 4. Mildly enlarged prostate gland. 5. Moderate to marked severity multilevel degenerative changes throughout the lumbar spine. 6. Aortic atherosclerosis. Aortic Atherosclerosis (ICD10-I70.0). Electronically Signed   By: Virgina Norfolk M.D.   On: 06/16/2020 21:22   DG Chest Port 1 View  Result Date: 06/24/2020 CLINICAL DATA:  Shortness of breath,  pulmonary fibrosis, altered mental status EXAM: PORTABLE CHEST 1 VIEW COMPARISON:  05/24/2020 FINDINGS: Mild cardiomegaly. Emphysema. Mild, diffuse interstitial opacity. Bibasilar fibrotic scarring. The visualized skeletal structures are unremarkable. IMPRESSION: Emphysema and bibasilar fibrotic change with mild, diffuse interstitial opacity, potentially edema. No focal airspace opacity. Electronically Signed   By: Eddie Candle M.D.   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    Patient/Family are satisfied with care plan and all questions have been answered.  This document was prepared using Dragon voice recognition software and may include unintentional dictation errors.     Ottie Glazier, M.D.  Division of [facility]

## 2020-06-25 NOTE — Consult Note (Signed)
PHARMACY CONSULT NOTE - FOLLOW UP  Pharmacy Consult for Electrolyte Monitoring and Replacement   Recent Labs: Potassium (mmol/L)  Date Value  06/25/2020 3.8   Magnesium (mg/dL)  Date Value  06/25/2020 2.6 (H)   Calcium (mg/dL)  Date Value  06/25/2020 8.6 (L)   Albumin (g/dL)  Date Value  06/08/2020 3.1 (L)   Sodium (mmol/L)  Date Value  06/25/2020 139   Assessment: 85 yo male with medical history including paroxysmal Afib, HTN, CAD, COPD and pulmonary fibrosis on 3L O2 at baseline with acute hypoxemic respiratory failure and E Coli UTI on ceftriaxone.  Pharmacy has been consulted to monitor and replenish electrolytes as indicated.  Diuresis with IV Lasix 20 mg BID Scheduled replacement with PO KCl 20 mEq daily  Goal of Therapy:  K ~ 4 Mg ~ 2 All other electrolytes WNL  Plan:  --No electrolyte replacement indicated at this time --Will check all electrolytes tomorrow AM  Benita Gutter 06/25/2020 2:30 PM

## 2020-06-25 NOTE — Care Management Important Message (Signed)
Important Message  Patient Details  Name: Eric Li MRN: [id number] Date of Birth: September 23, 1934   Medicare Important Message Given:  Yes     [name] 06/25/2020, 12:57 PM

## 2020-06-25 NOTE — Progress Notes (Signed)
Pt on Non rebreather SAT's at 99-100 approx. 00:00. Pt later requesting food, ice cream given by family, Pt moved up in bed to eat and pads changed. Pt SAT's dropped into the 80's. Pt calm and breathing non labored. RT called to assess and possibly give Breathing TX as Pt needed a venti mask which was not available on floor. Breathing TX admin but SAT's still in the low 89's. NP called to room. RT placed PT on High Flow [place], NP ordered chest xray, Lasix and Morphine which was completed. Pt resting at this time SAT's at 86-87.

## 2020-06-25 NOTE — Progress Notes (Signed)
Marland Kitchen  PROGRESS NOTE    Patient: Eric Li                            PCP: [name], MD                    DOB: 07-14-1935            DOA: 06/13/2020 GUY:[id number]             DOS: 06/25/2020, 11:06 AM   LOS: 4 days   Date of Service: The patient was seen and examined on 06/25/2020  Subjective:   The patient was seen and examined this morning, easily arousable With significant shortness of breath, hypoxia overnight, on high flow oxygen this morning   Apparently his sats dropped to 86-87 overnight RT was notified patient was placed on high flow oxygen via nasal cannula, O2 sat improved.  Patient also received extra dose of Lasix and morphine.   Was discussed with patient's wife family and the patient confirmed DNR/DNI status Daughter present at bedside, open to palliative care discussion   Brief Narrative:   Eric Li is a 85 year old male with history of paroxysmal A. fib on apixaban, HTN, CAD, frequent UTIs, COPD and pulmonary fibrosis with chronic respiratory failure on home O2 at 3 L presenting with confusion and " talking out of his head".  Admitted for altered mental status likely due to UTI.    ED Course:  Vitals were within normal limits  Blood work was significant for WBC of 11,700 and lactic acid of 2>>1.6. Urinalysis showed pyuria.  EKG as reviewed by me : NSR with RBBB with nonspecific ST-T wave changes Chest x-ray showed no acute findings. Head CT no acute findings.  Due to patient complaint of abdominal pain CT abdomen and pelvis with contrast was done which showed asymmetry of the rectum and adjacent portion of the sigmoid colon.  Patient was started on Rocephin    Assessment & Plan:   Principal Problem:   Acute metabolic encephalopathy Active Problems:   CAD (coronary artery disease)   HTN (hypertension)   UTI (urinary tract infection)   PAF (paroxysmal atrial fibrillation) (HCC)   Pulmonary fibrosis (HCC)   Chronic respiratory failure  with hypoxia (HCC)   COPD, severe (HCC)   Abnormal computed tomography angiography (CTA) of abdomen and pelvis   Acute on chronic respiratory failure  Pulmonary fibrosis (HCC)/ Chronic respiratory failure with hypoxia (HCC)/ COPD, severe (HCC)  -Desatted overnight, had to be placed on high flow oxygen by nasal cannula Was satting 8687, currently satting 97% 100% high flow oxygen via nasal cannula  -Received extra dose of Lasix IV overnight 40, morphine - chest x-ray 06/25/2020 reviewed reporting increased opacity on the left base likely due to atelectasis versus infection  -Discussed with family, wife and the patient confirmed DNR/DNI status -Baseline O2 demand 3 L continuously at home -Continue DuoNeb treatment -Increased IV steroids -Scheduled Lasix 20 twice daily IV -Appreciate pulmonary critical team following-recommendations  Acute metabolic encephalopathy.  -Multifactorial likely UTI -E. coli, recurrent UTI, respiratory failure hypoxia -On admission met SIRS criteria -Patient once again meets sepsis criteria due to acute respiratory failure -But remains hemodynamically stable, on high flow oxygen  -Improved mental status   CT head with no acute intracranial findings, and patient has no focal deficits -Patient did not meet sepsis criteria at this time. No fever or tachycardia though with elevated WBC  and lactic acid of 2, but patient did complete a course of Cipro a couple weeks prior -Lactic acid 2.0 >> 1.6 -Continue IV Rocephin.. (As patient's chest x-ray may be consistent with pneumonia, COPD exacerbation) -Discontinuing IV fluid -Follow cultures >> urine culture greater than 100 K colonies of E. coli pansensitive with exception of clinical -Neurologic checks, fall and aspiration precautions     CAD (coronary artery disease) -Denies any chest pain -No EKG changes -Continue metoprolol, nitroglycerin, not currently on Zetia    HTN (hypertension) -Continue diltiazem,   -Stable    PAF (paroxysmal atrial fibrillation) (HCC) -Continue apixaban, diltiazem -Resuming metoprolol    Abnormal computed tomography angiography (CTA) of abdomen and pelvis -Followed by MRI, no concerns none -MRI of abdomen pelvis completed on 06/23/2020 -no comments on sigmoid mass  -GI consult has been canceled  DVT prophylaxis: Apixaban Code Status: full code  Family Communication:   called patient's wife Careers information officer at home number and mobile phone [phone] with the patient's daughter at bedside Discussed extensively with patient's wife yesterday Patient's family open to palliative care discussion  Disposition Plan: Back to previous home environment Consults called: none  Status:At the time of admission, it appears that the appropriate admission status for this patient is INPATIENT     Nutritional status:         Cultures; Blood Cultures x 2 >> NGT Sputum Culture >> > 100 K colonies of E. coli, pansensitive with exception of quinolones  Antimicrobials: 06/22/2020 IV Rocephin >>      Admission status:    Status is: Inpatient  Remains inpatient appropriate because:Inpatient level of care appropriate due to severity of illness   Dispo: The patient is from: ALF              Anticipated d/c is to: ALF              Anticipated d/c date is: 2 days              Patient currently is not medically stable to d/c.        Procedures:   No admission procedures for hospital encounter.     Antimicrobials:  Anti-infectives (From admission, onward)   Start     Dose/Rate Route Frequency Ordered Stop   06/22/20 2200  cefTRIAXone (ROCEPHIN) 1 g in sodium chloride 0.9 % 100 mL IVPB        1 g 200 mL/hr over 30 Minutes Intravenous Every 24 hours 06/17/2020 2248 06/27/20 2159   06/07/2020 2145  cefTRIAXone (ROCEPHIN) 2 g in sodium chloride 0.9 % 100 mL IVPB        2 g 200 mL/hr over 30 Minutes Intravenous  Once 06/16/2020 2142 05/28/2020 2249        Medication:  . acidophilus  1 capsule Oral Daily  . apixaban  2.5 mg Oral BID  . diltiazem  360 mg Oral Daily  . escitalopram  10 mg Oral Daily  . fluticasone  2 spray Each Nare Daily  . furosemide  20 mg Intravenous Q12H  . methylPREDNISolone (SOLU-MEDROL) injection  40 mg Intravenous Q24H  . metoprolol succinate  25 mg Oral Daily  . mometasone-formoterol  2 puff Inhalation BID  . multivitamin with minerals  1 tablet Oral Daily  . pantoprazole  40 mg Oral Daily  . potassium chloride  20 mEq Oral Daily    acetaminophen **OR** acetaminophen, diclofenac Sodium, levalbuterol, nitroGLYCERIN, ondansetron **OR** ondansetron (ZOFRAN) IV, psyllium, traMADol, traZODone   Objective:  Vitals:   06/25/20 0209 06/25/20 0225 06/25/20 0532 06/25/20 0815  BP: 98/62  104/85   Pulse: (!) 103  81   Resp: (!) 24  18   Temp: (!) 97.5 F (36.4 C)  97.9 F (36.6 C)   TempSrc: Oral  Oral   SpO2: (!) 82% (!) 84% (!) 89% 93%  Weight:      Height:        Intake/Output Summary (Last 24 hours) at 06/25/2020 1106 Last data filed at 06/25/2020 0531 Gross per 24 hour  Intake 200 ml  Output 1900 ml  Net -1700 ml   Filed Weights   06/05/2020 1237  Weight: 53.5 kg     Examination:         Physical Exam:   General:  Alert, oriented, cooperative, no distress; on high flow oxygen  HEENT:  Normocephalic, PERRL, otherwise with in Normal limits   Neuro:  CNII-XII intact. , normal motor and sensation, reflexes intact   Lungs:    Labored breathing but comfortable on high flow oxygen positive air sounds throughout the lung fields, reduced at lower lobes never any crackles, positive for rhonchi, mild wheezing   Cardio:    S1/S2, RRR, No murmure, No Rubs or Gallops   Abdomen:   Soft, non-tender, bowel sounds active all four quadrants,  no guarding or peritoneal signs.  Muscular skeletal:  Limited exam - in bed, able to move all 4 extremities, Normal strength,  2+ pulses,  symmetric, No pitting  edema  Skin:  Dry, warm to touch, negative for any Rashes, No open wounds  Wounds: Please see nursing documentation         ------------------------------------------------------------------------------------------------------------------------------------------    LABs:  CBC Latest Ref Rng & Units 06/25/2020 06/22/2020 06/16/2020  WBC 4.0 - 10.5 K/uL 14.5(H) 14.1(H) 11.7(H)  Hemoglobin 13.0 - 17.0 g/dL 15.1 14.1 14.3  Hematocrit 39 - 52 % 44.4 43.7 42.0  Platelets 150 - 400 K/uL 210 216 201   CMP Latest Ref Rng & Units 06/25/2020 06/24/2020 06/24/2020  Glucose 70 - 99 mg/dL 231(H) - 146(H)  BUN 8 - 23 mg/dL 18 - 19  Creatinine 0.61 - 1.24 mg/dL 0.67 - 0.73  Sodium 135 - 145 mmol/L 139 - 138  Potassium 3.5 - 5.1 mmol/L 3.8 3.1(L) 2.9(L)  Chloride 98 - 111 mmol/L 104 - 106  CO2 22 - 32 mmol/L 24 - 22  Calcium 8.9 - 10.3 mg/dL 8.6(L) - 8.1(L)  Total Protein 6.5 - 8.1 g/dL - - -  Total Bilirubin 0.3 - 1.2 mg/dL - - -  Alkaline Phos 38 - 126 U/L - - -  AST 15 - 41 U/L - - -  ALT 0 - 44 U/L - - -       Micro Results Recent Results (from the past 240 hour(s))  Culture, blood (Routine x 2)     Status: None (Preliminary result)   Collection Time: 06/16/2020  1:07 PM   Specimen: BLOOD LEFT ARM  Result Value Ref Range Status   Specimen Description BLOOD LEFT ARM  Final   Special Requests   Final    BOTTLES DRAWN AEROBIC AND ANAEROBIC Blood Culture results may not be optimal due to an excessive volume of blood received in culture bottles   Culture   Final    NO GROWTH 3 DAYS Performed at [facility], [address], [place], [place] [address]    Report Status PENDING  Incomplete  Culture, blood (Routine x 2)  Status: None (Preliminary result)   Collection Time: 06/16/2020  8:37 PM   Specimen: BLOOD  Result Value Ref Range Status   Specimen Description BLOOD RIGHT ANTECUBITAL  Final   Special Requests   Final    BOTTLES DRAWN AEROBIC AND ANAEROBIC Blood Culture  adequate volume   Culture   Final    NO GROWTH 3 DAYS Performed at [facility], [address], [place], [place] [address]    Report Status PENDING  Incomplete  Urine Culture     Status: Abnormal   Collection Time: 06/04/2020  8:37 PM   Specimen: Urine, Random  Result Value Ref Range Status   Specimen Description   Final    URINE, RANDOM Performed at [facility], [address] [place], [place] [address]    Special Requests   Final    NONE Performed at [facility], [address] [place], [place] [address]    Culture >=100,000 COLONIES/mL ESCHERICHIA COLI (A)  Final   Report Status 06/23/2020 FINAL  Final   Organism ID, Bacteria ESCHERICHIA COLI (A)  Final      Susceptibility   Escherichia coli - MIC*    AMPICILLIN <=2 SENSITIVE Sensitive     CEFAZOLIN <=4 SENSITIVE Sensitive     CEFTRIAXONE <=0.25 SENSITIVE Sensitive     CIPROFLOXACIN >=4 RESISTANT Resistant     GENTAMICIN <=1 SENSITIVE Sensitive     IMIPENEM <=0.25 SENSITIVE Sensitive     NITROFURANTOIN <=16 SENSITIVE Sensitive     TRIMETH/SULFA <=20 SENSITIVE Sensitive     AMPICILLIN/SULBACTAM <=2 SENSITIVE Sensitive     PIP/TAZO <=4 SENSITIVE Sensitive     * >=100,000 COLONIES/mL ESCHERICHIA COLI  Respiratory Panel by RT PCR (Flu A&B, Covid) - Nasopharyngeal Swab     Status: None   Collection Time: 05/23/2020  8:46 PM   Specimen: Nasopharyngeal Swab  Result Value Ref Range Status   SARS Coronavirus 2 by RT PCR NEGATIVE NEGATIVE Final    Comment: (NOTE) SARS-CoV-2 target nucleic acids are NOT DETECTED.  The SARS-CoV-2 RNA is generally detectable in upper respiratoy specimens during the acute phase of infection. The lowest concentration of SARS-CoV-2 viral copies this assay can detect is 131 copies/mL. A negative result does not preclude SARS-Cov-2 infe     Influenza A by PCR NEGATIVE NEGATIVE Final   Influenza B by PCR NEGATIVE NEGATIVE Final    Comment: (NOTE) The Xpert Xpress  SARS-CoV-2/FLU/RSV assay is intended as an aid in  the diagnosis of influenza from Nasopharyngeal swab specimens and  should not be used as a sole basis for treatment. Nasal washi    Radiology Reports DG Chest 1 View  Result Date: 06/25/2020 CLINICAL DATA:  Shortness of breath EXAM: CHEST  1 VIEW COMPARISON:  June 24, 2020 FINDINGS: The heart size and mediastinal contours are unchanged with mild cardiomegaly. Aortic knob calcifications are seen. Again noted is hyperinflation of the lung zones with flattening of the hemidiaphragms. There is mildly increased interstitial opacities seen at both lower lungs. There is new hazy airspace opacity seen at the left lung base. No acute osseous abnormality. IMPRESSION: Increased hazy airspace opacity at the left lung base which could be due to atelectasis and/or infectious etiology. Probable chronic interstitial opacities at both lungs. Electronically Signed   By: Prudencio Pair M.D.   On: 06/25/2020 03:47   DG Chest 2 View  Result Date: 05/31/2020 CLINICAL DATA:  Suspect sepsis. EXAM: CHEST - 2 VIEW COMPARISON:  03/25/2019 FINDINGS: Heart size  and vascularity normal. Mild bibasilar atelectasis. Small right effusion. Elevated left hemidiaphragm with bowel gas below the left diaphragm. Mild hyperinflation lungs. No mass lesion. No acute skeletal abnormality. IMPRESSION: Mild bibasilar atelectasis and small right effusion.   COPD. Electronically Signed   By: Franchot Gallo M.D.   On: 05/28/2020 13:35   CT Head Wo Contrast  Result Date: 05/31/2020 CLINICAL DATA:  Altered mental status. EXAM: CT HEAD WITHOUT CONTRAST TECHNIQUE: Contiguous axial images were obtained from the base of the skull through the vertex without intravenous contrast. COMPARISON:  March 16, 2018 FINDINGS: Brain: There is mild cerebral atrophy with widening of the extra-axial spaces and ventricular dilatation. There are areas of decreased attenuation within the white matter tracts of the  supratentorial brain, consistent with microvascular disease changes. Vascular: No hyperdense vessel or unexpected calcification. Skull: Normal. Negative for fracture or focal lesion. Sinuses/Orbits: Very mild, bilateral ethmoid sinus mucosal thickening is seen. Other: None. IMPRESSION: 1. Generalized cerebral atrophy. 2. Very mild, bilateral ethmoid sinus disease. 3. No acute intracranial abnormality. Electronically Signed   By: Virgina Norfolk M.D.   On: 06/19/2020 21:11   MR PELVIS WO CONTRAST  Result Date: 06/24/2020 CLINICAL DATA:  Possible rectal mass on CT EXAM: MRI PELVIS WITHOUT CONTRAST TECHNIQUE: Multiplanar multisequence MR imaging of the pelvis was performed. No intravenous contrast was administered. Small amount of [place] gel was administered per rectum to optimize tumor evaluation. COMPARISON:  CT abdomen/pelvis dated 06/04/2020 FINDINGS: Urinary Tract:  Bladder is within normal limits. Bowel: Distension of the rectum with ultrasound gel. No rectal mass is seen. Visualized bowel is otherwise unremarkable, including the sigmoid colon. Vascular/Lymphatic: No evidence of aneurysm. No suspicious pelvic lymphadenopathy. Reproductive:  Suspected postsurgical changes related to prior TURP. Other:  No pelvic ascites. Musculoskeletal: Mild degenerative changes of the lower lumbar spine. IMPRESSION: No rectosigmoid mass is evident on MR. Negative pelvic MRI. Electronically Signed   By: Julian Hy M.D.   On: 06/24/2020 12:15   MR ABDOMEN W WO CONTRAST  Result Date: 06/24/2020 CLINICAL DATA:  Inpatient. Frequent UTI. COPD and pulmonary fibrosis with chronic respiratory failure. Confusion. Concern for rectal mass on recent CT. EXAM: MRI ABDOMEN WITHOUT AND WITH CONTRAST TECHNIQUE: Multiplanar multisequence MR imaging of the abdomen was performed both before and after the administration of intravenous contrast. CONTRAST:  7.58m GADAVIST GADOBUTROL 1 MMOL/ML IV SOLN COMPARISON:  05/23/2020 CT  abdomen/pelvis. FINDINGS: Lower chest: Small dependent bilateral pleural effusions, right greater than left. Hepatobiliary: Normal liver size and configuration. No hepatic steatosis. No liver mass. Cholecystectomy. Bile ducts are within normal post cholecystectomy limits. Common bile duct diameter 5 mm. No choledocholithiasis. No biliary masses, strictures or beading. Probable 12 mm diameter cystic duct remnant in the porta hepatis (series 4/image 14). Pancreas: No pancreatic mass or duct dilation.  No pancreas divisum. Spleen: Normal size. No mass. Adrenals/Urinary Tract: Normal adrenals. No hydronephrosis. Scattered simple subcentimeter renal cortical cysts in both kidneys. No suspicious renal masses. Stomach/Bowel: Normal non-distended stomach. Visualized small and large bowel is normal caliber, with no bowel wall thickening. Vascular/Lymphatic: Atherosclerotic nonaneurysmal abdominal aorta. Patent portal, splenic, hepatic and renal veins. Retroaortic left renal vein. No pathologically enlarged lymph nodes in the abdomen. Other: No abdominal ascites or focal fluid collection. Musculoskeletal: No aggressive appearing focal osseous lesions. Mild L4 vertebral compression fracture, chronic appearing. IMPRESSION: 1. No lymphadenopathy or other findings to suggest metastatic disease in the abdomen. MRI pelvis to be reported separately. 2. Bile ducts are within normal post cholecystectomy limits (  CBD diameter 5 mm). No choledocholithiasis. Probable small cystic duct remnant in the porta hepatis. 3. Small dependent bilateral pleural effusions, right greater than left. 4. Mild L4 vertebral compression fracture, chronic appearing. Electronically Signed   By: Ilona Sorrel M.D.   On: 06/24/2020 07:01   CT ABDOMEN PELVIS W CONTRAST  Result Date: 06/16/2020 CLINICAL DATA:  Abdominal pain. EXAM: CT ABDOMEN AND PELVIS WITH CONTRAST TECHNIQUE: Multidetector CT imaging of the abdomen and pelvis was performed using the standard  protocol following bolus administration of intravenous contrast. CONTRAST:  157m OMNIPAQUE IOHEXOL 300 MG/ML  SOLN COMPARISON:  None. FINDINGS: Lower chest: A trace amount of atelectasis is seen within the right lung base. A very small right pleural effusion is noted. Hepatobiliary: No focal liver abnormality is seen. Status post cholecystectomy. No biliary dilatation. Pancreas: Unremarkable. No pancreatic ductal dilatation or surrounding inflammatory changes. Spleen: Normal in size without focal abnormality. Adrenals/Urinary Tract: Adrenal glands are unremarkable. Kidneys are normal, without renal calculi, focal lesion, or hydronephrosis. Bladder is unremarkable. Stomach/Bowel: Stomach is within normal limits. Appendix appears normal. No evidence of bowel dilatation. There is marked severity thickening of the rectum and adjacent portion of the distal sigmoid colon (axial CT images 71 through 75, CT series number 2). This is slightly asymmetric in appearance, right greater than left. No associated inflammatory fat stranding is seen. Vascular/Lymphatic: There is marked severity calcification and atherosclerosis of the abdominal aorta and bilateral common iliac arteries, without evidence of aneurysmal dilatation. No enlarged abdominal or pelvic lymph nodes. Reproductive: The prostate gland is mildly enlarged. Other: No abdominal wall hernia or abnormality. No abdominopelvic ascites. Musculoskeletal: Moderate to marked severity multilevel degenerative changes seen throughout the lumbar spine. IMPRESSION: 1. Asymmetric of the rectum and adjacent portion of the sigmoid colon, which may be secondary to an underlying mass. Correlation with physical examination and MRI is recommended. 2. Evidence of prior cholecystectomy. 3. Very small right pleural effusion. 4. Mildly enlarged prostate gland. 5. Moderate to marked severity multilevel degenerative changes throughout the lumbar spine. 6. Aortic atherosclerosis. Aortic  Atherosclerosis (ICD10-I70.0). Electronically Signed   By: TVirgina NorfolkM.D.   On: 06/13/2020 21:22   DG Chest Port 1 View  Result Date: 06/24/2020 CLINICAL DATA:  Shortness of breath, pulmonary fibrosis, altered mental status EXAM: PORTABLE CHEST 1 VIEW COMPARISON:  06/02/2020 FINDINGS: Mild cardiomegaly. Emphysema. Mild, diffuse interstitial opacity. Bibasilar fibrotic scarring. The visualized skeletal structures are unremarkable. IMPRESSION: Emphysema and bibasilar fibrotic change with mild, diffuse interstitial opacity, potentially edema. No focal airspace opacity. Electronically Signed   By: AEddie CandleM.D.   On: 06/24/2020 09:21    SIGNED: SDeatra James MD, FACP, FHM. [facility],  Pager (please use [email or web address] to page/text)  If 7PM-7AM, please contact night-coverage [email or web address], Password [facility]10/12/2019, 11:06 AM

## 2020-06-26 DIAGNOSIS — Z515 Encounter for palliative care: Secondary | ICD-10-CM

## 2020-06-26 DIAGNOSIS — Z66 Do not resuscitate: Secondary | ICD-10-CM

## 2020-06-26 DIAGNOSIS — Z7189 Other specified counseling: Secondary | ICD-10-CM | POA: Diagnosis not present

## 2020-06-26 DIAGNOSIS — J9611 Chronic respiratory failure with hypoxia: Secondary | ICD-10-CM | POA: Diagnosis not present

## 2020-06-26 LAB — BASIC METABOLIC PANEL
Anion gap: 11 (ref 5–15)
BUN: 28 mg/dL — ABNORMAL HIGH (ref 8–23)
CO2: 25 mmol/L (ref 22–32)
Calcium: 8.7 mg/dL — ABNORMAL LOW (ref 8.9–10.3)
Chloride: 99 mmol/L (ref 98–111)
Creatinine, Ser: 0.71 mg/dL (ref 0.61–1.24)
GFR calc Af Amer: >60 mL/min (ref >60)
GFR calc non Af Amer: >60 mL/min (ref >60)
Glucose, Bld: 214 mg/dL — ABNORMAL HIGH (ref 70–99)
Potassium: 3.8 mmol/L (ref 3.5–5.1)
Sodium: 135 mmol/L (ref 135–145)

## 2020-06-26 LAB — CULTURE, BLOOD (ROUTINE X 2)
Culture: NO GROWTH
Culture: NO GROWTH
Special Requests: ADEQUATE

## 2020-06-26 LAB — GLUCOSE, CAPILLARY
Glucose-Capillary: 160 mg/dL — ABNORMAL HIGH (ref 70–99)
Glucose-Capillary: 176 mg/dL — ABNORMAL HIGH (ref 70–99)
Glucose-Capillary: 157 mg/dL — ABNORMAL HIGH (ref 70–99)
Glucose-Capillary: 188 mg/dL — ABNORMAL HIGH (ref 70–99)

## 2020-06-26 LAB — PHOSPHORUS: Phosphorus: 3.4 mg/dL (ref 2.5–4.6)

## 2020-06-26 MED ORDER — METHYLPREDNISOLONE SODIUM SUCC 40 MG IJ SOLR
20.0000 mg | INTRAMUSCULAR | Status: DC
  Administered 2020-06-26 – 2020-06-27 (×2): 20 mg via INTRAVENOUS
  Filled 2020-06-26 (×2): qty 1

## 2020-06-26 MED ORDER — SODIUM CHLORIDE 0.9 % IV SOLN
1.0000 g | INTRAVENOUS | Status: AC
  Administered 2020-06-26: 1 g via INTRAVENOUS
  Filled 2020-06-26: qty 10

## 2020-06-26 NOTE — Progress Notes (Signed)
Daily Progress Note   Patient Name: Eric Li       Date: 06/26/2020 DOB: Aug 01, 1935  Age: 85 y.o. MRN#: [id number] Attending Physician: Deatra James, MD Primary Care Physician: [name], MD Admit Date: 06/09/2020  Reason for Consultation/Follow-up: Establishing goals of care  Subjective: Patient seen twice today - morning and afternoon - alert both times. Tells me he feels good. No complaints. Eating well today. With daughter this AM and wife this afternoon.   Length of Stay: 5  Current Medications: Scheduled Meds:  . acidophilus  1 capsule Oral Daily  . apixaban  2.5 mg Oral BID  . diltiazem  360 mg Oral Daily  . escitalopram  10 mg Oral Daily  . fluticasone  2 spray Each Nare Daily  . furosemide  20 mg Intravenous Q12H  . insulin aspart  0-9 Units Subcutaneous TID WC  . methylPREDNISolone (SOLU-MEDROL) injection  20 mg Intravenous Q24H  . metoprolol succinate  25 mg Oral Daily  . mometasone-formoterol  2 puff Inhalation BID  . multivitamin with minerals  1 tablet Oral Daily  . pantoprazole  40 mg Oral Daily  . potassium chloride  20 mEq Oral Daily    Continuous Infusions: . cefTRIAXone (ROCEPHIN)  IV      PRN Meds: acetaminophen **OR** acetaminophen, diclofenac Sodium, levalbuterol, nitroGLYCERIN, ondansetron **OR** ondansetron (ZOFRAN) IV, psyllium, traMADol, traZODone  Physical Exam Constitutional:      General: He is not in acute distress. Pulmonary:     Comments: Remains on HFNC Skin:    General: Skin is warm and dry.  Neurological:     Mental Status: He is alert. He is disoriented.             Vital Signs: BP 103/81 (BP Location: Right Arm)   Pulse 88   Temp 97.7 F (36.5 C) (Oral)   Resp 16   Ht 5\' 6"  (1.676 m)   Wt 53.5 kg   SpO2 99%   BMI  19.05 kg/m  SpO2: SpO2: 99 % O2 Device: O2 Device: High Flow Nasal Cannula O2 Flow Rate: O2 Flow Rate (L/min): 40 L/min  Intake/output summary:   Intake/Output Summary (Last 24 hours) at 06/26/2020 1427 Last data filed at 06/26/2020 1345 Gross per 24 hour  Intake 600 ml  Output 1350 ml  Net -750 ml   LBM: Last BM Date: 06/23/20 Baseline Weight: Weight: 53.5 kg Most recent weight: Weight:  (patients bed is curently not working unable to [name])       Palliative Assessment/Data: PPS 40%    Flowsheet Rows     Most Recent Value  Intake Tab  Referral Department Hospitalist  Unit at Time of Referral Intermediate Care Unit  Palliative Care Primary Diagnosis Pulmonary  Date Notified 06/25/20  Palliative Care Type New Palliative care  Reason for referral Clarify Goals of Care  Date of Admission 06/05/2020  Date first seen by Palliative Care 06/25/20  # of days Palliative referral response time 0 Day(s)  # of days IP prior to Palliative referral 4  Clinical Assessment  Palliative Performance Scale Score 20%  Psychosocial & Spiritual Assessment  Palliative Care Outcomes  Patient/Family meeting held? Yes  Who  was at the meeting? wife and dtr  Palliative Care Outcomes Clarified goals of care      Patient Active Problem List   Diagnosis Date Noted  . Goals of care, counseling/discussion   . Palliative care by specialist   . DNR (do not resuscitate)   . UTI (urinary tract infection) 06/20/2020  . Acute metabolic encephalopathy 95/18/8416  . Abnormal computed tomography angiography (CTA) of abdomen and pelvis 05/25/2020  . Pulmonary fibrosis ([place]) 02/08/2020  . Chronic respiratory failure with hypoxia ([place]) 02/08/2020  . PAF (paroxysmal atrial fibrillation) ([place]) 10/14/2019  . GAD (generalized anxiety disorder) 10/14/2019  . Chest pain 03/25/2019  . Elevated PSA 03/18/2019  . Atrial flutter ([place]) 11/08/2018  . Memory problem 09/09/2018  . Psychophysiologic insomnia  06/22/2018  . Carotid stenosis 04/02/2018  . PAD (peripheral artery disease) ([place]) 04/02/2018  . Syncope 03/15/2018  . Bilateral hearing loss 03/03/2016  . COPD, moderate ([place]) 08/29/2015  . COPD, severe ([place]) 08/29/2015  . Statin intolerance 03/30/2015  . SOB (shortness of breath) on exertion 03/27/2015  . CAD (coronary artery disease) 02/24/2014  . Hemorrhoids 02/24/2014  . HTN (hypertension) 02/24/2014  . Hyperlipidemia, unspecified 02/24/2014    Palliative Care Assessment & Plan   HPI: 85 y.o. male  with past medical history of a fib, HTN, CAD, COPD, and pulmonary fibrosis on 2-3 L oxygen at home admitted on 06/08/2020 with AMS. Found to have UTI - recurrent - treated with IV antibiotics. Also with increased oxygen requirement - now on HFNC. PMT consulted to discuss [place].  Assessment: Patient remains on HFNC but more alert today, eating. Weaning support down some. Wife and daughter both feel he looks better today and would like to continue current interventions. We did review what full comfort care entails - all questions answered. They understand this may become appropriate but at this point would like to continue aggressive treatment with exception of resuscitation attempts/intubation.   Recommendations/Plan:  Continue current measures  DNR/DNI  PMT will follow  Code Status:  DNR  Prognosis:   Unable to determine  - high risk for acute decline  Discharge Planning:  To Be Determined  Care plan was discussed with wife and daughter  Thank you for allowing the Palliative Medicine Team to assist in the care of this patient.   Total Time 25 minutes Prolonged Time Billed  no       Greater than 50%  of this time was spent counseling and coordinating care related to the above assessment and plan.  Juel Burrow, DNP, [facility] Palliative Medicine Team Team Phone # [phone]  Pager [phone]

## 2020-06-26 NOTE — Progress Notes (Signed)
OT Cancellation Note  Patient Details Name: Eric Li MRN: [id number] DOB: [birth year]   Cancelled Treatment:    Reason Eval/Treat Not Completed: Patient not medically ready. OT order received and chart reviewed. Per Pulmonology consult note, plan for therapy to start tomorrow. Will follow acutely and initiate services as able.    Dessie Coma, M.S. OTR/L  06/26/20, 1:17 PM  ascom [phone]

## 2020-06-26 NOTE — Progress Notes (Signed)
Marland Kitchen  PROGRESS NOTE    Patient: Eric Li                            PCP: [name], MD                    DOB: [birth year]            DOA: 05/27/2020 KCL:[id number]             DOS: 06/26/2020, 11:31 AM   LOS: 5 days   Date of Service: The patient was seen and examined on 06/26/2020  Subjective:   The patient was seen and examined this morning, much more awake alert oriented comfortable on high flow oxygen satting 99%. No significant issues overnight per daughter slept comfortably no episode of descending overnight.   patient confirmed DNR/DNI status Daughter present at bedside, open to palliative care discussion   Brief Narrative:   KURK CORNIEL is a 85 year old male with history of paroxysmal A. fib on apixaban, HTN, CAD, frequent UTIs, COPD and pulmonary fibrosis with chronic respiratory failure on home O2 at 3 L presenting with confusion and " talking out of his head".  Admitted for altered mental status likely due to UTI.    ED Course:  Vitals were within normal limits  Blood work was significant for WBC of 11,700 and lactic acid of 2>>1.6. Urinalysis showed pyuria.  EKG as reviewed by me : NSR with RBBB with nonspecific ST-T wave changes Chest x-ray showed no acute findings. Head CT no acute findings.  Due to patient complaint of abdominal pain CT abdomen and pelvis with contrast was done which showed asymmetry of the rectum and adjacent portion of the sigmoid colon.  Patient was started on Rocephin    Assessment & Plan:   Principal Problem:   Acute metabolic encephalopathy Active Problems:   CAD (coronary artery disease)   HTN (hypertension)   UTI (urinary tract infection)   PAF (paroxysmal atrial fibrillation) (HCC)   Pulmonary fibrosis (HCC)   Chronic respiratory failure with hypoxia (HCC)   COPD, severe (HCC)   Abnormal computed tomography angiography (CTA) of abdomen and pelvis   Goals of care, counseling/discussion   Palliative care by  specialist   DNR (do not resuscitate)   Acute on chronic respiratory failure  Pulmonary fibrosis (HCC)/ Chronic respiratory failure with hypoxia (HCC)/ COPD, severe (HCC)  -Patient hypoxic, acute respiratory failure continue to improve, much more awake alert, satting 99% on high flow oxygen rate has been reduced FiO2 reduced to 70%, anticipating further de-escalation by pulmonary critical team as O2 sat is 99%   - chest x-ray 06/25/2020 reviewed reporting increased opacity on the left base likely due to atelectasis versus infection  -Discussed with family, wife and the patient confirmed DNR/DNI status -Baseline O2 demand 3 L continuously at home -Continue DuoNeb treatment -Increased IV steroids--will be continued -Scheduled Lasix 20 twice daily IV -Appreciate pulmonary critical team following-recommendations  Acute metabolic encephalopathy.  -Multifactorial likely UTI -E. coli, recurrent UTI, respiratory failure hypoxia -On admission met SIRS criteria -Patient once again meets sepsis criteria due to acute respiratory failure -Hemodynamically improving, still on high flow oxygen by nasal cannula  -Much improved mental status today   CT head with no acute intracranial findings, and patient has no focal deficits -Patient did not meet sepsis criteria at this time. No fever or tachycardia though with elevated WBC and lactic acid of 2,  but patient did complete a course of Cipro a couple weeks prior -Lactic acid 2.0 >> 1.6 -Continue IV Rocephin.. (As patient's chest x-ray may be consistent with pneumonia, COPD exacerbation) -Discontinuing IV fluid -Follow cultures >> urine culture greater than 100 K colonies of E. coli pansensitive with exception of clinical -Neurologic checks, fall and aspiration precautions     CAD (coronary artery disease) -Denies chest pain -No EKG changes -Continue metoprolol, nitroglycerin, not currently on Zetia    HTN (hypertension) -Continue diltiazem,   -Remained stable    PAF (paroxysmal atrial fibrillation) (HCC) -Continue apixaban, diltiazem -Resuming metoprolol    Incidental abnormal computed tomography angiography (CTA) of abdomen and pelvis -Followed by MRI, no mass was identified -MRI of abdomen pelvis completed on 06/23/2020 -no comments on sigmoid mass  -GI consult has been canceled  DVT prophylaxis: Apixaban Code Status: full code  Family Communication:   called patient's wife Abe People  [phone] with the -patient daughter present at bedside, findings were discussed in detail  Patient's family open to palliative care discussion  Disposition Plan: Back to previous home environment Consults called: none  Status:At the time of admission, it appears that the appropriate admission status for this patient is INPATIENT     Nutritional status:         Cultures; Blood Cultures x 2 >> NGT Urine culture culture >> > 100 K colonies of E. coli, pansensitive with exception of quinolones  05/28/2020 Respiratory panel influenza A/B- SARS-CoV-2 negative  Antimicrobials: 06/22/2020 IV Rocephin >>      Admission status:    Status is: Inpatient  Remains inpatient appropriate because:Inpatient level of care appropriate due to severity of illness   Dispo: The patient is from: ALF              Anticipated d/c is to: ALF              Anticipated d/c date is: 2 days              Patient currently is not medically stable to d/c.        Procedures:   No admission procedures for hospital encounter.     Antimicrobials:  Anti-infectives (From admission, onward)   Start     Dose/Rate Route Frequency Ordered Stop   06/22/20 2200  cefTRIAXone (ROCEPHIN) 1 g in sodium chloride 0.9 % 100 mL IVPB        1 g 200 mL/hr over 30 Minutes Intravenous Every 24 hours 05/29/2020 2248 06/27/20 2159   05/23/2020 2145  cefTRIAXone (ROCEPHIN) 2 g in sodium chloride 0.9 % 100 mL IVPB        2 g 200 mL/hr over 30  Minutes Intravenous  Once 06/03/2020 2142 06/18/2020 2249       Medication:   acidophilus  1 capsule Oral Daily   apixaban  2.5 mg Oral BID   diltiazem  360 mg Oral Daily   escitalopram  10 mg Oral Daily   fluticasone  2 spray Each Nare Daily   furosemide  20 mg Intravenous Q12H   insulin aspart  0-9 Units Subcutaneous TID WC   methylPREDNISolone (SOLU-MEDROL) injection  20 mg Intravenous Q24H   metoprolol succinate  25 mg Oral Daily   mometasone-formoterol  2 puff Inhalation BID   multivitamin with minerals  1 tablet Oral Daily   pantoprazole  40 mg Oral Daily   potassium chloride  20 mEq Oral Daily    acetaminophen **OR** acetaminophen, diclofenac Sodium, levalbuterol, nitroGLYCERIN,  ondansetron **OR** ondansetron (ZOFRAN) IV, psyllium, traMADol, traZODone   Objective:   Vitals:   06/26/20 0555 06/26/20 0823 06/26/20 0834 06/26/20 1126  BP: 112/66 103/82  103/81  Pulse: (!) 53 86  88  Resp: _0 Temp: (!) 97.5 F (36.4 C) 97.7 F (36.5 C)  97.7 F (36.5 C)  TempSrc: Oral Oral  Oral  SpO2: 96% 96% 96% 99%  Weight:      Height:        Intake/Output Summary (Last 24 hours) at 06/26/2020 1131 Last data filed at 06/26/2020 0950 Gross per 24 hour  Intake 360 ml  Output 800 ml  Net -440 ml   Filed Weights   05/24/2020 1237  Weight: 53.5 kg     Examination:         Physical Exam:   General:  Alert, oriented, cooperative, no distress; complain of shortness of breath, on high flow oxygen  HEENT:  Normocephalic, PERRL, otherwise with in Normal limits   Neuro:  CNII-XII intact. , normal motor and sensation, reflexes intact   Lungs:   Clear to auscultation BL, Respirations unlabored, no wheezes / crackles  Cardio:    S1/S2, RRR, No murmure, No Rubs or Gallops   Abdomen:   Soft, non-tender, bowel sounds active all four quadrants,  no guarding or peritoneal signs.  Muscular skeletal:   Generalized weaknesses Limited exam - in bed, able to move all 4  extremities, Normal strength,  2+ pulses,  symmetric, No pitting edema  Skin:  Dry, warm to touch, negative for any Rashes, No open wounds  Wounds: Please see nursing documentation                 ------------------------------------------------------------------------------------------------------------------------------------------    LABs:  CBC Latest Ref Rng & Units 06/25/2020 06/22/2020 06/06/2020  WBC 4.0 - 10.5 K/uL 14.5(H) 14.1(H) 11.7(H)  Hemoglobin 13.0 - 17.0 g/dL 15.1 14.1 14.3  Hematocrit 39 - 52 % 44.4 43.7 42.0  Platelets 150 - 400 K/uL 210 216 201   CMP Latest Ref Rng & Units 06/26/2020 06/25/2020 06/24/2020  Glucose 70 - 99 mg/dL 214(H) 231(H) -  BUN 8 - 23 mg/dL 28(H) 18 -  Creatinine 0.61 - 1.24 mg/dL 0.71 0.67 -  Sodium 135 - 145 mmol/L 135 139 -  Potassium 3.5 - 5.1 mmol/L 3.8 3.8 3.1(L)  Chloride 98 - 111 mmol/L 99 104 -  CO2 22 - 32 mmol/L 25 24 -  Calcium 8.9 - 10.3 mg/dL 8.7(L) 8.6(L) -  Total Protein 6.5 - 8.1 g/dL - - -  Total Bilirubin 0.3 - 1.2 mg/dL - - -  Alkaline Phos 38 - 126 U/L - - -  AST 15 - 41 U/L - - -  ALT 0 - 44 U/L - - -       Micro Results Recent Results (from the past 240 hour(s))  Culture, blood (Routine x 2)     Status: None (Preliminary result)   Collection Time: 06/20/2020  1:07 PM   Specimen: BLOOD LEFT ARM  Result Value Ref Range Status   Specimen Description BLOOD LEFT ARM  Final   Special Requests   Final    BOTTLES DRAWN AEROBIC AND ANAEROBIC Blood Culture results may not be optimal due to an excessive volume of blood received in culture bottles   Culture   Final    NO GROWTH 3 DAYS Performed at [facility], [address] [place], [place] [address]    Report Status PENDING  Incomplete  Culture, blood (Routine x 2)     Status: None (Preliminary result)   Collection Time: 06/03/2020  8:37 PM   Specimen: BLOOD  Result Value Ref Range Status   Specimen Description BLOOD RIGHT ANTECUBITAL  Final   Special  Requests   Final    BOTTLES DRAWN AEROBIC AND ANAEROBIC Blood Culture adequate volume   Culture   Final    NO GROWTH 3 DAYS Performed at [facility], [address] [place], [place] [address]    Report Status PENDING  Incomplete  Urine Culture     Status: Abnormal   Collection Time: 06/11/2020  8:37 PM   Specimen: Urine, Random  Result Value Ref Range Status   Specimen Description   Final    URINE, RANDOM Performed at [facility], [address], [place], [place] [address]    Special Requests   Final    NONE Performed at [facility], [address] [place], [place] [address]    Culture >=100,000 COLONIES/mL ESCHERICHIA COLI (A)  Final   Report Status 06/23/2020 FINAL  Final   Organism ID, Bacteria ESCHERICHIA COLI (A)  Final      Susceptibility   Escherichia coli - MIC*    AMPICILLIN <=2 SENSITIVE Sensitive     CEFAZOLIN <=4 SENSITIVE Sensitive     CEFTRIAXONE <=0.25 SENSITIVE Sensitive     CIPROFLOXACIN >=4 RESISTANT Resistant     GENTAMICIN <=1 SENSITIVE Sensitive     IMIPENEM <=0.25 SENSITIVE Sensitive     NITROFURANTOIN <=16 SENSITIVE Sensitive     TRIMETH/SULFA <=20 SENSITIVE Sensitive     AMPICILLIN/SULBACTAM <=2 SENSITIVE Sensitive     PIP/TAZO <=4 SENSITIVE Sensitive     * >=100,000 COLONIES/mL ESCHERICHIA COLI  Respiratory Panel by RT PCR (Flu A&B, Covid) - Nasopharyngeal Swab     Status: None   Collection Time: 06/14/2020  8:46 PM   Specimen: Nasopharyngeal Swab  Result Value Ref Range Status   SARS Coronavirus 2 by RT PCR NEGATIVE NEGATIVE Final    Comment: (NOTE) SARS-CoV-2 target nucleic acids are NOT DETECTED.  The SARS-CoV-2 RNA is generally detectable in upper respiratoy specimens during the acute phase of infection. The lowest concentration of SARS-CoV-2 viral copies this assay can detect is 131 copies/mL. A negative result does not preclude SARS-Cov-2 infe     Influenza A by PCR NEGATIVE NEGATIVE Final   Influenza B  by PCR NEGATIVE NEGATIVE Final    Comment: (NOTE) The Xpert Xpress SARS-CoV-2/FLU/RSV assay is intended as an aid in  the diagnosis of influenza from Nasopharyngeal swab specimens and  should not be used as a sole basis for treatment. Nasal washi    Radiology Reports DG Chest 1 View  Result Date: 06/25/2020 CLINICAL DATA:  Shortness of breath EXAM: CHEST  1 VIEW COMPARISON:  June 24, 2020 FINDINGS: The heart size and mediastinal contours are unchanged with mild cardiomegaly. Aortic knob calcifications are seen. Again noted is hyperinflation of the lung zones with flattening of the hemidiaphragms. There is mildly increased interstitial opacities seen at both lower lungs. There is new hazy airspace opacity seen at the left lung base. No acute osseous abnormality. IMPRESSION: Increased hazy airspace opacity at the left lung base which could be due to atelectasis and/or infectious etiology. Probable chronic interstitial opacities at both lungs. Electronically Signed   By: Prudencio Pair M.D.   On: 06/25/2020 03:47   DG Chest 2 View  Result Date: 05/26/2020 CLINICAL DATA:  Suspect sepsis.  EXAM: CHEST - 2 VIEW COMPARISON:  03/25/2019 FINDINGS: Heart size and vascularity normal. Mild bibasilar atelectasis. Small right effusion. Elevated left hemidiaphragm with bowel gas below the left diaphragm. Mild hyperinflation lungs. No mass lesion. No acute skeletal abnormality. IMPRESSION: Mild bibasilar atelectasis and small right effusion.   COPD. Electronically Signed   By: Franchot Gallo M.D.   On: 06/16/2020 13:35   CT Head Wo Contrast  Result Date: 06/10/2020 CLINICAL DATA:  Altered mental status. EXAM: CT HEAD WITHOUT CONTRAST TECHNIQUE: Contiguous axial images were obtained from the base of the skull through the vertex without intravenous contrast. COMPARISON:  March 16, 2018 FINDINGS: Brain: There is mild cerebral atrophy with widening of the extra-axial spaces and ventricular dilatation. There are areas of  decreased attenuation within the white matter tracts of the supratentorial brain, consistent with microvascular disease changes. Vascular: No hyperdense vessel or unexpected calcification. Skull: Normal. Negative for fracture or focal lesion. Sinuses/Orbits: Very mild, bilateral ethmoid sinus mucosal thickening is seen. Other: None. IMPRESSION: 1. Generalized cerebral atrophy. 2. Very mild, bilateral ethmoid sinus disease. 3. No acute intracranial abnormality. Electronically Signed   By: Virgina Norfolk M.D.   On: 06/20/2020 21:11   MR PELVIS WO CONTRAST  Result Date: 06/24/2020 CLINICAL DATA:  Possible rectal mass on CT EXAM: MRI PELVIS WITHOUT CONTRAST TECHNIQUE: Multiplanar multisequence MR imaging of the pelvis was performed. No intravenous contrast was administered. Small amount of [place] gel was administered per rectum to optimize tumor evaluation. COMPARISON:  CT abdomen/pelvis dated 06/14/2020 FINDINGS: Urinary Tract:  Bladder is within normal limits. Bowel: Distension of the rectum with ultrasound gel. No rectal mass is seen. Visualized bowel is otherwise unremarkable, including the sigmoid colon. Vascular/Lymphatic: No evidence of aneurysm. No suspicious pelvic lymphadenopathy. Reproductive:  Suspected postsurgical changes related to prior TURP. Other:  No pelvic ascites. Musculoskeletal: Mild degenerative changes of the lower lumbar spine. IMPRESSION: No rectosigmoid mass is evident on MR. Negative pelvic MRI. Electronically Signed   By: Julian Hy M.D.   On: 06/24/2020 12:15   MR ABDOMEN W WO CONTRAST  Result Date: 06/24/2020 CLINICAL DATA:  Inpatient. Frequent UTI. COPD and pulmonary fibrosis with chronic respiratory failure. Confusion. Concern for rectal mass on recent CT. EXAM: MRI ABDOMEN WITHOUT AND WITH CONTRAST TECHNIQUE: Multiplanar multisequence MR imaging of the abdomen was performed both before and after the administration of intravenous contrast. CONTRAST:  7.34mL GADAVIST  GADOBUTROL 1 MMOL/ML IV SOLN COMPARISON:  05/26/2020 CT abdomen/pelvis. FINDINGS: Lower chest: Small dependent bilateral pleural effusions, right greater than left. Hepatobiliary: Normal liver size and configuration. No hepatic steatosis. No liver mass. Cholecystectomy. Bile ducts are within normal post cholecystectomy limits. Common bile duct diameter 5 mm. No choledocholithiasis. No biliary masses, strictures or beading. Probable 12 mm diameter cystic duct remnant in the porta hepatis (series 4/image 14). Pancreas: No pancreatic mass or duct dilation.  No pancreas divisum. Spleen: Normal size. No mass. Adrenals/Urinary Tract: Normal adrenals. No hydronephrosis. Scattered simple subcentimeter renal cortical cysts in both kidneys. No suspicious renal masses. Stomach/Bowel: Normal non-distended stomach. Visualized small and large bowel is normal caliber, with no bowel wall thickening. Vascular/Lymphatic: Atherosclerotic nonaneurysmal abdominal aorta. Patent portal, splenic, hepatic and renal veins. Retroaortic left renal vein. No pathologically enlarged lymph nodes in the abdomen. Other: No abdominal ascites or focal fluid collection. Musculoskeletal: No aggressive appearing focal osseous lesions. Mild L4 vertebral compression fracture, chronic appearing. IMPRESSION: 1. No lymphadenopathy or other findings to suggest metastatic disease in the abdomen. MRI pelvis to be  reported separately. 2. Bile ducts are within normal post cholecystectomy limits (CBD diameter 5 mm). No choledocholithiasis. Probable small cystic duct remnant in the porta hepatis. 3. Small dependent bilateral pleural effusions, right greater than left. 4. Mild L4 vertebral compression fracture, chronic appearing. Electronically Signed   By: Ilona Sorrel M.D.   On: 06/24/2020 07:01   CT ABDOMEN PELVIS W CONTRAST  Result Date: 06/03/2020 CLINICAL DATA:  Abdominal pain. EXAM: CT ABDOMEN AND PELVIS WITH CONTRAST TECHNIQUE: Multidetector CT imaging of  the abdomen and pelvis was performed using the standard protocol following bolus administration of intravenous contrast. CONTRAST:  189m OMNIPAQUE IOHEXOL 300 MG/ML  SOLN COMPARISON:  None. FINDINGS: Lower chest: A trace amount of atelectasis is seen within the right lung base. A very small right pleural effusion is noted. Hepatobiliary: No focal liver abnormality is seen. Status post cholecystectomy. No biliary dilatation. Pancreas: Unremarkable. No pancreatic ductal dilatation or surrounding inflammatory changes. Spleen: Normal in size without focal abnormality. Adrenals/Urinary Tract: Adrenal glands are unremarkable. Kidneys are normal, without renal calculi, focal lesion, or hydronephrosis. Bladder is unremarkable. Stomach/Bowel: Stomach is within normal limits. Appendix appears normal. No evidence of bowel dilatation. There is marked severity thickening of the rectum and adjacent portion of the distal sigmoid colon (axial CT images 71 through 75, CT series number 2). This is slightly asymmetric in appearance, right greater than left. No associated inflammatory fat stranding is seen. Vascular/Lymphatic: There is marked severity calcification and atherosclerosis of the abdominal aorta and bilateral common iliac arteries, without evidence of aneurysmal dilatation. No enlarged abdominal or pelvic lymph nodes. Reproductive: The prostate gland is mildly enlarged. Other: No abdominal wall hernia or abnormality. No abdominopelvic ascites. Musculoskeletal: Moderate to marked severity multilevel degenerative changes seen throughout the lumbar spine. IMPRESSION: 1. Asymmetric of the rectum and adjacent portion of the sigmoid colon, which may be secondary to an underlying mass. Correlation with physical examination and MRI is recommended. 2. Evidence of prior cholecystectomy. 3. Very small right pleural effusion. 4. Mildly enlarged prostate gland. 5. Moderate to marked severity multilevel degenerative changes throughout  the lumbar spine. 6. Aortic atherosclerosis. Aortic Atherosclerosis (ICD10-I70.0). Electronically Signed   By: TVirgina NorfolkM.D.   On: 06/09/2020 21:22   DG Chest Port 1 View  Result Date: 06/24/2020 CLINICAL DATA:  Shortness of breath, pulmonary fibrosis, altered mental status EXAM: PORTABLE CHEST 1 VIEW COMPARISON:  06/05/2020 FINDINGS: Mild cardiomegaly. Emphysema. Mild, diffuse interstitial opacity. Bibasilar fibrotic scarring. The visualized skeletal structures are unremarkable. IMPRESSION: Emphysema and bibasilar fibrotic change with mild, diffuse interstitial opacity, potentially edema. No focal airspace opacity. Electronically Signed   By: AEddie CandleM.D.   On: 06/24/2020 09:21    SIGNED: SDeatra James MD, FACP, FHM. [facility],  Pager (please use [email or web address] to page/text)  If 7PM-7AM, please contact night-coverage [email or web address] Lake Surgicare Ltd10/01/2020, 11:31 AM

## 2020-06-26 NOTE — Progress Notes (Signed)
PT Cancellation Note  Patient Details Name: Eric Li MRN: [id number] DOB: 11-24-1934   Cancelled Treatment:    Reason Eval/Treat Not Completed: Patient not medically ready. Pulmonology MD states for PT to begin tomorrow. Will hold for today and assess tomorrow.    Cierah Crader 06/26/2020, 1:52 PM

## 2020-06-26 NOTE — Consult Note (Signed)
PHARMACY CONSULT NOTE - FOLLOW UP  Pharmacy Consult for Electrolyte Monitoring and Replacement   Recent Labs: Potassium (mmol/L)  Date Value  06/26/2020 3.8   Magnesium (mg/dL)  Date Value  06/25/2020 2.6 (H)   Calcium (mg/dL)  Date Value  06/26/2020 8.7 (L)   Albumin (g/dL)  Date Value  06/05/2020 3.1 (L)   Phosphorus (mg/dL)  Date Value  06/26/2020 3.4   Sodium (mmol/L)  Date Value  06/26/2020 135   Assessment: 85 yo male with medical history including paroxysmal Afib, HTN, CAD, COPD and pulmonary fibrosis on 3L O2 at baseline with acute hypoxemic respiratory failure and E Coli UTI on ceftriaxone.  Pharmacy has been consulted to monitor and replenish electrolytes as indicated.  Diuresis with IV Lasix 20 mg BID Scheduled replacement with PO KCl 20 mEq daily  Goal of Therapy:  K ~ 4 Mg ~ 2 All other electrolytes WNL  Plan:  --No electrolyte replacement indicated at this time --Will check electrolytes tomorrow AM  Oswald Hillock, PharmD, BCPS 06/26/2020 7:47 AM

## 2020-06-26 NOTE — Progress Notes (Signed)
Pulmonary Medicine          Date: 06/26/2020,   MRN# [id number] Eric Li 05/14/1935     AdmissionWeight: 53.5 kg                 CurrentWeight:  (patients bed is curently not working unable to [name])   Referring physician: Dr Roger Shelter   CHIEF COMPLAINT:   Acute hypoxemic respiratory failure   HISTORY OF PRESENT ILLNESS   As per admission h/p Eric Li is a 85 y.o. male with medical history significant for paroxysmal A. fib on apixaban, HTN, CAD, COPD and pulmonary fibrosis with chronic respiratory failure on home O2 at 3 L, who was brought to the emergency room by EMS because of confusion. Family called with concerns for patient waking up disoriented believing he was somewhere else and appeared to be not acting himself and " talking out of his head". Wife also voiced concern for possible UTI as his urination was frequent and with strong odor. He has had no fever or chills, or chest pain or shortness of breath. Has had no nausea, vomiting or change in bowel habits. He is fully vaccinated against Covid. On arrival, his vitals were within normal limits without fever or tachycardia and with O2 sat 97% on O2 at home flow rate . Blood work was significant for WBC of 11,700 and lactic acid of 2>>1.6. Urinalysis showed pyuria. Other blood work mostly unremarkable.  EKG  NSR with RBBB with nonspecific ST-T wave changes  Chest x-ray showed no acute findings, head CT no acute findings. Due to patient complaint of abdominal pain CT abdomen and pelvis with contrast was done which showed asymmetry of the rectum and adjacent portion of the sigmoid colon. Pulmonary consultation for worsening dyspnea with acute on chronic hypoxemic respiratory failure.   06/25/20- patient evaluated at bedside this afternoon.  He had repeat CXR overnight with worsening LLL infiltrate.  Family member at bedside (daughter) shares that he had desatutation event after eating. This suggests  aspiration. He made adequate urine with lasix with notable improvement after diuresis as per family.  MetNEB was unable to be used due to patient being sleepy unable to participate.    06/26/20-  Patient with mild imrpovement.  Noted Palliative evaluation appreciate input. Will institute OT/PT starting tommorow.  Contiue gentle diuresis and MetaNEB therapy as well as current antimicrobials.     PAST MEDICAL HISTORY   Past Medical History:  Diagnosis Date  . Bilateral hearing loss 03/03/2016  . CAD (coronary artery disease) 02/24/2014  . CHF (congestive heart failure) ([place])   . COPD, moderate ([place]) 08/29/2015  . Hemorrhoids 02/24/2014  . HTN (hypertension) 02/24/2014  . Hyperlipidemia, unspecified 02/24/2014  . Nephrolithiasis   . SOB (shortness of breath) on exertion 03/27/2015  . Statin intolerance 03/30/2015     SURGICAL HISTORY   Past Surgical History:  Procedure Laterality Date  . CHOLECYSTECTOMY    . TONSILLECTOMY       FAMILY HISTORY   Family History  Problem Relation Age of Onset  . Bladder Cancer Neg Hx   . Prolactinoma Neg Hx   . Prostate cancer Neg Hx   . Kidney cancer Neg Hx      SOCIAL HISTORY   Social History   Tobacco Use  . Smoking status: Former Research scientist (life sciences)  . Smokeless tobacco: Never Used  Vaping Use  . Vaping Use: Never used  Substance Use Topics  . Alcohol use: No  .  Drug use: No     MEDICATIONS    Home Medication:    Current Medication:  Current Facility-Administered Medications:  .  acetaminophen (TYLENOL) tablet 650 mg, 650 mg, Oral, Q6H PRN, 650 mg at 06/24/20 1711 **OR** acetaminophen (TYLENOL) suppository 650 mg, 650 mg, Rectal, Q6H PRN, Athena Masse, MD .  acidophilus (RISAQUAD) capsule 1 capsule, 1 capsule, Oral, Daily, Shahmehdi, Seyed A, MD, 1 capsule at 06/26/20 0929 .  apixaban (ELIQUIS) tablet 2.5 mg, 2.5 mg, Oral, BID, Judd Gaudier V, MD, 2.5 mg at 06/26/20 0929 .  cefTRIAXone (ROCEPHIN) 1 g in sodium chloride 0.9 % 100 mL IVPB, 1  g, Intravenous, Q24H, Oswald Hillock, RPH, Last Rate: 200 mL/hr at 06/25/20 2224, 1 g at 06/25/20 2224 .  diclofenac Sodium (VOLTAREN) 1 % topical gel 2 g, 2 g, Topical, BID PRN, Shahmehdi, Seyed A, MD .  diltiazem (CARDIZEM CD) 24 hr capsule 360 mg, 360 mg, Oral, Daily, Judd Gaudier V, MD, 360 mg at 06/26/20 0928 .  escitalopram (LEXAPRO) tablet 10 mg, 10 mg, Oral, Daily, Judd Gaudier V, MD, 10 mg at 06/26/20 0928 .  fluticasone (FLONASE) 50 MCG/ACT nasal spray 2 spray, 2 spray, Each Nare, Daily, Judd Gaudier V, MD, 2 spray at 06/26/20 0929 .  furosemide (LASIX) injection 20 mg, 20 mg, Intravenous, Q12H, Ottie Glazier, MD, 20 mg at 06/26/20 0605 .  insulin aspart (novoLOG) injection 0-9 Units, 0-9 Units, Subcutaneous, TID WC, Shahmehdi, Seyed A, MD, 2 Units at 06/26/20 [phone] .  levalbuterol (XOPENEX) nebulizer solution 0.63 mg, 0.63 mg, Nebulization, Q6H PRN, [place], Bing Neighbors, NP .  methylPREDNISolone sodium succinate (SOLU-MEDROL) 40 mg/mL injection 20 mg, 20 mg, Intravenous, Q24H, Holmes Hays, MD .  metoprolol succinate (TOPROL-XL) 24 hr tablet 25 mg, 25 mg, Oral, Daily, Ottie Glazier, MD, 25 mg at 06/26/20 0928 .  mometasone-formoterol (DULERA) 200-5 MCG/ACT inhaler 2 puff, 2 puff, Inhalation, BID, Athena Masse, MD, 2 puff at 06/26/20 0929 .  multivitamin with minerals tablet 1 tablet, 1 tablet, Oral, Daily, Athena Masse, MD, 1 tablet at 06/26/20 [phone] .  nitroGLYCERIN (NITROSTAT) SL tablet 0.4 mg, 0.4 mg, Sublingual, Q5 min PRN, Judd Gaudier V, MD .  ondansetron (ZOFRAN) tablet 4 mg, 4 mg, Oral, Q6H PRN **OR** ondansetron (ZOFRAN) injection 4 mg, 4 mg, Intravenous, Q6H PRN, Athena Masse, MD .  pantoprazole (PROTONIX) EC tablet 40 mg, 40 mg, Oral, Daily, Judd Gaudier V, MD, 40 mg at 06/26/20 0929 .  potassium chloride 20 MEQ/15ML (10%) solution 20 mEq, 20 mEq, Oral, Daily, Lanney Gins, Jakson Delpilar, MD, 20 mEq at 06/26/20 0928 .  psyllium (HYDROCIL/METAMUCIL) 1 packet, 1 packet,  Oral, Daily PRN, Judd Gaudier V, MD .  traMADol (ULTRAM) tablet 50 mg, 50 mg, Oral, Q6H PRN, Shahmehdi, Seyed A, MD, 50 mg at 06/24/20 1459 .  traZODone (DESYREL) tablet 50 mg, 50 mg, Oral, QHS PRN, Shahmehdi, Seyed A, MD, 50 mg at 06/25/20 2216    ALLERGIES   Simvastatin     REVIEW OF SYSTEMS    Review of Systems:  Gen:  Denies  fever, sweats, chills weigh loss  HEENT: Denies blurred vision, double vision, ear pain, eye pain, hearing loss, nose bleeds, sore throat Cardiac:  No dizziness, chest pain or heaviness, chest tightness,edema Resp:   Denies cough or sputum porduction, shortness of breath,wheezing, hemoptysis,  Gi: Denies swallowing difficulty, stomach pain, nausea or vomiting, diarrhea, constipation, bowel incontinence Gu:  Denies bladder incontinence, burning urine Ext:   Denies Joint pain,  stiffness or swelling Skin: Denies  skin rash, easy bruising or bleeding or hives Endoc:  Denies polyuria, polydipsia , polyphagia or weight change Psych:   Denies depression, insomnia or hallucinations   Other:  All other systems negative   VS: BP 103/82 (BP Location: Left Arm)   Pulse 86   Temp 97.7 F (36.5 C) (Oral)   Resp 18   Ht 5\' 6"  (1.676 m)   Wt 53.5 kg   SpO2 96%   BMI 19.05 kg/m      PHYSICAL EXAM    GENERAL:NAD, no fevers, chills, no weakness no fatigue HEAD: Normocephalic, atraumatic.  EYES: Pupils equal, round, reactive to light. Extraocular muscles intact. No scleral icterus.  MOUTH: Moist mucosal membrane. Dentition intact. No abscess noted.  EAR, NOSE, THROAT: Clear without exudates. No external lesions.  NECK: Supple. No thyromegaly. No nodules. No JVD.  PULMONARY: mild ronchi bilaterally  CARDIOVASCULAR: S1 and S2. Regular rate and rhythm. No murmurs, rubs, or gallops. No edema. Pedal pulses 2+ bilaterally.  GASTROINTESTINAL: Soft, nontender, nondistended. No masses. Positive bowel sounds. No hepatosplenomegaly.  MUSCULOSKELETAL: No swelling,  clubbing, or edema. Range of motion full in all extremities.  NEUROLOGIC: Cranial nerves II through XII are intact. No gross focal neurological deficits. Sensation intact. Reflexes intact.  SKIN: No ulceration, lesions, rashes, or cyanosis. Skin warm and dry. Turgor intact.  PSYCHIATRIC: Mood, affect within normal limits. The patient is awake, alert and oriented x 3. Insight, judgment intact.       IMAGING    DG Chest 1 View  Result Date: 06/25/2020 CLINICAL DATA:  Shortness of breath EXAM: CHEST  1 VIEW COMPARISON:  June 24, 2020 FINDINGS: The heart size and mediastinal contours are unchanged with mild cardiomegaly. Aortic knob calcifications are seen. Again noted is hyperinflation of the lung zones with flattening of the hemidiaphragms. There is mildly increased interstitial opacities seen at both lower lungs. There is new hazy airspace opacity seen at the left lung base. No acute osseous abnormality. IMPRESSION: Increased hazy airspace opacity at the left lung base which could be due to atelectasis and/or infectious etiology. Probable chronic interstitial opacities at both lungs. Electronically Signed   By: Prudencio Pair M.D.   On: 06/25/2020 03:47   DG Chest 2 View  Result Date: 06/01/2020 CLINICAL DATA:  Suspect sepsis. EXAM: CHEST - 2 VIEW COMPARISON:  03/25/2019 FINDINGS: Heart size and vascularity normal. Mild bibasilar atelectasis. Small right effusion. Elevated left hemidiaphragm with bowel gas below the left diaphragm. Mild hyperinflation lungs. No mass lesion. No acute skeletal abnormality. IMPRESSION: Mild bibasilar atelectasis and small right effusion.   COPD. Electronically Signed   By: Franchot Gallo M.D.   On: 06/03/2020 13:35   CT Head Wo Contrast  Result Date: 06/15/2020 CLINICAL DATA:  Altered mental status. EXAM: CT HEAD WITHOUT CONTRAST TECHNIQUE: Contiguous axial images were obtained from the base of the skull through the vertex without intravenous contrast. COMPARISON:   March 16, 2018 FINDINGS: Brain: There is mild cerebral atrophy with widening of the extra-axial spaces and ventricular dilatation. There are areas of decreased attenuation within the white matter tracts of the supratentorial brain, consistent with microvascular disease changes. Vascular: No hyperdense vessel or unexpected calcification. Skull: Normal. Negative for fracture or focal lesion. Sinuses/Orbits: Very mild, bilateral ethmoid sinus mucosal thickening is seen. Other: None. IMPRESSION: 1. Generalized cerebral atrophy. 2. Very mild, bilateral ethmoid sinus disease. 3. No acute intracranial abnormality. Electronically Signed   By: Joyce Gross.D.  On: 06/07/2020 21:11   MR PELVIS WO CONTRAST  Result Date: 06/24/2020 CLINICAL DATA:  Possible rectal mass on CT EXAM: MRI PELVIS WITHOUT CONTRAST TECHNIQUE: Multiplanar multisequence MR imaging of the pelvis was performed. No intravenous contrast was administered. Small amount of [place] gel was administered per rectum to optimize tumor evaluation. COMPARISON:  CT abdomen/pelvis dated 05/23/2020 FINDINGS: Urinary Tract:  Bladder is within normal limits. Bowel: Distension of the rectum with ultrasound gel. No rectal mass is seen. Visualized bowel is otherwise unremarkable, including the sigmoid colon. Vascular/Lymphatic: No evidence of aneurysm. No suspicious pelvic lymphadenopathy. Reproductive:  Suspected postsurgical changes related to prior TURP. Other:  No pelvic ascites. Musculoskeletal: Mild degenerative changes of the lower lumbar spine. IMPRESSION: No rectosigmoid mass is evident on MR. Negative pelvic MRI. Electronically Signed   By: Julian Hy M.D.   On: 06/24/2020 12:15   MR ABDOMEN W WO CONTRAST  Result Date: 06/24/2020 CLINICAL DATA:  Inpatient. Frequent UTI. COPD and pulmonary fibrosis with chronic respiratory failure. Confusion. Concern for rectal mass on recent CT. EXAM: MRI ABDOMEN WITHOUT AND WITH CONTRAST TECHNIQUE: Multiplanar  multisequence MR imaging of the abdomen was performed both before and after the administration of intravenous contrast. CONTRAST:  7.18mL GADAVIST GADOBUTROL 1 MMOL/ML IV SOLN COMPARISON:  06/16/2020 CT abdomen/pelvis. FINDINGS: Lower chest: Small dependent bilateral pleural effusions, right greater than left. Hepatobiliary: Normal liver size and configuration. No hepatic steatosis. No liver mass. Cholecystectomy. Bile ducts are within normal post cholecystectomy limits. Common bile duct diameter 5 mm. No choledocholithiasis. No biliary masses, strictures or beading. Probable 12 mm diameter cystic duct remnant in the porta hepatis (series 4/image 14). Pancreas: No pancreatic mass or duct dilation.  No pancreas divisum. Spleen: Normal size. No mass. Adrenals/Urinary Tract: Normal adrenals. No hydronephrosis. Scattered simple subcentimeter renal cortical cysts in both kidneys. No suspicious renal masses. Stomach/Bowel: Normal non-distended stomach. Visualized small and large bowel is normal caliber, with no bowel wall thickening. Vascular/Lymphatic: Atherosclerotic nonaneurysmal abdominal aorta. Patent portal, splenic, hepatic and renal veins. Retroaortic left renal vein. No pathologically enlarged lymph nodes in the abdomen. Other: No abdominal ascites or focal fluid collection. Musculoskeletal: No aggressive appearing focal osseous lesions. Mild L4 vertebral compression fracture, chronic appearing. IMPRESSION: 1. No lymphadenopathy or other findings to suggest metastatic disease in the abdomen. MRI pelvis to be reported separately. 2. Bile ducts are within normal post cholecystectomy limits (CBD diameter 5 mm). No choledocholithiasis. Probable small cystic duct remnant in the porta hepatis. 3. Small dependent bilateral pleural effusions, right greater than left. 4. Mild L4 vertebral compression fracture, chronic appearing. Electronically Signed   By: Ilona Sorrel M.D.   On: 06/24/2020 07:01   CT ABDOMEN PELVIS W  CONTRAST  Result Date: 05/30/2020 CLINICAL DATA:  Abdominal pain. EXAM: CT ABDOMEN AND PELVIS WITH CONTRAST TECHNIQUE: Multidetector CT imaging of the abdomen and pelvis was performed using the standard protocol following bolus administration of intravenous contrast. CONTRAST:  136mL OMNIPAQUE IOHEXOL 300 MG/ML  SOLN COMPARISON:  None. FINDINGS: Lower chest: A trace amount of atelectasis is seen within the right lung base. A very small right pleural effusion is noted. Hepatobiliary: No focal liver abnormality is seen. Status post cholecystectomy. No biliary dilatation. Pancreas: Unremarkable. No pancreatic ductal dilatation or surrounding inflammatory changes. Spleen: Normal in size without focal abnormality. Adrenals/Urinary Tract: Adrenal glands are unremarkable. Kidneys are normal, without renal calculi, focal lesion, or hydronephrosis. Bladder is unremarkable. Stomach/Bowel: Stomach is within normal limits. Appendix appears normal. No evidence of bowel dilatation.  There is marked severity thickening of the rectum and adjacent portion of the distal sigmoid colon (axial CT images 71 through 75, CT series number 2). This is slightly asymmetric in appearance, right greater than left. No associated inflammatory fat stranding is seen. Vascular/Lymphatic: There is marked severity calcification and atherosclerosis of the abdominal aorta and bilateral common iliac arteries, without evidence of aneurysmal dilatation. No enlarged abdominal or pelvic lymph nodes. Reproductive: The prostate gland is mildly enlarged. Other: No abdominal wall hernia or abnormality. No abdominopelvic ascites. Musculoskeletal: Moderate to marked severity multilevel degenerative changes seen throughout the lumbar spine. IMPRESSION: 1. Asymmetric of the rectum and adjacent portion of the sigmoid colon, which may be secondary to an underlying mass. Correlation with physical examination and MRI is recommended. 2. Evidence of prior cholecystectomy.  3. Very small right pleural effusion. 4. Mildly enlarged prostate gland. 5. Moderate to marked severity multilevel degenerative changes throughout the lumbar spine. 6. Aortic atherosclerosis. Aortic Atherosclerosis (ICD10-I70.0). Electronically Signed   By: Virgina Norfolk M.D.   On: 06/11/2020 21:22   DG Chest Port 1 View  Result Date: 06/24/2020 CLINICAL DATA:  Shortness of breath, pulmonary fibrosis, altered mental status EXAM: PORTABLE CHEST 1 VIEW COMPARISON:  06/01/2020 FINDINGS: Mild cardiomegaly. Emphysema. Mild, diffuse interstitial opacity. Bibasilar fibrotic scarring. The visualized skeletal structures are unremarkable. IMPRESSION: Emphysema and bibasilar fibrotic change with mild, diffuse interstitial opacity, potentially edema. No focal airspace opacity. Electronically Signed   By: Eddie Candle M.D.   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    Patient/Family are satisfied with care plan and all questions have been answered.  This document was prepared using Dragon voice recognition software and may include unintentional dictation errors.     Ottie Glazier, M.D.  Division of [place]

## 2020-06-27 ENCOUNTER — Inpatient Hospital Stay: Payer: PPO

## 2020-06-27 ENCOUNTER — Other Ambulatory Visit: Payer: PPO

## 2020-06-27 DIAGNOSIS — J9611 Chronic respiratory failure with hypoxia: Secondary | ICD-10-CM | POA: Diagnosis not present

## 2020-06-27 LAB — BASIC METABOLIC PANEL
Anion gap: 11 (ref 5–15)
BUN: 27 mg/dL — ABNORMAL HIGH (ref 8–23)
CO2: 28 mmol/L (ref 22–32)
Calcium: 9.2 mg/dL (ref 8.9–10.3)
Chloride: 95 mmol/L — ABNORMAL LOW (ref 98–111)
Creatinine, Ser: 0.7 mg/dL (ref 0.61–1.24)
GFR calc non Af Amer: >60 mL/min (ref >60)
Glucose, Bld: 138 mg/dL — ABNORMAL HIGH (ref 70–99)
Potassium: 4.1 mmol/L (ref 3.5–5.1)
Sodium: 134 mmol/L — ABNORMAL LOW (ref 135–145)

## 2020-06-27 LAB — GLUCOSE, CAPILLARY
Glucose-Capillary: 143 mg/dL — ABNORMAL HIGH (ref 70–99)
Glucose-Capillary: 122 mg/dL — ABNORMAL HIGH (ref 70–99)
Glucose-Capillary: 97 mg/dL (ref 70–99)
Glucose-Capillary: 160 mg/dL — ABNORMAL HIGH (ref 70–99)

## 2020-06-27 IMAGING — DX DG CHEST 1V PORT
1 series · 1 of 1 positions shown · non-contrast
Comparison: [DATE]

CLINICAL DATA: Altered mental status

EXAM:
PORTABLE CHEST 1 VIEW

[chest ap]
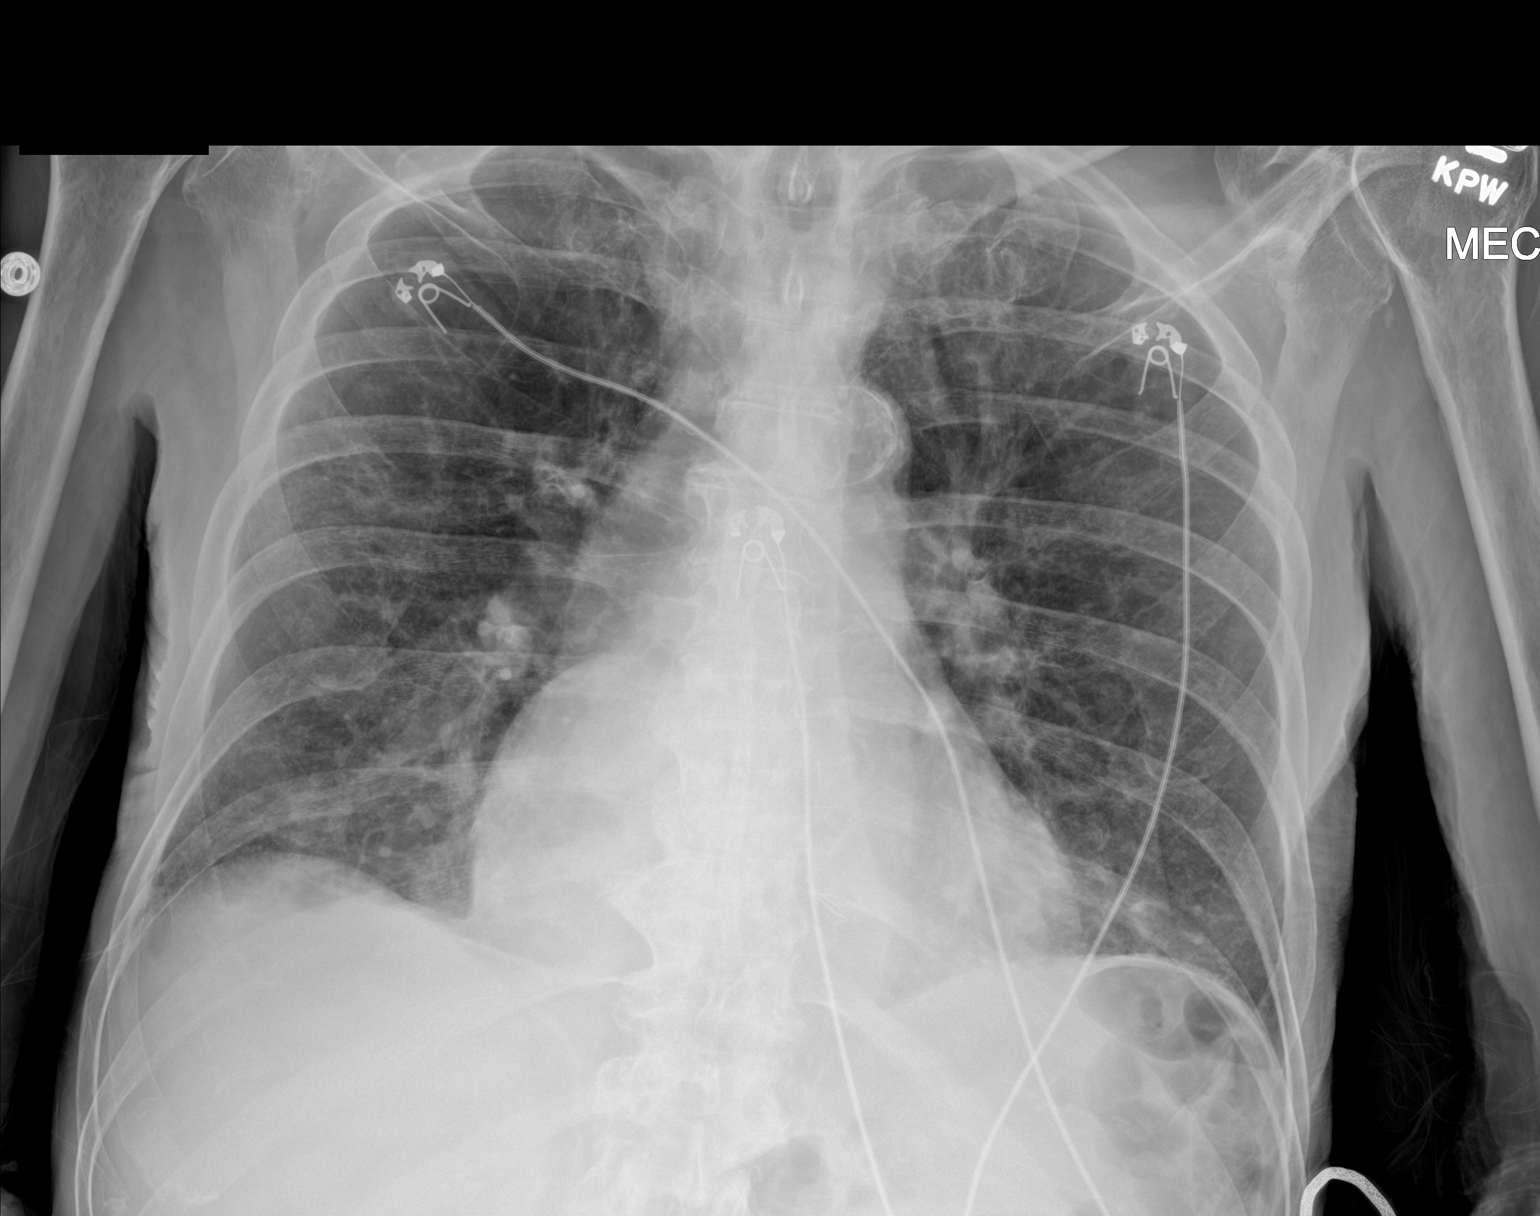

[1 of 1 positions shown; findings below may reference images not displayed]

FINDINGS: Chronic interstitial prominence. Improved aeration at the left lung
base. No pleural effusion or pneumothorax. Stable cardiomediastinal
contours.
IMPRESSION: Improved aeration at the left lung base since [DATE]. No new
findings.

## 2020-06-27 MED ORDER — DILTIAZEM HCL ER COATED BEADS 180 MG PO CP24
300.0000 mg | ORAL_CAPSULE | Freq: Every day | ORAL | Status: DC
  Administered 2020-06-28 – 2020-06-29 (×2): 300 mg via ORAL
  Filled 2020-06-27 (×2): qty 1

## 2020-06-27 MED ORDER — MAGIC MOUTHWASH
15.0000 mL | Freq: Three times a day (TID) | ORAL | Status: DC
  Administered 2020-06-27 – 2020-07-08 (×32): 15 mL via ORAL
  Filled 2020-06-27 (×26): qty 15
  Filled 2020-06-27: qty 20
  Filled 2020-06-27 (×8): qty 15
  Filled 2020-06-27: qty 20
  Filled 2020-06-27 (×10): qty 15
  Filled 2020-06-27: qty 20

## 2020-06-27 NOTE — Progress Notes (Signed)
Pt assisted back to bed with max 2 assist tolerated well. Daughter remains at bedside.

## 2020-06-27 NOTE — Evaluation (Signed)
Physical Therapy Evaluation Patient Details Name: Eric Li MRN: [id number] DOB: 04/16/1935 Today's Date: 06/27/2020   History of Present Illness  Eric Li is an 85 y/o male who was admitted with chief complaint of abdominal pain and confusion. Pt found to have a UTI. PMH includes paroxysmal A-fib on Apixaban, HTN, HLD, CAD, CHF, COPD, pulmonary fibrosis with chronic respiratory failure, on home O2 at 3L, bilateral hearing loss, nephrolitiasis, SOB on exertion, and statin intolerance.  Clinical Impression  Pt received pleasantly confused in bed with granddaughters at bedside and OT present. Pt's PLOF required varying assistance for transfers and mobility is primarily via wheelchair with granddaughter reporting that he was able to take very short, shuffled steps during stand pivot transfer. Pt able to lift all 4 extremities against gravity. Pt required +2 A for safety and physical assistance for bed mobility including sidelying to sit and sit to supine from truncal and BLE management. Pt required multimodal cues for sequencing. Pt with poor sitting balance with strong posterior lean requiring heavy max A to remain upright. Verbal cues to reach outside of BOS to help bring COG within BOS with minimal improvement. Pt currently presents with deficits in strength, mobility, functional activity tolerance, and balance. Pt would benefit from skilled PT during acute stay to address aforementioned deficits and STR at discharge to optimize return to PLOF and maximize functional mobility.   Vitals: Prior activity supine in bed: HR 99-104, SpO2 96%, 22 RR During activity: SpO2 95%, HR 125-130 Back in bed: SpO2 95, HR 120- 125  Pt remained on 40L 45% FiO2     Follow Up Recommendations SNF;Supervision for mobility/OOB    Equipment Recommendations  Rolling walker with 5" wheels;Other (comment) (unclear if pt has one or not)    Recommendations for Other Services       Precautions / Restrictions  Precautions Precautions: Fall Restrictions Weight Bearing Restrictions: No      Mobility  Bed Mobility Overal bed mobility: Needs Assistance Bed Mobility: Sidelying to Sit;Sit to Supine   Sidelying to sit: Mod assist;+2 for physical assistance   Sit to supine: Max assist;+2 for physical assistance   General bed mobility comments: Mod+2 A for sidelying to sit transfer for truncal elevation and BLE control over edge of bed; max+2 A for sit to supine for truncal and BLE elevation onto bed; total+2 A to scoot up in bed via drawsheet  Transfers                 General transfer comment: deferred due to safety concerns  Ambulation/Gait             General Gait Details: deferred due to safety concerns  Stairs            Wheelchair Mobility    Modified Rankin (Stroke Patients Only)       Balance Overall balance assessment: Needs assistance Sitting-balance support: Bilateral upper extremity supported;Feet supported Sitting balance-Leahy Scale: Poor Sitting balance - Comments: pt with strong posterior lean with edge of bed sitting requiring heavy max A for upright balance Postural control: Posterior lean                                   Pertinent Vitals/Pain Pain Assessment: No/denies pain    Home Living Family/patient expects to be discharged to:: Private residence Living Arrangements: Spouse/significant other Available Help at Discharge: Family;Available PRN/intermittently Type of Home: House Home Access:  Stairs to enter Entrance Stairs-Rails: Right Entrance Stairs-Number of Steps: 3-4 Home Layout: One level Home Equipment: Shower seat;Wheelchair - manual Additional Comments: Granddaughters, who were present for eval, report that the closest family members to assist are 30 min to 1 hour away; unclear if pt owns walker or not    Prior Function Level of Independence: Needs assistance   Gait / Transfers Assistance Needed: assistance  levels differ depending on pt's strength on a particular day however sounds as if primarily requires mod A - total A; granddaughters state that pt took very small shuffled steps to get from one surface to another     Comments: Granddaughter states pt primarily gets around in wheelchair and reports previous fall history as pt's legs just sort of give out.     Hand Dominance        Extremity/Trunk Assessment   Upper Extremity Assessment Upper Extremity Assessment: Generalized weakness (grossly 3- to 4+/5 bilaterally)    Lower Extremity Assessment Lower Extremity Assessment: Generalized weakness (grossly 3- to 4+/5 bilaterally)       Communication   Communication: HOH  Cognition Arousal/Alertness: Awake/alert Behavior During Therapy: WFL for tasks assessed/performed Overall Cognitive Status: History of cognitive impairments - at baseline                                 General Comments: Pt has cognitive deficits at baseline intermittently however deficits are increased at this time.      General Comments      Exercises Other Exercises Other Exercises: pt performed alternating seated marches while seated edge of bed requiring verbal and tactile cues   Assessment/Plan    PT Assessment Patient needs continued PT services  PT Problem List Decreased strength;Decreased activity tolerance;Decreased balance;Decreased mobility;Decreased coordination;Decreased cognition;Decreased safety awareness;Cardiopulmonary status limiting activity;Decreased skin integrity       PT Treatment Interventions DME instruction;Gait training;Stair training;Functional mobility training;Therapeutic activities;Therapeutic exercise;Balance training;Cognitive remediation;Patient/family education    PT Goals (Current goals can be found in the Care Plan section)  Acute Rehab PT Goals Patient Stated Goal: to go home - not to go to a facility unless absolutely necessary (per granddaughter) PT  Goal Formulation: With family Time For Goal Achievement: 07/11/20 Potential to Achieve Goals: Fair    Frequency Min 2X/week   Barriers to discharge Decreased caregiver support      Co-evaluation PT/OT/SLP Co-Evaluation/Treatment: Yes Reason for Co-Treatment: Necessary to address cognition/behavior during functional activity;For patient/therapist safety;To address functional/ADL transfers PT goals addressed during session: Mobility/safety with mobility;Balance;Strengthening/ROM         AM-PAC PT "6 Clicks" Mobility  Outcome Measure Help needed turning from your back to your side while in a flat bed without using bedrails?: A Lot Help needed moving from lying on your back to sitting on the side of a flat bed without using bedrails?: A Lot Help needed moving to and from a bed to a chair (including a wheelchair)?: A Lot Help needed standing up from a chair using your arms (e.g., wheelchair or bedside chair)?: A Lot Help needed to walk in hospital room?: A Lot Help needed climbing 3-5 steps with a railing? : A Lot 6 Click Score: 12    End of Session Equipment Utilized During Treatment: Oxygen Activity Tolerance: Patient tolerated treatment well;Patient limited by fatigue Patient left: in bed;with call bell/phone within reach;with bed alarm set;with family/visitor present Nurse Communication: Mobility status PT Visit Diagnosis: Unsteadiness on feet (  R26.81);Other abnormalities of gait and mobility (R26.89);Repeated falls (R29.6);Muscle weakness (generalized) (M62.81);History of falling (Z91.81)    Time: 2904-7533 PT Time Calculation (min) (ACUTE ONLY): 29 min   Charges:              Vale Haven, SPT  Vale Haven 06/27/2020, 12:52 PM

## 2020-06-27 NOTE — Progress Notes (Signed)
Visitation approval by Posey Pronto: Eric Li and Eric Li are the designated visitors that may rotate to stay with patient.  1 visitor during the dayshift and 1 visitors during the overnight shift.  Wife Eric Li may come visit for 2-3 hours a day in addition to the three mentioned visitors.

## 2020-06-27 NOTE — Consult Note (Signed)
PHARMACY CONSULT NOTE - FOLLOW UP  Pharmacy Consult for Electrolyte Monitoring and Replacement   Recent Labs: Potassium (mmol/L)  Date Value  06/27/2020 4.1   Magnesium (mg/dL)  Date Value  06/25/2020 2.6 (H)   Calcium (mg/dL)  Date Value  06/27/2020 9.2   Albumin (g/dL)  Date Value  05/23/2020 3.1 (L)   Phosphorus (mg/dL)  Date Value  06/26/2020 3.4   Sodium (mmol/L)  Date Value  06/27/2020 134 (L)   Assessment: 85 yo male with medical history including paroxysmal Afib, HTN, CAD, COPD and pulmonary fibrosis on 3L O2 at baseline with acute hypoxemic respiratory failure and E Coli UTI on ceftriaxone.  Pharmacy has been consulted to monitor and replenish electrolytes as indicated.  Diuresis with IV Lasix 20 mg BID Scheduled replacement with PO KCl 20 mEq daily  Goal of Therapy:  K ~ 4 Mg ~ 2 All other electrolytes WNL  Plan:  --Patient has not required replacement x3 days; pharmacy will sign off at this time.  Sherilyn Banker, PharmD Pharmacy Resident  06/27/2020 9:51 AM

## 2020-06-27 NOTE — Progress Notes (Addendum)
PROGRESS NOTE    Eric Li  QPR:[id number] DOB: 24-Feb-1935 DOA: 06/08/2020 PCP: [name], MD   Brief Narrative: Taken from prior notes. Eric Li a 85 year old male with history of paroxysmal A. fib on apixaban, HTN, CAD, frequent UTIs, COPD and pulmonary fibrosis with chronic respiratory failure on home O2 at 3 L presenting with altered mental status secondary to UTI.  Completed treatment with ceftriaxone. Developed acute on chronic hypoxic respiratory failure secondary to exacerbation of his pulmonary fibrosis requiring heated HFNC. Pulmonary was consulted.  Subjective: Patient has no new complaint today.  He was oriented to self only.  2 granddaughters were in the room.  There was some concern of dementia which is going on for a while now.  Assessment & Plan:   Principal Problem:   Acute metabolic encephalopathy Active Problems:   CAD (coronary artery disease)   HTN (hypertension)   UTI (urinary tract infection)   PAF (paroxysmal atrial fibrillation) (HCC)   Pulmonary fibrosis (HCC)   Chronic respiratory failure with hypoxia (HCC)   COPD, severe (HCC)   Abnormal computed tomography angiography (CTA) of abdomen and pelvis   Goals of care, counseling/discussion   Palliative care by specialist   DNR (do not resuscitate)  Acute on chronic respiratory failure with hypoxia.  Most likely secondary to pulmonary fibrosis exacerbation.  Pulmonary was consulted and they started him on steroid.  Chest x-ray done on 06/25/2020 with left basilar opacity with concern of aspiration pneumonia.  Patient completed a course with ceftriaxone.  Subsequent chest x-ray with some improvement in that infiltrate. Remains on heated HFNC at 40 L and 40% of FiO2.  Baseline oxygen requirement of 3 L. -Continue with Solu-Medrol. -Continue with DuoNeb. -Continue with MetaNeb. -Continue with IV Lasix. -Try weaning if tolerates.  Sepsis secondary to UTI.  Resolved. Initially met sepsis  criteria with UTI.  Urine culture grew E. coli which were pretty pansensitive except quinolones.  Completed a course of ceftriaxone.  Encephalopathy.  Some waxing and waning in his mental status.  He appears quite alert but oriented to self only when saw today.  High risk for delirium and sundowning secondary to his age and underlying dementia. -Dementia precautions. -Continue to monitor. -Palliative care was also consulted-patient is DNR and will continue current scope of care.  Oral thrush.  Oral thrush was noted today.  Patient is on steroid. -Magic mouthwash.  History of coronary artery disease.  No chest pain or acute EKG changes. -Continue with home dose of metoprolol, nitroglycerin. -Currently not on Zetia.  Hypertension.  Blood pressure within goal. -Continue home meds.  Paroxysmal atrial fibrillation.  Current heart rate in high 30s to mid 40s. -Decrease the dose of Cardizem from 360 to 300. -Continue with low-dose metoprolol-can discontinue if needed. -Continue with Eliquis.  Incidental abnormal computed tomography angiography (CTA) of abdomen and pelvis -Followed by MRI, no mass was identified -MRI of abdomen pelvis completed on 06/23/2020 -no comments on sigmoid mass -GI consult has been canceled.   Objective: Vitals:   06/27/20 0507 06/27/20 0750 06/27/20 0845 06/27/20 1128  BP: 111/75 138/83  122/80  Pulse: (!) 38 (!) 46  65  Resp: (!) _0 Temp: 98.3 F (36.8 C) (!) 97.4 F (36.3 C)  97.6 F (36.4 C)  TempSrc: Oral Oral  Oral  SpO2: 95% 98% 92% 97%  Weight: 55.1 kg     Height:        Intake/Output Summary (Last 24 hours) at 06/27/2020  1501 Last data filed at 06/27/2020 1015 Gross per 24 hour  Intake 240 ml  Output 2375 ml  Net -2135 ml   Filed Weights   06/05/2020 1237 06/27/20 0507  Weight: 53.5 kg 55.1 kg    Examination:  General exam: Pleasant elderly man, appears calm and comfortable  Respiratory system: Clear to auscultation.  Respiratory effort normal. Cardiovascular system: S1 & S2 heard, RRR. No JVD, murmurs, rubs, gallops or clicks. Gastrointestinal system: Soft, nontender, nondistended, bowel sounds positive. Central nervous system: Alert and oriented to self only. No focal neurological deficits. Extremities: No edema, no cyanosis, pulses intact and symmetrical. Psychiatry: Judgement and insight appear impaired.   DVT prophylaxis: Eliquis Code Status: DNR Family Communication: 2 granddaughters were updated at bedside. Disposition Plan:  Status is: Inpatient  Remains inpatient appropriate because:Inpatient level of care appropriate due to severity of illness   Dispo: The patient is from: Home              Anticipated d/c is to: SNF              Anticipated d/c date is: > 3 days              Patient currently is not medically stable to d/c.  Patient is high risk for deterioration and death secondary to advanced age and multiple underlying comorbidities.  Palliative care has been consulted.  Patient is now DNR.   Consultants:   Pulmonary  Procedures:  Antimicrobials:   Data Reviewed: I have personally reviewed following labs and imaging studies  CBC: Recent Labs  Lab 06/10/2020 1307 06/22/20 0440 06/25/20 0439  WBC 11.7* 14.1* 14.5*  NEUTROABS 8.2*  --   --   HGB 14.3 14.1 15.1  HCT 42.0 43.7 44.4  MCV 88.6 91.0 87.9  PLT 201 216 409   Basic Metabolic Panel: Recent Labs  Lab 06/23/20 1019 06/23/20 1019 06/24/20 0739 06/24/20 1910 06/25/20 0439 06/26/20 0359 06/27/20 0548  NA 138  --  138  --  139 135 134*  K 3.5   < > 2.9* 3.1* 3.8 3.8 4.1  CL 109  --  106  --  104 99 95*  CO2 17*  --  22  --  _0 GLUCOSE 145*  --  146*  --  231* 214* 138*  BUN 23  --  19  --  18 28* 27*  CREATININE 0.74  --  0.73  --  0.67 0.71 0.70  CALCIUM 8.6*  --  8.1*  --  8.6* 8.7* 9.2  MG  --   --   --   --  2.6*  --   --   PHOS  --   --   --   --   --  3.4  --    < > = values in this interval  not displayed.   GFR: Estimated Creatinine Clearance: 54.5 mL/min (by C-G formula based on SCr of 0.7 mg/dL). Liver Function Tests: Recent Labs  Lab 05/26/2020 1307  AST 25  ALT 26  ALKPHOS 107  BILITOT 1.1  PROT 6.3*  ALBUMIN 3.1*   No results for input(s): LIPASE, AMYLASE in the last 168 hours. No results for input(s): AMMONIA in the last 168 hours. Coagulation Profile: Recent Labs  Lab 06/20/2020 1307  INR 1.3*   Cardiac Enzymes: No results for input(s): CKTOTAL, CKMB, CKMBINDEX, TROPONINI in the last 168 hours. BNP (last 3 results) No results for input(s): PROBNP in the last  8760 hours. HbA1C: Recent Labs    06/25/20 0439  HGBA1C 6.9*   CBG: Recent Labs  Lab 06/26/20 1129 06/26/20 1556 06/26/20 2157 06/27/20 0752 06/27/20 1129  GLUCAP 176* 157* 188* 143* 122*   Lipid Profile: No results for input(s): CHOL, HDL, LDLCALC, TRIG, CHOLHDL, LDLDIRECT in the last 72 hours. Thyroid Function Tests: No results for input(s): TSH, T4TOTAL, FREET4, T3FREE, THYROIDAB in the last 72 hours. Anemia Panel: No results for input(s): VITAMINB12, FOLATE, FERRITIN, TIBC, IRON, RETICCTPCT in the last 72 hours. Sepsis Labs: Recent Labs  Lab 06/12/2020 1307 06/07/2020 1851  LATICACIDVEN 2.0* 1.6    Recent Results (from the past 240 hour(s))  Culture, blood (Routine x 2)     Status: None   Collection Time: 06/13/2020  1:07 PM   Specimen: BLOOD LEFT ARM  Result Value Ref Range Status   Specimen Description BLOOD LEFT ARM  Final   Special Requests   Final    BOTTLES DRAWN AEROBIC AND ANAEROBIC Blood Culture results may not be optimal due to an excessive volume of blood received in culture bottles   Culture   Final    NO GROWTH 5 DAYS Performed at [facility], [address] [place], [place] [address]    Report Status 06/26/2020 FINAL  Final  Culture, blood (Routine x 2)     Status: None   Collection Time: 06/19/2020  8:37 PM   Specimen: BLOOD  Result Value Ref Range  Status   Specimen Description BLOOD RIGHT ANTECUBITAL  Final   Special Requests   Final    BOTTLES DRAWN AEROBIC AND ANAEROBIC Blood Culture adequate volume   Culture   Final    NO GROWTH 5 DAYS Performed at [facility], [address] [place], [place] [address]    Report Status 06/26/2020 FINAL  Final  Urine Culture     Status: Abnormal   Collection Time: 05/25/2020  8:37 PM   Specimen: Urine, Random  Result Value Ref Range Status   Specimen Description   Final    URINE, RANDOM Performed at [facility], [address], [place], [place] [address]    Special Requests   Final    NONE Performed at [facility], [address] [place], [place] [address]    Culture >=100,000 COLONIES/mL ESCHERICHIA COLI (A)  Final   Report Status 06/23/2020 FINAL  Final   Organism ID, Bacteria ESCHERICHIA COLI (A)  Final      Susceptibility   Escherichia coli - MIC*    AMPICILLIN <=2 SENSITIVE Sensitive     CEFAZOLIN <=4 SENSITIVE Sensitive     CEFTRIAXONE <=0.25 SENSITIVE Sensitive     CIPROFLOXACIN >=4 RESISTANT Resistant     GENTAMICIN <=1 SENSITIVE Sensitive     IMIPENEM <=0.25 SENSITIVE Sensitive     NITROFURANTOIN <=16 SENSITIVE Sensitive     TRIMETH/SULFA <=20 SENSITIVE Sensitive     AMPICILLIN/SULBACTAM <=2 SENSITIVE Sensitive     PIP/TAZO <=4 SENSITIVE Sensitive     * >=100,000 COLONIES/mL ESCHERICHIA COLI  Respiratory Panel by RT PCR (Flu A&B, Covid) - Nasopharyngeal Swab     Status: None   Collection Time: 06/09/2020  8:46 PM   Specimen: Nasopharyngeal Swab  Result Value Ref Range Status   SARS Coronavirus 2 by RT PCR NEGATIVE NEGATIVE Final    Comment: (NOTE) SARS-CoV-2 target nucleic acids are NOT DETECTED.  The SARS-CoV-2 RNA is generally detectable in upper respiratoy specimens during the acute phase of infection. The lowest concentration  of SARS-CoV-2 viral copies this assay can detect is 131 copies/mL. A negative result does not preclude  SARS-Cov-2 infection and should not be used as the sole basis for treatment or other patient management decisions. A negative result may occur with  improper specimen collection/handling, submission of specimen other than nasopharyngeal swab, presence of viral mutation(s) within the areas targeted by this assay, and inadequate number of viral copies (<131 copies/mL). A negative result must be combined with clinical observations, patient history, and epidemiological information. The expected result is Negative.  Fact Sheet for Patients:  PinkCheek.be  Fact Sheet for Healthcare Providers:  GravelBags.it  This test is no t yet approved or cleared by the [place] FDA and  has been authorized for detection and/or diagnosis of SARS-CoV-2 by FDA under an Emergency Use Authorization (EUA). This EUA will remain  in effect (meaning this test can be used) for the duration of the COVID-19 declaration under Section 564(b)(1) of the Act, 21 U.S.C. section 360bbb-3(b)(1), unless the authorization is terminated or revoked sooner.     Influenza A by PCR NEGATIVE NEGATIVE Final   Influenza B by PCR NEGATIVE NEGATIVE Final    Comment: (NOTE) The Xpert Xpress SARS-CoV-2/FLU/RSV assay is intended as an aid in  the diagnosis of influenza from Nasopharyngeal swab specimens and  should not be used as a sole basis for treatment. Nasal washings and  aspirates are unacceptable for Xpert Xpress SARS-CoV-2/FLU/RSV  testing.  Fact Sheet for Patients: PinkCheek.be  Fact Sheet for Healthcare Providers: GravelBags.it  This test is not yet approved or cleared by the [place] FDA and  has been authorized for detection and/or diagnosis of SARS-CoV-2 by  FDA under an Emergency Use Authorization (EUA). This EUA will remain  in effect (meaning this test can be used) for the duration of the    Covid-19 declaration under Section 564(b)(1) of the Act, 21  U.S.C. section 360bbb-3(b)(1), unless the authorization is  terminated or revoked. Performed at [facility], [address] [place], [place] [address]   Urine Culture     Status: Abnormal   Collection Time: 06/23/20  3:07 PM   Specimen: Urine, Random  Result Value Ref Range Status   Specimen Description   Final    URINE, RANDOM Performed at [facility], [address], [place], [place] [address]    Special Requests   Final    NONE Performed at [facility], [address] [place], [place] [address]    Culture MULTIPLE SPECIES PRESENT, SUGGEST RECOLLECTION (A)  Final   Report Status 06/25/2020 FINAL  Final     Radiology Studies: DG Chest Port 1 View  Result Date: 06/27/2020 CLINICAL DATA:  Altered mental status EXAM: PORTABLE CHEST 1 VIEW COMPARISON:  06/25/2020 FINDINGS: Chronic interstitial prominence. Improved aeration at the left lung base. No pleural effusion or pneumothorax. Stable cardiomediastinal contours. IMPRESSION: Improved aeration at the left lung base since 06/25/2020. No new findings. Electronically Signed   By: Macy Mis M.D.   On: 06/27/2020 08:07    Scheduled Meds: . acidophilus  1 capsule Oral Daily  . apixaban  2.5 mg Oral BID  . diltiazem  360 mg Oral Daily  . escitalopram  10 mg Oral Daily  . fluticasone  2 spray Each Nare Daily  . furosemide  20 mg Intravenous Q12H  . insulin aspart  0-9 Units Subcutaneous TID WC  . magic mouthwash  15 mL Oral TID  . methylPREDNISolone (SOLU-MEDROL) injection  20  mg Intravenous Q24H  . metoprolol succinate  25 mg Oral Daily  . mometasone-formoterol  2 puff Inhalation BID  . multivitamin with minerals  1 tablet Oral Daily  . pantoprazole  40 mg Oral Daily  . potassium chloride  20 mEq Oral Daily   Continuous Infusions:   LOS: 6 days   Time spent: 35 minutes.  Lorella Nimrod, MD [facility]  If 7PM-7AM,  please contact night-coverage [email or web address]  06/27/2020, 3:01 PM   This record has been created using Systems analyst. Errors have been sought and corrected,but may not always be located. Such creation errors do not reflect on the standard of care.

## 2020-06-27 NOTE — Progress Notes (Signed)
Pt up to chair with 2 assist and daughter at bedside.

## 2020-06-27 NOTE — Evaluation (Signed)
Occupational Therapy Evaluation Patient Details Name: Eric Li MRN: [id number] DOB: [birth year] Today's Date: 06/27/2020    History of Present Illness Eric Li is an 85 y/o male who was admitted with chief complaint of abdominal pain and confusion. Pt found to have a UTI. PMH includes paroxysmal A-fib on Apixaban, HTN, HLD, CAD, CHF, COPD, pulmonary fibrosis with chronic respiratory failure, on home O2 at 3L, bilateral hearing loss, nephrolitiasis, SOB on exertion, and statin intolerance.   Clinical Impression   Eric Li was seen for OT evaluation this date. Pt received semi-supine in bed. He remains pleasantly confused t/o session. Family members at bedside provide information regarding PLOF/home set-up. Prior to hospital admission, pt required assistance from family members for ADL management including assist from his spouse for all STS transfers to his manual WC, as well as assistance with bathing, dressing, and toileting. Granddaughters at bedside report pt level of assist varies from day to day. Pt lives with his spouse in a 1 level home with at least 3 steps to enter. Currently pt demonstrates impairments as described below (See OT problem list) which functionally limit his ability to perform ADL/self-care tasks. Pt currently requires +2 mod/max A for bed mobility. He requires MAX A to maintain sitting at EOB 2/2 significant posterior lean. Anticipate Min A for bed level grooming and self-feeding. Pt would benefit from skilled OT services to address noted impairments and functional limitations (see below for any additional details) in order to maximize safety and independence while minimizing falls risk and caregiver burden. Upon hospital discharge, recommend to maximize pt safety and return to PLOF.      Follow Up Recommendations  SNF;Supervision/Assistance - 24 hour (HHOT if family declines SNF)    Equipment Recommendations  3 in 1 bedside commode    Recommendations for  Other Services       Precautions / Restrictions Precautions Precautions: Fall Restrictions Weight Bearing Restrictions: No      Mobility Bed Mobility Overal bed mobility: Needs Assistance Bed Mobility: Sidelying to Sit;Sit to Supine   Sidelying to sit: Mod assist;+2 for physical assistance   Sit to supine: Max assist;+2 for physical assistance   General bed mobility comments: Mod+2 A for sidelying to sit transfer for truncal elevation and BLE control over edge of bed; max+2 A for sit to supine for truncal and BLE elevation onto bed; total+2 A to scoot up in bed via drawsheet  Transfers                 General transfer comment: deferred for pt safety.    Balance Overall balance assessment: Needs assistance Sitting-balance support: Bilateral upper extremity supported;Feet supported Sitting balance-Leahy Scale: Poor Sitting balance - Comments: pt with strong posterior lean with edge of bed sitting requiring heavy max A for upright balance Postural control: Posterior lean                                 ADL either performed or assessed with clinical judgement   ADL Overall ADL's : Needs assistance/impaired Eating/Feeding: Minimal assistance;Cueing for sequencing;Bed level   Grooming: Bed level;Minimal assistance;Cueing for sequencing   Upper Body Bathing: Bed level;Moderate assistance   Lower Body Bathing: Bed level;Moderate assistance   Upper Body Dressing : Moderate assistance;Bed level   Lower Body Dressing: Maximal assistance;+2 for physical assistance;Sit to/from stand   Toilet Transfer: BSC;+2 for physical assistance;Maximal assistance;Squat-pivot   Toileting- Water quality scientist and Hygiene:  Sit to/from stand;Maximal assistance;+2 for physical assistance         General ADL Comments: Pt requires assist for mobility at baseline. He is unable to maintain sitting balance at EOB and requires MAX +1 to sit upright with feet supported on  floor.     Vision Baseline Vision/History: Wears glasses Wears Glasses: At all times (Per grd daugher has not worn them in years.) Additional Comments: Pt unable to state. Appears to visually track appropriately during session.     Perception     Praxis      Pertinent Vitals/Pain Pain Assessment: No/denies pain     Hand Dominance     Extremity/Trunk Assessment Upper Extremity Assessment Upper Extremity Assessment: Generalized weakness (Grossly 3- to 4/5 t/o BUE.)   Lower Extremity Assessment Lower Extremity Assessment: Generalized weakness;Defer to PT evaluation       Communication Communication Communication: HOH   Cognition Arousal/Alertness: Awake/alert Behavior During Therapy: WFL for tasks assessed/performed Overall Cognitive Status: History of cognitive impairments - at baseline                                 General Comments: Pt has cognitive deficits at baseline intermittently however family at bedside report deficits are increased at this time.   General Comments  Pt noted with bruising across B hands, per family has reported R hand pain.    Exercises Exercises: Other exercises Other Exercises Other Exercises: pt performed alternating seated marches while seated edge of bed requiring verbal and tactile cues Other Exercises: OT facilitates pt/caregiver education on pursed lip breathing strategies for functional mobility, falls prevention strategies, and delirium prevention strategies including maintenance of pt sleep/wake cycles while in the hospital.OT/PT facilitate sup<>sit, and seated activity this date.   Shoulder Instructions      Home Living Family/patient expects to be discharged to:: Private residence Living Arrangements: Spouse/significant other Available Help at Discharge: Family;Available PRN/intermittently Type of Home: House Home Access: Stairs to enter [employer] of Steps: 3-4 Entrance Stairs-Rails: Right Home  Layout: One level     Bathroom Shower/Tub: Tub/shower unit;Door   [employer] Toilet: Handicapped height     Home Equipment: Civil engineer, contracting;Wheelchair - manual   Additional Comments: Granddaughters, who were present for eval, report that the closest family members to assist are 30 min to 1 hour away; unclear if pt owns walker or not      Prior Functioning/Environment Level of Independence: Needs assistance  Gait / Transfers Assistance Needed: assistance levels differ depending on pt's strength on a particular day however sounds as if primarily requires mod A - total A; granddaughters state that pt took very small shuffled steps to get from one surface to another with physical assist from spouse or family for STS.     Comments: Granddaughter states pt primarily gets around in wheelchair and reports previous fall history as pt's legs just sort of give out and he is prone to dizzy spells.        OT Problem List: Decreased strength;Decreased coordination;Cardiopulmonary status limiting activity;Decreased activity tolerance;Decreased safety awareness;Impaired balance (sitting and/or standing);Decreased knowledge of use of DME or AE;Decreased cognition      OT Treatment/Interventions: Self-care/ADL training;Therapeutic exercise;Therapeutic activities;DME and/or AE instruction;Patient/family education;Balance training;Energy conservation    OT Goals(Current goals can be found in the care plan section) Acute Rehab OT Goals Patient Stated Goal: to go home - not to go to a facility unless absolutely necessary OT Goal  Formulation: With patient/family Time For Goal Achievement: 07/11/20 Potential to Achieve Goals: Fair ADL Goals Pt Will Perform Grooming: sitting;with min assist Pt Will Transfer to Toilet: squat pivot transfer;bedside commode;with min assist Pt Will Perform Toileting - Clothing Manipulation and hygiene: with min assist;sitting/lateral leans;with caregiver independent in  assisting;with adaptive equipment (c LRAD PRN for improved safety and functional independence.)  OT Frequency: Min 1X/week   Barriers to D/C: Inaccessible home environment;Decreased caregiver support          Co-evaluation PT/OT/SLP Co-Evaluation/Treatment: Yes Reason for Co-Treatment: Necessary to address cognition/behavior during functional activity;For patient/therapist safety;Complexity of the patient's impairments (multi-system involvement) PT goals addressed during session: Mobility/safety with mobility;Balance;Strengthening/ROM OT goals addressed during session: ADL's and self-care      AM-PAC OT "6 Clicks" Daily Activity     Outcome Measure Help from another person eating meals?: A Little Help from another person taking care of personal grooming?: A Little Help from another person toileting, which includes using toliet, bedpan, or urinal?: A Lot Help from another person bathing (including washing, rinsing, drying)?: A Lot Help from another person to put on and taking off regular upper body clothing?: A Lot Help from another person to put on and taking off regular lower body clothing?: A Lot 6 Click Score: 14   End of Session    Activity Tolerance: Patient tolerated treatment well Patient left: in bed;with call bell/phone within reach;with bed alarm set;with family/visitor present  OT Visit Diagnosis: Other abnormalities of gait and mobility (R26.89);Muscle weakness (generalized) (M62.81);Other symptoms and signs involving cognitive function                Time: 1127-1208 OT Time Calculation (min): 41 min Charges:  OT General Charges $OT Visit: 1 Visit OT Evaluation $OT Eval Moderate Complexity: 1 Mod OT Treatments $Self Care/Home Management : 8-22 mins  Shara Blazing, M.S., OTR/L Ascom: [phone] 06/27/20, 1:26 PM

## 2020-06-27 NOTE — Progress Notes (Signed)
Pulmonary Medicine          Date: 06/27/2020,   MRN# [id number] [name] 04-Dec-1934     AdmissionWeight: 53.5 kg                 CurrentWeight: 55.1 kg   Referring physician: Dr Roger Shelter   CHIEF COMPLAINT:   Acute hypoxemic respiratory failure   HISTORY OF PRESENT ILLNESS   As per admission h/p Eric Li is a 85 y.o. male with medical history significant for paroxysmal A. fib on apixaban, HTN, CAD, COPD and pulmonary fibrosis with chronic respiratory failure on home O2 at 3 L, who was brought to the emergency room by EMS because of confusion. Family called with concerns for patient waking up disoriented believing he was somewhere else and appeared to be not acting himself and " talking out of his head". Wife also voiced concern for possible UTI as his urination was frequent and with strong odor. He has had no fever or chills, or chest pain or shortness of breath. Has had no nausea, vomiting or change in bowel habits. He is fully vaccinated against Covid. On arrival, his vitals were within normal limits without fever or tachycardia and with O2 sat 97% on O2 at home flow rate . Blood work was significant for WBC of 11,700 and lactic acid of 2>>1.6. Urinalysis showed pyuria. Other blood work mostly unremarkable.  EKG  NSR with RBBB with nonspecific ST-T wave changes  Chest x-ray showed no acute findings, head CT no acute findings. Due to patient complaint of abdominal pain CT abdomen and pelvis with contrast was done which showed asymmetry of the rectum and adjacent portion of the sigmoid colon. Pulmonary consultation for worsening dyspnea with acute on chronic hypoxemic respiratory failure.   06/25/20- patient evaluated at bedside this afternoon.  He had repeat CXR overnight with worsening LLL infiltrate.  Family member at bedside (daughter) shares that he had desatutation event after eating. This suggests aspiration. He made adequate urine with lasix with notable  improvement after diuresis as per family.  MetNEB was unable to be used due to patient being sleepy unable to participate.    06/26/20-  Patient with mild imrpovement.  Noted Palliative evaluation appreciate input. Will institute OT/PT starting tommorow.  Contiue gentle diuresis and MetaNEB therapy as well as current antimicrobials.   06/27/20- Patient seen and examined this morning he seems confused.  Granddaughter at bedside during my examination states he has been having similar symptoms for past 1/2 year and thoughts were revolving around dementia workup. This seems to be episode of sundowning. Patient was significantly more oriented and lucid yesterday mid-day.  He had CXR today with good improvement of previous infiltrates.   PAST MEDICAL HISTORY   Past Medical History:  Diagnosis Date  . Bilateral hearing loss 03/03/2016  . CAD (coronary artery disease) 02/24/2014  . CHF (congestive heart failure) ([place])   . COPD, moderate ([place]) 08/29/2015  . Hemorrhoids 02/24/2014  . HTN (hypertension) 02/24/2014  . Hyperlipidemia, unspecified 02/24/2014  . Nephrolithiasis   . SOB (shortness of breath) on exertion 03/27/2015  . Statin intolerance 03/30/2015     SURGICAL HISTORY   Past Surgical History:  Procedure Laterality Date  . CHOLECYSTECTOMY    . TONSILLECTOMY       FAMILY HISTORY   Family History  Problem Relation Age of Onset  . Bladder Cancer Neg Hx   . Prolactinoma Neg Hx   . Prostate cancer Neg Hx   .  Kidney cancer Neg Hx      SOCIAL HISTORY   Social History   Tobacco Use  . Smoking status: Former Research scientist (life sciences)  . Smokeless tobacco: Never Used  Vaping Use  . Vaping Use: Never used  Substance Use Topics  . Alcohol use: No  . Drug use: No     MEDICATIONS    Home Medication:    Current Medication:  Current Facility-Administered Medications:  .  acetaminophen (TYLENOL) tablet 650 mg, 650 mg, Oral, Q6H PRN, 650 mg at 06/24/20 1711 **OR** acetaminophen (TYLENOL) suppository 650  mg, 650 mg, Rectal, Q6H PRN, Judd Gaudier V, MD .  acidophilus (RISAQUAD) capsule 1 capsule, 1 capsule, Oral, Daily, Shahmehdi, Seyed A, MD, 1 capsule at 06/27/20 1011 .  apixaban (ELIQUIS) tablet 2.5 mg, 2.5 mg, Oral, BID, Judd Gaudier V, MD, 2.5 mg at 06/27/20 1011 .  diclofenac Sodium (VOLTAREN) 1 % topical gel 2 g, 2 g, Topical, BID PRN, Shahmehdi, Seyed A, MD .  diltiazem (CARDIZEM CD) 24 hr capsule 360 mg, 360 mg, Oral, Daily, Judd Gaudier V, MD, 360 mg at 06/27/20 1011 .  escitalopram (LEXAPRO) tablet 10 mg, 10 mg, Oral, Daily, Judd Gaudier V, MD, 10 mg at 06/27/20 1012 .  fluticasone (FLONASE) 50 MCG/ACT nasal spray 2 spray, 2 spray, Each Nare, Daily, Judd Gaudier V, MD, 2 spray at 06/27/20 1012 .  furosemide (LASIX) injection 20 mg, 20 mg, Intravenous, Q12H, Ottie Glazier, MD, 20 mg at 06/27/20 0526 .  insulin aspart (novoLOG) injection 0-9 Units, 0-9 Units, Subcutaneous, TID WC, Shahmehdi, Seyed A, MD, 1 Units at 06/27/20 1010 .  levalbuterol (XOPENEX) nebulizer solution 0.63 mg, 0.63 mg, Nebulization, Q6H PRN, Lang Snow, NP, 0.63 mg at 06/27/20 0845 .  magic mouthwash, 15 mL, Oral, TID, Lorella Nimrod, MD .  methylPREDNISolone sodium succinate (SOLU-MEDROL) 40 mg/mL injection 20 mg, 20 mg, Intravenous, Q24H, Lanney Gins, Margarett Viti, MD, 20 mg at 06/26/20 1720 .  metoprolol succinate (TOPROL-XL) 24 hr tablet 25 mg, 25 mg, Oral, Daily, Brek Reece, MD, 25 mg at 06/27/20 1011 .  mometasone-formoterol (DULERA) 200-5 MCG/ACT inhaler 2 puff, 2 puff, Inhalation, BID, Athena Masse, MD, 2 puff at 06/27/20 1012 .  multivitamin with minerals tablet 1 tablet, 1 tablet, Oral, Daily, Athena Masse, MD, 1 tablet at 06/27/20 1011 .  nitroGLYCERIN (NITROSTAT) SL tablet 0.4 mg, 0.4 mg, Sublingual, Q5 min PRN, Judd Gaudier V, MD .  ondansetron (ZOFRAN) tablet 4 mg, 4 mg, Oral, Q6H PRN **OR** ondansetron (ZOFRAN) injection 4 mg, 4 mg, Intravenous, Q6H PRN, Athena Masse, MD .   pantoprazole (PROTONIX) EC tablet 40 mg, 40 mg, Oral, Daily, Judd Gaudier V, MD, 40 mg at 06/27/20 1011 .  potassium chloride 20 MEQ/15ML (10%) solution 20 mEq, 20 mEq, Oral, Daily, Sebastian Lurz, MD, 20 mEq at 06/27/20 1011 .  psyllium (HYDROCIL/METAMUCIL) 1 packet, 1 packet, Oral, Daily PRN, Judd Gaudier V, MD .  traMADol (ULTRAM) tablet 50 mg, 50 mg, Oral, Q6H PRN, Shahmehdi, Seyed A, MD, 50 mg at 06/24/20 1459 .  traZODone (DESYREL) tablet 50 mg, 50 mg, Oral, QHS PRN, Shahmehdi, Seyed A, MD, 50 mg at 06/26/20 2204    ALLERGIES   Simvastatin     REVIEW OF SYSTEMS    Review of Systems:  Gen:  Denies  fever, sweats, chills weigh loss  HEENT: Denies blurred vision, double vision, ear pain, eye pain, hearing loss, nose bleeds, sore throat Cardiac:  No dizziness, chest pain or heaviness, chest tightness,edema Resp:  Denies cough or sputum porduction, shortness of breath,wheezing, hemoptysis,  Gi: Denies swallowing difficulty, stomach pain, nausea or vomiting, diarrhea, constipation, bowel incontinence Gu:  Denies bladder incontinence, burning urine Ext:   Denies Joint pain, stiffness or swelling Skin: Denies  skin rash, easy bruising or bleeding or hives Endoc:  Denies polyuria, polydipsia , polyphagia or weight change Psych:   Denies depression, insomnia or hallucinations   Other:  All other systems negative   VS: BP 122/80 (BP Location: Left Arm)   Pulse 65   Temp 97.6 F (36.4 C) (Oral)   Resp 20   Ht 5\' 6"  (1.676 m)   Wt 55.1 kg   SpO2 97%   BMI 19.61 kg/m      PHYSICAL EXAM    GENERAL:NAD, no fevers, chills, no weakness no fatigue HEAD: Normocephalic, atraumatic.  EYES: Pupils equal, round, reactive to light. Extraocular muscles intact. No scleral icterus.  MOUTH: Moist mucosal membrane. Dentition intact. No abscess noted.  EAR, NOSE, THROAT: Clear without exudates. No external lesions.  NECK: Supple. No thyromegaly. No nodules. No JVD.  PULMONARY: mild  ronchi bilaterally  CARDIOVASCULAR: S1 and S2. Regular rate and rhythm. No murmurs, rubs, or gallops. No edema. Pedal pulses 2+ bilaterally.  GASTROINTESTINAL: Soft, nontender, nondistended. No masses. Positive bowel sounds. No hepatosplenomegaly.  MUSCULOSKELETAL: No swelling, clubbing, or edema. Range of motion full in all extremities.  NEUROLOGIC: Cranial nerves II through XII are intact. No gross focal neurological deficits. Sensation intact. Reflexes intact.  SKIN: No ulceration, lesions, rashes, or cyanosis. Skin warm and dry. Turgor intact.  PSYCHIATRIC: Mood, affect within normal limits. The patient is awake, alert and oriented x 3. Insight, judgment intact.       IMAGING    DG Chest 1 View  Result Date: 06/25/2020 CLINICAL DATA:  Shortness of breath EXAM: CHEST  1 VIEW COMPARISON:  June 24, 2020 FINDINGS: The heart size and mediastinal contours are unchanged with mild cardiomegaly. Aortic knob calcifications are seen. Again noted is hyperinflation of the lung zones with flattening of the hemidiaphragms. There is mildly increased interstitial opacities seen at both lower lungs. There is new hazy airspace opacity seen at the left lung base. No acute osseous abnormality. IMPRESSION: Increased hazy airspace opacity at the left lung base which could be due to atelectasis and/or infectious etiology. Probable chronic interstitial opacities at both lungs. Electronically Signed   By: Prudencio Pair M.D.   On: 06/25/2020 03:47   DG Chest 2 View  Result Date: 06/20/2020 CLINICAL DATA:  Suspect sepsis. EXAM: CHEST - 2 VIEW COMPARISON:  03/25/2019 FINDINGS: Heart size and vascularity normal. Mild bibasilar atelectasis. Small right effusion. Elevated left hemidiaphragm with bowel gas below the left diaphragm. Mild hyperinflation lungs. No mass lesion. No acute skeletal abnormality. IMPRESSION: Mild bibasilar atelectasis and small right effusion.   COPD. Electronically Signed   By: Franchot Gallo M.D.    On: 05/28/2020 13:35   CT Head Wo Contrast  Result Date: 06/07/2020 CLINICAL DATA:  Altered mental status. EXAM: CT HEAD WITHOUT CONTRAST TECHNIQUE: Contiguous axial images were obtained from the base of the skull through the vertex without intravenous contrast. COMPARISON:  March 16, 2018 FINDINGS: Brain: There is mild cerebral atrophy with widening of the extra-axial spaces and ventricular dilatation. There are areas of decreased attenuation within the white matter tracts of the supratentorial brain, consistent with microvascular disease changes. Vascular: No hyperdense vessel or unexpected calcification. Skull: Normal. Negative for fracture or focal lesion. Sinuses/Orbits: Very mild, bilateral  ethmoid sinus mucosal thickening is seen. Other: None. IMPRESSION: 1. Generalized cerebral atrophy. 2. Very mild, bilateral ethmoid sinus disease. 3. No acute intracranial abnormality. Electronically Signed   By: Virgina Norfolk M.D.   On: 06/11/2020 21:11   MR PELVIS WO CONTRAST  Result Date: 06/24/2020 CLINICAL DATA:  Possible rectal mass on CT EXAM: MRI PELVIS WITHOUT CONTRAST TECHNIQUE: Multiplanar multisequence MR imaging of the pelvis was performed. No intravenous contrast was administered. Small amount of [place] gel was administered per rectum to optimize tumor evaluation. COMPARISON:  CT abdomen/pelvis dated 06/16/2020 FINDINGS: Urinary Tract:  Bladder is within normal limits. Bowel: Distension of the rectum with ultrasound gel. No rectal mass is seen. Visualized bowel is otherwise unremarkable, including the sigmoid colon. Vascular/Lymphatic: No evidence of aneurysm. No suspicious pelvic lymphadenopathy. Reproductive:  Suspected postsurgical changes related to prior TURP. Other:  No pelvic ascites. Musculoskeletal: Mild degenerative changes of the lower lumbar spine. IMPRESSION: No rectosigmoid mass is evident on MR. Negative pelvic MRI. Electronically Signed   By: Julian Hy M.D.   On: 06/24/2020  12:15   MR ABDOMEN W WO CONTRAST  Result Date: 06/24/2020 CLINICAL DATA:  Inpatient. Frequent UTI. COPD and pulmonary fibrosis with chronic respiratory failure. Confusion. Concern for rectal mass on recent CT. EXAM: MRI ABDOMEN WITHOUT AND WITH CONTRAST TECHNIQUE: Multiplanar multisequence MR imaging of the abdomen was performed both before and after the administration of intravenous contrast. CONTRAST:  7.55mL GADAVIST GADOBUTROL 1 MMOL/ML IV SOLN COMPARISON:  05/27/2020 CT abdomen/pelvis. FINDINGS: Lower chest: Small dependent bilateral pleural effusions, right greater than left. Hepatobiliary: Normal liver size and configuration. No hepatic steatosis. No liver mass. Cholecystectomy. Bile ducts are within normal post cholecystectomy limits. Common bile duct diameter 5 mm. No choledocholithiasis. No biliary masses, strictures or beading. Probable 12 mm diameter cystic duct remnant in the porta hepatis (series 4/image 14). Pancreas: No pancreatic mass or duct dilation.  No pancreas divisum. Spleen: Normal size. No mass. Adrenals/Urinary Tract: Normal adrenals. No hydronephrosis. Scattered simple subcentimeter renal cortical cysts in both kidneys. No suspicious renal masses. Stomach/Bowel: Normal non-distended stomach. Visualized small and large bowel is normal caliber, with no bowel wall thickening. Vascular/Lymphatic: Atherosclerotic nonaneurysmal abdominal aorta. Patent portal, splenic, hepatic and renal veins. Retroaortic left renal vein. No pathologically enlarged lymph nodes in the abdomen. Other: No abdominal ascites or focal fluid collection. Musculoskeletal: No aggressive appearing focal osseous lesions. Mild L4 vertebral compression fracture, chronic appearing. IMPRESSION: 1. No lymphadenopathy or other findings to suggest metastatic disease in the abdomen. MRI pelvis to be reported separately. 2. Bile ducts are within normal post cholecystectomy limits (CBD diameter 5 mm). No choledocholithiasis.  Probable small cystic duct remnant in the porta hepatis. 3. Small dependent bilateral pleural effusions, right greater than left. 4. Mild L4 vertebral compression fracture, chronic appearing. Electronically Signed   By: Ilona Sorrel M.D.   On: 06/24/2020 07:01   CT ABDOMEN PELVIS W CONTRAST  Result Date: 05/26/2020 CLINICAL DATA:  Abdominal pain. EXAM: CT ABDOMEN AND PELVIS WITH CONTRAST TECHNIQUE: Multidetector CT imaging of the abdomen and pelvis was performed using the standard protocol following bolus administration of intravenous contrast. CONTRAST:  112mL OMNIPAQUE IOHEXOL 300 MG/ML  SOLN COMPARISON:  None. FINDINGS: Lower chest: A trace amount of atelectasis is seen within the right lung base. A very small right pleural effusion is noted. Hepatobiliary: No focal liver abnormality is seen. Status post cholecystectomy. No biliary dilatation. Pancreas: Unremarkable. No pancreatic ductal dilatation or surrounding inflammatory changes. Spleen: Normal in size  without focal abnormality. Adrenals/Urinary Tract: Adrenal glands are unremarkable. Kidneys are normal, without renal calculi, focal lesion, or hydronephrosis. Bladder is unremarkable. Stomach/Bowel: Stomach is within normal limits. Appendix appears normal. No evidence of bowel dilatation. There is marked severity thickening of the rectum and adjacent portion of the distal sigmoid colon (axial CT images 71 through 75, CT series number 2). This is slightly asymmetric in appearance, right greater than left. No associated inflammatory fat stranding is seen. Vascular/Lymphatic: There is marked severity calcification and atherosclerosis of the abdominal aorta and bilateral common iliac arteries, without evidence of aneurysmal dilatation. No enlarged abdominal or pelvic lymph nodes. Reproductive: The prostate gland is mildly enlarged. Other: No abdominal wall hernia or abnormality. No abdominopelvic ascites. Musculoskeletal: Moderate to marked severity  multilevel degenerative changes seen throughout the lumbar spine. IMPRESSION: 1. Asymmetric of the rectum and adjacent portion of the sigmoid colon, which may be secondary to an underlying mass. Correlation with physical examination and MRI is recommended. 2. Evidence of prior cholecystectomy. 3. Very small right pleural effusion. 4. Mildly enlarged prostate gland. 5. Moderate to marked severity multilevel degenerative changes throughout the lumbar spine. 6. Aortic atherosclerosis. Aortic Atherosclerosis (ICD10-I70.0). Electronically Signed   By: Virgina Norfolk M.D.   On: 06/14/2020 21:22   DG Chest Port 1 View  Result Date: 06/27/2020 CLINICAL DATA:  Altered mental status EXAM: PORTABLE CHEST 1 VIEW COMPARISON:  06/25/2020 FINDINGS: Chronic interstitial prominence. Improved aeration at the left lung base. No pleural effusion or pneumothorax. Stable cardiomediastinal contours. IMPRESSION: Improved aeration at the left lung base since 06/25/2020. No new findings. Electronically Signed   By: Macy Mis M.D.   On: 06/27/2020 08:07   DG Chest Port 1 View  Result Date: 06/24/2020 CLINICAL DATA:  Shortness of breath, pulmonary fibrosis, altered mental status EXAM: PORTABLE CHEST 1 VIEW COMPARISON:  06/19/2020 FINDINGS: Mild cardiomegaly. Emphysema. Mild, diffuse interstitial opacity. Bibasilar fibrotic scarring. The visualized skeletal structures are unremarkable. IMPRESSION: Emphysema and bibasilar fibrotic change with mild, diffuse interstitial opacity, potentially edema. No focal airspace opacity. Electronically Signed   By: Eddie Candle M.D.   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    Patient/Family are satisfied with care plan and all questions have been answered.  This document was prepared using Dragon voice recognition software and may include unintentional dictation errors.     Ottie Glazier, M.D.  Division of [facility]

## 2020-06-28 DIAGNOSIS — J9611 Chronic respiratory failure with hypoxia: Secondary | ICD-10-CM | POA: Diagnosis not present

## 2020-06-28 LAB — CBC
HCT: 44.9 % (ref 39.0–52.0)
Hemoglobin: 14.7 g/dL (ref 13.0–17.0)
MCH: 29.7 pg (ref 26.0–34.0)
MCHC: 32.7 g/dL (ref 30.0–36.0)
MCV: 90.7 fL (ref 80.0–100.0)
Platelets: 241 10*3/uL (ref 150–400)
RBC: 4.95 MIL/uL (ref 4.22–5.81)
RDW: 16.6 % — ABNORMAL HIGH (ref 11.5–15.5)
WBC: 16.8 10*3/uL — ABNORMAL HIGH (ref 4.0–10.5)
nRBC: 0 % (ref 0.0–0.2)

## 2020-06-28 LAB — BASIC METABOLIC PANEL
Anion gap: 10 (ref 5–15)
BUN: 30 mg/dL — ABNORMAL HIGH (ref 8–23)
CO2: 28 mmol/L (ref 22–32)
Calcium: 8.7 mg/dL — ABNORMAL LOW (ref 8.9–10.3)
Chloride: 98 mmol/L (ref 98–111)
Creatinine, Ser: 0.68 mg/dL (ref 0.61–1.24)
GFR calc non Af Amer: >60 mL/min (ref >60)
Glucose, Bld: 174 mg/dL — ABNORMAL HIGH (ref 70–99)
Potassium: 3.8 mmol/L (ref 3.5–5.1)
Sodium: 136 mmol/L (ref 135–145)

## 2020-06-28 LAB — GLUCOSE, CAPILLARY
Glucose-Capillary: 167 mg/dL — ABNORMAL HIGH (ref 70–99)
Glucose-Capillary: 174 mg/dL — ABNORMAL HIGH (ref 70–99)
Glucose-Capillary: 133 mg/dL — ABNORMAL HIGH (ref 70–99)
Glucose-Capillary: 173 mg/dL — ABNORMAL HIGH (ref 70–99)

## 2020-06-28 MED ORDER — PREDNISONE 20 MG PO TABS
40.0000 mg | ORAL_TABLET | Freq: Every day | ORAL | Status: DC
  Administered 2020-06-29 – 2020-07-01 (×3): 40 mg via ORAL
  Filled 2020-06-28 (×3): qty 2

## 2020-06-28 MED ORDER — ENSURE ENLIVE PO LIQD
237.0000 mL | Freq: Two times a day (BID) | ORAL | Status: DC
  Administered 2020-06-28 – 2020-07-08 (×12): 237 mL via ORAL

## 2020-06-28 NOTE — Progress Notes (Signed)
Pulmonary Medicine          Date: 06/28/2020,   MRN# [id number] Eric Li 25-Jan-1935     AdmissionWeight: 53.5 kg                 CurrentWeight: 53.2 kg   Referring physician: Dr Roger Shelter   CHIEF COMPLAINT:   Acute hypoxemic respiratory failure   HISTORY OF PRESENT ILLNESS   As per admission h/p Eric Li is a 85 y.o. male with medical history significant for paroxysmal A. fib on apixaban, HTN, CAD, COPD and pulmonary fibrosis with chronic respiratory failure on home O2 at 3 L, who was brought to the emergency room by EMS because of confusion. Family called with concerns for patient waking up disoriented believing he was somewhere else and appeared to be not acting himself and " talking out of his head". Wife also voiced concern for possible UTI as his urination was frequent and with strong odor. He has had no fever or chills, or chest pain or shortness of breath. Has had no nausea, vomiting or change in bowel habits. He is fully vaccinated against Covid. On arrival, his vitals were within normal limits without fever or tachycardia and with O2 sat 97% on O2 at home flow rate . Blood work was significant for WBC of 11,700 and lactic acid of 2>>1.6. Urinalysis showed pyuria. Other blood work mostly unremarkable.  EKG  NSR with RBBB with nonspecific ST-T wave changes  Chest x-ray showed no acute findings, head CT no acute findings. Due to patient complaint of abdominal pain CT abdomen and pelvis with contrast was done which showed asymmetry of the rectum and adjacent portion of the sigmoid colon. Pulmonary consultation for worsening dyspnea with acute on chronic hypoxemic respiratory failure.   06/25/20- patient evaluated at bedside this afternoon.  He had repeat CXR overnight with worsening LLL infiltrate.  Family member at bedside (daughter) shares that he had desatutation event after eating. This suggests aspiration. He made adequate urine with lasix with notable  improvement after diuresis as per family.  MetNEB was unable to be used due to patient being sleepy unable to participate.    06/26/20-  Patient with mild imrpovement.  Noted Palliative evaluation appreciate input. Will institute OT/PT starting tommorow.  Contiue gentle diuresis and MetaNEB therapy as well as current antimicrobials.   06/27/20- Patient seen and examined this morning he seems confused.  Granddaughter at bedside during my examination states he has been having similar symptoms for past 1/2 year and thoughts were revolving around dementia workup. This seems to be episode of sundowning. Patient was significantly more oriented and lucid yesterday mid-day.  He had CXR today with good improvement of previous infiltrates.   06/28/20- patient is down to 45% 45L/min in HFNC which is significantly better then previous. He is resting in bed comfortably. Family at bedside during my evaluation.   PAST MEDICAL HISTORY   Past Medical History:  Diagnosis Date  . Bilateral hearing loss 03/03/2016  . CAD (coronary artery disease) 02/24/2014  . CHF (congestive heart failure) ([place])   . COPD, moderate ([place]) 08/29/2015  . Hemorrhoids 02/24/2014  . HTN (hypertension) 02/24/2014  . Hyperlipidemia, unspecified 02/24/2014  . Nephrolithiasis   . SOB (shortness of breath) on exertion 03/27/2015  . Statin intolerance 03/30/2015     SURGICAL HISTORY   Past Surgical History:  Procedure Laterality Date  . CHOLECYSTECTOMY    . TONSILLECTOMY       FAMILY HISTORY  Family History  Problem Relation Age of Onset  . Bladder Cancer Neg Hx   . Prolactinoma Neg Hx   . Prostate cancer Neg Hx   . Kidney cancer Neg Hx      SOCIAL HISTORY   Social History   Tobacco Use  . Smoking status: Former Research scientist (life sciences)  . Smokeless tobacco: Never Used  Vaping Use  . Vaping Use: Never used  Substance Use Topics  . Alcohol use: No  . Drug use: No     MEDICATIONS    Home Medication:    Current Medication:  Current  Facility-Administered Medications:  .  acetaminophen (TYLENOL) tablet 650 mg, 650 mg, Oral, Q6H PRN, 650 mg at 06/24/20 1711 **OR** acetaminophen (TYLENOL) suppository 650 mg, 650 mg, Rectal, Q6H PRN, Judd Gaudier V, MD .  acidophilus (RISAQUAD) capsule 1 capsule, 1 capsule, Oral, Daily, Shahmehdi, Seyed A, MD, 1 capsule at 06/28/20 1009 .  apixaban (ELIQUIS) tablet 2.5 mg, 2.5 mg, Oral, BID, Judd Gaudier V, MD, 2.5 mg at 06/28/20 1009 .  diclofenac Sodium (VOLTAREN) 1 % topical gel 2 g, 2 g, Topical, BID PRN, Shahmehdi, Seyed A, MD .  diltiazem (CARDIZEM CD) 24 hr capsule 300 mg, 300 mg, Oral, Daily, Lorella Nimrod, MD, 300 mg at 06/28/20 1009 .  escitalopram (LEXAPRO) tablet 10 mg, 10 mg, Oral, Daily, Judd Gaudier V, MD, 10 mg at 06/28/20 1010 .  feeding supplement (ENSURE ENLIVE) (ENSURE ENLIVE) liquid 237 mL, 237 mL, Oral, BID BM, Lorella Nimrod, MD, 237 mL at 06/28/20 1256 .  fluticasone (FLONASE) 50 MCG/ACT nasal spray 2 spray, 2 spray, Each Nare, Daily, Judd Gaudier V, MD, 2 spray at 06/28/20 1014 .  furosemide (LASIX) injection 20 mg, 20 mg, Intravenous, Q12H, Ottie Glazier, MD, 20 mg at 06/28/20 0615 .  insulin aspart (novoLOG) injection 0-9 Units, 0-9 Units, Subcutaneous, TID WC, Shahmehdi, Seyed A, MD, 2 Units at 06/28/20 1254 .  levalbuterol (XOPENEX) nebulizer solution 0.63 mg, 0.63 mg, Nebulization, Q6H PRN, Lang Snow, NP, 0.63 mg at 06/27/20 0845 .  magic mouthwash, 15 mL, Oral, TID, Lorella Nimrod, MD, 15 mL at 06/28/20 1010 .  methylPREDNISolone sodium succinate (SOLU-MEDROL) 40 mg/mL injection 20 mg, 20 mg, Intravenous, Q24H, Adelaide Pfefferkorn, MD, 20 mg at 06/27/20 1705 .  metoprolol succinate (TOPROL-XL) 24 hr tablet 25 mg, 25 mg, Oral, Daily, Gianmarco Roye, MD, 25 mg at 06/28/20 1009 .  mometasone-formoterol (DULERA) 200-5 MCG/ACT inhaler 2 puff, 2 puff, Inhalation, BID, Athena Masse, MD, 2 puff at 06/28/20 1013 .  multivitamin with minerals tablet 1 tablet, 1  tablet, Oral, Daily, Athena Masse, MD, 1 tablet at 06/28/20 1008 .  nitroGLYCERIN (NITROSTAT) SL tablet 0.4 mg, 0.4 mg, Sublingual, Q5 min PRN, Judd Gaudier V, MD .  ondansetron (ZOFRAN) tablet 4 mg, 4 mg, Oral, Q6H PRN **OR** ondansetron (ZOFRAN) injection 4 mg, 4 mg, Intravenous, Q6H PRN, Athena Masse, MD .  pantoprazole (PROTONIX) EC tablet 40 mg, 40 mg, Oral, Daily, Judd Gaudier V, MD, 40 mg at 06/28/20 1008 .  potassium chloride 20 MEQ/15ML (10%) solution 20 mEq, 20 mEq, Oral, Daily, Luticia Tadros, MD, 20 mEq at 06/28/20 1013 .  psyllium (HYDROCIL/METAMUCIL) 1 packet, 1 packet, Oral, Daily PRN, Athena Masse, MD .  traMADol Veatrice Bourbon) tablet 50 mg, 50 mg, Oral, Q6H PRN, Shahmehdi, Seyed A, MD, 50 mg at 06/27/20 2043 .  traZODone (DESYREL) tablet 50 mg, 50 mg, Oral, QHS PRN, Shahmehdi, Seyed A, MD, 50 mg at 06/27/20 2043  ALLERGIES   Simvastatin     REVIEW OF SYSTEMS    Review of Systems:  Gen:  Denies  fever, sweats, chills weigh loss  HEENT: Denies blurred vision, double vision, ear pain, eye pain, hearing loss, nose bleeds, sore throat Cardiac:  No dizziness, chest pain or heaviness, chest tightness,edema Resp:   Denies cough or sputum porduction, shortness of breath,wheezing, hemoptysis,  Gi: Denies swallowing difficulty, stomach pain, nausea or vomiting, diarrhea, constipation, bowel incontinence Gu:  Denies bladder incontinence, burning urine Ext:   Denies Joint pain, stiffness or swelling Skin: Denies  skin rash, easy bruising or bleeding or hives Endoc:  Denies polyuria, polydipsia , polyphagia or weight change Psych:   Denies depression, insomnia or hallucinations   Other:  All other systems negative   VS: BP 117/68 (BP Location: Left Arm)   Pulse 63   Temp (!) 97.5 F (36.4 C) (Oral)   Resp 17   Ht 5\' 6"  (1.676 m)   Wt 53.2 kg   SpO2 96%   BMI 18.93 kg/m      PHYSICAL EXAM    GENERAL:NAD, no fevers, chills, no weakness no fatigue HEAD:  Normocephalic, atraumatic.  EYES: Pupils equal, round, reactive to light. Extraocular muscles intact. No scleral icterus.  MOUTH: Moist mucosal membrane. Dentition intact. No abscess noted.  EAR, NOSE, THROAT: Clear without exudates. No external lesions.  NECK: Supple. No thyromegaly. No nodules. No JVD.  PULMONARY: mild ronchi bilaterally  CARDIOVASCULAR: S1 and S2. Regular rate and rhythm. No murmurs, rubs, or gallops. No edema. Pedal pulses 2+ bilaterally.  GASTROINTESTINAL: Soft, nontender, nondistended. No masses. Positive bowel sounds. No hepatosplenomegaly.  MUSCULOSKELETAL: No swelling, clubbing, or edema. Range of motion full in all extremities.  NEUROLOGIC: Cranial nerves II through XII are intact. No gross focal neurological deficits. Sensation intact. Reflexes intact.  SKIN: No ulceration, lesions, rashes, or cyanosis. Skin warm and dry. Turgor intact.  PSYCHIATRIC: Mood, affect within normal limits. The patient is awake, alert and oriented x 3. Insight, judgment intact.       IMAGING    DG Chest 1 View  Result Date: 06/25/2020 CLINICAL DATA:  Shortness of breath EXAM: CHEST  1 VIEW COMPARISON:  June 24, 2020 FINDINGS: The heart size and mediastinal contours are unchanged with mild cardiomegaly. Aortic knob calcifications are seen. Again noted is hyperinflation of the lung zones with flattening of the hemidiaphragms. There is mildly increased interstitial opacities seen at both lower lungs. There is new hazy airspace opacity seen at the left lung base. No acute osseous abnormality. IMPRESSION: Increased hazy airspace opacity at the left lung base which could be due to atelectasis and/or infectious etiology. Probable chronic interstitial opacities at both lungs. Electronically Signed   By: Prudencio Pair M.D.   On: 06/25/2020 03:47   DG Chest 2 View  Result Date: 06/05/2020 CLINICAL DATA:  Suspect sepsis. EXAM: CHEST - 2 VIEW COMPARISON:  03/25/2019 FINDINGS: Heart size and  vascularity normal. Mild bibasilar atelectasis. Small right effusion. Elevated left hemidiaphragm with bowel gas below the left diaphragm. Mild hyperinflation lungs. No mass lesion. No acute skeletal abnormality. IMPRESSION: Mild bibasilar atelectasis and small right effusion.   COPD. Electronically Signed   By: Franchot Gallo M.D.   On: 06/19/2020 13:35   CT Head Wo Contrast  Result Date: 06/17/2020 CLINICAL DATA:  Altered mental status. EXAM: CT HEAD WITHOUT CONTRAST TECHNIQUE: Contiguous axial images were obtained from the base of the skull through the vertex without intravenous contrast. COMPARISON:  March 16, 2018 FINDINGS: Brain: There is mild cerebral atrophy with widening of the extra-axial spaces and ventricular dilatation. There are areas of decreased attenuation within the white matter tracts of the supratentorial brain, consistent with microvascular disease changes. Vascular: No hyperdense vessel or unexpected calcification. Skull: Normal. Negative for fracture or focal lesion. Sinuses/Orbits: Very mild, bilateral ethmoid sinus mucosal thickening is seen. Other: None. IMPRESSION: 1. Generalized cerebral atrophy. 2. Very mild, bilateral ethmoid sinus disease. 3. No acute intracranial abnormality. Electronically Signed   By: Virgina Norfolk M.D.   On: 06/11/2020 21:11   MR PELVIS WO CONTRAST  Result Date: 06/24/2020 CLINICAL DATA:  Possible rectal mass on CT EXAM: MRI PELVIS WITHOUT CONTRAST TECHNIQUE: Multiplanar multisequence MR imaging of the pelvis was performed. No intravenous contrast was administered. Small amount of [place] gel was administered per rectum to optimize tumor evaluation. COMPARISON:  CT abdomen/pelvis dated 05/29/2020 FINDINGS: Urinary Tract:  Bladder is within normal limits. Bowel: Distension of the rectum with ultrasound gel. No rectal mass is seen. Visualized bowel is otherwise unremarkable, including the sigmoid colon. Vascular/Lymphatic: No evidence of aneurysm. No suspicious  pelvic lymphadenopathy. Reproductive:  Suspected postsurgical changes related to prior TURP. Other:  No pelvic ascites. Musculoskeletal: Mild degenerative changes of the lower lumbar spine. IMPRESSION: No rectosigmoid mass is evident on MR. Negative pelvic MRI. Electronically Signed   By: Julian Hy M.D.   On: 06/24/2020 12:15   MR ABDOMEN W WO CONTRAST  Result Date: 06/24/2020 CLINICAL DATA:  Inpatient. Frequent UTI. COPD and pulmonary fibrosis with chronic respiratory failure. Confusion. Concern for rectal mass on recent CT. EXAM: MRI ABDOMEN WITHOUT AND WITH CONTRAST TECHNIQUE: Multiplanar multisequence MR imaging of the abdomen was performed both before and after the administration of intravenous contrast. CONTRAST:  7.6mL GADAVIST GADOBUTROL 1 MMOL/ML IV SOLN COMPARISON:  06/17/2020 CT abdomen/pelvis. FINDINGS: Lower chest: Small dependent bilateral pleural effusions, right greater than left. Hepatobiliary: Normal liver size and configuration. No hepatic steatosis. No liver mass. Cholecystectomy. Bile ducts are within normal post cholecystectomy limits. Common bile duct diameter 5 mm. No choledocholithiasis. No biliary masses, strictures or beading. Probable 12 mm diameter cystic duct remnant in the porta hepatis (series 4/image 14). Pancreas: No pancreatic mass or duct dilation.  No pancreas divisum. Spleen: Normal size. No mass. Adrenals/Urinary Tract: Normal adrenals. No hydronephrosis. Scattered simple subcentimeter renal cortical cysts in both kidneys. No suspicious renal masses. Stomach/Bowel: Normal non-distended stomach. Visualized small and large bowel is normal caliber, with no bowel wall thickening. Vascular/Lymphatic: Atherosclerotic nonaneurysmal abdominal aorta. Patent portal, splenic, hepatic and renal veins. Retroaortic left renal vein. No pathologically enlarged lymph nodes in the abdomen. Other: No abdominal ascites or focal fluid collection. Musculoskeletal: No aggressive appearing  focal osseous lesions. Mild L4 vertebral compression fracture, chronic appearing. IMPRESSION: 1. No lymphadenopathy or other findings to suggest metastatic disease in the abdomen. MRI pelvis to be reported separately. 2. Bile ducts are within normal post cholecystectomy limits (CBD diameter 5 mm). No choledocholithiasis. Probable small cystic duct remnant in the porta hepatis. 3. Small dependent bilateral pleural effusions, right greater than left. 4. Mild L4 vertebral compression fracture, chronic appearing. Electronically Signed   By: Ilona Sorrel M.D.   On: 06/24/2020 07:01   CT ABDOMEN PELVIS W CONTRAST  Result Date: 05/26/2020 CLINICAL DATA:  Abdominal pain. EXAM: CT ABDOMEN AND PELVIS WITH CONTRAST TECHNIQUE: Multidetector CT imaging of the abdomen and pelvis was performed using the standard protocol following bolus administration of intravenous contrast. CONTRAST:  168mL OMNIPAQUE  IOHEXOL 300 MG/ML  SOLN COMPARISON:  None. FINDINGS: Lower chest: A trace amount of atelectasis is seen within the right lung base. A very small right pleural effusion is noted. Hepatobiliary: No focal liver abnormality is seen. Status post cholecystectomy. No biliary dilatation. Pancreas: Unremarkable. No pancreatic ductal dilatation or surrounding inflammatory changes. Spleen: Normal in size without focal abnormality. Adrenals/Urinary Tract: Adrenal glands are unremarkable. Kidneys are normal, without renal calculi, focal lesion, or hydronephrosis. Bladder is unremarkable. Stomach/Bowel: Stomach is within normal limits. Appendix appears normal. No evidence of bowel dilatation. There is marked severity thickening of the rectum and adjacent portion of the distal sigmoid colon (axial CT images 71 through 75, CT series number 2). This is slightly asymmetric in appearance, right greater than left. No associated inflammatory fat stranding is seen. Vascular/Lymphatic: There is marked severity calcification and atherosclerosis of the  abdominal aorta and bilateral common iliac arteries, without evidence of aneurysmal dilatation. No enlarged abdominal or pelvic lymph nodes. Reproductive: The prostate gland is mildly enlarged. Other: No abdominal wall hernia or abnormality. No abdominopelvic ascites. Musculoskeletal: Moderate to marked severity multilevel degenerative changes seen throughout the lumbar spine. IMPRESSION: 1. Asymmetric of the rectum and adjacent portion of the sigmoid colon, which may be secondary to an underlying mass. Correlation with physical examination and MRI is recommended. 2. Evidence of prior cholecystectomy. 3. Very small right pleural effusion. 4. Mildly enlarged prostate gland. 5. Moderate to marked severity multilevel degenerative changes throughout the lumbar spine. 6. Aortic atherosclerosis. Aortic Atherosclerosis (ICD10-I70.0). Electronically Signed   By: Virgina Norfolk M.D.   On: 05/31/2020 21:22   DG Chest Port 1 View  Result Date: 06/27/2020 CLINICAL DATA:  Altered mental status EXAM: PORTABLE CHEST 1 VIEW COMPARISON:  06/25/2020 FINDINGS: Chronic interstitial prominence. Improved aeration at the left lung base. No pleural effusion or pneumothorax. Stable cardiomediastinal contours. IMPRESSION: Improved aeration at the left lung base since 06/25/2020. No new findings. Electronically Signed   By: Macy Mis M.D.   On: 06/27/2020 08:07   DG Chest Port 1 View  Result Date: 06/24/2020 CLINICAL DATA:  Shortness of breath, pulmonary fibrosis, altered mental status EXAM: PORTABLE CHEST 1 VIEW COMPARISON:  05/30/2020 FINDINGS: Mild cardiomegaly. Emphysema. Mild, diffuse interstitial opacity. Bibasilar fibrotic scarring. The visualized skeletal structures are unremarkable. IMPRESSION: Emphysema and bibasilar fibrotic change with mild, diffuse interstitial opacity, potentially edema. No focal airspace opacity. Electronically Signed   By: Eddie Candle M.D.   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    Patient/Family are satisfied with care plan and all questions have been answered.  This document was prepared using Dragon voice recognition software and may include unintentional dictation errors.     Ottie Glazier, M.D.  Division of [facility]

## 2020-06-28 NOTE — TOC Progression Note (Signed)
Transition of Care [facility]) - Progression Note    Patient Details  Name: Eric Li MRN: [id number] Date of Birth: 07-Aug-1935  Transition of Care [facility]) CM/SW [place], RN Phone Number: 06/28/2020, 4:05 PM  Clinical Narrative:     Patient continues to need medical treatment, TOC will continue to monitor for discharge planning needs.       Expected Discharge Plan and Services                                                 Social Determinants of Health (SDOH) Interventions    Readmission Risk Interventions No flowsheet data found.

## 2020-06-28 NOTE — Progress Notes (Signed)
PROGRESS NOTE    Eric Li  YIA:[id number] DOB: 05/20/1935 DOA: 06/09/2020 PCP: [name], MD   Brief Narrative: Taken from prior notes. Eric Li a 85 year old male with history of paroxysmal A. fib on apixaban, HTN, CAD, frequent UTIs, COPD and pulmonary fibrosis with chronic respiratory failure on home O2 at 3 L presenting with altered mental status secondary to UTI.  Completed treatment with ceftriaxone. Developed acute on chronic hypoxic respiratory failure secondary to exacerbation of his pulmonary fibrosis requiring heated HFNC. Pulmonary was consulted.  Subjective: Patient has no new complaint when seen today.  Daughter was at bedside.  Continue to require higher level of oxygen with heated HFNC.  Assessment & Plan:   Principal Problem:   Acute metabolic encephalopathy Active Problems:   CAD (coronary artery disease)   HTN (hypertension)   UTI (urinary tract infection)   PAF (paroxysmal atrial fibrillation) (HCC)   Pulmonary fibrosis (HCC)   Chronic respiratory failure with hypoxia (HCC)   COPD, severe (HCC)   Abnormal computed tomography angiography (CTA) of abdomen and pelvis   Goals of care, counseling/discussion   Palliative care by specialist   DNR (do not resuscitate)  Acute on chronic respiratory failure with hypoxia.  Most likely secondary to pulmonary fibrosis exacerbation.  Pulmonary was consulted and they started him on steroid.  Chest x-ray done on 06/25/2020 with left basilar opacity with concern of aspiration pneumonia.  Patient completed a course with ceftriaxone.  Subsequent chest x-ray with some improvement in that infiltrate. Remains on heated HFNC at 40 L and 40% of FiO2.  Baseline oxygen requirement of 3-4 L. -Continue with Solu-Medrol. -Continue with DuoNeb. -Continue with MetaNeb. -Continue with IV Lasix. -Try weaning if tolerates.  Sepsis secondary to UTI.  Resolved. Initially met sepsis criteria with UTI.  Urine culture  grew E. coli which were pretty pansensitive except quinolones.  Completed a course of ceftriaxone.  Encephalopathy.  Some waxing and waning in his mental status. High risk for delirium and sundowning secondary to his age and underlying dementia. He was alert and oriented to self only, able to recognize daughter and communicate with her. -Dementia precautions. -Continue to monitor. -Palliative care was also consulted-patient is DNR and will continue current scope of care.  Oral thrush.  Oral thrush was noted yesterday.  Patient is on steroid.  Seems little improving. -Continue with Magic mouthwash.  History of coronary artery disease.  No chest pain or acute EKG changes. -Continue with home dose of metoprolol, nitroglycerin. -Currently not on Zetia.  Hypertension.  Blood pressure within goal. -Continue home meds.  Paroxysmal atrial fibrillation.  Heart rate in mid 60s after decreasing the dose of Cardizem. -Continue with decreased  dose of Cardizem from 360 to 300. -Continue with low-dose metoprolol-can discontinue if needed. -Continue with Eliquis.  Incidental abnormal computed tomography angiography (CTA) of abdomen and pelvis -Followed by MRI, no mass was identified -MRI of abdomen pelvis completed on 06/23/2020 -no comments on sigmoid mass -GI consult has been canceled.   Objective: Vitals:   06/28/20 0700 06/28/20 0900 06/28/20 0930 06/28/20 1220  BP: 110/67 111/68  109/67  Pulse: (!) 41 63  64  Resp: _0 Temp: 97.7 F (36.5 C)   (!) 97.4 F (36.3 C)  TempSrc: Axillary   Oral  SpO2: 95% 94% 94% 97%  Weight:      Height:        Intake/Output Summary (Last 24 hours) at 06/28/2020 1250 Last data filed at  06/28/2020 0300 Gross per 24 hour  Intake --  Output 700 ml  Net -700 ml   Filed Weights   05/31/2020 1237 06/27/20 0507 06/28/20 0319  Weight: 53.5 kg 55.1 kg 53.2 kg    Examination:  General.  Frail elderly man, in no acute distress. Pulmonary.  Lungs  clear bilaterally, normal respiratory effort. CV.  Regular rate and rhythm, no JVD, rub or murmur. Abdomen.  Soft, nontender, nondistended, BS positive. CNS.  Alert and oriented to self only, no focal neurologic deficit. Extremities.  No edema, no cyanosis, pulses intact and symmetrical. Psychiatry.  Judgment and insight appears impaired.  DVT prophylaxis: Eliquis Code Status: DNR Family Communication: Daughter was updated at bedside. Disposition Plan:  Status is: Inpatient  Remains inpatient appropriate because:Inpatient level of care appropriate due to severity of illness   Dispo: The patient is from: Home              Anticipated d/c is to: SNF              Anticipated d/c date is: > 3 days              Patient currently is not medically stable to d/c.  Patient is high risk for deterioration and death secondary to advanced age and multiple underlying comorbidities.  Palliative care has been consulted.  Patient is now DNR.   Consultants:   Pulmonary  Procedures:  Antimicrobials:   Data Reviewed: I have personally reviewed following labs and imaging studies  CBC: Recent Labs  Lab 06/05/2020 1307 06/22/20 0440 06/25/20 0439 06/28/20 0609  WBC 11.7* 14.1* 14.5* 16.8*  NEUTROABS 8.2*  --   --   --   HGB 14.3 14.1 15.1 14.7  HCT 42.0 43.7 44.4 44.9  MCV 88.6 91.0 87.9 90.7  PLT 201 216 210 014   Basic Metabolic Panel: Recent Labs  Lab 06/24/20 0739 06/24/20 0739 06/24/20 1910 06/25/20 0439 06/26/20 0359 06/27/20 0548 06/28/20 0609  NA 138  --   --  139 135 134* 136  K 2.9*   < > 3.1* 3.8 3.8 4.1 3.8  CL 106  --   --  104 99 95* 98  CO2 22  --   --  _0 GLUCOSE 146*  --   --  231* 214* 138* 174*  BUN 19  --   --  18 28* 27* 30*  CREATININE 0.73  --   --  0.67 0.71 0.70 0.68  CALCIUM 8.1*  --   --  8.6* 8.7* 9.2 8.7*  MG  --   --   --  2.6*  --   --   --   PHOS  --   --   --   --  3.4  --   --    < > = values in this interval not displayed.    GFR: Estimated Creatinine Clearance: 52.6 mL/min (by C-G formula based on SCr of 0.68 mg/dL). Liver Function Tests: Recent Labs  Lab 05/31/2020 1307  AST 25  ALT 26  ALKPHOS 107  BILITOT 1.1  PROT 6.3*  ALBUMIN 3.1*   No results for input(s): LIPASE, AMYLASE in the last 168 hours. No results for input(s): AMMONIA in the last 168 hours. Coagulation Profile: Recent Labs  Lab 05/29/2020 1307  INR 1.3*   Cardiac Enzymes: No results for input(s): CKTOTAL, CKMB, CKMBINDEX, TROPONINI in the last 168 hours. BNP (last 3 results) No results for input(s): PROBNP in  the last 8760 hours. HbA1C: No results for input(s): HGBA1C in the last 72 hours. CBG: Recent Labs  Lab 06/27/20 1129 06/27/20 1654 06/27/20 2115 06/28/20 0816 06/28/20 1208  GLUCAP 122* 97 160* 167* 174*   Lipid Profile: No results for input(s): CHOL, HDL, LDLCALC, TRIG, CHOLHDL, LDLDIRECT in the last 72 hours. Thyroid Function Tests: No results for input(s): TSH, T4TOTAL, FREET4, T3FREE, THYROIDAB in the last 72 hours. Anemia Panel: No results for input(s): VITAMINB12, FOLATE, FERRITIN, TIBC, IRON, RETICCTPCT in the last 72 hours. Sepsis Labs: Recent Labs  Lab 06/13/2020 1307 06/09/2020 1851  LATICACIDVEN 2.0* 1.6    Recent Results (from the past 240 hour(s))  Culture, blood (Routine x 2)     Status: None   Collection Time: 05/25/2020  1:07 PM   Specimen: BLOOD LEFT ARM  Result Value Ref Range Status   Specimen Description BLOOD LEFT ARM  Final   Special Requests   Final    BOTTLES DRAWN AEROBIC AND ANAEROBIC Blood Culture results may not be optimal due to an excessive volume of blood received in culture bottles   Culture   Final    NO GROWTH 5 DAYS Performed at [facility], [address] [place], [place] [address]    Report Status 06/26/2020 FINAL  Final  Culture, blood (Routine x 2)     Status: None   Collection Time: 06/19/2020  8:37 PM   Specimen: BLOOD  Result Value Ref Range Status    Specimen Description BLOOD RIGHT ANTECUBITAL  Final   Special Requests   Final    BOTTLES DRAWN AEROBIC AND ANAEROBIC Blood Culture adequate volume   Culture   Final    NO GROWTH 5 DAYS Performed at [facility], [address] [place], [place] [address]    Report Status 06/26/2020 FINAL  Final  Urine Culture     Status: Abnormal   Collection Time: 06/01/2020  8:37 PM   Specimen: Urine, Random  Result Value Ref Range Status   Specimen Description   Final    URINE, RANDOM Performed at [facility], [address], [place], [place] [address]    Special Requests   Final    NONE Performed at [facility], [address] [place], [place] [address]    Culture >=100,000 COLONIES/mL ESCHERICHIA COLI (A)  Final   Report Status 06/23/2020 FINAL  Final   Organism ID, Bacteria ESCHERICHIA COLI (A)  Final      Susceptibility   Escherichia coli - MIC*    AMPICILLIN <=2 SENSITIVE Sensitive     CEFAZOLIN <=4 SENSITIVE Sensitive     CEFTRIAXONE <=0.25 SENSITIVE Sensitive     CIPROFLOXACIN >=4 RESISTANT Resistant     GENTAMICIN <=1 SENSITIVE Sensitive     IMIPENEM <=0.25 SENSITIVE Sensitive     NITROFURANTOIN <=16 SENSITIVE Sensitive     TRIMETH/SULFA <=20 SENSITIVE Sensitive     AMPICILLIN/SULBACTAM <=2 SENSITIVE Sensitive     PIP/TAZO <=4 SENSITIVE Sensitive     * >=100,000 COLONIES/mL ESCHERICHIA COLI  Respiratory Panel by RT PCR (Flu A&B, Covid) - Nasopharyngeal Swab     Status: None   Collection Time: 06/14/2020  8:46 PM   Specimen: Nasopharyngeal Swab  Result Value Ref Range Status   SARS Coronavirus 2 by RT PCR NEGATIVE NEGATIVE Final    Comment: (NOTE) SARS-CoV-2 target nucleic acids are NOT DETECTED.  The SARS-CoV-2 RNA is generally detectable in upper respiratoy specimens during the acute phase of infection. The lowest concentration  of SARS-CoV-2 viral copies this assay can detect is 131 copies/mL. A negative result does not preclude  SARS-Cov-2 infection and should not be used as the sole basis for treatment or other patient management decisions. A negative result may occur with  improper specimen collection/handling, submission of specimen other than nasopharyngeal swab, presence of viral mutation(s) within the areas targeted by this assay, and inadequate number of viral copies (<131 copies/mL). A negative result must be combined with clinical observations, patient history, and epidemiological information. The expected result is Negative.  Fact Sheet for Patients:  PinkCheek.be  Fact Sheet for Healthcare Providers:  GravelBags.it  This test is no t yet approved or cleared by the [place] FDA and  has been authorized for detection and/or diagnosis of SARS-CoV-2 by FDA under an Emergency Use Authorization (EUA). This EUA will remain  in effect (meaning this test can be used) for the duration of the COVID-19 declaration under Section 564(b)(1) of the Act, 21 U.S.C. section 360bbb-3(b)(1), unless the authorization is terminated or revoked sooner.     Influenza A by PCR NEGATIVE NEGATIVE Final   Influenza B by PCR NEGATIVE NEGATIVE Final    Comment: (NOTE) The Xpert Xpress SARS-CoV-2/FLU/RSV assay is intended as an aid in  the diagnosis of influenza from Nasopharyngeal swab specimens and  should not be used as a sole basis for treatment. Nasal washings and  aspirates are unacceptable for Xpert Xpress SARS-CoV-2/FLU/RSV  testing.  Fact Sheet for Patients: PinkCheek.be  Fact Sheet for Healthcare Providers: GravelBags.it  This test is not yet approved or cleared by the [place] FDA and  has been authorized for detection and/or diagnosis of SARS-CoV-2 by  FDA under an Emergency Use Authorization (EUA). This EUA will remain  in effect (meaning this test can be used) for the duration of the   Covid-19 declaration under Section 564(b)(1) of the Act, 21  U.S.C. section 360bbb-3(b)(1), unless the authorization is  terminated or revoked. Performed at [facility], [address], [place], [place] [address]   Urine Culture     Status: Abnormal   Collection Time: 06/23/20  3:07 PM   Specimen: Urine, Random  Result Value Ref Range Status   Specimen Description   Final    URINE, RANDOM Performed at [facility], [address], [place], [place] [address]    Special Requests   Final    NONE Performed at [facility], [address] [place], [place] [address]    Culture MULTIPLE SPECIES PRESENT, SUGGEST RECOLLECTION (A)  Final   Report Status 06/25/2020 FINAL  Final     Radiology Studies: DG Chest Port 1 View  Result Date: 06/27/2020 CLINICAL DATA:  Altered mental status EXAM: PORTABLE CHEST 1 VIEW COMPARISON:  06/25/2020 FINDINGS: Chronic interstitial prominence. Improved aeration at the left lung base. No pleural effusion or pneumothorax. Stable cardiomediastinal contours. IMPRESSION: Improved aeration at the left lung base since 06/25/2020. No new findings. Electronically Signed   By: Macy Mis M.D.   On: 06/27/2020 08:07    Scheduled Meds: . acidophilus  1 capsule Oral Daily  . apixaban  2.5 mg Oral BID  . diltiazem  300 mg Oral Daily  . escitalopram  10 mg Oral Daily  . feeding supplement (ENSURE ENLIVE)  237 mL Oral BID BM  . fluticasone  2 spray Each Nare Daily  . furosemide  20 mg Intravenous Q12H  . insulin aspart  0-9 Units Subcutaneous TID WC  . magic mouthwash  15 mL Oral TID  . methylPREDNISolone (SOLU-MEDROL) injection  20 mg Intravenous Q24H  . metoprolol succinate  25 mg Oral Daily  . mometasone-formoterol  2 puff Inhalation BID  . multivitamin with minerals  1 tablet Oral Daily  . pantoprazole  40 mg Oral Daily  . potassium chloride  20 mEq Oral Daily   Continuous Infusions:   LOS: 7 days   Time spent: 25  minutes.  Lorella Nimrod, MD [facility]  If 7PM-7AM, please contact night-coverage [email or web address]  06/28/2020, 12:50 PM   This record has been created using Systems analyst. Errors have been sought and corrected,but may not always be located. Such creation errors do not reflect on the standard of care.

## 2020-06-28 NOTE — Care Management Important Message (Signed)
Important Message  Patient Details  Name: Eric Li MRN: [id number] Date of Birth: 12/10/1934   Medicare Important Message Given:  Yes     [name] 06/28/2020, 2:12 PM

## 2020-06-29 DIAGNOSIS — J9611 Chronic respiratory failure with hypoxia: Secondary | ICD-10-CM | POA: Diagnosis not present

## 2020-06-29 LAB — GLUCOSE, CAPILLARY
Glucose-Capillary: 103 mg/dL — ABNORMAL HIGH (ref 70–99)
Glucose-Capillary: 151 mg/dL — ABNORMAL HIGH (ref 70–99)
Glucose-Capillary: 217 mg/dL — ABNORMAL HIGH (ref 70–99)
Glucose-Capillary: 171 mg/dL — ABNORMAL HIGH (ref 70–99)

## 2020-06-29 LAB — BASIC METABOLIC PANEL
Anion gap: 11 (ref 5–15)
BUN: 31 mg/dL — ABNORMAL HIGH (ref 8–23)
CO2: 27 mmol/L (ref 22–32)
Calcium: 8.4 mg/dL — ABNORMAL LOW (ref 8.9–10.3)
Chloride: 96 mmol/L — ABNORMAL LOW (ref 98–111)
Creatinine, Ser: 0.72 mg/dL (ref 0.61–1.24)
GFR calc non Af Amer: >60 mL/min (ref >60)
Glucose, Bld: 119 mg/dL — ABNORMAL HIGH (ref 70–99)
Potassium: 3.6 mmol/L (ref 3.5–5.1)
Sodium: 134 mmol/L — ABNORMAL LOW (ref 135–145)

## 2020-06-29 MED ORDER — METOPROLOL TARTRATE 5 MG/5ML IV SOLN
5.0000 mg | Freq: Once | INTRAVENOUS | Status: AC
  Administered 2020-06-29: 5 mg via INTRAVENOUS
  Filled 2020-06-29: qty 5

## 2020-06-29 MED ORDER — DILTIAZEM HCL ER COATED BEADS 180 MG PO CP24
360.0000 mg | ORAL_CAPSULE | Freq: Every day | ORAL | Status: DC
  Administered 2020-06-30 – 2020-07-09 (×10): 360 mg via ORAL
  Filled 2020-06-29 (×12): qty 2

## 2020-06-29 NOTE — Progress Notes (Signed)
Pulmonary Medicine          Date: 06/29/2020,   MRN# [id number] Eric Li 08/19/1935     AdmissionWeight: 53.5 kg                 CurrentWeight: 53.8 kg   Referring physician: Dr Roger Shelter   CHIEF COMPLAINT:   Acute hypoxemic respiratory failure   HISTORY OF PRESENT ILLNESS   As per admission h/p Eric Li is a 85 y.o. male with medical history significant for paroxysmal A. fib on apixaban, HTN, CAD, COPD and pulmonary fibrosis with chronic respiratory failure on home O2 at 3 L, who was brought to the emergency room by EMS because of confusion. Family called with concerns for patient waking up disoriented believing he was somewhere else and appeared to be not acting himself and " talking out of his head". Wife also voiced concern for possible UTI as his urination was frequent and with strong odor. He has had no fever or chills, or chest pain or shortness of breath. Has had no nausea, vomiting or change in bowel habits. He is fully vaccinated against Covid. On arrival, his vitals were within normal limits without fever or tachycardia and with O2 sat 97% on O2 at home flow rate . Blood work was significant for WBC of 11,700 and lactic acid of 2>>1.6. Urinalysis showed pyuria. Other blood work mostly unremarkable.  EKG  NSR with RBBB with nonspecific ST-T wave changes  Chest x-ray showed no acute findings, head CT no acute findings. Due to patient complaint of abdominal pain CT abdomen and pelvis with contrast was done which showed asymmetry of the rectum and adjacent portion of the sigmoid colon. Pulmonary consultation for worsening dyspnea with acute on chronic hypoxemic respiratory failure.   06/25/20- patient evaluated at bedside this afternoon.  He had repeat CXR overnight with worsening LLL infiltrate.  Family member at bedside (daughter) shares that he had desatutation event after eating. This suggests aspiration. He made adequate urine with lasix with notable  improvement after diuresis as per family.  MetNEB was unable to be used due to patient being sleepy unable to participate.    06/26/20-  Patient with mild imrpovement.  Noted Palliative evaluation appreciate input. Will institute OT/PT starting tommorow.  Contiue gentle diuresis and MetaNEB therapy as well as current antimicrobials.   06/27/20- Patient seen and examined this morning he seems confused.  Granddaughter at bedside during my examination states he has been having similar symptoms for past 1/2 year and thoughts were revolving around dementia workup. This seems to be episode of sundowning. Patient was significantly more oriented and lucid yesterday mid-day.  He had CXR today with good improvement of previous infiltrates.   06/28/20- patient is down to 45% 45L/min in HFNC which is significantly better then previous. He is resting in bed comfortably. Family at bedside during my evaluation.   06/29/20- patient resting in bed similar setting on HFNC. No changes in recommendations today.   PAST MEDICAL HISTORY   Past Medical History:  Diagnosis Date  . Bilateral hearing loss 03/03/2016  . CAD (coronary artery disease) 02/24/2014  . CHF (congestive heart failure) ([place])   . COPD, moderate ([place]) 08/29/2015  . Hemorrhoids 02/24/2014  . HTN (hypertension) 02/24/2014  . Hyperlipidemia, unspecified 02/24/2014  . Nephrolithiasis   . SOB (shortness of breath) on exertion 03/27/2015  . Statin intolerance 03/30/2015     SURGICAL HISTORY   Past Surgical History:  Procedure Laterality Date  .  CHOLECYSTECTOMY    . TONSILLECTOMY       FAMILY HISTORY   Family History  Problem Relation Age of Onset  . Bladder Cancer Neg Hx   . Prolactinoma Neg Hx   . Prostate cancer Neg Hx   . Kidney cancer Neg Hx      SOCIAL HISTORY   Social History   Tobacco Use  . Smoking status: Former Research scientist (life sciences)  . Smokeless tobacco: Never Used  Vaping Use  . Vaping Use: Never used  Substance Use Topics  . Alcohol use: No   . Drug use: No     MEDICATIONS    Home Medication:    Current Medication:  Current Facility-Administered Medications:  .  acetaminophen (TYLENOL) tablet 650 mg, 650 mg, Oral, Q6H PRN, 650 mg at 06/28/20 2154 **OR** acetaminophen (TYLENOL) suppository 650 mg, 650 mg, Rectal, Q6H PRN, Judd Gaudier V, MD .  acidophilus (RISAQUAD) capsule 1 capsule, 1 capsule, Oral, Daily, Shahmehdi, Seyed A, MD, 1 capsule at 06/29/20 1030 .  apixaban (ELIQUIS) tablet 2.5 mg, 2.5 mg, Oral, BID, Judd Gaudier V, MD, 2.5 mg at 06/29/20 1030 .  diclofenac Sodium (VOLTAREN) 1 % topical gel 2 g, 2 g, Topical, BID PRN, Shahmehdi, Seyed A, MD .  diltiazem (CARDIZEM CD) 24 hr capsule 300 mg, 300 mg, Oral, Daily, Lorella Nimrod, MD, 300 mg at 06/29/20 1030 .  escitalopram (LEXAPRO) tablet 10 mg, 10 mg, Oral, Daily, Judd Gaudier V, MD, 10 mg at 06/29/20 1028 .  feeding supplement (ENSURE ENLIVE) (ENSURE ENLIVE) liquid 237 mL, 237 mL, Oral, BID BM, Lorella Nimrod, MD, 237 mL at 06/29/20 1031 .  fluticasone (FLONASE) 50 MCG/ACT nasal spray 2 spray, 2 spray, Each Nare, Daily, Judd Gaudier V, MD, 2 spray at 06/29/20 1032 .  furosemide (LASIX) injection 20 mg, 20 mg, Intravenous, Q12H, Leilene Diprima, MD, 20 mg at 06/29/20 0600 .  insulin aspart (novoLOG) injection 0-9 Units, 0-9 Units, Subcutaneous, TID WC, Shahmehdi, Seyed A, MD, 1 Units at 06/28/20 1702 .  levalbuterol (XOPENEX) nebulizer solution 0.63 mg, 0.63 mg, Nebulization, Q6H PRN, Lang Snow, NP, 0.63 mg at 06/27/20 0845 .  magic mouthwash, 15 mL, Oral, TID, Lorella Nimrod, MD, 15 mL at 06/29/20 1028 .  metoprolol succinate (TOPROL-XL) 24 hr tablet 25 mg, 25 mg, Oral, Daily, Sumeya Yontz, MD, 25 mg at 06/29/20 1028 .  mometasone-formoterol (DULERA) 200-5 MCG/ACT inhaler 2 puff, 2 puff, Inhalation, BID, Athena Masse, MD, 2 puff at 06/29/20 1032 .  multivitamin with minerals tablet 1 tablet, 1 tablet, Oral, Daily, Athena Masse, MD, 1 tablet at  06/29/20 1030 .  nitroGLYCERIN (NITROSTAT) SL tablet 0.4 mg, 0.4 mg, Sublingual, Q5 min PRN, Judd Gaudier V, MD .  ondansetron (ZOFRAN) tablet 4 mg, 4 mg, Oral, Q6H PRN **OR** ondansetron (ZOFRAN) injection 4 mg, 4 mg, Intravenous, Q6H PRN, Athena Masse, MD .  pantoprazole (PROTONIX) EC tablet 40 mg, 40 mg, Oral, Daily, Judd Gaudier V, MD, 40 mg at 06/29/20 1030 .  potassium chloride 20 MEQ/15ML (10%) solution 20 mEq, 20 mEq, Oral, Daily, Duron Meister, MD, 20 mEq at 06/29/20 1028 .  predniSONE (DELTASONE) tablet 40 mg, 40 mg, Oral, Q breakfast, Terrion Poblano, MD, 40 mg at 06/29/20 1031 .  psyllium (HYDROCIL/METAMUCIL) 1 packet, 1 packet, Oral, Daily PRN, Athena Masse, MD, 1 packet at 06/29/20 1028 .  traMADol (ULTRAM) tablet 50 mg, 50 mg, Oral, Q6H PRN, Shahmehdi, Seyed A, MD, 50 mg at 06/28/20 2154 .  traZODone (  DESYREL) tablet 50 mg, 50 mg, Oral, QHS PRN, Shahmehdi, Seyed A, MD, 50 mg at 06/28/20 2154    ALLERGIES   Simvastatin     REVIEW OF SYSTEMS    Review of Systems:  Gen:  Denies  fever, sweats, chills weigh loss  HEENT: Denies blurred vision, double vision, ear pain, eye pain, hearing loss, nose bleeds, sore throat Cardiac:  No dizziness, chest pain or heaviness, chest tightness,edema Resp:   Denies cough or sputum porduction, shortness of breath,wheezing, hemoptysis,  Gi: Denies swallowing difficulty, stomach pain, nausea or vomiting, diarrhea, constipation, bowel incontinence Gu:  Denies bladder incontinence, burning urine Ext:   Denies Joint pain, stiffness or swelling Skin: Denies  skin rash, easy bruising or bleeding or hives Endoc:  Denies polyuria, polydipsia , polyphagia or weight change Psych:   Denies depression, insomnia or hallucinations   Other:  All other systems negative   VS: BP 106/78 (BP Location: Left Arm)   Pulse 65   Temp 97.9 F (36.6 C) (Oral)   Resp 20   Ht 5\' 6"  (1.676 m)   Wt 53.8 kg   SpO2 94%   BMI 19.14 kg/m       PHYSICAL EXAM    GENERAL:NAD, no fevers, chills, no weakness no fatigue HEAD: Normocephalic, atraumatic.  EYES: Pupils equal, round, reactive to light. Extraocular muscles intact. No scleral icterus.  MOUTH: Moist mucosal membrane. Dentition intact. No abscess noted.  EAR, NOSE, THROAT: Clear without exudates. No external lesions.  NECK: Supple. No thyromegaly. No nodules. No JVD.  PULMONARY: mild ronchi bilaterally  CARDIOVASCULAR: S1 and S2. Regular rate and rhythm. No murmurs, rubs, or gallops. No edema. Pedal pulses 2+ bilaterally.  GASTROINTESTINAL: Soft, nontender, nondistended. No masses. Positive bowel sounds. No hepatosplenomegaly.  MUSCULOSKELETAL: No swelling, clubbing, or edema. Range of motion full in all extremities.  NEUROLOGIC: Cranial nerves II through XII are intact. No gross focal neurological deficits. Sensation intact. Reflexes intact.  SKIN: No ulceration, lesions, rashes, or cyanosis. Skin warm and dry. Turgor intact.  PSYCHIATRIC: Mood, affect within normal limits. The patient is awake, alert and oriented x 3. Insight, judgment intact.       IMAGING    DG Chest 1 View  Result Date: 06/25/2020 CLINICAL DATA:  Shortness of breath EXAM: CHEST  1 VIEW COMPARISON:  June 24, 2020 FINDINGS: The heart size and mediastinal contours are unchanged with mild cardiomegaly. Aortic knob calcifications are seen. Again noted is hyperinflation of the lung zones with flattening of the hemidiaphragms. There is mildly increased interstitial opacities seen at both lower lungs. There is new hazy airspace opacity seen at the left lung base. No acute osseous abnormality. IMPRESSION: Increased hazy airspace opacity at the left lung base which could be due to atelectasis and/or infectious etiology. Probable chronic interstitial opacities at both lungs. Electronically Signed   By: Prudencio Pair M.D.   On: 06/25/2020 03:47   DG Chest 2 View  Result Date: 06/20/2020 CLINICAL DATA:   Suspect sepsis. EXAM: CHEST - 2 VIEW COMPARISON:  03/25/2019 FINDINGS: Heart size and vascularity normal. Mild bibasilar atelectasis. Small right effusion. Elevated left hemidiaphragm with bowel gas below the left diaphragm. Mild hyperinflation lungs. No mass lesion. No acute skeletal abnormality. IMPRESSION: Mild bibasilar atelectasis and small right effusion.   COPD. Electronically Signed   By: Franchot Gallo M.D.   On: 05/28/2020 13:35   CT Head Wo Contrast  Result Date: 05/26/2020 CLINICAL DATA:  Altered mental status. EXAM: CT HEAD WITHOUT CONTRAST  TECHNIQUE: Contiguous axial images were obtained from the base of the skull through the vertex without intravenous contrast. COMPARISON:  March 16, 2018 FINDINGS: Brain: There is mild cerebral atrophy with widening of the extra-axial spaces and ventricular dilatation. There are areas of decreased attenuation within the white matter tracts of the supratentorial brain, consistent with microvascular disease changes. Vascular: No hyperdense vessel or unexpected calcification. Skull: Normal. Negative for fracture or focal lesion. Sinuses/Orbits: Very mild, bilateral ethmoid sinus mucosal thickening is seen. Other: None. IMPRESSION: 1. Generalized cerebral atrophy. 2. Very mild, bilateral ethmoid sinus disease. 3. No acute intracranial abnormality. Electronically Signed   By: Virgina Norfolk M.D.   On: 06/04/2020 21:11   MR PELVIS WO CONTRAST  Result Date: 06/24/2020 CLINICAL DATA:  Possible rectal mass on CT EXAM: MRI PELVIS WITHOUT CONTRAST TECHNIQUE: Multiplanar multisequence MR imaging of the pelvis was performed. No intravenous contrast was administered. Small amount of [place] gel was administered per rectum to optimize tumor evaluation. COMPARISON:  CT abdomen/pelvis dated 06/18/2020 FINDINGS: Urinary Tract:  Bladder is within normal limits. Bowel: Distension of the rectum with ultrasound gel. No rectal mass is seen. Visualized bowel is otherwise unremarkable,  including the sigmoid colon. Vascular/Lymphatic: No evidence of aneurysm. No suspicious pelvic lymphadenopathy. Reproductive:  Suspected postsurgical changes related to prior TURP. Other:  No pelvic ascites. Musculoskeletal: Mild degenerative changes of the lower lumbar spine. IMPRESSION: No rectosigmoid mass is evident on MR. Negative pelvic MRI. Electronically Signed   By: Julian Hy M.D.   On: 06/24/2020 12:15   MR ABDOMEN W WO CONTRAST  Result Date: 06/24/2020 CLINICAL DATA:  Inpatient. Frequent UTI. COPD and pulmonary fibrosis with chronic respiratory failure. Confusion. Concern for rectal mass on recent CT. EXAM: MRI ABDOMEN WITHOUT AND WITH CONTRAST TECHNIQUE: Multiplanar multisequence MR imaging of the abdomen was performed both before and after the administration of intravenous contrast. CONTRAST:  7.39mL GADAVIST GADOBUTROL 1 MMOL/ML IV SOLN COMPARISON:  06/01/2020 CT abdomen/pelvis. FINDINGS: Lower chest: Small dependent bilateral pleural effusions, right greater than left. Hepatobiliary: Normal liver size and configuration. No hepatic steatosis. No liver mass. Cholecystectomy. Bile ducts are within normal post cholecystectomy limits. Common bile duct diameter 5 mm. No choledocholithiasis. No biliary masses, strictures or beading. Probable 12 mm diameter cystic duct remnant in the porta hepatis (series 4/image 14). Pancreas: No pancreatic mass or duct dilation.  No pancreas divisum. Spleen: Normal size. No mass. Adrenals/Urinary Tract: Normal adrenals. No hydronephrosis. Scattered simple subcentimeter renal cortical cysts in both kidneys. No suspicious renal masses. Stomach/Bowel: Normal non-distended stomach. Visualized small and large bowel is normal caliber, with no bowel wall thickening. Vascular/Lymphatic: Atherosclerotic nonaneurysmal abdominal aorta. Patent portal, splenic, hepatic and renal veins. Retroaortic left renal vein. No pathologically enlarged lymph nodes in the abdomen. Other:  No abdominal ascites or focal fluid collection. Musculoskeletal: No aggressive appearing focal osseous lesions. Mild L4 vertebral compression fracture, chronic appearing. IMPRESSION: 1. No lymphadenopathy or other findings to suggest metastatic disease in the abdomen. MRI pelvis to be reported separately. 2. Bile ducts are within normal post cholecystectomy limits (CBD diameter 5 mm). No choledocholithiasis. Probable small cystic duct remnant in the porta hepatis. 3. Small dependent bilateral pleural effusions, right greater than left. 4. Mild L4 vertebral compression fracture, chronic appearing. Electronically Signed   By: Ilona Sorrel M.D.   On: 06/24/2020 07:01   CT ABDOMEN PELVIS W CONTRAST  Result Date: 06/04/2020 CLINICAL DATA:  Abdominal pain. EXAM: CT ABDOMEN AND PELVIS WITH CONTRAST TECHNIQUE: Multidetector CT imaging of  the abdomen and pelvis was performed using the standard protocol following bolus administration of intravenous contrast. CONTRAST:  153mL OMNIPAQUE IOHEXOL 300 MG/ML  SOLN COMPARISON:  None. FINDINGS: Lower chest: A trace amount of atelectasis is seen within the right lung base. A very small right pleural effusion is noted. Hepatobiliary: No focal liver abnormality is seen. Status post cholecystectomy. No biliary dilatation. Pancreas: Unremarkable. No pancreatic ductal dilatation or surrounding inflammatory changes. Spleen: Normal in size without focal abnormality. Adrenals/Urinary Tract: Adrenal glands are unremarkable. Kidneys are normal, without renal calculi, focal lesion, or hydronephrosis. Bladder is unremarkable. Stomach/Bowel: Stomach is within normal limits. Appendix appears normal. No evidence of bowel dilatation. There is marked severity thickening of the rectum and adjacent portion of the distal sigmoid colon (axial CT images 71 through 75, CT series number 2). This is slightly asymmetric in appearance, right greater than left. No associated inflammatory fat stranding is  seen. Vascular/Lymphatic: There is marked severity calcification and atherosclerosis of the abdominal aorta and bilateral common iliac arteries, without evidence of aneurysmal dilatation. No enlarged abdominal or pelvic lymph nodes. Reproductive: The prostate gland is mildly enlarged. Other: No abdominal wall hernia or abnormality. No abdominopelvic ascites. Musculoskeletal: Moderate to marked severity multilevel degenerative changes seen throughout the lumbar spine. IMPRESSION: 1. Asymmetric of the rectum and adjacent portion of the sigmoid colon, which may be secondary to an underlying mass. Correlation with physical examination and MRI is recommended. 2. Evidence of prior cholecystectomy. 3. Very small right pleural effusion. 4. Mildly enlarged prostate gland. 5. Moderate to marked severity multilevel degenerative changes throughout the lumbar spine. 6. Aortic atherosclerosis. Aortic Atherosclerosis (ICD10-I70.0). Electronically Signed   By: Virgina Norfolk M.D.   On: 06/12/2020 21:22   DG Chest Port 1 View  Result Date: 06/27/2020 CLINICAL DATA:  Altered mental status EXAM: PORTABLE CHEST 1 VIEW COMPARISON:  06/25/2020 FINDINGS: Chronic interstitial prominence. Improved aeration at the left lung base. No pleural effusion or pneumothorax. Stable cardiomediastinal contours. IMPRESSION: Improved aeration at the left lung base since 06/25/2020. No new findings. Electronically Signed   By: Macy Mis M.D.   On: 06/27/2020 08:07   DG Chest Port 1 View  Result Date: 06/24/2020 CLINICAL DATA:  Shortness of breath, pulmonary fibrosis, altered mental status EXAM: PORTABLE CHEST 1 VIEW COMPARISON:  06/15/2020 FINDINGS: Mild cardiomegaly. Emphysema. Mild, diffuse interstitial opacity. Bibasilar fibrotic scarring. The visualized skeletal structures are unremarkable. IMPRESSION: Emphysema and bibasilar fibrotic change with mild, diffuse interstitial opacity, potentially edema. No focal airspace opacity.  Electronically Signed   By: Eddie Candle M.D.   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    Patient/Family are satisfied with care plan and all questions have been answered.  This document was prepared using Dragon voice recognition software and may include unintentional dictation  errors.     Ottie Glazier, M.D.  Div[facility] of [place]

## 2020-06-29 NOTE — Progress Notes (Signed)
PT Cancellation Note  Patient Details Name: Eric Li MRN: [id number] DOB: April 07, 1935   Cancelled Treatment:    Reason Eval/Treat Not Completed: Other (comment) Upon arrival to room, lights dimmed and pt sleeping soundly. Daughter and granddaughter present report that pt gets grumpy and groggy when awoken and requesting attempt later to allow pt to continue to sleep at this time. Will attempt another date/time.   [place]ale Haven 06/29/2020, 9:12 AM

## 2020-06-29 NOTE — Progress Notes (Signed)
Occupational Therapy Treatment Patient Details Name: Eric Li MRN: [id number] DOB: May 11, 1935 Today's Date: 06/29/2020    History of present illness Eric Li is an 85 y/o male who was admitted with chief complaint of abdominal pain and confusion. Pt found to have a UTI. PMH includes paroxysmal A-fib on Apixaban, HTN, HLD, CAD, CHF, COPD, pulmonary fibrosis with chronic respiratory failure, on home O2 at 3L, bilateral hearing loss, nephrolitiasis, SOB on exertion, and statin intolerance.   OT comments  [name] was seen for OT treatment on this date. Upon arrival to room pt awake, reclined in bed c family at bedside. Pt agreeable to tx. MAX A exit L side of bed - assist for trunk control. Pt requires multimodal cues to use L rail and OT hand for support. MAX A sitting balance - heavy posterior lean c BUE support; unable to progress to seated ADLs w/o +2 assist. PT in room at end of session to assist +2 return to bed and rolling. Pt making good progress toward goals. Pt continues to benefit from skilled OT services to maximize return to PLOF and minimize risk of future falls, injury, caregiver burden, and readmission. Will continue to follow POC. Discharge recommendation remains appropriate.   Reclined: HR 90s, SpO2 92% on 45% 45L/min HFNC  Seated: HR 124, SpO2 96%   Follow Up Recommendations  SNF;Supervision/Assistance - 24 hour    Equipment Recommendations  3 in 1 bedside commode    Recommendations for Other Services      Precautions / Restrictions Precautions Precautions: Fall Restrictions Weight Bearing Restrictions: No       Mobility Bed Mobility Overal bed mobility: Needs Assistance Bed Mobility: Supine to Sit;Sit to Supine;Rolling Rolling: Mod assist   Supine to sit: Max assist;HOB elevated Sit to supine: Max assist;+2 for physical assistance   General bed mobility comments: MAX A x2 to scoot higher in bed c draw sheet. MOD A + VCs for L  rolling  Transfers        General transfer comment: Deferred - pt faitgued and increased HR in sitting    Balance Overall balance assessment: Needs assistance Sitting-balance support: Bilateral upper extremity supported;Feet supported Sitting balance-Leahy Scale: Poor Sitting balance - Comments: pt with strong posterior lean with edge of bed sitting requiring MAX A for upright balance. Able to bring self upright c cues but unable to maintain Postural control: Posterior lean               ADL either performed or assessed with clinical judgement   ADL Overall ADL's : Needs assistance/impaired          General ADL Comments: MOD I self-feeding at bed level. MAX A sitting balance - requires BUE suppport and heavy posterior lean - unable to progress to seated ADLs w/o +2 assist.                Cognition Arousal/Alertness: Awake/alert Behavior During Therapy: WFL for tasks assessed/performed Overall Cognitive Status: History of cognitive impairments - at baseline           Exercises Exercises: Other exercises Other Exercises Other Exercises: Pt educated re: falls preventino, ECS, importance of mobility for functional strengthening Other Exercises: Sup<>sit, sitting/standing balance/tolerance, seated marches           Pertinent Vitals/ Pain       Pain Assessment: No/denies pain         Frequency  Min 1X/week        Progress Toward Goals  OT Goals(current goals can now be found in the care plan section)  Progress towards OT goals: Progressing toward goals  Acute Rehab OT Goals Patient Stated Goal: to go home - not to go to a facility unless absolutely necessary OT Goal Formulation: With patient/family Time For Goal Achievement: 07/11/20 Potential to Achieve Goals: Fair ADL Goals Pt Will Perform Grooming: sitting;with min assist Pt Will Transfer to Toilet: squat pivot transfer;bedside commode;with min assist Pt Will Perform Toileting - Clothing  Manipulation and hygiene: with min assist;sitting/lateral leans;with caregiver independent in assisting;with adaptive equipment (c LRAD PRN for improved safety and functional independence.)  Plan Discharge plan remains appropriate;Frequency remains appropriate       AM-PAC OT "6 Clicks" Daily Activity     Outcome Measure   Help from another person eating meals?: A Little Help from another person taking care of personal grooming?: A Little Help from another person toileting, which includes using toliet, bedpan, or urinal?: A Lot Help from another person bathing (including washing, rinsing, drying)?: A Lot Help from another person to put on and taking off regular upper body clothing?: A Lot Help from another person to put on and taking off regular lower body clothing?: A Lot 6 Click Score: 14    End of Session Equipment Utilized During Treatment: Oxygen  OT Visit Diagnosis: Other abnormalities of gait and mobility (R26.89);Muscle weakness (generalized) (M62.81);Other symptoms and signs involving cognitive function   Activity Tolerance Patient tolerated treatment well   Patient Left in bed;with family/visitor present;Other (comment) (in bed c PT at bed side)   Nurse Communication          Time: 0768-0881 OT Time Calculation (min): 12 min  Charges: OT General Charges $OT Visit: 1 Visit OT Treatments $Therapeutic Activity: 8-22 mins  Dessie Coma, M.S. OTR/L  06/29/20, 2:48 PM  ascom [phone]

## 2020-06-29 NOTE — Progress Notes (Signed)
Provider notified HR elevated. PT converted from Aflutter to Sinus tach. HR increased 120-130 MEWS score green-yellow One time dose of metop 5mg  given HR now 90-100.  Pt is resting comfortably. He appears to be in no apparent distress. Daughter at bedside and updated on plan of care.  Will continue to closely monitor.

## 2020-06-29 NOTE — Progress Notes (Signed)
Initial Nutrition Assessment  DOCUMENTATION CODES:   Not applicable  INTERVENTION:   Ensure Enlive po BID, each supplement provides 350 kcal and 20 grams of protein  Magic cup TID with meals, each supplement provides 290 kcal and 9 grams of protein  MVI daily   Pt at moderate refeed risk; recommend monitor potassium, magnesium and phosphorus labs daily as oral intake improves.  NUTRITION DIAGNOSIS:   Inadequate oral intake related to acute illness as evidenced by meal completion < 25%.  GOAL:   Patient will meet greater than or equal to 90% of their needs  MONITOR:   PO intake, Supplement acceptance, Labs, Weight trends, Skin, I & O's  REASON FOR ASSESSMENT:   Consult Assessment of nutrition requirement/status  ASSESSMENT:   85 year old male with history of paroxysmal A. fib on apixaban, HTN, CAD, frequent UTIs, COPD and pulmonary fibrosis with chronic respiratory failure on home O2 at 3 L presenting with altered mental status secondary to UTI.  RD working remotely.  Unable to reach pt by phone. Per chart review, family reports pt with poor appetite and oral intake and difficulty with mastication of solids/meats d/t no bottom teeth pta. Family reports that pt often "sipped on" an Ensure drink throughout the day. Pt has been tolerating pureed foods in his diet per the daughter (mashed potatoes, pureed fruit). Pt documented to be eating ~25% of meals in hospital; pt is also drinking some Ensure supplements. Pt is noted to have thrush. Pt seen by SLP today who recommended dysphagia 1/thin liquid diet. Recommend continue Ensure and daily MVI. Pt will be receiving Magic Cups on his meal trays. Per chart, pt is down 6lbs(5%) over the past year; it appears that the weight was lost from January to June. Pt appears to be weight stable since June. Pt is at high risk for malnutrition but unable to diagnose at this time. RD will obtain nutrition related history and exam at follow-up.    Medications reviewed and include: risaquad, lasix, insulin, MVI, prednisone, KCl, protonix  Labs reviewed: Na 134(L), BUN 31(H) P 3.4 wnl- 10/5  NUTRITION - FOCUSED PHYSICAL EXAM: Unable to perform at this time   Diet Order:   Diet Order            DIET - DYS 1 Room service appropriate? Yes with Assist; Fluid consistency: Thin  Diet effective now                EDUCATION NEEDS:   No education needs have been identified at this time  Skin:  Skin Assessment: Reviewed RN Assessment (ecchymosis)  Last BM:  10/7  Height:   Ht Readings from Last 1 Encounters:  05/25/2020 5\' 6"  (1.676 m)    Weight:   Wt Readings from Last 1 Encounters:  06/29/20 53.8 kg    Ideal Body Weight:  59 kg  BMI:  Body mass index is 19.14 kg/m.  Estimated Nutritional Needs:   Kcal:  1600-1800kcal/day  Protein:  75-85g/day  Fluid:  1.4-1.6L/day  Koleen Distance MS, RD, LDN Please refer to [facility] for RD and/or RD on-call/weekend/after hours pager

## 2020-06-29 NOTE — Progress Notes (Addendum)
PROGRESS NOTE    Eric Li  UVO:[id number] DOB: 13-May-1935 DOA: 06/20/2020 PCP: [name], MD   Brief Narrative: Taken from prior notes. Eric Li a 85 year old male with history of paroxysmal A. fib on apixaban, HTN, CAD, frequent UTIs, COPD and pulmonary fibrosis with chronic respiratory failure on home O2 at 3 L presenting with altered mental status secondary to UTI.  Completed treatment with ceftriaxone. Developed acute on chronic hypoxic respiratory failure secondary to exacerbation of his pulmonary fibrosis requiring heated HFNC. Pulmonary was consulted.  Subjective: Patient remained on same level of oxygen.  Appears comfortable.  No complaints.  Daughter was at bedside who was concerned about some dysphagia stating that he is unable to eat food in hospital and requesting for a swallow evaluation.  Patient does not have his teeth, difficult to chew, mostly takes pured food.  Later paged by nursing staff that patient went into a flutter with RVR.  Assessment & Plan:   Principal Problem:   Acute metabolic encephalopathy Active Problems:   CAD (coronary artery disease)   HTN (hypertension)   UTI (urinary tract infection)   PAF (paroxysmal atrial fibrillation) (HCC)   Pulmonary fibrosis (HCC)   Chronic respiratory failure with hypoxia (HCC)   COPD, severe (HCC)   Abnormal computed tomography angiography (CTA) of abdomen and pelvis   Goals of care, counseling/discussion   Palliative care by specialist   DNR (do not resuscitate)  Acute on chronic respiratory failure with hypoxia.  Most likely secondary to pulmonary fibrosis exacerbation.  Pulmonary was consulted and they started him on steroid.  Chest x-ray done on 06/25/2020 with left basilar opacity with concern of aspiration pneumonia.  Patient completed a course with ceftriaxone.  Subsequent chest x-ray with some improvement in that infiltrate. Remains on heated HFNC at 40 L and 40% of FiO2.  Baseline  oxygen requirement of 3-4 L. -Solu-Medrol was transitioned to prednisone by pulmonary. -Continue with DuoNeb. -Continue with MetaNeb. -Continue with IV Lasix. -Try weaning if tolerates.  Dysphagia.  Daughter was concerned that he is having difficulty swallowing the food which he was used to take it without difficulty before.  Patient does not have his teeth. -Swallow evaluation and transition to the diet according to their recommendation. -Consult to dietitian for proper nutrition.  Sepsis secondary to UTI.  Resolved. Initially met sepsis criteria with UTI.  Urine culture grew E. coli which were pretty pansensitive except quinolones.  Completed a course of ceftriaxone.  Encephalopathy.  Some waxing and waning in his mental status. High risk for delirium and sundowning secondary to his age and underlying dementia. He was alert and oriented to self only, able to recognize daughter and communicate with her. -Dementia precautions. -Continue to monitor. -Palliative care was also consulted-patient is DNR and will continue current scope of care.  Oral thrush.  Oral thrush was noted on 06/27/20.  Patient is on steroid.  Seems improving. -Continue with Magic mouthwash.  History of coronary artery disease.  No chest pain or acute EKG changes. -Continue with home dose of metoprolol, nitroglycerin. -Currently not on Zetia.  Hypertension.  Blood pressure within goal. -Continue home meds.  Paroxysmal atrial fibrillation.  Patient was stable in 60s heart rate when seen this morning.  Later converted to A. fib with RVR and sustaining heart rate in 120s. -Giving 1 dose of IV metoprolol. -Increasing Cardizem back to his original dose of 360 mg daily. -Continue with low-dose metoprolol-can discontinue if needed. -Continue with Eliquis.  Incidental abnormal computed  tomography angiography (CTA) of abdomen and pelvis -Followed by MRI, no mass was identified -MRI of abdomen pelvis completed on  06/23/2020 -no comments on sigmoid mass -GI consult has been canceled.   Objective: Vitals:   06/29/20 1028 06/29/20 1144 06/29/20 1158 06/29/20 1319  BP: 116/67  106/78 102/78  Pulse: (!) 101  65 (!) 126  Resp:   20 20  Temp:   97.9 F (36.6 C)   TempSrc:   Oral   SpO2:  92% 94% 93%  Weight:      Height:        Intake/Output Summary (Last 24 hours) at 06/29/2020 1344 Last data filed at 06/29/2020 1031 Gross per 24 hour  Intake 360 ml  Output 700 ml  Net -340 ml   Filed Weights   06/27/20 0507 06/28/20 0319 06/29/20 0441  Weight: 55.1 kg 53.2 kg 53.8 kg    Examination:  General.  Frail elderly man, in no acute distress. Pulmonary.  Few scattered dry crackles, normal respiratory effort. CV.  Regular rate and rhythm, no JVD, rub or murmur. Abdomen.  Soft, nontender, nondistended, BS positive. CNS.  Alert and oriented to self only.  No focal neurologic deficit. Extremities.  No edema, no cyanosis, pulses intact and symmetrical. Psychiatry.  Judgment and insight appears impaired.  DVT prophylaxis: Eliquis Code Status: DNR Family Communication: Daughter was updated at bedside. Disposition Plan:  Status is: Inpatient  Remains inpatient appropriate because:Inpatient level of care appropriate due to severity of illness   Dispo: The patient is from: Home              Anticipated d/c is to: SNF              Anticipated d/c date is: > 3 days              Patient currently is not medically stable to d/c.  Patient is high risk for deterioration and death secondary to advanced age and multiple underlying comorbidities.  Palliative care has been consulted.  Patient is now DNR.   Consultants:   Pulmonary  Procedures:  Antimicrobials:   Data Reviewed: I have personally reviewed following labs and imaging studies  CBC: Recent Labs  Lab 06/25/20 0439 06/28/20 0609  WBC 14.5* 16.8*  HGB 15.1 14.7  HCT 44.4 44.9  MCV 87.9 90.7  PLT 210 409   Basic Metabolic  Panel: Recent Labs  Lab 06/25/20 0439 06/26/20 0359 06/27/20 0548 06/28/20 0609 06/29/20 0455  NA 139 135 134* 136 134*  K 3.8 3.8 4.1 3.8 3.6  CL 104 99 95* 98 96*  CO2 '24 25 28 28 27  ' GLUCOSE 231* 214* 138* 174* 119*  BUN 18 28* 27* 30* 31*  CREATININE 0.67 0.71 0.70 0.68 0.72  CALCIUM 8.6* 8.7* 9.2 8.7* 8.4*  MG 2.6*  --   --   --   --   PHOS  --  3.4  --   --   --    GFR: Estimated Creatinine Clearance: 53.2 mL/min (by C-G formula based on SCr of 0.72 mg/dL). Liver Function Tests: No results for input(s): AST, ALT, ALKPHOS, BILITOT, PROT, ALBUMIN in the last 168 hours. No results for input(s): LIPASE, AMYLASE in the last 168 hours. No results for input(s): AMMONIA in the last 168 hours. Coagulation Profile: No results for input(s): INR, PROTIME in the last 168 hours. Cardiac Enzymes: No results for input(s): CKTOTAL, CKMB, CKMBINDEX, TROPONINI in the last 168 hours. BNP (last 3 results) No  results for input(s): PROBNP in the last 8760 hours. HbA1C: No results for input(s): HGBA1C in the last 72 hours. CBG: Recent Labs  Lab 06/28/20 1208 06/28/20 1624 06/28/20 2019 06/29/20 0738 06/29/20 1156  GLUCAP 174* 133* 173* 103* 151*   Lipid Profile: No results for input(s): CHOL, HDL, LDLCALC, TRIG, CHOLHDL, LDLDIRECT in the last 72 hours. Thyroid Function Tests: No results for input(s): TSH, T4TOTAL, FREET4, T3FREE, THYROIDAB in the last 72 hours. Anemia Panel: No results for input(s): VITAMINB12, FOLATE, FERRITIN, TIBC, IRON, RETICCTPCT in the last 72 hours. Sepsis Labs: No results for input(s): PROCALCITON, LATICACIDVEN in the last 168 hours.  Recent Results (from the past 240 hour(s))  Culture, blood (Routine x 2)     Status: None   Collection Time: 06/14/2020  1:07 PM   Specimen: BLOOD LEFT ARM  Result Value Ref Range Status   Specimen Description BLOOD LEFT ARM  Final   Special Requests   Final    BOTTLES DRAWN AEROBIC AND ANAEROBIC Blood Culture results may  not be optimal due to an excessive volume of blood received in culture bottles   Culture   Final    NO GROWTH 5 DAYS Performed at [facility], [address] [place], [place] [address]    Report Status 06/26/2020 FINAL  Final  Culture, blood (Routine x 2)     Status: None   Collection Time: 06/03/2020  8:37 PM   Specimen: BLOOD  Result Value Ref Range Status   Specimen Description BLOOD RIGHT ANTECUBITAL  Final   Special Requests   Final    BOTTLES DRAWN AEROBIC AND ANAEROBIC Blood Culture adequate volume   Culture   Final    NO GROWTH 5 DAYS Performed at [facility], [address] [place], [place] [address]    Report Status 06/26/2020 FINAL  Final  Urine Culture     Status: Abnormal   Collection Time: 06/08/2020  8:37 PM   Specimen: Urine, Random  Result Value Ref Range Status   Specimen Description   Final    URINE, RANDOM Performed at [facility], [address], [place], [place] [address]    Special Requests   Final    NONE Performed at [facility], [address] [place], [place] [address]    Culture >=100,000 COLONIES/mL ESCHERICHIA COLI (A)  Final   Report Status 06/23/2020 FINAL  Final   Organism ID, Bacteria ESCHERICHIA COLI (A)  Final      Susceptibility   Escherichia coli - MIC*    AMPICILLIN <=2 SENSITIVE Sensitive     CEFAZOLIN <=4 SENSITIVE Sensitive     CEFTRIAXONE <=0.25 SENSITIVE Sensitive     CIPROFLOXACIN >=4 RESISTANT Resistant     GENTAMICIN <=1 SENSITIVE Sensitive     IMIPENEM <=0.25 SENSITIVE Sensitive     NITROFURANTOIN <=16 SENSITIVE Sensitive     TRIMETH/SULFA <=20 SENSITIVE Sensitive     AMPICILLIN/SULBACTAM <=2 SENSITIVE Sensitive     PIP/TAZO <=4 SENSITIVE Sensitive     * >=100,000 COLONIES/mL ESCHERICHIA COLI  Respiratory Panel by RT PCR (Flu A&B, Covid) - Nasopharyngeal Swab     Status: None   Collection Time: 05/30/2020  8:46 PM   Specimen: Nasopharyngeal Swab  Result Value Ref Range Status    SARS Coronavirus 2 by RT PCR NEGATIVE NEGATIVE Final    Comment: (NOTE) SARS-CoV-2 target nucleic acids are NOT DETECTED.  The SARS-CoV-2 RNA is generally detectable in upper respiratoy specimens during the acute phase of infection.  The lowest concentration of SARS-CoV-2 viral copies this assay can detect is 131 copies/mL. A negative result does not preclude SARS-Cov-2 infection and should not be used as the sole basis for treatment or other patient management decisions. A negative result may occur with  improper specimen collection/handling, submission of specimen other than nasopharyngeal swab, presence of viral mutation(s) within the areas targeted by this assay, and inadequate number of viral copies (<131 copies/mL). A negative result must be combined with clinical observations, patient history, and epidemiological information. The expected result is Negative.  Fact Sheet for Patients:  PinkCheek.be  Fact Sheet for Healthcare Providers:  GravelBags.it  This test is no t yet approved or cleared by the [place] FDA and  has been authorized for detection and/or diagnosis of SARS-CoV-2 by FDA under an Emergency Use Authorization (EUA). This EUA will remain  in effect (meaning this test can be used) for the duration of the COVID-19 declaration under Section 564(b)(1) of the Act, 21 U.S.C. section 360bbb-3(b)(1), unless the authorization is terminated or revoked sooner.     Influenza A by PCR NEGATIVE NEGATIVE Final   Influenza B by PCR NEGATIVE NEGATIVE Final    Comment: (NOTE) The Xpert Xpress SARS-CoV-2/FLU/RSV assay is intended as an aid in  the diagnosis of influenza from Nasopharyngeal swab specimens and  should not be used as a sole basis for treatment. Nasal washings and  aspirates are unacceptable for Xpert Xpress SARS-CoV-2/FLU/RSV  testing.  Fact Sheet for  Patients: PinkCheek.be  Fact Sheet for Healthcare Providers: GravelBags.it  This test is not yet approved or cleared by the [place] FDA and  has been authorized for detection and/or diagnosis of SARS-CoV-2 by  FDA under an Emergency Use Authorization (EUA). This EUA will remain  in effect (meaning this test can be used) for the duration of the  Covid-19 declaration under Section 564(b)(1) of the Act, 21  U.S.C. section 360bbb-3(b)(1), unless the authorization is  terminated or revoked. Performed at [facility], [address], [place], [place] [address]   Urine Culture     Status: Abnormal   Collection Time: 06/23/20  3:07 PM   Specimen: Urine, Random  Result Value Ref Range Status   Specimen Description   Final    URINE, RANDOM Performed at [facility], [address] [place], [place] [address]    Special Requests   Final    NONE Performed at [facility], [address] [place],  [address]    Culture MULTIPLE SPECIES PRESENT, SUGGEST RECOLLECTION (A)  Final   Report Status 06/25/2020 FINAL  Final     Radiology Studies: No results found.  Scheduled Meds: . acidophilus  1 capsule Oral Daily  . apixaban  2.5 mg Oral BID  . diltiazem  300 mg Oral Daily  . escitalopram  10 mg Oral Daily  . feeding supplement (ENSURE ENLIVE)  237 mL Oral BID BM  . fluticasone  2 spray Each Nare Daily  . furosemide  20 mg Intravenous Q12H  . insulin aspart  0-9 Units Subcutaneous TID WC  . magic mouthwash  15 mL Oral TID  . metoprolol succinate  25 mg Oral Daily  . mometasone-formoterol  2 puff Inhalation BID  . multivitamin with minerals  1 tablet Oral Daily  . pantoprazole  40 mg Oral Daily  . potassium chloride  20 mEq Oral Daily  . predniSONE  40 mg Oral Q breakfast   Continuous Infusions:   LOS: 8 days  Time spent: 25 minutes.  Lorella Nimrod, MD [facility]  If  7PM-7AM, please contact night-coverage [email or web address]  06/29/2020, 1:44 PM   This record has been created using Systems analyst. Errors have been sought and corrected,but may not always be located. Such creation errors do not reflect on the standard of care.

## 2020-06-29 NOTE — Progress Notes (Signed)
Physical Therapy Treatment Patient Details Name: Eric Li MRN: [id number] DOB: Jun 13, 1935 Today's Date: 06/29/2020    History of Present Illness Eric Li is an 85 y/o male who was admitted with chief complaint of abdominal pain and confusion. Pt found to have a UTI. PMH includes paroxysmal A-fib on Apixaban, HTN, HLD, CAD, CHF, COPD, pulmonary fibrosis with chronic respiratory failure, on home O2 at 3L, bilateral hearing loss, nephrolitiasis, SOB on exertion, and statin intolerance.    PT Comments    Patient presents EOB on arrival to room with OT finishing up. Pt performed exercises sitting EOB before needing to lay supine in bed due fatigue. He is maxA+2 returning to bed and rolling to straight up bed. He performed supine exercises and encouraged to perform throughout his day. Monitored O2 sat throughout session and stayed between 92-96% on 45% 45L/min HFNC. He is left in bed with all needs and family bedside. Patient is progressing towards goals. Pt will benefit from skilled PT services to address deficits in strength, balance, and decrease risk for future falls.    Follow Up Recommendations  SNF;Supervision for mobility/OOB     Equipment Recommendations  Rolling walker with 5" wheels;Other (comment) (unclear if patient has one)    Recommendations for Other Services       Precautions / Restrictions Precautions Precautions: Fall Restrictions Weight Bearing Restrictions: No    Mobility  Bed Mobility Overal bed mobility: Needs Assistance Bed Mobility: Sit to Supine;Rolling Rolling: Mod assist   Supine to sit: Max assist;HOB elevated Sit to supine: Max assist;+2 for physical assistance   General bed mobility comments: MAX A x2 to scoot higher in bed c draw sheet. MOD A + VCs for L rolling  Transfers                 General transfer comment: Deferred - pt faitgued and increased HR in sitting  Ambulation/Gait             General Gait Details: Did not  assess due to safety   Stairs             Wheelchair Mobility    Modified Rankin (Stroke Patients Only)       Balance Overall balance assessment: Needs assistance Sitting-balance support: Bilateral upper extremity supported;Feet supported Sitting balance-Leahy Scale: Poor Sitting balance - Comments: pt with strong posterior lean with edge of bed sitting requiring MAX A for upright balance. Able to bring self upright c cues but unable to maintain Postural control: Posterior lean     Standing balance comment: Did not assess due to safety                            Cognition Arousal/Alertness: Awake/alert Behavior During Therapy: WFL for tasks assessed/performed Overall Cognitive Status: History of cognitive impairments - at baseline                                        Exercises General Exercises - Lower Extremity Ankle Circles/Pumps: AROM;Both;10 reps;Supine Long Arc Quad: AROM;Both;Seated;5 reps Hip ABduction/ADduction: AROM;Both;Supine;Other reps (comment) (8 reps) Straight Leg Raises: AROM;Both;Supine;5 reps Hip Flexion/Marching: AROM;Both;Seated;5 reps    General Comments        Pertinent Vitals/Pain Pain Assessment: No/denies pain    Home Living  Prior Function            PT Goals (current goals can now be found in the care plan section) Acute Rehab PT Goals Patient Stated Goal: to go home - not to go to a facility unless absolutely necessary PT Goal Formulation: With family Time For Goal Achievement: 07/11/20 Potential to Achieve Goals: Fair Progress towards PT goals: Progressing toward goals    Frequency    Min 2X/week      PT Plan Current plan remains appropriate    Co-evaluation              AM-PAC PT "6 Clicks" Mobility   Outcome Measure  Help needed turning from your back to your side while in a flat bed without using bedrails?: A Lot Help needed moving from lying  on your back to sitting on the side of a flat bed without using bedrails?: A Lot Help needed moving to and from a bed to a chair (including a wheelchair)?: A Lot Help needed standing up from a chair using your arms (e.g., wheelchair or bedside chair)?: A Lot Help needed to walk in hospital room?: A Lot Help needed climbing 3-5 steps with a railing? : A Lot 6 Click Score: 12    End of Session Equipment Utilized During Treatment: Oxygen Activity Tolerance: Patient tolerated treatment well;Patient limited by fatigue Patient left: in bed;with call bell/phone within reach;with bed alarm set;with family/visitor present Nurse Communication: Mobility status PT Visit Diagnosis: Unsteadiness on feet (R26.81);Other abnormalities of gait and mobility (R26.89);Repeated falls (R29.6);Muscle weakness (generalized) (M62.81);History of falling (Z91.81)     Time: 8206-0156 PT Time Calculation (min) (ACUTE ONLY): 12 min  Charges:  $Therapeutic Exercise: 8-22 mins                      Eric Li, SPT Bernita Raisin 06/29/2020, 4:20 PM

## 2020-06-29 NOTE — Evaluation (Addendum)
Clinical/Bedside Swallow Evaluation Patient Details  Name: Eric Li MRN: [id number] Date of Birth: 10-Nov-1934  Today's Date: 06/29/2020 Time: SLP Start Time (ACUTE ONLY): 44 SLP Stop Time (ACUTE ONLY): 1330 SLP Time Calculation (min) (ACUTE ONLY): 60 min  Past Medical History:  Past Medical History:  Diagnosis Date  . Bilateral hearing loss 03/03/2016  . CAD (coronary artery disease) 02/24/2014  . CHF (congestive heart failure) ([place])   . COPD, moderate ([place]) 08/29/2015  . Hemorrhoids 02/24/2014  . HTN (hypertension) 02/24/2014  . Hyperlipidemia, unspecified 02/24/2014  . Nephrolithiasis   . SOB (shortness of breath) on exertion 03/27/2015  . Statin intolerance 03/30/2015   Past Surgical History:  Past Surgical History:  Procedure Laterality Date  . CHOLECYSTECTOMY    . TONSILLECTOMY     HPI:  Pt is an 85 y/o male who was admitted with chief complaint of abdominal pain and confusion. Pt found to have a UTI. PMH includes paroxysmal A-fib on Apixaban, HTN, HLD, CAD, CHF, COPD, pulmonary fibrosis with chronic respiratory failure, on home O2 at 3L, bilateral hearing loss, nephrolitiasis, SOB on exertion, and statin intolerance.  At Baseline, pt has  COPD and pulmonary fibrosis with chronic respiratory failure on home O2 at 3 L, who was brought to the emergency room by EMS because of confusion.  Chest x-ray showed no acute findings, head CT no acute findings.  Pt had worsening dyspnea with acute on chronic hypoxemic respiratory failure; now on HFNC down to 45% 45L/min in HFNC which is significantly better then previous needs.  MD concerned for Dementia as pt has episodes of sundowning and confusion/disorientation; Family endorsed similar presentation for 1/2 year now per MD note.  Repeat CXR revealed "Improved aeration at the left lung base since 06/25/2020. No new findings" (completed 06/27/2020) post concern of aspiration one evening per Daughter.   Assessment / Plan / Recommendation Clinical  Impression  Pt appears to present w/ grossly adequate oropharyngeal phase swallow function w/ No immediate, overt oropharyngeal phase dysphagia noted, No neuromuscular deficits noted.Pt is lacking Bottom Dentition impacting ability to effectively masticate solid foods. Pt consumed po trials w/ No immediate overt, clinical s/s of aspiration during po trials. However, he has signficantly declined Pulmonary status and requires HFNC O2 support at 40-45% to maintain O2 sats; pt has severe COPD, pulmonary fibrosis and Emphysema. ANY decline in Pulmonary status at Baseline can impact timing of Apnea moment during swallowing thus increase risk for aspiration. He appears at reduced risk for aspiration when following general aspiration precautions and Modifying the Diet to Puree consistency for conservation of energy. Pt's Dtr endorsed declined oral intake prior to coming into the hospital; difficulty w/ mastication of solids/meats d/t no Bottom Dentition, effort. He often "sipped on" an Ensure drink. He has currently been tolerating the Puree foods in his diet per the Daughter (mashed potatoes, pureed fruit) w/ No difficulty but not eating the Minced foods "really".  During po trials, pt consumed ice chip, thin liquid VIA CUP, puree and broken solids moistened consistencies w/ no immediate, overt coughing, decline in vocal quality, or change in respiratory presentation during/post trials -- O2 sats remained 94%, RR 24-27, HR elevated. Oral phase appeared grossly [facility] w/ timely bolus management, mastication, and control of bolus propulsion for A-P transfer for swallowing. Oral clearing achieved w/ all trial consistencies given min Time w/ the increased texture. Rest Breaks were given b/t trials to lessen WOB/SOB during the exertion of tasks. OM Exam appeared [facility] w/ no unilateral weakness  noted. Speech Clear. Verbal/tactile cues given for Cognitive Awareness support. Pt fed self Holding Cup to Drink liquids for safety; he  was fed other trials by SLP for conservation of energy. Post discussion w/ Daughter present (in light of medical, Pulmonary, and Cognitive presentations), recommend a PUREE consistency diet w/ gravies to moisten foods; Thin liquidsVIA CUP - d/t Pulmonary status. Recommend general aspiration precautions, Pills CRUSHED in Puree for safer, easier swallowing, Rest Breaks during meals to conserve energy. Education given on aspiration precautions/positioning Upright for po's; Pills in Puree; food consistencies(puree) and easy to eat options; Rest Breaks. Daughter agreed. NSG to reconsult if any new needs arise. NSG updated, agreed.  SLP Visit Diagnosis: Dysphagia, oral phase (R13.11)    Aspiration Risk  Mild aspiration risk;Risk for inadequate nutrition/hydration (reduced following precautions)    Diet Recommendation  Puree diet w/ gravies(dysphagia level 1); Thin liquids VIA CUP ONLY. Aspiration precautions; Rest Breaks and Feeding Support -- diet and precautions for conservation of energy. Pt recommended to Hold Cup to Drink for safer swallowing; support w/ feeding of foods.   Medication Administration: Crushed with puree (as able for ease of swallowing)    Other  Recommendations Recommended Consults:  (Dietician f/u for support) Oral Care Recommendations: Oral care BID;Oral care before and after PO;Staff/trained caregiver to provide oral care Other Recommendations:  (n/a)   Follow up Recommendations Home health SLP (TBD for f/u at Home if needs indicate)      Frequency and Duration  (n/a)   (n/a)       Prognosis Prognosis for Safe Diet Advancement: Fair Barriers to Reach Goals: Cognitive deficits;Time post onset;Severity of deficits (Deconditioning)      Swallow Study   General Date of Onset: 06/16/2020 HPI: Pt is an 85 y/o male who was admitted with chief complaint of abdominal pain and confusion. Pt found to have a UTI. PMH includes paroxysmal A-fib on Apixaban, HTN, HLD, CAD, CHF, COPD,  pulmonary fibrosis with chronic respiratory failure, on home O2 at 3L, bilateral hearing loss, nephrolitiasis, SOB on exertion, and statin intolerance.  At Baseline, pt has  COPD and pulmonary fibrosis with chronic respiratory failure on home O2 at 3 L, who was brought to the emergency room by EMS because of confusion.  Chest x-ray showed no acute findings, head CT no acute findings.  Pt had worsening dyspnea with acute on chronic hypoxemic respiratory failure; now on HFNC down to 45% 45L/min in HFNC which is significantly better then previous needs.  MD concerned for Dementia as pt has episodes of sundowning and confusion/disorientation; Family endorsed similar presentation for 1/2 year now per MD note.  Repeat CXR revealed "Improved aeration at the left lung base since 06/25/2020. No new findings" (completed 06/27/2020) post concern of aspiration one evening per Daughter. Type of Study: Bedside Swallow Evaluation Previous Swallow Assessment: none reported Diet Prior to this Study: Dysphagia 2 (chopped);Thin liquids (similar diet at home) Temperature Spikes Noted: No (wbc elevated) Respiratory Status: Nasal cannula (HFNC - 40%) History of Recent Intubation: No Behavior/Cognition: Alert;Cooperative;Pleasant mood;Distractible;Requires cueing (HOH) Oral Cavity Assessment: Dry (sticky) Oral Care Completed by SLP: Recent completion by staff Oral Cavity - Dentition:  (Top dentition; no bottom dentition) Vision: Functional for self-feeding Self-Feeding Abilities: Able to feed self;Needs assist;Needs set up;Total assist (fatigues easily) Patient Positioning: Upright in bed (needed full positioning support) Baseline Vocal Quality: Normal (adequate) Volitional Cough: Strong Volitional Swallow: Able to elicit    Oral/Motor/Sensory Function Overall Oral Motor/Sensory Function: Within functional limits   Ice  Chips Ice chips: Within functional limits Presentation: Spoon (fed; 4 trials)   Thin Liquid Thin  Liquid: Within functional limits Presentation: Cup;Self Fed (7 trials) Other Comments: declined further    Nectar Thick Nectar Thick Liquid: Not tested   Honey Thick Honey Thick Liquid: Not tested   Puree Puree: Within functional limits Presentation: Spoon (fed; 4 trials) Other Comments: declined further   Solid     Solid: Impaired Presentation: Spoon (fed; 1 trial) Oral Phase Impairments: Impaired mastication (lacking bottom dentition; SOB) Oral Phase Functional Implications:  (adequate) Pharyngeal Phase Impairments:  (none) Other Comments: declined further        Orinda Kenner, MS, CCC-SLP Speech Language Pathologist Rehab Services [phone] Jan Olano 06/29/2020,1:57 PM

## 2020-06-30 DIAGNOSIS — J9611 Chronic respiratory failure with hypoxia: Secondary | ICD-10-CM | POA: Diagnosis not present

## 2020-06-30 LAB — BASIC METABOLIC PANEL
Anion gap: 9 (ref 5–15)
BUN: 22 mg/dL (ref 8–23)
CO2: 29 mmol/L (ref 22–32)
Calcium: 8.3 mg/dL — ABNORMAL LOW (ref 8.9–10.3)
Chloride: 94 mmol/L — ABNORMAL LOW (ref 98–111)
Creatinine, Ser: 0.79 mg/dL (ref 0.61–1.24)
GFR, Estimated: >60 mL/min (ref >60)
Glucose, Bld: 181 mg/dL — ABNORMAL HIGH (ref 70–99)
Potassium: 3.5 mmol/L (ref 3.5–5.1)
Sodium: 132 mmol/L — ABNORMAL LOW (ref 135–145)

## 2020-06-30 LAB — GLUCOSE, CAPILLARY
Glucose-Capillary: 131 mg/dL — ABNORMAL HIGH (ref 70–99)
Glucose-Capillary: 183 mg/dL — ABNORMAL HIGH (ref 70–99)
Glucose-Capillary: 198 mg/dL — ABNORMAL HIGH (ref 70–99)
Glucose-Capillary: 154 mg/dL — ABNORMAL HIGH (ref 70–99)

## 2020-06-30 MED ORDER — POLYETHYLENE GLYCOL 3350 17 G PO PACK
34.0000 g | PACK | Freq: Two times a day (BID) | ORAL | Status: DC
  Administered 2020-06-30 – 2020-07-08 (×9): 34 g via ORAL
  Filled 2020-06-30 (×11): qty 2

## 2020-06-30 NOTE — Plan of Care (Signed)
  Problem: Clinical Measurements: Goal: Respiratory complications will improve Outcome: Progressing   Problem: Safety: Goal: Ability to remain free from injury will improve Outcome: Progressing   

## 2020-06-30 NOTE — Progress Notes (Signed)
PROGRESS NOTE    Eric Li  HCW:[id number] DOB: May 19, 1935 DOA: 06/10/2020 PCP: [name], MD   Brief Narrative: Taken from prior notes. Eric Li a 85 year old male with history of paroxysmal A. fib on apixaban, HTN, CAD, frequent UTIs, COPD and pulmonary fibrosis with chronic respiratory failure on home O2 at 3 L presenting with altered mental status secondary to UTI.  Completed treatment with ceftriaxone. Developed acute on chronic hypoxic respiratory failure secondary to exacerbation of his pulmonary fibrosis requiring heated HFNC. Pulmonary was consulted.  Subjective: Pt reported cough better.  No significant dyspnea.  Ate breakfast.  Ready for a nap.   Assessment & Plan:   Principal Problem:   Acute metabolic encephalopathy Active Problems:   CAD (coronary artery disease)   HTN (hypertension)   UTI (urinary tract infection)   PAF (paroxysmal atrial fibrillation) (HCC)   Pulmonary fibrosis (HCC)   Chronic respiratory failure with hypoxia (HCC)   COPD, severe (HCC)   Abnormal computed tomography angiography (CTA) of abdomen and pelvis   Goals of care, counseling/discussion   Palliative care by specialist   DNR (do not resuscitate)  Acute on chronic respiratory failure with hypoxia.   secondary to pulmonary fibrosis exacerbation.   Chronic hypoxic respiratory failure on 3-4L O2 baseline Pulmonary was consulted and they started him on steroid.  Chest x-ray done on 06/25/2020 with left basilar opacity with concern of aspiration pneumonia.  Patient completed a course with ceftriaxone.  Subsequent chest x-ray with some improvement in that infiltrate. Remains on heated HFNC at 40 L and 40% of FiO2.  Baseline oxygen requirement of 3-4 L. PLAN: --continue steroid taper as prednisone 40 mg daily --wean O2 as tolerated -Continue with DuoNeb. -Continue with MetaNeb. -continue IV lasix 20 mg BID  Dysphagia.  Daughter was concerned that he is having difficulty  swallowing the food which he was used to take it without difficulty before.  Patient does not have his teeth. -Swallow evaluation  PLAN: --Dys1 diet, per SLP rec  Sepsis secondary to UTI.  Resolved. Initially met sepsis criteria with UTI.  Urine culture grew E. coli which were pretty pansensitive except quinolones.  Completed a course of ceftriaxone.  Encephalopathy.  Some waxing and waning in his mental status. High risk for delirium and sundowning secondary to his age and underlying dementia. He was alert and oriented to self only, able to recognize daughter and communicate with her. -Dementia precautions. -Continue to monitor. -Palliative care was also consulted-patient is DNR and will continue current scope of care.  Oral thrush.  Oral thrush was noted on 06/27/20.  Patient is on steroid.  Seems improving. -Continue with Magic mouthwash.  History of coronary artery disease.  No chest pain or acute EKG changes. -Continue with home dose of metoprolol, nitroglycerin. -Currently not on Zetia.  Hypertension.   BP 90's-120's --cont cardizem and Toprol since pt needs it for rate control --continue IV lasix for diuresis  Paroxysmal atrial fibrillation.  Patient was stable in 60s heart rate when seen this morning.  Later converted to A. fib with RVR and sustaining heart rate in 120s. --HR variable PLAN: --continue cardizem 360 mg daily and Toprol 25 mg daily -Continue with Eliquis.  Incidental abnormal computed tomography angiography (CTA) of abdomen and pelvis -Followed by MRI, no mass was identified -MRI of abdomen pelvis completed on 06/23/2020 -no comments on sigmoid mass -GI consult has been canceled.  Hx of pre-diabetes Hyperglycemia 2/2 steroid use --SSI   Objective: Vitals:  06/30/20 0600 06/30/20 0800 06/30/20 0900 06/30/20 1000  BP:  96/60 105/71 105/67  Pulse: (!) 119 61 66 61  Resp: (!) 27 20 (!) 22 (!) 23  Temp:  97.6 F (36.4 C)  97.7 F (36.5 C)  TempSrc:   Axillary  Axillary  SpO2: (!) 76% 95% 95% 95%  Weight:      Height:        Intake/Output Summary (Last 24 hours) at 06/30/2020 1333 Last data filed at 06/30/2020 1111 Gross per 24 hour  Intake 540 ml  Output 1975 ml  Net -1435 ml   Filed Weights   06/28/20 0319 06/29/20 0441 06/30/20 0444  Weight: 53.2 kg 53.8 kg 53.9 kg    Examination: Constitutional: NAD, alert, oriented HEENT: conjunctivae and lids normal, EOMI CV: RRR no M,R,G. Distal pulses +2.  No cyanosis.   RESP: heated hf, 40% and 40L GI: +BS, NTND Extremities: No effusions, edema in BLE SKIN: warm, dry and intact Neuro: II - XII grossly intact.  Sensation intact Psych: Normal mood and affect.     DVT prophylaxis: Eliquis Code Status: DNR Family Communication: Daughter was updated at bedside today Disposition Plan:  Status is: Inpatient  Remains inpatient appropriate because:Inpatient level of care appropriate due to severity of illness   Dispo: The patient is from: Home              Anticipated d/c is to: SNF              Anticipated d/c date is: > 3 days              Patient currently is not medically stable to d/c.  On heated hf.  Patient is high risk for deterioration and death secondary to advanced age and multiple underlying comorbidities.  Palliative care has been consulted.  Patient is now DNR.   Consultants:   Pulmonary  Procedures:  Antimicrobials:   Data Reviewed: I have personally reviewed following labs and imaging studies  CBC: Recent Labs  Lab 06/25/20 0439 06/28/20 0609  WBC 14.5* 16.8*  HGB 15.1 14.7  HCT 44.4 44.9  MCV 87.9 90.7  PLT 210 536   Basic Metabolic Panel: Recent Labs  Lab 06/25/20 0439 06/25/20 0439 06/26/20 0359 06/27/20 0548 06/28/20 0609 06/29/20 0455 06/30/20 0432  NA 139   < > 135 134* 136 134* 132*  K 3.8   < > 3.8 4.1 3.8 3.6 3.5  CL 104   < > 99 95* 98 96* 94*  CO2 24   < > _0 GLUCOSE 231*   < > 214* 138* 174* 119* 181*  BUN 18   <  > 28* 27* 30* 31* 22  CREATININE 0.67   < > 0.71 0.70 0.68 0.72 0.79  CALCIUM 8.6*   < > 8.7* 9.2 8.7* 8.4* 8.3*  MG 2.6*  --   --   --   --   --   --   PHOS  --   --  3.4  --   --   --   --    < > = values in this interval not displayed.   GFR: Estimated Creatinine Clearance: 53.3 mL/min (by C-G formula based on SCr of 0.79 mg/dL). Liver Function Tests: No results for input(s): AST, ALT, ALKPHOS, BILITOT, PROT, ALBUMIN in the last 168 hours. No results for input(s): LIPASE, AMYLASE in the last 168 hours. No results for input(s): AMMONIA in the last 168 hours.  Coagulation Profile: No results for input(s): INR, PROTIME in the last 168 hours. Cardiac Enzymes: No results for input(s): CKTOTAL, CKMB, CKMBINDEX, TROPONINI in the last 168 hours. BNP (last 3 results) No results for input(s): PROBNP in the last 8760 hours. HbA1C: No results for input(s): HGBA1C in the last 72 hours. CBG: Recent Labs  Lab 06/29/20 1156 06/29/20 1637 06/29/20 2009 06/30/20 0752 06/30/20 1217  GLUCAP 151* 217* 171* 131* 183*   Lipid Profile: No results for input(s): CHOL, HDL, LDLCALC, TRIG, CHOLHDL, LDLDIRECT in the last 72 hours. Thyroid Function Tests: No results for input(s): TSH, T4TOTAL, FREET4, T3FREE, THYROIDAB in the last 72 hours. Anemia Panel: No results for input(s): VITAMINB12, FOLATE, FERRITIN, TIBC, IRON, RETICCTPCT in the last 72 hours. Sepsis Labs: No results for input(s): PROCALCITON, LATICACIDVEN in the last 168 hours.  Recent Results (from the past 240 hour(s))  Culture, blood (Routine x 2)     Status: None   Collection Time: 06/14/2020  1:07 PM   Specimen: BLOOD LEFT ARM  Result Value Ref Range Status   Specimen Description BLOOD LEFT ARM  Final   Special Requests   Final    BOTTLES DRAWN AEROBIC AND ANAEROBIC Blood Culture results may not be optimal due to an excessive volume of blood received in culture bottles   Culture   Final    NO GROWTH 5 DAYS Performed at [facility], [address] [place], [place] [address]    Report Status 06/26/2020 FINAL  Final  Culture, blood (Routine x 2)     Status: None   Collection Time: 06/14/2020  8:37 PM   Specimen: BLOOD  Result Value Ref Range Status   Specimen Description BLOOD RIGHT ANTECUBITAL  Final   Special Requests   Final    BOTTLES DRAWN AEROBIC AND ANAEROBIC Blood Culture adequate volume   Culture   Final    NO GROWTH 5 DAYS Performed at [facility], [address] [place], [place] [address]    Report Status 06/26/2020 FINAL  Final  Urine Culture     Status: Abnormal   Collection Time: 06/07/2020  8:37 PM   Specimen: Urine, Random  Result Value Ref Range Status   Specimen Description   Final    URINE, RANDOM Performed at [facility], [address] [place], [place] [address]    Special Requests   Final    NONE Performed at [facility], [address] [place], [place] [address]    Culture >=100,000 COLONIES/mL ESCHERICHIA COLI (A)  Final   Report Status 06/23/2020 FINAL  Final   Organism ID, Bacteria ESCHERICHIA COLI (A)  Final      Susceptibility   Escherichia coli - MIC*    AMPICILLIN <=2 SENSITIVE Sensitive     CEFAZOLIN <=4 SENSITIVE Sensitive     CEFTRIAXONE <=0.25 SENSITIVE Sensitive     CIPROFLOXACIN >=4 RESISTANT Resistant     GENTAMICIN <=1 SENSITIVE Sensitive     IMIPENEM <=0.25 SENSITIVE Sensitive     NITROFURANTOIN <=16 SENSITIVE Sensitive     TRIMETH/SULFA <=20 SENSITIVE Sensitive     AMPICILLIN/SULBACTAM <=2 SENSITIVE Sensitive     PIP/TAZO <=4 SENSITIVE Sensitive     * >=100,000 COLONIES/mL ESCHERICHIA COLI  Respiratory Panel by RT PCR (Flu A&B, Covid) - Nasopharyngeal Swab     Status: None   Collection Time: 05/30/2020  8:46 PM   Specimen: Nasopharyngeal Swab  Result Value Ref Range Status   SARS Coronavirus 2 by RT PCR  NEGATIVE NEGATIVE Final    Comment: (NOTE) SARS-CoV-2 target nucleic acids are NOT DETECTED.  The SARS-CoV-2  RNA is generally detectable in upper respiratoy specimens during the acute phase of infection. The lowest concentration of SARS-CoV-2 viral copies this assay can detect is 131 copies/mL. A negative result does not preclude SARS-Cov-2 infection and should not be used as the sole basis for treatment or other patient management decisions. A negative result may occur with  improper specimen collection/handling, submission of specimen other than nasopharyngeal swab, presence of viral mutation(s) within the areas targeted by this assay, and inadequate number of viral copies (<131 copies/mL). A negative result must be combined with clinical observations, patient history, and epidemiological information. The expected result is Negative.  Fact Sheet for Patients:  PinkCheek.be  Fact Sheet for Healthcare Providers:  GravelBags.it  This test is no t yet approved or cleared by the [place] FDA and  has been authorized for detection and/or diagnosis of SARS-CoV-2 by FDA under an Emergency Use Authorization (EUA). This EUA will remain  in effect (meaning this test can be used) for the duration of the COVID-19 declaration under Section 564(b)(1) of the Act, 21 U.S.C. section 360bbb-3(b)(1), unless the authorization is terminated or revoked sooner.     Influenza A by PCR NEGATIVE NEGATIVE Final   Influenza B by PCR NEGATIVE NEGATIVE Final    Comment: (NOTE) The Xpert Xpress SARS-CoV-2/FLU/RSV assay is intended as an aid in  the diagnosis of influenza from Nasopharyngeal swab specimens and  should not be used as a sole basis for treatment. Nasal washings and  aspirates are unacceptable for Xpert Xpress SARS-CoV-2/FLU/RSV  testing.  Fact Sheet for Patients: PinkCheek.be  Fact Sheet for Healthcare Providers: GravelBags.it  This test is not yet approved or cleared by the  [place] FDA and  has been authorized for detection and/or diagnosis of SARS-CoV-2 by  FDA under an Emergency Use Authorization (EUA). This EUA will remain  in effect (meaning this test can be used) for the duration of the  Covid-19 declaration under Section 564(b)(1) of the Act, 21  U.S.C. section 360bbb-3(b)(1), unless the authorization is  terminated or revoked. Performed at [facility], [address], [place], [place] [address]   Urine Culture     Status: Abnormal   Collection Time: 06/23/20  3:07 PM   Specimen: Urine, Random  Result Value Ref Range Status   Specimen Description   Final    URINE, RANDOM Performed at [facility], [address], [place], [place] [address]    Special Requests   Final    NONE Performed at [facility], [address] [place], [place] [address]    Culture MULTIPLE SPECIES PRESENT, SUGGEST RECOLLECTION (A)  Final   Report Status 06/25/2020 FINAL  Final     Radiology Studies: No results found.  Scheduled Meds: . acidophilus  1 capsule Oral Daily  . apixaban  2.5 mg Oral BID  . diltiazem  360 mg Oral Daily  . escitalopram  10 mg Oral Daily  . feeding supplement (ENSURE ENLIVE)  237 mL Oral BID BM  . fluticasone  2 spray Each Nare Daily  . furosemide  20 mg Intravenous Q12H  . insulin aspart  0-9 Units Subcutaneous TID WC  . magic mouthwash  15 mL Oral TID  . metoprolol succinate  25 mg Oral Daily  . mometasone-formoterol  2 puff Inhalation BID  . multivitamin with minerals  1 tablet Oral Daily  .  pantoprazole  40 mg Oral Daily  . potassium chloride  20 mEq Oral Daily  . predniSONE  40 mg Oral Q breakfast   Continuous Infusions:   LOS: 9 days    Enzo Bi, MD [facility]  If 7PM-7AM, please contact night-coverage [email or web address]  06/30/2020, 1:33 PM

## 2020-06-30 NOTE — Plan of Care (Signed)
  Problem: Safety: Goal: Ability to remain free from injury will improve Outcome: Progressing   

## 2020-07-01 DIAGNOSIS — J9611 Chronic respiratory failure with hypoxia: Secondary | ICD-10-CM | POA: Diagnosis not present

## 2020-07-01 LAB — GLUCOSE, CAPILLARY
Glucose-Capillary: 120 mg/dL — ABNORMAL HIGH (ref 70–99)
Glucose-Capillary: 148 mg/dL — ABNORMAL HIGH (ref 70–99)
Glucose-Capillary: 142 mg/dL — ABNORMAL HIGH (ref 70–99)
Glucose-Capillary: 319 mg/dL — ABNORMAL HIGH (ref 70–99)

## 2020-07-01 LAB — CBC
HCT: 46.2 % (ref 39.0–52.0)
Hemoglobin: 15.3 g/dL (ref 13.0–17.0)
MCH: 29.6 pg (ref 26.0–34.0)
MCHC: 33.1 g/dL (ref 30.0–36.0)
MCV: 89.4 fL (ref 80.0–100.0)
Platelets: 257 10*3/uL (ref 150–400)
RBC: 5.17 MIL/uL (ref 4.22–5.81)
RDW: 16.4 % — ABNORMAL HIGH (ref 11.5–15.5)
WBC: 22.1 10*3/uL — ABNORMAL HIGH (ref 4.0–10.5)
nRBC: 0 % (ref 0.0–0.2)

## 2020-07-01 LAB — BASIC METABOLIC PANEL
Anion gap: 12 (ref 5–15)
BUN: 27 mg/dL — ABNORMAL HIGH (ref 8–23)
CO2: 28 mmol/L (ref 22–32)
Calcium: 8.7 mg/dL — ABNORMAL LOW (ref 8.9–10.3)
Chloride: 97 mmol/L — ABNORMAL LOW (ref 98–111)
Creatinine, Ser: 0.75 mg/dL (ref 0.61–1.24)
GFR, Estimated: >60 mL/min (ref >60)
Glucose, Bld: 117 mg/dL — ABNORMAL HIGH (ref 70–99)
Potassium: 3.3 mmol/L — ABNORMAL LOW (ref 3.5–5.1)
Sodium: 137 mmol/L (ref 135–145)

## 2020-07-01 LAB — MAGNESIUM: Magnesium: 2.4 mg/dL (ref 1.7–2.4)

## 2020-07-01 MED ORDER — POTASSIUM CHLORIDE 20 MEQ/15ML (10%) PO SOLN
40.0000 meq | Freq: Every day | ORAL | Status: DC
  Administered 2020-07-01 – 2020-07-03 (×3): 40 meq via ORAL
  Filled 2020-07-01 (×3): qty 30

## 2020-07-01 MED ORDER — FUROSEMIDE 10 MG/ML IJ SOLN
20.0000 mg | Freq: Two times a day (BID) | INTRAMUSCULAR | Status: DC
  Administered 2020-07-01 – 2020-07-06 (×10): 20 mg via INTRAVENOUS
  Filled 2020-07-01 (×10): qty 2

## 2020-07-01 MED ORDER — NYSTATIN 100000 UNIT/GM EX OINT
TOPICAL_OINTMENT | Freq: Two times a day (BID) | CUTANEOUS | Status: DC
  Filled 2020-07-01: qty 15

## 2020-07-01 NOTE — Plan of Care (Signed)

## 2020-07-01 NOTE — Progress Notes (Addendum)
PROGRESS NOTE    ALBION WEATHERHOLTZ  JEH:[id number] DOB: 10/26/1934 DOA: 06/09/2020 PCP: [name], MD   Brief Narrative: Taken from prior notes. OSCEOLA DEPAZ a 85 year old male with history of paroxysmal A. fib on apixaban, HTN, CAD, frequent UTIs, COPD and pulmonary fibrosis with chronic respiratory failure on home O2 at 3 L presenting with altered mental status secondary to UTI.  Completed treatment with ceftriaxone. Developed acute on chronic hypoxic respiratory failure secondary to exacerbation of his pulmonary fibrosis requiring heated HFNC. Pulmonary was consulted.  Subjective: Pt reported no appetite, eating very little.  No BM yet.  O2 requirement coming down, asked nursing to switch to High Flow [place].   Assessment & Plan:   Principal Problem:   Acute metabolic encephalopathy Active Problems:   CAD (coronary artery disease)   HTN (hypertension)   UTI (urinary tract infection)   PAF (paroxysmal atrial fibrillation) (HCC)   Pulmonary fibrosis (HCC)   Chronic respiratory failure with hypoxia (HCC)   COPD, severe (HCC)   Abnormal computed tomography angiography (CTA) of abdomen and pelvis   Goals of care, counseling/discussion   Palliative care by specialist   DNR (do not resuscitate)  Acute on chronic respiratory failure with hypoxia.   secondary to pulmonary fibrosis exacerbation.   Chronic hypoxic respiratory failure on 3-4L O2 baseline Pulmonary was consulted and they started him on steroid.  Chest x-ray done on 06/25/2020 with left basilar opacity with concern of aspiration pneumonia.  Patient completed a course with ceftriaxone.  Subsequent chest x-ray with some improvement in that infiltrate. Remains on heated HFNC at 40 L and 40% of FiO2.  Baseline oxygen requirement of 3-4 L. PLAN: --continue steroid taper as prednisone 40 mg daily --Dr. Lanney Gins following --wean O2 as tolerated, can transition to regular high flow [place] -Continue with DuoNeb. -Continue  with MetaNeb. -continue IV lasix 20 mg BID  Dysphagia.   Poor oral intake Daughter was concerned that he is having difficulty swallowing the food which he was used to take it without difficulty before.  Patient does not have his teeth. -Swallow evaluation  PLAN: --Dys1 diet, per SLP rec --Ensure BID  Sepsis secondary to UTI.  Resolved. Initially met sepsis criteria with UTI.  Urine culture grew E. coli which were pretty pansensitive except quinolones.  Completed a course of ceftriaxone.  Encephalopathy.  Some waxing and waning in his mental status. High risk for delirium and sundowning secondary to his age and underlying dementia. He was alert and oriented to self only, able to recognize daughter and communicate with her. -Dementia precautions. -Continue to monitor. -Palliative care was also consulted-patient is DNR and will continue current scope of care.  Oral thrush.  Oral thrush was noted on 06/27/20.  Patient is on steroid.  Seems improving. -Continue with Magic mouthwash.  History of coronary artery disease.  No chest pain or acute EKG changes. -cont Toprol  Hypertension.   BP 90's-120's --cont cardizem and Toprol since pt needs it for rate control --continue IV lasix for diuresis  Paroxysmal atrial fibrillation.  Patient was stable in 60s heart rate when seen this morning.  Later converted to A. fib with RVR and sustaining heart rate in 120s. --HR variable PLAN: --cont cardizem 360 mg daily --cont Toprol 25 mg daily -Continue with Eliquis.  Incidental abnormal computed tomography angiography (CTA) of abdomen and pelvis -Followed by MRI, no mass was identified -MRI of abdomen pelvis completed on 06/23/2020 -no comments on sigmoid mass -GI consult has been canceled.  Hx of pre-diabetes Hyperglycemia 2/2 steroid use --SSI  Constipation --still hasn't had a BM --cont Miralax 34g BID   Objective: Vitals:   07/01/20 0400 07/01/20 0748 07/01/20 1155 07/01/20 1619    BP: (!) 116/57 (!) 116/57 109/60   Pulse:  80 82   Resp: '17 18 16   ' Temp: 98.8 F (37.1 C) 98.5 F (36.9 C) 98.1 F (36.7 C)   TempSrc: Oral Oral Oral   SpO2: 98% 96% 96% 92%  Weight:      Height:        Intake/Output Summary (Last 24 hours) at 07/01/2020 1621 Last data filed at 07/01/2020 1420 Gross per 24 hour  Intake 240 ml  Output 1525 ml  Net -1285 ml   Filed Weights   06/28/20 0319 06/29/20 0441 06/30/20 0444  Weight: 53.2 kg 53.8 kg 53.9 kg    Examination: Constitutional: NAD, alert HEENT: conjunctivae and lids normal, EOMI CV: RRR. Distal pulses +2.  No cyanosis.   RESP: heated hf, 30% 30L  GI: +BS, NTND Extremities: No effusions, edema in BLE SKIN: warm, dry and intact Neuro: II - XII grossly intact.  Sensation intact   DVT prophylaxis: Eliquis Code Status: DNR Family Communication: wife was updated at bedside today Disposition Plan:  Status is: Inpatient  Remains inpatient appropriate because:Inpatient level of care appropriate due to severity of illness   Dispo: The patient is from: Home              Anticipated d/c is to: SNF              Anticipated d/c date is: > 3 days              Patient currently is not medically stable to d/c.  On heated hf.  Patient is high risk for deterioration and death secondary to advanced age and multiple underlying comorbidities.     Consultants:   Pulmonary  Procedures:  Antimicrobials:   Data Reviewed: I have personally reviewed following labs and imaging studies  CBC: Recent Labs  Lab 06/25/20 0439 06/28/20 0609 07/01/20 0536  WBC 14.5* 16.8* 22.1*  HGB 15.1 14.7 15.3  HCT 44.4 44.9 46.2  MCV 87.9 90.7 89.4  PLT 210 241 003   Basic Metabolic Panel: Recent Labs  Lab 06/25/20 0439 06/25/20 0439 06/26/20 0359 06/26/20 0359 06/27/20 0548 06/28/20 0609 06/29/20 0455 06/30/20 0432 07/01/20 0536  NA 139   < > 135   < > 134* 136 134* 132* 137  K 3.8   < > 3.8   < > 4.1 3.8 3.6 3.5 3.3*  CL  104   < > 99   < > 95* 98 96* 94* 97*  CO2 24   < > 25   < > '28 28 27 29 28  ' GLUCOSE 231*   < > 214*   < > 138* 174* 119* 181* 117*  BUN 18   < > 28*   < > 27* 30* 31* 22 27*  CREATININE 0.67   < > 0.71   < > 0.70 0.68 0.72 0.79 0.75  CALCIUM 8.6*   < > 8.7*   < > 9.2 8.7* 8.4* 8.3* 8.7*  MG 2.6*  --   --   --   --   --   --   --  2.4  PHOS  --   --  3.4  --   --   --   --   --   --    < > =  values in this interval not displayed.   GFR: Estimated Creatinine Clearance: 53.3 mL/min (by C-G formula based on SCr of 0.75 mg/dL). Liver Function Tests: No results for input(s): AST, ALT, ALKPHOS, BILITOT, PROT, ALBUMIN in the last 168 hours. No results for input(s): LIPASE, AMYLASE in the last 168 hours. No results for input(s): AMMONIA in the last 168 hours. Coagulation Profile: No results for input(s): INR, PROTIME in the last 168 hours. Cardiac Enzymes: No results for input(s): CKTOTAL, CKMB, CKMBINDEX, TROPONINI in the last 168 hours. BNP (last 3 results) No results for input(s): PROBNP in the last 8760 hours. HbA1C: No results for input(s): HGBA1C in the last 72 hours. CBG: Recent Labs  Lab 06/30/20 1217 06/30/20 1656 06/30/20 2139 07/01/20 0751 07/01/20 1155  GLUCAP 183* 198* 154* 120* 148*   Lipid Profile: No results for input(s): CHOL, HDL, LDLCALC, TRIG, CHOLHDL, LDLDIRECT in the last 72 hours. Thyroid Function Tests: No results for input(s): TSH, T4TOTAL, FREET4, T3FREE, THYROIDAB in the last 72 hours. Anemia Panel: No results for input(s): VITAMINB12, FOLATE, FERRITIN, TIBC, IRON, RETICCTPCT in the last 72 hours. Sepsis Labs: No results for input(s): PROCALCITON, LATICACIDVEN in the last 168 hours.  Recent Results (from the past 240 hour(s))  Culture, blood (Routine x 2)     Status: None   Collection Time: 06/17/2020  8:37 PM   Specimen: BLOOD  Result Value Ref Range Status   Specimen Description BLOOD RIGHT ANTECUBITAL  Final   Special Requests   Final    BOTTLES  DRAWN AEROBIC AND ANAEROBIC Blood Culture adequate volume   Culture   Final    NO GROWTH 5 DAYS Performed at [facility], [address] [place], [place] [address]    Report Status 06/26/2020 FINAL  Final  Urine Culture     Status: Abnormal   Collection Time: 06/02/2020  8:37 PM   Specimen: Urine, Random  Result Value Ref Range Status   Specimen Description   Final    URINE, RANDOM Performed at [facility], [address], [place], [place] [address]    Special Requests   Final    NONE Performed at [facility], [address], [place], [place] [address]    Culture >=100,000 COLONIES/mL ESCHERICHIA COLI (A)  Final   Report Status 06/23/2020 FINAL  Final   Organism ID, Bacteria ESCHERICHIA COLI (A)  Final      Susceptibility   Escherichia coli - MIC*    AMPICILLIN <=2 SENSITIVE Sensitive     CEFAZOLIN <=4 SENSITIVE Sensitive     CEFTRIAXONE <=0.25 SENSITIVE Sensitive     CIPROFLOXACIN >=4 RESISTANT Resistant     GENTAMICIN <=1 SENSITIVE Sensitive     IMIPENEM <=0.25 SENSITIVE Sensitive     NITROFURANTOIN <=16 SENSITIVE Sensitive     TRIMETH/SULFA <=20 SENSITIVE Sensitive     AMPICILLIN/SULBACTAM <=2 SENSITIVE Sensitive     PIP/TAZO <=4 SENSITIVE Sensitive     * >=100,000 COLONIES/mL ESCHERICHIA COLI  Respiratory Panel by RT PCR (Flu A&B, Covid) - Nasopharyngeal Swab     Status: None   Collection Time: 05/23/2020  8:46 PM   Specimen: Nasopharyngeal Swab  Result Value Ref Range Status   SARS Coronavirus 2 by RT PCR NEGATIVE NEGATIVE Final    Comment: (NOTE) SARS-CoV-2 target nucleic acids are NOT DETECTED.  The SARS-CoV-2 RNA is generally detectable in upper respiratoy specimens during the acute phase of infection. The lowest concentration of SARS-CoV-2 viral copies this assay can detect is 131 copies/mL.  A negative result does not preclude SARS-Cov-2 infection and should not be used as the sole basis for treatment or other patient management  decisions. A negative result may occur with  improper specimen collection/handling, submission of specimen other than nasopharyngeal swab, presence of viral mutation(s) within the areas targeted by this assay, and inadequate number of viral copies (<131 copies/mL). A negative result must be combined with clinical observations, patient history, and epidemiological information. The expected result is Negative.  Fact Sheet for Patients:  PinkCheek.be  Fact Sheet for Healthcare Providers:  GravelBags.it  This test is no t yet approved or cleared by the [place] FDA and  has been authorized for detection and/or diagnosis of SARS-CoV-2 by FDA under an Emergency Use Authorization (EUA). This EUA will remain  in effect (meaning this test can be used) for the duration of the COVID-19 declaration under Section 564(b)(1) of the Act, 21 U.S.C. section 360bbb-3(b)(1), unless the authorization is terminated or revoked sooner.     Influenza A by PCR NEGATIVE NEGATIVE Final   Influenza B by PCR NEGATIVE NEGATIVE Final    Comment: (NOTE) The Xpert Xpress SARS-CoV-2/FLU/RSV assay is intended as an aid in  the diagnosis of influenza from Nasopharyngeal swab specimens and  should not be used as a sole basis for treatment. Nasal washings and  aspirates are unacceptable for Xpert Xpress SARS-CoV-2/FLU/RSV  testing.  Fact Sheet for Patients: PinkCheek.be  Fact Sheet for Healthcare Providers: GravelBags.it  This test is not yet approved or cleared by the [place] FDA and  has been authorized for detection and/or diagnosis of SARS-CoV-2 by  FDA under an Emergency Use Authorization (EUA). This EUA will remain  in effect (meaning this test can be used) for the duration of the  Covid-19 declaration under Section 564(b)(1) of the Act, 21  U.S.C. section 360bbb-3(b)(1), unless the  authorization is  terminated or revoked. Performed at [facility], [address] [place], [place] [address]   Urine Culture     Status: Abnormal   Collection Time: 06/23/20  3:07 PM   Specimen: Urine, Random  Result Value Ref Range Status   Specimen Description   Final    URINE, RANDOM Performed at [facility], [address] [place], [place] [address]    Special Requests   Final    NONE Performed at [facility], [address] [place], [place] [address]    Culture MULTIPLE SPECIES PRESENT, SUGGEST RECOLLECTION (A)  Final   Report Status 06/25/2020 FINAL  Final     Radiology Studies: No results found.  Scheduled Meds: . acidophilus  1 capsule Oral Daily  . apixaban  2.5 mg Oral BID  . diltiazem  360 mg Oral Daily  . escitalopram  10 mg Oral Daily  . feeding supplement (ENSURE ENLIVE)  237 mL Oral BID BM  . fluticasone  2 spray Each Nare Daily  . furosemide  20 mg Intravenous BID  . insulin aspart  0-9 Units Subcutaneous TID WC  . magic mouthwash  15 mL Oral TID  . metoprolol succinate  25 mg Oral Daily  . mometasone-formoterol  2 puff Inhalation BID  . multivitamin with minerals  1 tablet Oral Daily  . pantoprazole  40 mg Oral Daily  . polyethylene glycol  34 g Oral BID  . potassium chloride  40 mEq Oral Daily  . predniSONE  40 mg Oral Q breakfast   Continuous Infusions:   LOS: 10 days    Otila Kluver  Billie Ruddy, MD [facility]  If 7PM-7AM, please contact night-coverage [email or web address]  07/01/2020, 4:21 PM

## 2020-07-02 ENCOUNTER — Inpatient Hospital Stay: Payer: PPO

## 2020-07-02 DIAGNOSIS — J9611 Chronic respiratory failure with hypoxia: Secondary | ICD-10-CM | POA: Diagnosis not present

## 2020-07-02 DIAGNOSIS — I25118 Atherosclerotic heart disease of native coronary artery with other forms of angina pectoris: Secondary | ICD-10-CM

## 2020-07-02 DIAGNOSIS — J449 Chronic obstructive pulmonary disease, unspecified: Secondary | ICD-10-CM | POA: Diagnosis not present

## 2020-07-02 DIAGNOSIS — J8410 Pulmonary fibrosis, unspecified: Secondary | ICD-10-CM

## 2020-07-02 DIAGNOSIS — G9341 Metabolic encephalopathy: Secondary | ICD-10-CM

## 2020-07-02 DIAGNOSIS — Z66 Do not resuscitate: Secondary | ICD-10-CM | POA: Diagnosis not present

## 2020-07-02 LAB — BASIC METABOLIC PANEL
Anion gap: 8 (ref 5–15)
BUN: 37 mg/dL — ABNORMAL HIGH (ref 8–23)
CO2: 31 mmol/L (ref 22–32)
Calcium: 8.9 mg/dL (ref 8.9–10.3)
Chloride: 99 mmol/L (ref 98–111)
Creatinine, Ser: 0.88 mg/dL (ref 0.61–1.24)
GFR, Estimated: >60 mL/min (ref >60)
Glucose, Bld: 114 mg/dL — ABNORMAL HIGH (ref 70–99)
Potassium: 4 mmol/L (ref 3.5–5.1)
Sodium: 138 mmol/L (ref 135–145)

## 2020-07-02 LAB — GLUCOSE, CAPILLARY
Glucose-Capillary: 154 mg/dL — ABNORMAL HIGH (ref 70–99)
Glucose-Capillary: 179 mg/dL — ABNORMAL HIGH (ref 70–99)
Glucose-Capillary: 187 mg/dL — ABNORMAL HIGH (ref 70–99)
Glucose-Capillary: 184 mg/dL — ABNORMAL HIGH (ref 70–99)

## 2020-07-02 LAB — CBC
HCT: 46.2 % (ref 39.0–52.0)
Hemoglobin: 15.4 g/dL (ref 13.0–17.0)
MCH: 29.7 pg (ref 26.0–34.0)
MCHC: 33.3 g/dL (ref 30.0–36.0)
MCV: 89 fL (ref 80.0–100.0)
Platelets: 266 10*3/uL (ref 150–400)
RBC: 5.19 MIL/uL (ref 4.22–5.81)
RDW: 16.6 % — ABNORMAL HIGH (ref 11.5–15.5)
WBC: 20 10*3/uL — ABNORMAL HIGH (ref 4.0–10.5)
nRBC: 0 % (ref 0.0–0.2)

## 2020-07-02 LAB — MAGNESIUM: Magnesium: 2.4 mg/dL (ref 1.7–2.4)

## 2020-07-02 IMAGING — DX DG CHEST 1V PORT
1 series · 1 of 1 positions shown · non-contrast
Comparison: [DATE]

CLINICAL DATA: Abnormal chest x-ray.

EXAM:
PORTABLE CHEST 1 VIEW

[chest ap]
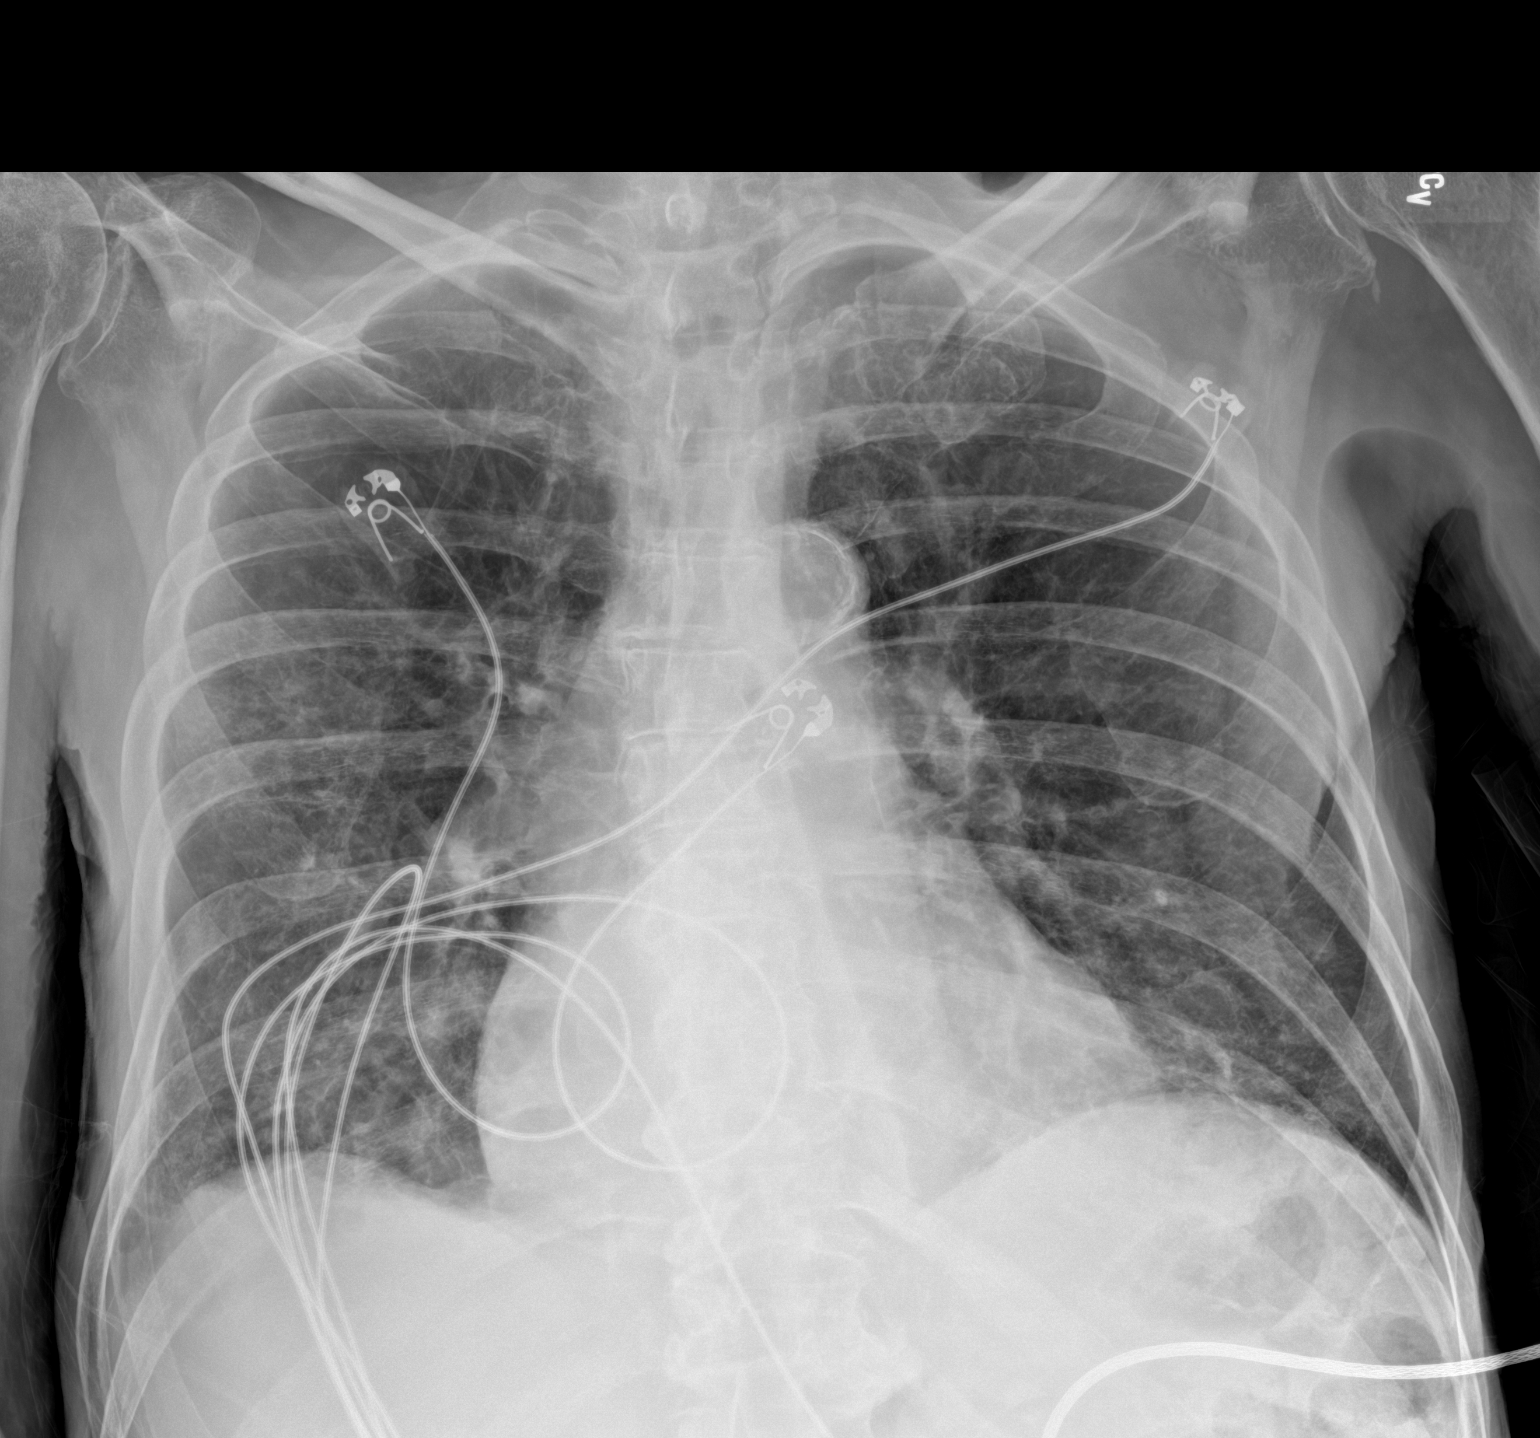

[1 of 1 positions shown; findings below may reference images not displayed]

FINDINGS: Underlying COPD with hyperinflation. Superimposed bibasilar airspace
disease demonstrates interval improvement from the prior study. No
pleural effusion or pneumothorax. Heart size and vascularity normal.
Atherosclerotic calcification aortic arch.
IMPRESSION: Interval improvement in bibasilar atelectasis/infiltrate.

## 2020-07-02 MED ORDER — PREDNISONE 20 MG PO TABS
30.0000 mg | ORAL_TABLET | Freq: Every day | ORAL | Status: DC
  Administered 2020-07-02: 30 mg via ORAL
  Filled 2020-07-02: qty 1

## 2020-07-02 NOTE — Progress Notes (Signed)
Physical Therapy Treatment Patient Details Name: Eric Li MRN: [id number] DOB: 06-15-1935 Today's Date: 07/02/2020    History of Present Illness Eric Li is an 85 y/o male who was admitted with chief complaint of abdominal pain and confusion. Pt found to have a UTI. PMH includes paroxysmal A-fib on Apixaban, HTN, HLD, CAD, CHF, COPD, pulmonary fibrosis with chronic respiratory failure, on home O2 at 3L, bilateral hearing loss, nephrolitiasis, SOB on exertion, and statin intolerance.    PT Comments    Pt presents in bed on arrival to room with NP finishing up. Pt is agreeable to PT session. He is maxA+2 for bed mobility and maxA+1 for balance once sitting EOB due to posterior lean. Pt performed exercises sitting EOB before needing to lay supine in bed due fatigue. Once supine, he performed exercises and encouraged to perform throughout his day. Performed rolling for pillow placement to take pressure of rear end. VCs needed for proper technique when rolling and during bed mobility. Monitored O2 sat throughout session and stayed between 84-100% on 4L [place]. Once cued pt's O2 sat increased to 93% within seconds. He is left in bed with all needs and family bedside. Educated family about PT recommendations for pt once discharged. Pt will benefit from skilled PT services to address deficits in strength, balance, and decrease risk for future falls.    Follow Up Recommendations  SNF;Supervision for mobility/OOB     Equipment Recommendations  Rolling walker with 5" wheels;Other (comment) (unclear if pt has one)    Recommendations for Other Services       Precautions / Restrictions Precautions Precautions: Fall Restrictions Weight Bearing Restrictions: No    Mobility  Bed Mobility Overal bed mobility: Needs Assistance Bed Mobility: Sit to Supine;Rolling Rolling: Mod assist   Supine to sit: Max assist;+2 for physical assistance;HOB elevated Sit to supine: Max assist;+2 for physical  assistance   General bed mobility comments: MAX A x2 to scoot higher in bed c draw sheet. MOD A + VCs for L rolling to reposition with pillows  Transfers                 General transfer comment: Deferred as pt is fatigued  Ambulation/Gait             General Gait Details: Did not assess due to safety   Stairs             Wheelchair Mobility    Modified Rankin (Stroke Patients Only)       Balance Overall balance assessment: Needs assistance Sitting-balance support: Bilateral upper extremity supported;Feet supported Sitting balance-Leahy Scale: Poor Sitting balance - Comments: pt with strong posterior lean with edge of bed sitting requiring MAX A for upright balance. Able to bring self upright c cues but unable to maintain Postural control: Posterior lean     Standing balance comment: Did not assess due to safety                            Cognition Arousal/Alertness: Awake/alert Behavior During Therapy: WFL for tasks assessed/performed Overall Cognitive Status: History of cognitive impairments - at baseline                                        Exercises General Exercises - Upper Extremity Shoulder Flexion: AROM;Both;5 reps;Seated General Exercises - Lower Extremity Ankle Circles/Pumps: AROM;Both;10  reps;Supine Long Arc Quad: AROM;Both;Seated;5 reps Straight Leg Raises: AROM;Both;Supine;5 reps Hip Flexion/Marching: AROM;Both;Seated;5 reps Other Exercises Other Exercises: Scapular retractions x 5 sitting EOB    General Comments General comments (skin integrity, edema, etc.): Pt notes he has a sore on rear end, so repositioned him at end of session      Pertinent Vitals/Pain Pain Assessment: Faces Faces Pain Scale: Hurts little more Pain Location: Rear end Pain Descriptors / Indicators: Sore Pain Intervention(s): Monitored during session;Repositioned    Home Living                      Prior Function             PT Goals (current goals can now be found in the care plan section) Acute Rehab PT Goals Patient Stated Goal: to go home - not to go to a facility unless absolutely necessary PT Goal Formulation: With family Time For Goal Achievement: 07/11/20 Potential to Achieve Goals: Fair Progress towards PT goals: Progressing toward goals    Frequency    Min 2X/week      PT Plan Current plan remains appropriate    Co-evaluation              AM-PAC PT "6 Clicks" Mobility   Outcome Measure  Help needed turning from your back to your side while in a flat bed without using bedrails?: A Lot Help needed moving from lying on your back to sitting on the side of a flat bed without using bedrails?: A Lot Help needed moving to and from a bed to a chair (including a wheelchair)?: A Lot Help needed standing up from a chair using your arms (e.g., wheelchair or bedside chair)?: A Lot Help needed to walk in hospital room?: A Lot Help needed climbing 3-5 steps with a railing? : A Lot 6 Click Score: 12    End of Session Equipment Utilized During Treatment: Oxygen Activity Tolerance: Patient tolerated treatment well;Patient limited by fatigue Patient left: in bed;with call bell/phone within reach;with bed alarm set;with family/visitor present Nurse Communication: Mobility status PT Visit Diagnosis: Unsteadiness on feet (R26.81);Other abnormalities of gait and mobility (R26.89);Repeated falls (R29.6);Muscle weakness (generalized) (M62.81);History of falling (Z91.81)     Time: 2831-5176 PT Time Calculation (min) (ACUTE ONLY): 25 min  Charges:                         Noemi Chapel, SPT Bernita Raisin 07/02/2020, 12:16 PM

## 2020-07-02 NOTE — Plan of Care (Signed)

## 2020-07-02 NOTE — Progress Notes (Signed)
Occupational Therapy Treatment Patient Details Name: Eric Li MRN: [id number] DOB: 04-Jan-1935 Today's Date: 07/02/2020    History of present illness Eric Li is an 85 y/o male who was admitted with chief complaint of abdominal pain and confusion. Pt found to have a UTI. PMH includes paroxysmal A-fib on Apixaban, HTN, HLD, CAD, CHF, COPD, pulmonary fibrosis with chronic respiratory failure, on home O2 at 3L, bilateral hearing loss, nephrolitiasis, SOB on exertion, and statin intolerance.   OT comments  [name] was seen for OT treatment on this date. Upon arrival to room pt reclined in bed resting. Family at bedside instructed in DME recs and safe body mechanics for transfer if pt d/c home next date. Pt awakens and requests to urinate - condom catheter noted to be removed and skin tear of R elbow present - RN/NT notified. Pt required MOD A for bed mobility, assist for trunk support. Pt initially requires MOD A in sitting 2/2 posterior lean, improving to MIN A and intermittent CGA during urination in sitting. Pt is able to position urinal but unable to hold w/o spilling for necessary duration. MIN A + VCs for L+R rolling to change sheets. Upon completion of sheet change pt again stating he needs to urinate, assisted to EOB and urinal placed. Upon completion, pt agreeable to attempt transfer to scoot higher in bed. Pt required MAX A + B knee block for modified squat pivot transfer/lateral scoot. Caregivers agreeable to attempt further caregiver education session next AM as able. Pt making good progress toward goals. Pt continues to benefit from skilled OT services to maximize return to PLOF and minimize risk of future falls, injury, caregiver burden, and readmission. Will continue to follow POC. Discharge recommendation remains appropriate.    Follow Up Recommendations  SNF;Supervision/Assistance - 24 hour    Equipment Recommendations  3 in 1 bedside commode;Hospital bed;Other (comment)  (handheld urinals )    Recommendations for Other Services      Precautions / Restrictions Precautions Precautions: Fall Restrictions Weight Bearing Restrictions: No       Mobility Bed Mobility Overal bed mobility: Needs Assistance Bed Mobility: Rolling;Supine to Sit;Sit to Supine Rolling: Min assist   Supine to sit: Mod assist Sit to supine: Mod assist   General bed mobility comments: Vcs for sequencing. Assist for trunk control   Transfers Overall transfer level: Needs assistance   Transfers: Squat Pivot Transfers     Squat pivot transfers: Max assist     General transfer comment: Pt initiates standing however requires B knee block f to transfer from laterally higher in bed using modified squat pivot transfer    Balance Overall balance assessment: Needs assistance Sitting-balance support: Feet supported;Bilateral upper extremity supported Sitting balance-Leahy Scale: Fair Sitting balance - Comments: MOD A sitting balance 2/2 posterior lean improving to CGA intermittently  Postural control: Posterior lean Standing balance support: Bilateral upper extremity supported Standing balance-Leahy Scale: Poor Standing balance comment: B knee block, unable to achieve upright posture              ADL either performed or assessed with clinical judgement   ADL Overall ADL's : Needs assistance/impaired      General ADL Comments: MOD A urinal use seated EOB - assist for sitting balance and urinal placement (pt is able to position urinal but unable to hold w/o spilling). MAX A for LBD at bed level. MAX A for ADL t/f  Cognition Arousal/Alertness: Awake/alert Behavior During Therapy: WFL for tasks assessed/performed Overall Cognitive Status: History of cognitive impairments - at baseline               Exercises Exercises: Other exercises Other Exercises Other Exercises: Pt and caregiver educated re: DME recs, d/c recs, body mechanics for safe  caregiver transfers, importance of mobility for  Other Exercises: LBD, toileting, sup<>sit x3, SPT, L+R rolling, Other Exercises: Scapular retractions x 5 sitting EOB   Shoulder Instructions       General Comments R skin tear - RN notified and in room at end of session    Pertinent Vitals/ Pain       Pain Assessment: Faces Faces Pain Scale: Hurts even more Pain Location: L forearm, R elbow Pain Descriptors / Indicators: Dull;Grimacing Pain Intervention(s): Limited activity within patient's tolerance;Monitored during session;Other (comment) (RN notified of new skin tear)         Frequency  Min 1X/week        Progress Toward Goals  OT Goals(current goals can now be found in the care plan section)  Progress towards OT goals: Progressing toward goals  Acute Rehab OT Goals Patient Stated Goal: to go home - not to go to a facility unless absolutely necessary OT Goal Formulation: With patient/family Time For Goal Achievement: 07/11/20 Potential to Achieve Goals: Fair ADL Goals Pt Will Perform Grooming: sitting;with min assist Pt Will Transfer to Toilet: squat pivot transfer;bedside commode;with min assist Pt Will Perform Toileting - Clothing Manipulation and hygiene: with min assist;sitting/lateral leans;with caregiver independent in assisting;with adaptive equipment (LRAD PRN for improved safety and functional independence.)  Plan Discharge plan remains appropriate;Frequency remains appropriate       AM-PAC OT "6 Clicks" Daily Activity     Outcome Measure   Help from another person eating meals?: A Little Help from another person taking care of personal grooming?: A Little Help from another person toileting, which includes using toliet, bedpan, or urinal?: A Lot Help from another person bathing (including washing, rinsing, drying)?: A Lot Help from another person to put on and taking off regular upper body clothing?: A Lot Help from another person to put on and taking  off regular lower body clothing?: A Lot 6 Click Score: 14    End of Session Equipment Utilized During Treatment: Oxygen  OT Visit Diagnosis: Other abnormalities of gait and mobility (R26.89);Muscle weakness (generalized) (M62.81);Other symptoms and signs involving cognitive function   Activity Tolerance Patient tolerated treatment well   Patient Left in bed;with call bell/phone within reach;with nursing/sitter in room;with family/visitor present   Nurse Communication Mobility status;Other (comment) (skin tear, need for new condom cath)        Time: 2778-2423 OT Time Calculation (min): 36 min  Charges: OT General Charges $OT Visit: 1 Visit OT Treatments $Self Care/Home Management : 23-37 mins  Dessie Coma, M.S. OTR/L  07/02/20, 3:53 PM  ascom [phone]

## 2020-07-02 NOTE — Progress Notes (Signed)
   Daily Progress Note   Patient Name: Eric Li       Date: 07/02/2020 DOB: 08-Dec-1934  Age: 85 y.o. MRN#: [id number] Attending Physician: Lorella Nimrod, MD Primary Care Physician: [name], MD Admit Date: 05/30/2020  Reason for Consultation/Follow-up: Establishing goals of care  Subjective: Patient awake and alert. Sitting up in bed. States he feels better today. Shares that he got out of the bed over the weekend which felt good to him. Looks much better compared to last week.   Dr. Reesa Chew recently at the bedside providing updates. Daughter, [name] at the bedside. She expressed appreciation of all care and support. She and family are pleased with patient's improvement throughout the weekend and are hopeful patient may be able to return home soon with family support and home health.   Detailed discussion regarding goals of care and what care will look like for patient upon returning home. Daughter verbalizes understanding. She reports family is willing to offer support in the home. Daughter states although the goal is for patient to be home and comfortable, given his shown improvement they would like to continue to allow him and opportunity to continue. She reports family is realistic in their expectations and understand patient's health may further decline in the future and possibly sooner than expected comparing his condition a week ago. At that time they would be more open to focus on his comfort.   We discussed home care needs including private pay care givers. She verbalized understanding. Family reports patient would most likely need a hospital bed and bedside commode to provide the supportive care he will need in the home. [name] confirms wishes for outpatient Palliative support with awareness they may transition patient's care to hospice at anytime by discussing with his medical team.   All questions answered and support provided.   Length of Stay: 11 days  Vital Signs: BP  104/81 (BP Location: Left Arm)   Pulse 91   Temp 98 F (36.7 C) (Oral)   Resp 15   Ht 5\' 6"  (1.676 m)   Wt 49.7 kg   SpO2 99%   BMI 17.69 kg/m  SpO2: SpO2: 99 % O2 Device: O2 Device: Nasal Cannula O2 Flow Rate: O2 Flow Rate (L/min): 4 L/min  Physical Exam: -NAD, awake and alert -normal breathing pattern, 4L/[place]  -mood appropriate, hard of hearing, follows commands          Palliative Care Assessment & Plan    Code Status:  DNR  Goals of Care/Recommendations:  Continue with current plan of care per medical team  Patient and family hopeful patient can return home in the next few days with continued family support, home health, and needed equipment. Family requesting outpatient palliative support. (TOC referral placed)  PMT will continue to support and follows as needed.    Prognosis: Guarded   Discharge Planning: Home with Home Health and Palliative.   Thank you for allowing the Palliative Medicine Team to assist in the care of this patient.  Time Total: 40 min.   Visit consisted of counseling and education dealing with the complex and emotionally intense issues of symptom management and palliative care in the setting of serious and potentially life-threatening illness.Greater than 50%  of this time was spent counseling and coordinating care related to the above assessment and plan.  Alda Lea, AGPCNP-BC  Palliative Medicine Team [phone]

## 2020-07-02 NOTE — Progress Notes (Signed)
PROGRESS NOTE    Eric Li  BZJ:[id number] DOB: July 12, 1935 DOA: 05/25/2020 PCP: [name], MD   Brief Narrative: Taken from prior notes. Eric Li a 85 year old male with history of paroxysmal A. fib on apixaban, HTN, CAD, frequent UTIs, COPD and pulmonary fibrosis with chronic respiratory failure on home O2 at 3 L presenting with altered mental status secondary to UTI.  Completed treatment with ceftriaxone. Developed acute on chronic hypoxic respiratory failure secondary to exacerbation of his pulmonary fibrosis requiring heated HFNC. Pulmonary was consulted.  Subjective: Patient was feeling better when seen today.  He was sitting upright in bed.  Daughter was at bedside and according to her he was able to drink some Ensure and eat some pured food.  Oxygen requirement improved to 4 L on regular cannula.   Assessment & Plan:   Principal Problem:   Acute metabolic encephalopathy Active Problems:   CAD (coronary artery disease)   HTN (hypertension)   UTI (urinary tract infection)   PAF (paroxysmal atrial fibrillation) (HCC)   Pulmonary fibrosis (HCC)   Chronic respiratory failure with hypoxia (HCC)   COPD, severe (HCC)   Abnormal computed tomography angiography (CTA) of abdomen and pelvis   Goals of care, counseling/discussion   Palliative care by specialist   DNR (do not resuscitate)  Acute on chronic respiratory failure with hypoxia.   secondary to pulmonary fibrosis exacerbation.   Chronic hypoxic respiratory failure on 3-4L O2 baseline Pulmonary was consulted and they started him on steroid along with diuresis.  Chest x-ray done on 06/25/2020 with left basilar opacity with concern of aspiration pneumonia.  Patient completed a course with ceftriaxone.  Subsequent chest x-ray with some improvement in that infiltrate. Today he was saturating well close to his home oxygen requirement. Baseline oxygen requirement of 3-4 L. PLAN: --continue steroid taper  -decreased to 30 mg today. -Dr. Lanney Gins following -Continue with DuoNeb. -Continue with MetaNeb. -continue IV lasix 20 mg BID -Continue with supplemental oxygen at 4 L. -Palliative care was also consulted and after discussing with family they are recommending outpatient palliative care which can be transition to hospice if he continued to deteriorate at home. -Patient and family wants to go home with maximum support-ordered maximum home health services and DME needs.  Dysphagia.   Poor oral intake Daughter was concerned that he is having difficulty swallowing the food which he was used to take it without difficulty before.  Patient does not have his teeth. -Swallow evaluation  PLAN: --Dys1 diet, per SLP rec --Ensure BID  Sepsis secondary to UTI.  Resolved. Initially met sepsis criteria with UTI.  Urine culture grew E. coli which were pretty pansensitive except quinolones.  Completed a course of ceftriaxone.  Encephalopathy.  Some waxing and waning in his mental status. High risk for delirium and sundowning secondary to his age and underlying dementia. He was alert and oriented to self only, able to recognize daughter and communicate with her. -Dementia precautions. -Continue to monitor. -Palliative care was also consulted-patient is DNR and will continue current scope of care.  Oral thrush.  Oral thrush was noted on 06/27/20.  Patient is on steroid.  Improved. -Continue with Magic mouthwash as needed.  History of coronary artery disease.  No chest pain or acute EKG changes. -cont Toprol  Hypertension.   BP 90's-120's -cont cardizem and Toprol since pt needs it for rate control -continue IV lasix for diuresis  Paroxysmal atrial fibrillation.  Patient was stable in 60s heart rate when seen this  morning.  Later converted to A. fib with RVR and sustaining heart rate in 120s. --HR variable PLAN: --cont cardizem 360 mg daily --cont Toprol 25 mg daily -Continue with  Eliquis.  Incidental abnormal computed tomography angiography (CTA) of abdomen and pelvis -Followed by MRI, no mass was identified -MRI of abdomen pelvis completed on 06/23/2020 -no comments on sigmoid mass -GI consult has been canceled.  Hx of pre-diabetes Hyperglycemia 2/2 steroid use --SSI  Constipation -cont Miralax 34g BID   Objective: Vitals:   07/02/20 0953 07/02/20 1101 07/02/20 1200 07/02/20 1611  BP:   115/64 104/63  Pulse: 91  79   Resp:   19   Temp:  98 F (36.7 C) 97.8 F (36.6 C) 97.7 F (36.5 C)  TempSrc:  Oral Oral Oral  SpO2:   100%   Weight:      Height:        Intake/Output Summary (Last 24 hours) at 07/02/2020 1613 Last data filed at 07/02/2020 0410 Gross per 24 hour  Intake --  Output 650 ml  Net -650 ml   Filed Weights   06/29/20 0441 06/30/20 0444 07/02/20 0406  Weight: 53.8 kg 53.9 kg 49.7 kg    General.  Frail elderly man, in no acute distress. Pulmonary.  Few basal crackles, normal respiratory effort. CV.  Regular rate and rhythm, no JVD, rub or murmur. Abdomen.  Soft, nontender, nondistended, BS positive. CNS.  Alert and oriented to self only.  No focal neurologic deficit. Extremities.  No edema, no cyanosis, pulses intact and symmetrical. Psychiatry.  Judgment and insight appears impaired.  DVT prophylaxis: Eliquis Code Status: DNR Family Communication: Daughter was updated at bedside. Disposition Plan:  Status is: Inpatient  Remains inpatient appropriate because:Inpatient level of care appropriate due to severity of illness   Dispo: The patient is from: Home              Anticipated d/c is to: Home with home health.              Anticipated d/c date is: 1 to 2 days.              Patient currently is not medically stable to d/c.   Patient is high risk for deterioration and death secondary to advanced age and multiple underlying comorbidities.     Consultants:   Pulmonary  Palliative care  Procedures:  Antimicrobials:    Data Reviewed: I have personally reviewed following labs and imaging studies  CBC: Recent Labs  Lab 06/28/20 0609 07/01/20 0536 07/02/20 0508  WBC 16.8* 22.1* 20.0*  HGB 14.7 15.3 15.4  HCT 44.9 46.2 46.2  MCV 90.7 89.4 89.0  PLT 241 257 833   Basic Metabolic Panel: Recent Labs  Lab 06/26/20 0359 06/27/20 0548 06/28/20 0609 06/29/20 0455 06/30/20 0432 07/01/20 0536 07/02/20 0508  NA 135   < > 136 134* 132* 137 138  K 3.8   < > 3.8 3.6 3.5 3.3* 4.0  CL 99   < > 98 96* 94* 97* 99  CO2 25   < > '28 27 29 28 31  ' GLUCOSE 214*   < > 174* 119* 181* 117* 114*  BUN 28*   < > 30* 31* 22 27* 37*  CREATININE 0.71   < > 0.68 0.72 0.79 0.75 0.88  CALCIUM 8.7*   < > 8.7* 8.4* 8.3* 8.7* 8.9  MG  --   --   --   --   --  2.4 2.4  PHOS 3.4  --   --   --   --   --   --    < > = values in this interval not displayed.   GFR: Estimated Creatinine Clearance: 44.7 mL/min (by C-G formula based on SCr of 0.88 mg/dL). Liver Function Tests: No results for input(s): AST, ALT, ALKPHOS, BILITOT, PROT, ALBUMIN in the last 168 hours. No results for input(s): LIPASE, AMYLASE in the last 168 hours. No results for input(s): AMMONIA in the last 168 hours. Coagulation Profile: No results for input(s): INR, PROTIME in the last 168 hours. Cardiac Enzymes: No results for input(s): CKTOTAL, CKMB, CKMBINDEX, TROPONINI in the last 168 hours. BNP (last 3 results) No results for input(s): PROBNP in the last 8760 hours. HbA1C: No results for input(s): HGBA1C in the last 72 hours. CBG: Recent Labs  Lab 07/01/20 1625 07/01/20 2024 07/02/20 0812 07/02/20 1256 07/02/20 1609  GLUCAP 142* 319* 154* 179* 187*   Lipid Profile: No results for input(s): CHOL, HDL, LDLCALC, TRIG, CHOLHDL, LDLDIRECT in the last 72 hours. Thyroid Function Tests: No results for input(s): TSH, T4TOTAL, FREET4, T3FREE, THYROIDAB in the last 72 hours. Anemia Panel: No results for input(s): VITAMINB12, FOLATE, FERRITIN, TIBC,  IRON, RETICCTPCT in the last 72 hours. Sepsis Labs: No results for input(s): PROCALCITON, LATICACIDVEN in the last 168 hours.  Recent Results (from the past 240 hour(s))  Urine Culture     Status: Abnormal   Collection Time: 06/23/20  3:07 PM   Specimen: Urine, Random  Result Value Ref Range Status   Specimen Description   Final    URINE, RANDOM Performed at [facility], [address], [place], [place] [address]    Special Requests   Final    NONE Performed at [facility], [address] [place] [address], [place] [address]    Culture MULTIPLE SPECIES PRESENT, SUGGEST RECOLLECTION (A)  Final   Report Status 06/25/2020 FINAL  Final     Radiology Studies: DG Chest Port 1 View  Result Date: 07/02/2020 CLINICAL DATA:  Abnormal chest x-ray. EXAM: PORTABLE CHEST 1 VIEW COMPARISON:  06/27/2020 FINDINGS: Underlying COPD with hyperinflation. Superimposed bibasilar airspace disease demonstrates interval improvement from the prior study. No pleural effusion or pneumothorax. Heart size and vascularity normal. Atherosclerotic calcification aortic arch. IMPRESSION: Interval improvement in bibasilar atelectasis/infiltrate. Electronically Signed   By: Franchot Gallo M.D.   On: 07/02/2020 08:08    Scheduled Meds: . acidophilus  1 capsule Oral Daily  . apixaban  2.5 mg Oral BID  . diltiazem  360 mg Oral Daily  . escitalopram  10 mg Oral Daily  . feeding supplement (ENSURE ENLIVE)  237 mL Oral BID BM  . fluticasone  2 spray Each Nare Daily  . furosemide  20 mg Intravenous BID  . insulin aspart  0-9 Units Subcutaneous TID WC  . magic mouthwash  15 mL Oral TID  . metoprolol succinate  25 mg Oral Daily  . mometasone-formoterol  2 puff Inhalation BID  . multivitamin with minerals  1 tablet Oral Daily  . nystatin ointment   Topical BID  . pantoprazole  40 mg Oral Daily  . polyethylene glycol  34 g Oral BID  . potassium chloride  40 mEq Oral Daily  . predniSONE  30 mg Oral Q  breakfast   Continuous Infusions:   LOS: 11 days    Lorella Nimrod, MD [facility]  If 7PM-7AM, please contact night-coverage [email or web address]  07/02/2020, 4:13 PM

## 2020-07-02 NOTE — Care Management Important Message (Signed)
Important Message  Patient Details  Name: Eric Li MRN: [id number] Date of Birth: 1935/02/17   Medicare Important Message Given:  Yes     [name] 07/02/2020, 12:10 PM

## 2020-07-02 NOTE — Progress Notes (Signed)
[facility] Liaison note: New referral for [employer] community Palliative program to follow post discharge received from Palliative NP [employer]. TOC Bridget Cobb made aware. Patient information sent to referral. Thank you. Flo Shanks BSN, RN, [place] [phone]

## 2020-07-02 NOTE — Progress Notes (Signed)
Pulmonary Medicine          Date: 07/02/2020,   MRN# [id number] Eric Li December 04, 1934     AdmissionWeight: 53.5 kg                 CurrentWeight: 49.7 kg   Referring physician: Dr Roger Shelter   CHIEF COMPLAINT:   Acute hypoxemic respiratory failure   HISTORY OF PRESENT ILLNESS   As per admission h/p Eric Li is a 85 y.o. male with medical history significant for paroxysmal A. fib on apixaban, HTN, CAD, COPD and pulmonary fibrosis with chronic respiratory failure on home O2 at 3 L, who was brought to the emergency room by EMS because of confusion. Family called with concerns for patient waking up disoriented believing he was somewhere else and appeared to be not acting himself and " talking out of his head". Wife also voiced concern for possible UTI as his urination was frequent and with strong odor. He has had no fever or chills, or chest pain or shortness of breath. Has had no nausea, vomiting or change in bowel habits. He is fully vaccinated against Covid. On arrival, his vitals were within normal limits without fever or tachycardia and with O2 sat 97% on O2 at home flow rate . Blood work was significant for WBC of 11,700 and lactic acid of 2>>1.6. Urinalysis showed pyuria. Other blood work mostly unremarkable.  EKG  NSR with RBBB with nonspecific ST-T wave changes  Chest x-ray showed no acute findings, head CT no acute findings. Due to patient complaint of abdominal pain CT abdomen and pelvis with contrast was done which showed asymmetry of the rectum and adjacent portion of the sigmoid colon. Pulmonary consultation for worsening dyspnea with acute on chronic hypoxemic respiratory failure.   06/25/20- patient evaluated at bedside this afternoon.  He had repeat CXR overnight with worsening LLL infiltrate.  Family member at bedside (daughter) shares that he had desatutation event after eating. This suggests aspiration. He made adequate urine with lasix with notable  improvement after diuresis as per family.  MetNEB was unable to be used due to patient being sleepy unable to participate.    06/26/20-  Patient with mild imrpovement.  Noted Palliative evaluation appreciate input. Will institute OT/PT starting tommorow.  Contiue gentle diuresis and MetaNEB therapy as well as current antimicrobials.   06/27/20- Patient seen and examined this morning he seems confused.  Granddaughter at bedside during my examination states he has been having similar symptoms for past 1/2 year and thoughts were revolving around dementia workup. This seems to be episode of sundowning. Patient was significantly more oriented and lucid yesterday mid-day.  He had CXR today with good improvement of previous infiltrates.   06/28/20- patient is down to 45% 45L/min in HFNC which is significantly better then previous. He is resting in bed comfortably. Family at bedside during my evaluation.   06/29/20- patient resting in bed similar setting on HFNC. No changes in recommendations today.   07/02/20-  Net negative 8L,  Repeat CXR included below- with improvement, patient is weaned down to 30/30 on HFNC will transition to regular nasal canula today if RT is available. He was able to get OOB with RT to edge of bed, had BORG 3-4 dyspnea.   Will decrease lasix today due to euvolemic status with new onset pre renal azotemia. Overall significantly improved. Will plan for continued chest physiotherapy with MetaNEB.   PAST MEDICAL HISTORY   Past Medical History:  Diagnosis Date  . Bilateral hearing loss 03/03/2016  . CAD (coronary artery disease) 02/24/2014  . CHF (congestive heart failure) ([place])   . COPD, moderate ([place]) 08/29/2015  . Hemorrhoids 02/24/2014  . HTN (hypertension) 02/24/2014  . Hyperlipidemia, unspecified 02/24/2014  . Nephrolithiasis   . SOB (shortness of breath) on exertion 03/27/2015  . Statin intolerance 03/30/2015     SURGICAL HISTORY   Past Surgical History:  Procedure Laterality Date    . CHOLECYSTECTOMY    . TONSILLECTOMY       FAMILY HISTORY   Family History  Problem Relation Age of Onset  . Bladder Cancer Neg Hx   . Prolactinoma Neg Hx   . Prostate cancer Neg Hx   . Kidney cancer Neg Hx      SOCIAL HISTORY   Social History   Tobacco Use  . Smoking status: Former Research scientist (life sciences)  . Smokeless tobacco: Never Used  Vaping Use  . Vaping Use: Never used  Substance Use Topics  . Alcohol use: No  . Drug use: No     MEDICATIONS    Home Medication:    Current Medication:  Current Facility-Administered Medications:  .  acetaminophen (TYLENOL) tablet 650 mg, 650 mg, Oral, Q6H PRN, 650 mg at 07/01/20 1703 **OR** acetaminophen (TYLENOL) suppository 650 mg, 650 mg, Rectal, Q6H PRN, Athena Masse, MD .  acidophilus (RISAQUAD) capsule 1 capsule, 1 capsule, Oral, Daily, Shahmehdi, Seyed A, MD, 1 capsule at 07/01/20 0942 .  apixaban (ELIQUIS) tablet 2.5 mg, 2.5 mg, Oral, BID, Judd Gaudier V, MD, 2.5 mg at 07/01/20 2128 .  diclofenac Sodium (VOLTAREN) 1 % topical gel 2 g, 2 g, Topical, BID PRN, Shahmehdi, Seyed A, MD .  diltiazem (CARDIZEM CD) 24 hr capsule 360 mg, 360 mg, Oral, Daily, Lorella Nimrod, MD, 360 mg at 07/01/20 0942 .  escitalopram (LEXAPRO) tablet 10 mg, 10 mg, Oral, Daily, Judd Gaudier V, MD, 10 mg at 07/01/20 0943 .  feeding supplement (ENSURE ENLIVE) (ENSURE ENLIVE) liquid 237 mL, 237 mL, Oral, BID BM, Amin, Sumayya, MD, 237 mL at 07/01/20 1230 .  fluticasone (FLONASE) 50 MCG/ACT nasal spray 2 spray, 2 spray, Each Nare, Daily, Judd Gaudier V, MD, 2 spray at 07/01/20 0944 .  furosemide (LASIX) injection 20 mg, 20 mg, Intravenous, BID, Enzo Bi, MD, 20 mg at 07/02/20 0655 .  insulin aspart (novoLOG) injection 0-9 Units, 0-9 Units, Subcutaneous, TID WC, Shahmehdi, Seyed A, MD, 1 Units at 07/01/20 1703 .  levalbuterol (XOPENEX) nebulizer solution 0.63 mg, 0.63 mg, Nebulization, Q6H PRN, Lang Snow, NP, 0.63 mg at 06/27/20 0845 .  magic  mouthwash, 15 mL, Oral, TID, Lorella Nimrod, MD, 15 mL at 07/01/20 2130 .  metoprolol succinate (TOPROL-XL) 24 hr tablet 25 mg, 25 mg, Oral, Daily, Kerolos Nehme, MD, 25 mg at 07/01/20 0943 .  mometasone-formoterol (DULERA) 200-5 MCG/ACT inhaler 2 puff, 2 puff, Inhalation, BID, Athena Masse, MD, 2 puff at 07/01/20 2129 .  multivitamin with minerals tablet 1 tablet, 1 tablet, Oral, Daily, Athena Masse, MD, 1 tablet at 07/01/20 [phone] .  nitroGLYCERIN (NITROSTAT) SL tablet 0.4 mg, 0.4 mg, Sublingual, Q5 min PRN, Athena Masse, MD .  nystatin ointment (MYCOSTATIN), , Topical, BID, Enzo Bi, MD, Given at 07/01/20 2129 .  ondansetron (ZOFRAN) tablet 4 mg, 4 mg, Oral, Q6H PRN **OR** ondansetron (ZOFRAN) injection 4 mg, 4 mg, Intravenous, Q6H PRN, Athena Masse, MD .  pantoprazole (PROTONIX) EC tablet 40 mg, 40 mg, Oral, Daily, Damita Dunnings,  Waldemar Dickens, MD, 40 mg at 07/01/20 0943 .  polyethylene glycol (MIRALAX / GLYCOLAX) packet 34 g, 34 g, Oral, BID, Enzo Bi, MD, 34 g at 07/01/20 0943 .  potassium chloride 20 MEQ/15ML (10%) solution 40 mEq, 40 mEq, Oral, Daily, Enzo Bi, MD, 40 mEq at 07/01/20 1229 .  predniSONE (DELTASONE) tablet 30 mg, 30 mg, Oral, Q breakfast, Ottie Glazier, MD .  psyllium (HYDROCIL/METAMUCIL) 1 packet, 1 packet, Oral, Daily PRN, Athena Masse, MD, 1 packet at 06/29/20 1028 .  traMADol (ULTRAM) tablet 50 mg, 50 mg, Oral, Q6H PRN, Shahmehdi, Seyed A, MD, 50 mg at 07/01/20 2128 .  traZODone (DESYREL) tablet 50 mg, 50 mg, Oral, QHS PRN, Shahmehdi, Seyed A, MD, 50 mg at 07/01/20 2128    ALLERGIES   Simvastatin     REVIEW OF SYSTEMS    Review of Systems:  Gen:  Denies  fever, sweats, chills weigh loss  HEENT: Denies blurred vision, double vision, ear pain, eye pain, hearing loss, nose bleeds, sore throat Cardiac:  No dizziness, chest pain or heaviness, chest tightness,edema Resp:   Denies cough or sputum porduction, shortness of breath,wheezing, hemoptysis,  Gi:  Denies swallowing difficulty, stomach pain, nausea or vomiting, diarrhea, constipation, bowel incontinence Gu:  Denies bladder incontinence, burning urine Ext:   Denies Joint pain, stiffness or swelling Skin: Denies  skin rash, easy bruising or bleeding or hives Endoc:  Denies polyuria, polydipsia , polyphagia or weight change Psych:   Denies depression, insomnia or hallucinations   Other:  All other systems negative   VS: BP 104/81 (BP Location: Left Arm)   Pulse (!) 58   Temp 97.6 F (36.4 C) (Oral)   Resp 15   Ht 5\' 6"  (1.676 m)   Wt 49.7 kg   SpO2 92%   BMI 17.69 kg/m      PHYSICAL EXAM    GENERAL:NAD, no fevers, chills, no weakness no fatigue HEAD: Normocephalic, atraumatic.  EYES: Pupils equal, round, reactive to light. Extraocular muscles intact. No scleral icterus.  MOUTH: Moist mucosal membrane. Dentition intact. No abscess noted.  EAR, NOSE, THROAT: Clear without exudates. No external lesions.  NECK: Supple. No thyromegaly. No nodules. No JVD.  PULMONARY: mild ronchi bilaterally  CARDIOVASCULAR: S1 and S2. Regular rate and rhythm. No murmurs, rubs, or gallops. No edema. Pedal pulses 2+ bilaterally.  GASTROINTESTINAL: Soft, nontender, nondistended. No masses. Positive bowel sounds. No hepatosplenomegaly.  MUSCULOSKELETAL: No swelling, clubbing, or edema. Range of motion full in all extremities.  NEUROLOGIC: Cranial nerves II through XII are intact. No gross focal neurological deficits. Sensation intact. Reflexes intact.  SKIN: No ulceration, lesions, rashes, or cyanosis. Skin warm and dry. Turgor intact.  PSYCHIATRIC: Mood, affect within normal limits. The patient is awake, alert and oriented x 3. Insight, judgment intact.       IMAGING    DG Chest 1 View  Result Date: 06/25/2020 CLINICAL DATA:  Shortness of breath EXAM: CHEST  1 VIEW COMPARISON:  June 24, 2020 FINDINGS: The heart size and mediastinal contours are unchanged with mild cardiomegaly. Aortic  knob calcifications are seen. Again noted is hyperinflation of the lung zones with flattening of the hemidiaphragms. There is mildly increased interstitial opacities seen at both lower lungs. There is new hazy airspace opacity seen at the left lung base. No acute osseous abnormality. IMPRESSION: Increased hazy airspace opacity at the left lung base which could be due to atelectasis and/or infectious etiology. Probable chronic interstitial opacities at both lungs. Electronically Signed  By: Prudencio Pair M.D.   On: 06/25/2020 03:47   DG Chest 2 View  Result Date: 06/02/2020 CLINICAL DATA:  Suspect sepsis. EXAM: CHEST - 2 VIEW COMPARISON:  03/25/2019 FINDINGS: Heart size and vascularity normal. Mild bibasilar atelectasis. Small right effusion. Elevated left hemidiaphragm with bowel gas below the left diaphragm. Mild hyperinflation lungs. No mass lesion. No acute skeletal abnormality. IMPRESSION: Mild bibasilar atelectasis and small right effusion.   COPD. Electronically Signed   By: Eric Gallo M.D.   On: 06/18/2020 13:35   CT Head Wo Contrast  Result Date: 06/05/2020 CLINICAL DATA:  Altered mental status. EXAM: CT HEAD WITHOUT CONTRAST TECHNIQUE: Contiguous axial images were obtained from the base of the skull through the vertex without intravenous contrast. COMPARISON:  March 16, 2018 FINDINGS: Brain: There is mild cerebral atrophy with widening of the extra-axial spaces and ventricular dilatation. There are areas of decreased attenuation within the white matter tracts of the supratentorial brain, consistent with microvascular disease changes. Vascular: No hyperdense vessel or unexpected calcification. Skull: Normal. Negative for fracture or focal lesion. Sinuses/Orbits: Very mild, bilateral ethmoid sinus mucosal thickening is seen. Other: None. IMPRESSION: 1. Generalized cerebral atrophy. 2. Very mild, bilateral ethmoid sinus disease. 3. No acute intracranial abnormality. Electronically Signed   By:  Virgina Norfolk M.D.   On: 06/17/2020 21:11   MR PELVIS WO CONTRAST  Result Date: 06/24/2020 CLINICAL DATA:  Possible rectal mass on CT EXAM: MRI PELVIS WITHOUT CONTRAST TECHNIQUE: Multiplanar multisequence MR imaging of the pelvis was performed. No intravenous contrast was administered. Small amount of [place] gel was administered per rectum to optimize tumor evaluation. COMPARISON:  CT abdomen/pelvis dated 05/29/2020 FINDINGS: Urinary Tract:  Bladder is within normal limits. Bowel: Distension of the rectum with ultrasound gel. No rectal mass is seen. Visualized bowel is otherwise unremarkable, including the sigmoid colon. Vascular/Lymphatic: No evidence of aneurysm. No suspicious pelvic lymphadenopathy. Reproductive:  Suspected postsurgical changes related to prior TURP. Other:  No pelvic ascites. Musculoskeletal: Mild degenerative changes of the lower lumbar spine. IMPRESSION: No rectosigmoid mass is evident on MR. Negative pelvic MRI. Electronically Signed   By: Julian Hy M.D.   On: 06/24/2020 12:15   MR ABDOMEN W WO CONTRAST  Result Date: 06/24/2020 CLINICAL DATA:  Inpatient. Frequent UTI. COPD and pulmonary fibrosis with chronic respiratory failure. Confusion. Concern for rectal mass on recent CT. EXAM: MRI ABDOMEN WITHOUT AND WITH CONTRAST TECHNIQUE: Multiplanar multisequence MR imaging of the abdomen was performed both before and after the administration of intravenous contrast. CONTRAST:  7.62mL GADAVIST GADOBUTROL 1 MMOL/ML IV SOLN COMPARISON:  05/27/2020 CT abdomen/pelvis. FINDINGS: Lower chest: Small dependent bilateral pleural effusions, right greater than left. Hepatobiliary: Normal liver size and configuration. No hepatic steatosis. No liver mass. Cholecystectomy. Bile ducts are within normal post cholecystectomy limits. Common bile duct diameter 5 mm. No choledocholithiasis. No biliary masses, strictures or beading. Probable 12 mm diameter cystic duct remnant in the porta hepatis (series  4/image 14). Pancreas: No pancreatic mass or duct dilation.  No pancreas divisum. Spleen: Normal size. No mass. Adrenals/Urinary Tract: Normal adrenals. No hydronephrosis. Scattered simple subcentimeter renal cortical cysts in both kidneys. No suspicious renal masses. Stomach/Bowel: Normal non-distended stomach. Visualized small and large bowel is normal caliber, with no bowel wall thickening. Vascular/Lymphatic: Atherosclerotic nonaneurysmal abdominal aorta. Patent portal, splenic, hepatic and renal veins. Retroaortic left renal vein. No pathologically enlarged lymph nodes in the abdomen. Other: No abdominal ascites or focal fluid collection. Musculoskeletal: No aggressive appearing focal osseous lesions.  Mild L4 vertebral compression fracture, chronic appearing. IMPRESSION: 1. No lymphadenopathy or other findings to suggest metastatic disease in the abdomen. MRI pelvis to be reported separately. 2. Bile ducts are within normal post cholecystectomy limits (CBD diameter 5 mm). No choledocholithiasis. Probable small cystic duct remnant in the porta hepatis. 3. Small dependent bilateral pleural effusions, right greater than left. 4. Mild L4 vertebral compression fracture, chronic appearing. Electronically Signed   By: Ilona Sorrel M.D.   On: 06/24/2020 07:01   CT ABDOMEN PELVIS W CONTRAST  Result Date: 06/07/2020 CLINICAL DATA:  Abdominal pain. EXAM: CT ABDOMEN AND PELVIS WITH CONTRAST TECHNIQUE: Multidetector CT imaging of the abdomen and pelvis was performed using the standard protocol following bolus administration of intravenous contrast. CONTRAST:  135mL OMNIPAQUE IOHEXOL 300 MG/ML  SOLN COMPARISON:  None. FINDINGS: Lower chest: A trace amount of atelectasis is seen within the right lung base. A very small right pleural effusion is noted. Hepatobiliary: No focal liver abnormality is seen. Status post cholecystectomy. No biliary dilatation. Pancreas: Unremarkable. No pancreatic ductal dilatation or surrounding  inflammatory changes. Spleen: Normal in size without focal abnormality. Adrenals/Urinary Tract: Adrenal glands are unremarkable. Kidneys are normal, without renal calculi, focal lesion, or hydronephrosis. Bladder is unremarkable. Stomach/Bowel: Stomach is within normal limits. Appendix appears normal. No evidence of bowel dilatation. There is marked severity thickening of the rectum and adjacent portion of the distal sigmoid colon (axial CT images 71 through 75, CT series number 2). This is slightly asymmetric in appearance, right greater than left. No associated inflammatory fat stranding is seen. Vascular/Lymphatic: There is marked severity calcification and atherosclerosis of the abdominal aorta and bilateral common iliac arteries, without evidence of aneurysmal dilatation. No enlarged abdominal or pelvic lymph nodes. Reproductive: The prostate gland is mildly enlarged. Other: No abdominal wall hernia or abnormality. No abdominopelvic ascites. Musculoskeletal: Moderate to marked severity multilevel degenerative changes seen throughout the lumbar spine. IMPRESSION: 1. Asymmetric of the rectum and adjacent portion of the sigmoid colon, which may be secondary to an underlying mass. Correlation with physical examination and MRI is recommended. 2. Evidence of prior cholecystectomy. 3. Very small right pleural effusion. 4. Mildly enlarged prostate gland. 5. Moderate to marked severity multilevel degenerative changes throughout the lumbar spine. 6. Aortic atherosclerosis. Aortic Atherosclerosis (ICD10-I70.0). Electronically Signed   By: Virgina Norfolk M.D.   On: 05/28/2020 21:22   DG Chest Port 1 View  Result Date: 06/27/2020 CLINICAL DATA:  Altered mental status EXAM: PORTABLE CHEST 1 VIEW COMPARISON:  06/25/2020 FINDINGS: Chronic interstitial prominence. Improved aeration at the left lung base. No pleural effusion or pneumothorax. Stable cardiomediastinal contours. IMPRESSION: Improved aeration at the left lung  base since 06/25/2020. No new findings. Electronically Signed   By: Macy Mis M.D.   On: 06/27/2020 08:07   DG Chest Port 1 View  Result Date: 06/24/2020 CLINICAL DATA:  Shortness of breath, pulmonary fibrosis, altered mental status EXAM: PORTABLE CHEST 1 VIEW COMPARISON:  06/20/2020 FINDINGS: Mild cardiomegaly. Emphysema. Mild, diffuse interstitial opacity. Bibasilar fibrotic scarring. The visualized skeletal structures are unremarkable. IMPRESSION: Emphysema and bibasilar fibrotic change with mild, diffuse interstitial opacity, potentially edema. No focal airspace opacity. Electronically Signed   By: Eddie Candle M.D.   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    Patient/Family are satisfied with care plan and all questions have been answered.  This document was prepared using Dragon voice recognition software and may include unintentional dictation errors.     Ottie Glazier, M.D.  Division of [facility]

## 2020-07-03 ENCOUNTER — Inpatient Hospital Stay: Payer: PPO

## 2020-07-03 DIAGNOSIS — G9341 Metabolic encephalopathy: Secondary | ICD-10-CM | POA: Diagnosis not present

## 2020-07-03 LAB — CBC
HCT: 46.1 % (ref 39.0–52.0)
Hemoglobin: 14.9 g/dL (ref 13.0–17.0)
MCH: 29.9 pg (ref 26.0–34.0)
MCHC: 32.3 g/dL (ref 30.0–36.0)
MCV: 92.6 fL (ref 80.0–100.0)
Platelets: 245 10*3/uL (ref 150–400)
RBC: 4.98 MIL/uL (ref 4.22–5.81)
RDW: 16.8 % — ABNORMAL HIGH (ref 11.5–15.5)
WBC: 17.3 10*3/uL — ABNORMAL HIGH (ref 4.0–10.5)
nRBC: 0 % (ref 0.0–0.2)

## 2020-07-03 LAB — BASIC METABOLIC PANEL
Anion gap: 12 (ref 5–15)
BUN: 29 mg/dL — ABNORMAL HIGH (ref 8–23)
CO2: 27 mmol/L (ref 22–32)
Calcium: 8.9 mg/dL (ref 8.9–10.3)
Chloride: 97 mmol/L — ABNORMAL LOW (ref 98–111)
Creatinine, Ser: 0.86 mg/dL (ref 0.61–1.24)
GFR, Estimated: >60 mL/min (ref >60)
Glucose, Bld: 221 mg/dL — ABNORMAL HIGH (ref 70–99)
Potassium: 4.5 mmol/L (ref 3.5–5.1)
Sodium: 136 mmol/L (ref 135–145)

## 2020-07-03 LAB — GLUCOSE, CAPILLARY
Glucose-Capillary: 192 mg/dL — ABNORMAL HIGH (ref 70–99)
Glucose-Capillary: 245 mg/dL — ABNORMAL HIGH (ref 70–99)
Glucose-Capillary: 216 mg/dL — ABNORMAL HIGH (ref 70–99)
Glucose-Capillary: 179 mg/dL — ABNORMAL HIGH (ref 70–99)

## 2020-07-03 LAB — MAGNESIUM: Magnesium: 2.3 mg/dL (ref 1.7–2.4)

## 2020-07-03 IMAGING — DX DG CHEST 1V PORT
1 series · 1 of 1 positions shown · non-contrast
Comparison: [DATE].

CLINICAL DATA: Hypoxia.

EXAM:
PORTABLE CHEST 1 VIEW

[chest ap]
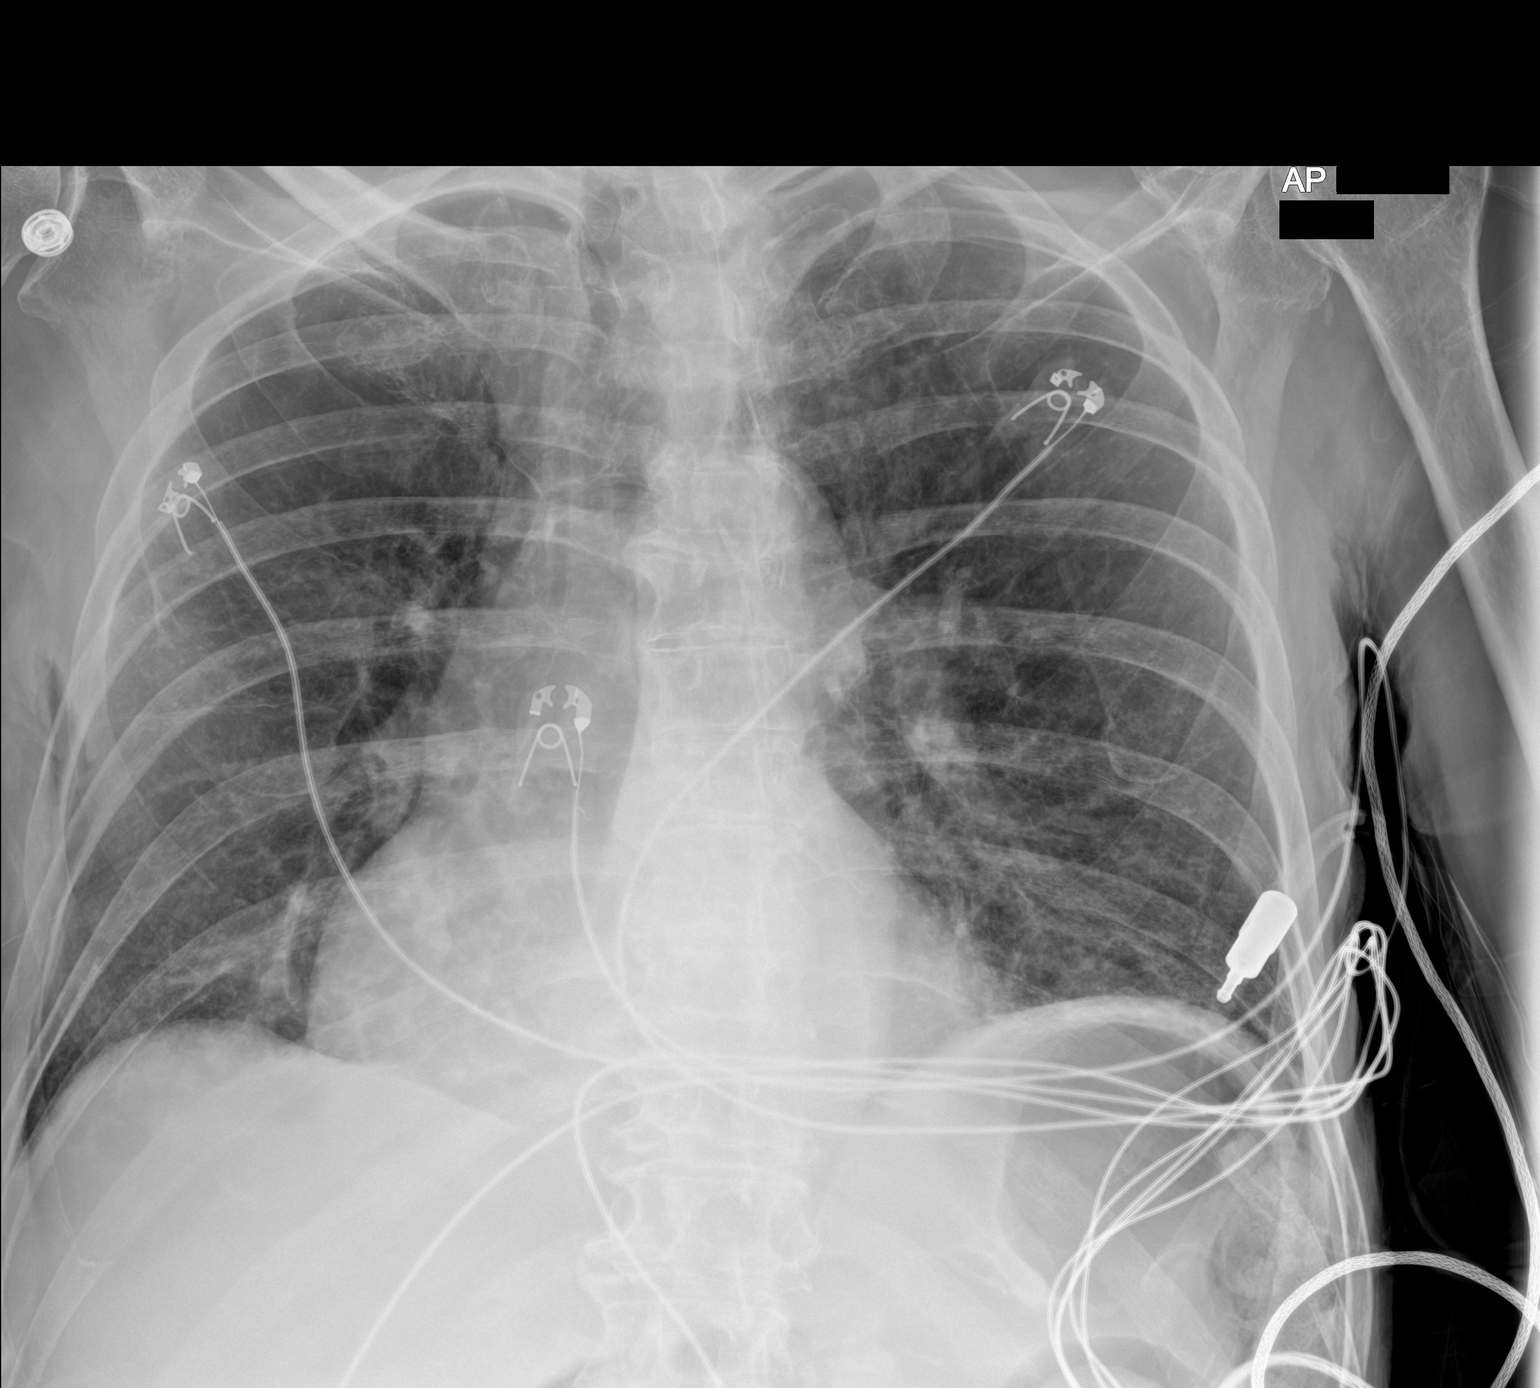

[1 of 1 positions shown; findings below may reference images not displayed]

FINDINGS: The heart size and mediastinal contours are within normal limits. No
pneumothorax or pleural effusion is noted. Mild bibasilar
subsegmental atelectasis is noted. The visualized skeletal
structures are unremarkable.
IMPRESSION: Mild bibasilar subsegmental atelectasis.

## 2020-07-03 MED ORDER — PREDNISONE 20 MG PO TABS
20.0000 mg | ORAL_TABLET | Freq: Every day | ORAL | Status: DC
  Administered 2020-07-03 – 2020-07-06 (×4): 20 mg via ORAL
  Filled 2020-07-03 (×4): qty 1

## 2020-07-03 MED ORDER — POTASSIUM CHLORIDE 20 MEQ/15ML (10%) PO SOLN
20.0000 meq | Freq: Every day | ORAL | Status: DC
  Administered 2020-07-04 – 2020-07-08 (×5): 20 meq via ORAL
  Filled 2020-07-03 (×7): qty 15

## 2020-07-03 NOTE — Progress Notes (Signed)
Pulmonary Medicine          Date: 07/03/2020,   MRN# [id number] Eric Li 1934-12-24     AdmissionWeight: 53.5 kg                 CurrentWeight: 50 kg   Referring physician: Dr Roger Shelter   CHIEF COMPLAINT:   Acute hypoxemic respiratory failure   HISTORY OF PRESENT ILLNESS   As per admission h/p Eric Li is a 85 y.o. male with medical history significant for paroxysmal A. fib on apixaban, HTN, CAD, COPD and pulmonary fibrosis with chronic respiratory failure on home O2 at 3 L, who was brought to the emergency room by EMS because of confusion. Family called with concerns for patient waking up disoriented believing he was somewhere else and appeared to be not acting himself and " talking out of his head". Wife also voiced concern for possible UTI as his urination was frequent and with strong odor. He has had no fever or chills, or chest pain or shortness of breath. Has had no nausea, vomiting or change in bowel habits. He is fully vaccinated against Covid. On arrival, his vitals were within normal limits without fever or tachycardia and with O2 sat 97% on O2 at home flow rate . Blood work was significant for WBC of 11,700 and lactic acid of 2>>1.6. Urinalysis showed pyuria. Other blood work mostly unremarkable.  EKG  NSR with RBBB with nonspecific ST-T wave changes  Chest x-ray showed no acute findings, head CT no acute findings. Due to patient complaint of abdominal pain CT abdomen and pelvis with contrast was done which showed asymmetry of the rectum and adjacent portion of the sigmoid colon. Pulmonary consultation for worsening dyspnea with acute on chronic hypoxemic respiratory failure.   06/25/20- patient evaluated at bedside this afternoon.  He had repeat CXR overnight with worsening LLL infiltrate.  Family member at bedside (daughter) shares that he had desatutation event after eating. This suggests aspiration. He made adequate urine with lasix with notable  improvement after diuresis as per family.  MetNEB was unable to be used due to patient being sleepy unable to participate.    06/26/20-  Patient with mild imrpovement.  Noted Palliative evaluation appreciate input. Will institute OT/PT starting tommorow.  Contiue gentle diuresis and MetaNEB therapy as well as current antimicrobials.   06/27/20- Patient seen and examined this morning he seems confused.  Granddaughter at bedside during my examination states he has been having similar symptoms for past 1/2 year and thoughts were revolving around dementia workup. This seems to be episode of sundowning. Patient was significantly more oriented and lucid yesterday mid-day.  He had CXR today with good improvement of previous infiltrates.   06/28/20- patient is down to 45% 45L/min in HFNC which is significantly better then previous. He is resting in bed comfortably. Family at bedside during my evaluation.   06/29/20- patient resting in bed similar setting on HFNC. No changes in recommendations today.   07/02/20-  Net negative 8L,  Repeat CXR included below- with improvement, patient is weaned down to 30/30 on HFNC will transition to regular nasal canula today if RT is available. He was able to get OOB with RT to edge of bed, had BORG 3-4 dyspnea.   Will decrease lasix today due to euvolemic status with new onset pre renal azotemia. Overall significantly improved. Will plan for continued chest physiotherapy with MetaNEB.   07/03/20- Patient resting in bed. Family at bedside. He  is on home setting of 4L/min [place].  He is participating with PT.  Overall respiratory status is much improved. Weaning steroids prednisone taper.   PAST MEDICAL HISTORY   Past Medical History:  Diagnosis Date   Bilateral hearing loss 03/03/2016   CAD (coronary artery disease) 02/24/2014   CHF (congestive heart failure) (HCC)    COPD, moderate ([facility]) 08/29/2015   Hemorrhoids 02/24/2014   HTN (hypertension) 02/24/2014   Hyperlipidemia,  unspecified 02/24/2014   Nephrolithiasis    SOB (shortness of breath) on exertion 03/27/2015   Statin intolerance 03/30/2015     SURGICAL HISTORY   Past Surgical History:  Procedure Laterality Date   CHOLECYSTECTOMY     TONSILLECTOMY       FAMILY HISTORY   Family History  Problem Relation Age of Onset   Bladder Cancer Neg Hx    Prolactinoma Neg Hx    Prostate cancer Neg Hx    Kidney cancer Neg Hx      SOCIAL HISTORY   Social History   Tobacco Use   Smoking status: Former Smoker   Smokeless tobacco: Never Used  Scientific laboratory technician Use: Never used  Substance Use Topics   Alcohol use: No   Drug use: No     MEDICATIONS    Home Medication:    Current Medication:  Current Facility-Administered Medications:    acetaminophen (TYLENOL) tablet 650 mg, 650 mg, Oral, Q6H PRN, 650 mg at 07/01/20 1703 **OR** acetaminophen (TYLENOL) suppository 650 mg, 650 mg, Rectal, Q6H PRN, Athena Masse, MD   acidophilus (RISAQUAD) capsule 1 capsule, 1 capsule, Oral, Daily, Shahmehdi, Seyed A, MD, 1 capsule at 07/02/20 0939   apixaban (ELIQUIS) tablet 2.5 mg, 2.5 mg, Oral, BID, Judd Gaudier V, MD, 2.5 mg at 07/02/20 2120   diclofenac Sodium (VOLTAREN) 1 % topical gel 2 g, 2 g, Topical, BID PRN, Shahmehdi, Seyed A, MD   diltiazem (CARDIZEM CD) 24 hr capsule 360 mg, 360 mg, Oral, Daily, Amin, Soundra Pilon, MD, 360 mg at 07/02/20 0939   escitalopram (LEXAPRO) tablet 10 mg, 10 mg, Oral, Daily, Judd Gaudier V, MD, 10 mg at 07/02/20 0939   feeding supplement (ENSURE ENLIVE) (ENSURE ENLIVE) liquid 237 mL, 237 mL, Oral, BID BM, Amin, Sumayya, MD, 237 mL at 07/02/20 0939   fluticasone (FLONASE) 50 MCG/ACT nasal spray 2 spray, 2 spray, Each Nare, Daily, Judd Gaudier V, MD, 2 spray at 07/02/20 0940   furosemide (LASIX) injection 20 mg, 20 mg, Intravenous, BID, Enzo Bi, MD, 20 mg at 07/03/20 2979   insulin aspart (novoLOG) injection 0-9 Units, 0-9 Units, Subcutaneous, TID WC,  Shahmehdi, Seyed A, MD, 3 Units at 07/02/20 1811   levalbuterol (XOPENEX) nebulizer solution 0.63 mg, 0.63 mg, Nebulization, Q6H PRN, Lang Snow, NP, 0.63 mg at 06/27/20 0845   magic mouthwash, 15 mL, Oral, TID, Lorella Nimrod, MD, 15 mL at 07/02/20 2121   metoprolol succinate (TOPROL-XL) 24 hr tablet 25 mg, 25 mg, Oral, Daily, Lanney Gins, Castle Lamons, MD, 25 mg at 07/02/20 0940   mometasone-formoterol (DULERA) 200-5 MCG/ACT inhaler 2 puff, 2 puff, Inhalation, BID, Athena Masse, MD, 2 puff at 07/02/20 2121   multivitamin with minerals tablet 1 tablet, 1 tablet, Oral, Daily, Judd Gaudier V, MD, 1 tablet at 07/02/20 0940   nitroGLYCERIN (NITROSTAT) SL tablet 0.4 mg, 0.4 mg, Sublingual, Q5 min PRN, Athena Masse, MD   nystatin ointment (MYCOSTATIN), , Topical, BID, Enzo Bi, MD, Given at 07/02/20 2121   ondansetron (ZOFRAN) tablet 4  mg, 4 mg, Oral, Q6H PRN **OR** ondansetron (ZOFRAN) injection 4 mg, 4 mg, Intravenous, Q6H PRN, Athena Masse, MD   pantoprazole (PROTONIX) EC tablet 40 mg, 40 mg, Oral, Daily, Judd Gaudier V, MD, 40 mg at 07/02/20 5361   polyethylene glycol (MIRALAX / GLYCOLAX) packet 34 g, 34 g, Oral, BID, Enzo Bi, MD, 34 g at 07/02/20 0940   potassium chloride 20 MEQ/15ML (10%) solution 40 mEq, 40 mEq, Oral, Daily, Enzo Bi, MD, 40 mEq at 07/02/20 4431   predniSONE (DELTASONE) tablet 20 mg, 20 mg, Oral, Q breakfast, Ottie Glazier, MD   psyllium (HYDROCIL/METAMUCIL) 1 packet, 1 packet, Oral, Daily PRN, Athena Masse, MD, 1 packet at 06/29/20 1028   traMADol (ULTRAM) tablet 50 mg, 50 mg, Oral, Q6H PRN, Shahmehdi, Seyed A, MD, 50 mg at 07/02/20 2120   traZODone (DESYREL) tablet 50 mg, 50 mg, Oral, QHS PRN, Skipper Cliche A, MD, 50 mg at 07/02/20 2120    ALLERGIES   Simvastatin     REVIEW OF SYSTEMS    Review of Systems:  Gen:  Denies  fever, sweats, chills weigh loss  HEENT: Denies blurred vision, double vision, ear pain, eye pain,  hearing loss, nose bleeds, sore throat Cardiac:  No dizziness, chest pain or heaviness, chest tightness,edema Resp:   Denies cough or sputum porduction, shortness of breath,wheezing, hemoptysis,  Gi: Denies swallowing difficulty, stomach pain, nausea or vomiting, diarrhea, constipation, bowel incontinence Gu:  Denies bladder incontinence, burning urine Ext:   Denies Joint pain, stiffness or swelling Skin: Denies  skin rash, easy bruising or bleeding or hives Endoc:  Denies polyuria, polydipsia , polyphagia or weight change Psych:   Denies depression, insomnia or hallucinations   Other:  All other systems negative   VS: BP 110/78 (BP Location: Left Arm)    Pulse (!) 110    Temp 97.6 F (36.4 C) (Oral)    Resp 20    Ht 5\' 6"  (1.676 m)    Wt 50 kg    SpO2 95%    BMI 17.80 kg/m      PHYSICAL EXAM    GENERAL:NAD, no fevers, chills, no weakness no fatigue HEAD: Normocephalic, atraumatic.  EYES: Pupils equal, round, reactive to light. Extraocular muscles intact. No scleral icterus.  MOUTH: Moist mucosal membrane. Dentition intact. No abscess noted.  EAR, NOSE, THROAT: Clear without exudates. No external lesions.  NECK: Supple. No thyromegaly. No nodules. No JVD.  PULMONARY: mild ronchi bilaterally  CARDIOVASCULAR: S1 and S2. Regular rate and rhythm. No murmurs, rubs, or gallops. No edema. Pedal pulses 2+ bilaterally.  GASTROINTESTINAL: Soft, nontender, nondistended. No masses. Positive bowel sounds. No hepatosplenomegaly.  MUSCULOSKELETAL: No swelling, clubbing, or edema. Range of motion full in all extremities.  NEUROLOGIC: Cranial nerves II through XII are intact. No gross focal neurological deficits. Sensation intact. Reflexes intact.  SKIN: No ulceration, lesions, rashes, or cyanosis. Skin warm and dry. Turgor intact.  PSYCHIATRIC: Mood, affect within normal limits. The patient is awake, alert and oriented x 3. Insight, judgment intact.       IMAGING    DG Chest 1  View  Result Date: 06/25/2020 CLINICAL DATA:  Shortness of breath EXAM: CHEST  1 VIEW COMPARISON:  June 24, 2020 FINDINGS: The heart size and mediastinal contours are unchanged with mild cardiomegaly. Aortic knob calcifications are seen. Again noted is hyperinflation of the lung zones with flattening of the hemidiaphragms. There is mildly increased interstitial opacities seen at both lower lungs. There is new hazy  airspace opacity seen at the left lung base. No acute osseous abnormality. IMPRESSION: Increased hazy airspace opacity at the left lung base which could be due to atelectasis and/or infectious etiology. Probable chronic interstitial opacities at both lungs. Electronically Signed   By: Prudencio Pair M.D.   On: 06/25/2020 03:47   DG Chest 2 View  Result Date: 06/19/2020 CLINICAL DATA:  Suspect sepsis. EXAM: CHEST - 2 VIEW COMPARISON:  03/25/2019 FINDINGS: Heart size and vascularity normal. Mild bibasilar atelectasis. Small right effusion. Elevated left hemidiaphragm with bowel gas below the left diaphragm. Mild hyperinflation lungs. No mass lesion. No acute skeletal abnormality. IMPRESSION: Mild bibasilar atelectasis and small right effusion.   COPD. Electronically Signed   By: Franchot Gallo M.D.   On: 06/02/2020 13:35   CT Head Wo Contrast  Result Date: 06/16/2020 CLINICAL DATA:  Altered mental status. EXAM: CT HEAD WITHOUT CONTRAST TECHNIQUE: Contiguous axial images were obtained from the base of the skull through the vertex without intravenous contrast. COMPARISON:  March 16, 2018 FINDINGS: Brain: There is mild cerebral atrophy with widening of the extra-axial spaces and ventricular dilatation. There are areas of decreased attenuation within the white matter tracts of the supratentorial brain, consistent with microvascular disease changes. Vascular: No hyperdense vessel or unexpected calcification. Skull: Normal. Negative for fracture or focal lesion. Sinuses/Orbits: Very mild, bilateral  ethmoid sinus mucosal thickening is seen. Other: None. IMPRESSION: 1. Generalized cerebral atrophy. 2. Very mild, bilateral ethmoid sinus disease. 3. No acute intracranial abnormality. Electronically Signed   By: Virgina Norfolk M.D.   On: 06/06/2020 21:11   MR PELVIS WO CONTRAST  Result Date: 06/24/2020 CLINICAL DATA:  Possible rectal mass on CT EXAM: MRI PELVIS WITHOUT CONTRAST TECHNIQUE: Multiplanar multisequence MR imaging of the pelvis was performed. No intravenous contrast was administered. Small amount of [place] gel was administered per rectum to optimize tumor evaluation. COMPARISON:  CT abdomen/pelvis dated 06/15/2020 FINDINGS: Urinary Tract:  Bladder is within normal limits. Bowel: Distension of the rectum with ultrasound gel. No rectal mass is seen. Visualized bowel is otherwise unremarkable, including the sigmoid colon. Vascular/Lymphatic: No evidence of aneurysm. No suspicious pelvic lymphadenopathy. Reproductive:  Suspected postsurgical changes related to prior TURP. Other:  No pelvic ascites. Musculoskeletal: Mild degenerative changes of the lower lumbar spine. IMPRESSION: No rectosigmoid mass is evident on MR. Negative pelvic MRI. Electronically Signed   By: Julian Hy M.D.   On: 06/24/2020 12:15   MR ABDOMEN W WO CONTRAST  Result Date: 06/24/2020 CLINICAL DATA:  Inpatient. Frequent UTI. COPD and pulmonary fibrosis with chronic respiratory failure. Confusion. Concern for rectal mass on recent CT. EXAM: MRI ABDOMEN WITHOUT AND WITH CONTRAST TECHNIQUE: Multiplanar multisequence MR imaging of the abdomen was performed both before and after the administration of intravenous contrast. CONTRAST:  7.36mL GADAVIST GADOBUTROL 1 MMOL/ML IV SOLN COMPARISON:  06/20/2020 CT abdomen/pelvis. FINDINGS: Lower chest: Small dependent bilateral pleural effusions, right greater than left. Hepatobiliary: Normal liver size and configuration. No hepatic steatosis. No liver mass. Cholecystectomy. Bile ducts are  within normal post cholecystectomy limits. Common bile duct diameter 5 mm. No choledocholithiasis. No biliary masses, strictures or beading. Probable 12 mm diameter cystic duct remnant in the porta hepatis (series 4/image 14). Pancreas: No pancreatic mass or duct dilation.  No pancreas divisum. Spleen: Normal size. No mass. Adrenals/Urinary Tract: Normal adrenals. No hydronephrosis. Scattered simple subcentimeter renal cortical cysts in both kidneys. No suspicious renal masses. Stomach/Bowel: Normal non-distended stomach. Visualized small and large bowel is normal caliber, with no  bowel wall thickening. Vascular/Lymphatic: Atherosclerotic nonaneurysmal abdominal aorta. Patent portal, splenic, hepatic and renal veins. Retroaortic left renal vein. No pathologically enlarged lymph nodes in the abdomen. Other: No abdominal ascites or focal fluid collection. Musculoskeletal: No aggressive appearing focal osseous lesions. Mild L4 vertebral compression fracture, chronic appearing. IMPRESSION: 1. No lymphadenopathy or other findings to suggest metastatic disease in the abdomen. MRI pelvis to be reported separately. 2. Bile ducts are within normal post cholecystectomy limits (CBD diameter 5 mm). No choledocholithiasis. Probable small cystic duct remnant in the porta hepatis. 3. Small dependent bilateral pleural effusions, right greater than left. 4. Mild L4 vertebral compression fracture, chronic appearing. Electronically Signed   By: Ilona Sorrel M.D.   On: 06/24/2020 07:01   CT ABDOMEN PELVIS W CONTRAST  Result Date: 06/02/2020 CLINICAL DATA:  Abdominal pain. EXAM: CT ABDOMEN AND PELVIS WITH CONTRAST TECHNIQUE: Multidetector CT imaging of the abdomen and pelvis was performed using the standard protocol following bolus administration of intravenous contrast. CONTRAST:  140mL OMNIPAQUE IOHEXOL 300 MG/ML  SOLN COMPARISON:  None. FINDINGS: Lower chest: A trace amount of atelectasis is seen within the right lung base. A  very small right pleural effusion is noted. Hepatobiliary: No focal liver abnormality is seen. Status post cholecystectomy. No biliary dilatation. Pancreas: Unremarkable. No pancreatic ductal dilatation or surrounding inflammatory changes. Spleen: Normal in size without focal abnormality. Adrenals/Urinary Tract: Adrenal glands are unremarkable. Kidneys are normal, without renal calculi, focal lesion, or hydronephrosis. Bladder is unremarkable. Stomach/Bowel: Stomach is within normal limits. Appendix appears normal. No evidence of bowel dilatation. There is marked severity thickening of the rectum and adjacent portion of the distal sigmoid colon (axial CT images 71 through 75, CT series number 2). This is slightly asymmetric in appearance, right greater than left. No associated inflammatory fat stranding is seen. Vascular/Lymphatic: There is marked severity calcification and atherosclerosis of the abdominal aorta and bilateral common iliac arteries, without evidence of aneurysmal dilatation. No enlarged abdominal or pelvic lymph nodes. Reproductive: The prostate gland is mildly enlarged. Other: No abdominal wall hernia or abnormality. No abdominopelvic ascites. Musculoskeletal: Moderate to marked severity multilevel degenerative changes seen throughout the lumbar spine. IMPRESSION: 1. Asymmetric of the rectum and adjacent portion of the sigmoid colon, which may be secondary to an underlying mass. Correlation with physical examination and MRI is recommended. 2. Evidence of prior cholecystectomy. 3. Very small right pleural effusion. 4. Mildly enlarged prostate gland. 5. Moderate to marked severity multilevel degenerative changes throughout the lumbar spine. 6. Aortic atherosclerosis. Aortic Atherosclerosis (ICD10-I70.0). Electronically Signed   By: Virgina Norfolk M.D.   On: 06/15/2020 21:22   DG Chest Port 1 View  Result Date: 07/02/2020 CLINICAL DATA:  Abnormal chest x-ray. EXAM: PORTABLE CHEST 1 VIEW  COMPARISON:  06/27/2020 FINDINGS: Underlying COPD with hyperinflation. Superimposed bibasilar airspace disease demonstrates interval improvement from the prior study. No pleural effusion or pneumothorax. Heart size and vascularity normal. Atherosclerotic calcification aortic arch. IMPRESSION: Interval improvement in bibasilar atelectasis/infiltrate. Electronically Signed   By: Franchot Gallo M.D.   On: 07/02/2020 08:08   DG Chest Port 1 View  Result Date: 06/27/2020 CLINICAL DATA:  Altered mental status EXAM: PORTABLE CHEST 1 VIEW COMPARISON:  06/25/2020 FINDINGS: Chronic interstitial prominence. Improved aeration at the left lung base. No pleural effusion or pneumothorax. Stable cardiomediastinal contours. IMPRESSION: Improved aeration at the left lung base since 06/25/2020. No new findings. Electronically Signed   By: Macy Mis M.D.   On: 06/27/2020 08:07   DG Chest Port 1  View  Result Date: 06/24/2020 CLINICAL DATA:  Shortness of breath, pulmonary fibrosis, altered mental status EXAM: PORTABLE CHEST 1 VIEW COMPARISON:  05/29/2020 FINDINGS: Mild cardiomegaly. Emphysema. Mild, diffuse interstitial opacity. Bibasilar fibrotic scarring. The visualized skeletal structures are unremarkable. IMPRESSION: Emphysema and bibasilar fibrotic change with mild, diffuse interstitial opacity, potentially edema. No focal airspace opacity. Electronically Signed   By: Eddie Candle M.D.   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    Patient/Family are satisfied with care plan and all questions have been answered.  This document was prepared using Dragon voice recognition software and may include unintentional dictation errors.     Ottie Glazier, M.D.  Division of [place]

## 2020-07-03 NOTE — TOC Initial Note (Signed)
Transition of Care [facility]) - Initial/Assessment Note    Patient Details  Name: Eric Li MRN: [id number] Date of Birth: 1934/10/17  Transition of Care [facility]) CM/SW Contact:    Eileen Stanford, LCSW Phone Number: 07/03/2020, 3:31 PM  Clinical Narrative:    Pt only alert to self and place. CSW met with pt's daughter and spouse at bedside. They have chosen to take pt home with home health services. Pt's daughter used to be a Marine scientist. Pt's daughter states she is not taking the pt home until [facility] and DME is arranged. Pt will get HH through [facility]. Pt will also get DME through [employer]. Equipment will be delivered to the home tomorrow. MD made aware. Pt has 02 through [place].  Plan is for pt to d/c home tomorrow after equipment is delievered.             Expected Discharge Plan: [place] Barriers to Discharge: Continued Medical Work up   Patient Goals and CMS Choice Patient states their goals for this hospitalization and ongoing recovery are:: to get pt home   Choice offered to / list presented to : Spouse, Adult Children  Expected Discharge Plan and Services Expected Discharge Plan: [place] In-house Referral: Clinical Social Work   Post Acute Care Choice: Home Health, Durable Medical Equipment Living arrangements for the past 2 months: [place]                 DME Arranged: Hospital bed, Wheelchair manual, 3-N-1 DME Agency: [facility] Date DME Agency Contacted: 07/03/20 Time DME Agency Contacted: [phone] Representative spoke with at DME Agency: Thedore Mins HH Arranged: PT, OT, RN, Nurse's Aide, Social Work [employer] Agency: [facility] Date [place]: 07/03/20 Time [place]: 1530 Representative spoke with at [place]: brittany  Prior Living Arrangements/Services Living arrangements for the past 2 months: [place] Lives with:: Spouse Patient language and need for interpreter reviewed:: Yes Do you feel safe  going back to the place where you live?: Yes      Need for Family Participation in Patient Care: Yes (Comment) Care giver support system in place?: Yes (comment)   Criminal Activity/Legal Involvement Pertinent to Current Situation/Hospitalization: No - Comment as needed  Activities of Daily Living      Permission Sought/Granted Permission sought to share information with : Family Supports Permission granted to share information with : Yes, Verbal Permission Granted  Share Information with NAME: [name]     Permission granted to share info w Relationship: daughter     Emotional Assessment Appearance:: Appears stated age Attitude/Demeanor/Rapport: Unable to Assess Affect (typically observed): Unable to Assess Orientation: : Oriented to Self, Oriented to Place Alcohol / Substance Use: Not Applicable Psych Involvement: No (comment)  Admission diagnosis:  Other constipation [K59.09] Lactic acidosis [E87.2] UTI (urinary tract infection) [N39.0] Paranoia (HCC) [S31] Complicated UTI (urinary tract infection) [N39.0] Patient Active Problem List   Diagnosis Date Noted  . Goals of care, counseling/discussion   . Palliative care by specialist   . DNR (do not resuscitate)   . UTI (urinary tract infection) 06/15/2020  . Acute metabolic encephalopathy 59/45/8592  . Abnormal computed tomography angiography (CTA) of abdomen and pelvis 06/15/2020  . Pulmonary fibrosis ([place]) 02/08/2020  . Chronic respiratory failure with hypoxia ([place]) 02/08/2020  . PAF (paroxysmal atrial fibrillation) ([place]) 10/14/2019  . GAD (generalized anxiety disorder) 10/14/2019  . Chest pain 03/25/2019  . Elevated PSA 03/18/2019  .  Atrial flutter ([place]) 11/08/2018  . Memory problem 09/09/2018  . Psychophysiologic insomnia 06/22/2018  . Carotid stenosis 04/02/2018  . PAD (peripheral artery disease) ([place]) 04/02/2018  . Syncope 03/15/2018  . Bilateral hearing loss 03/03/2016  . COPD, moderate ([place]) 08/29/2015  .  COPD, severe ([place]) 08/29/2015  . Statin intolerance 03/30/2015  . SOB (shortness of breath) on exertion 03/27/2015  . CAD (coronary artery disease) 02/24/2014  . Hemorrhoids 02/24/2014  . HTN (hypertension) 02/24/2014  . Hyperlipidemia, unspecified 02/24/2014   PCP:  [name], MD Pharmacy:   [id number] [place] [id number]: [phone]: [phone]    Social Determinants of Health (SDOH) Interventions    Readmission Risk Interventions No flowsheet data found.

## 2020-07-03 NOTE — Progress Notes (Signed)
PROGRESS NOTE    Eric Li  MIW:[id number] DOB: [birth year] DOA: 05/30/2020 PCP: [name], MD   Brief Narrative: Taken from prior notes. Eric Li a 85 year old male with history of paroxysmal A. fib on apixaban, HTN, CAD, frequent UTIs, COPD and pulmonary fibrosis with chronic respiratory failure on home O2 at 3 L presenting with altered mental status secondary to UTI.  Completed treatment with ceftriaxone. Developed acute on chronic hypoxic respiratory failure secondary to exacerbation of his pulmonary fibrosis requiring heated HFNC. Pulmonary was consulted.  Subjective: Patient has no new complaint today.,  Sitting in his bed.  Saturating well on 4 L.  Per daughter he ate his breakfast good this morning.  Wants to go home. Waiting for DME supplies to be delivered which might not happen today  Assessment & Plan:   Principal Problem:   Acute metabolic encephalopathy Active Problems:   CAD (coronary artery disease)   HTN (hypertension)   UTI (urinary tract infection)   PAF (paroxysmal atrial fibrillation) (HCC)   Pulmonary fibrosis (HCC)   Chronic respiratory failure with hypoxia (HCC)   COPD, severe (HCC)   Abnormal computed tomography angiography (CTA) of abdomen and pelvis   Goals of care, counseling/discussion   Palliative care by specialist   DNR (do not resuscitate)  Acute on chronic respiratory failure with hypoxia.   secondary to pulmonary fibrosis exacerbation.   Chronic hypoxic respiratory failure on 3-4L O2 baseline Pulmonary was consulted and they started him on steroid along with diuresis.  Chest x-ray done on 06/25/2020 with left basilar opacity with concern of aspiration pneumonia.  Patient completed a course with ceftriaxone.  Subsequent chest x-ray with some improvement in that infiltrate. Today he was saturating well close to his home oxygen requirement. Baseline oxygen requirement of 3-4 L. PLAN: --continue steroid taper -decreased to 30  mg on 07/03/20.. -Dr. Lanney Gins following -Continue with DuoNeb. -Continue with MetaNeb. -continue IV lasix 20 mg BID -Continue with supplemental oxygen at 4 L. -Palliative care was also consulted and after discussing with family they are recommending outpatient palliative care which can be transition to hospice if he continued to deteriorate at home. -Patient and family wants to go home with maximum support-ordered maximum home health services and DME needs, ordered yesterday, no delivery yet.  Dysphagia.   Poor oral intake Daughter was concerned that he is having difficulty swallowing the food which he was used to take it without difficulty before.  Patient does not have his teeth. -Swallow evaluation  PLAN: --Dys1 diet, per SLP rec --Ensure BID  Sepsis secondary to UTI.  Resolved. Initially met sepsis criteria with UTI.  Urine culture grew E. coli which were pretty pansensitive except quinolones.  Completed a course of ceftriaxone.  Encephalopathy.  Some waxing and waning in his mental status. High risk for delirium and sundowning secondary to his age and underlying dementia. He was alert and oriented to self only, able to recognize daughter and communicate with her. -Dementia precautions. -Continue to monitor. -Palliative care was also consulted-patient is DNR and will continue current scope of care.  Oral thrush.  Oral thrush was noted on 06/27/20.  Patient is on steroid.  Improved. -Continue with Magic mouthwash as needed.  History of coronary artery disease.  No chest pain or acute EKG changes. -cont Toprol  Hypertension.   BP 90's-120's -cont cardizem and Toprol since pt needs it for rate control -continue IV lasix for diuresis  Paroxysmal atrial fibrillation.  Patient was stable in 60s heart  rate when seen this morning.  Later converted to A. fib with RVR and sustaining heart rate in 120s. --HR variable PLAN: --cont cardizem 360 mg daily --cont Toprol 25 mg  daily -Continue with Eliquis.  Incidental abnormal computed tomography angiography (CTA) of abdomen and pelvis -Followed by MRI, no mass was identified -MRI of abdomen pelvis completed on 06/23/2020 -no comments on sigmoid mass -GI consult has been canceled.  Hx of pre-diabetes Hyperglycemia 2/2 steroid use --SSI  Constipation -cont Miralax 34g BID   Objective: Vitals:   07/03/20 0750 07/03/20 0755 07/03/20 0946 07/03/20 1128  BP:  104/75  114/75  Pulse: (!) 116 (!) 54 97 99  Resp: (!) 34 (!) 22  (!) 22  Temp:  97.7 F (36.5 C)  97.6 F (36.4 C)  TempSrc:  Oral  Oral  SpO2: (!) 86% 91%  90%  Weight:      Height:        Intake/Output Summary (Last 24 hours) at 07/03/2020 1504 Last data filed at 07/03/2020 1435 Gross per 24 hour  Intake 240 ml  Output 1550 ml  Net -1310 ml   Filed Weights   06/30/20 0444 07/02/20 0406 07/03/20 0408  Weight: 53.9 kg 49.7 kg 50 kg    General.  Frail and pleasant elderly man, in no acute distress. Pulmonary.  Lungs clear bilaterally, normal respiratory effort. CV.  Regular rate and rhythm, no JVD, rub or murmur. Abdomen.  Soft, nontender, nondistended, BS positive. CNS.  Alert and oriented .  No focal neurologic deficit. Extremities.  No edema, no cyanosis, pulses intact and symmetrical. Psychiatry.  Judgment and insight appears normal.  DVT prophylaxis: Eliquis Code Status: DNR Family Communication: Daughter was updated at bedside. Disposition Plan:  Status is: Inpatient  Remains inpatient appropriate because:Inpatient level of care appropriate due to severity of illness   Dispo: The patient is from: Home              Anticipated d/c is to: Home with home health.              Anticipated d/c date is: 1 days.              Patient currently medically stable.  Waiting for DME supplies to be delivered which might not happen today.   patient is high risk for deterioration and death secondary to advanced age and multiple  underlying comorbidities.     Consultants:   Pulmonary  Palliative care  Procedures:  Antimicrobials:   Data Reviewed: I have personally reviewed following labs and imaging studies  CBC: Recent Labs  Lab 06/28/20 0609 07/01/20 0536 07/02/20 0508 07/03/20 0415  WBC 16.8* 22.1* 20.0* 17.3*  HGB 14.7 15.3 15.4 14.9  HCT 44.9 46.2 46.2 46.1  MCV 90.7 89.4 89.0 92.6  PLT 241 257 266 035   Basic Metabolic Panel: Recent Labs  Lab 06/29/20 0455 06/30/20 0432 07/01/20 0536 07/02/20 0508 07/03/20 0415  NA 134* 132* 137 138 136  K 3.6 3.5 3.3* 4.0 4.5  CL 96* 94* 97* 99 97*  CO2 '27 29 28 31 27  ' GLUCOSE 119* 181* 117* 114* 221*  BUN 31* 22 27* 37* 29*  CREATININE 0.72 0.79 0.75 0.88 0.86  CALCIUM 8.4* 8.3* 8.7* 8.9 8.9  MG  --   --  2.4 2.4 2.3   GFR: Estimated Creatinine Clearance: 46 mL/min (by C-G formula based on SCr of 0.86 mg/dL). Liver Function Tests: No results for input(s): AST, ALT, ALKPHOS, BILITOT, PROT, ALBUMIN  in the last 168 hours. No results for input(s): LIPASE, AMYLASE in the last 168 hours. No results for input(s): AMMONIA in the last 168 hours. Coagulation Profile: No results for input(s): INR, PROTIME in the last 168 hours. Cardiac Enzymes: No results for input(s): CKTOTAL, CKMB, CKMBINDEX, TROPONINI in the last 168 hours. BNP (last 3 results) No results for input(s): PROBNP in the last 8760 hours. HbA1C: No results for input(s): HGBA1C in the last 72 hours. CBG: Recent Labs  Lab 07/02/20 1256 07/02/20 1609 07/02/20 2016 07/03/20 0754 07/03/20 1126  GLUCAP 179* 187* 184* 192* 245*   Lipid Profile: No results for input(s): CHOL, HDL, LDLCALC, TRIG, CHOLHDL, LDLDIRECT in the last 72 hours. Thyroid Function Tests: No results for input(s): TSH, T4TOTAL, FREET4, T3FREE, THYROIDAB in the last 72 hours. Anemia Panel: No results for input(s): VITAMINB12, FOLATE, FERRITIN, TIBC, IRON, RETICCTPCT in the last 72 hours. Sepsis Labs: No results  for input(s): PROCALCITON, LATICACIDVEN in the last 168 hours.  Recent Results (from the past 240 hour(s))  Urine Culture     Status: Abnormal   Collection Time: 06/23/20  3:07 PM   Specimen: Urine, Random  Result Value Ref Range Status   Specimen Description   Final    URINE, RANDOM Performed at [facility], [address] [place], [place] [address]    Special Requests   Final    NONE Performed at [facility], [address] [place],  [address]    Culture MULTIPLE SPECIES PRESENT, SUGGEST RECOLLECTION (A)  Final   Report Status 06/25/2020 FINAL  Final     Radiology Studies: DG Chest Port 1 View  Result Date: 07/02/2020 CLINICAL DATA:  Abnormal chest x-ray. EXAM: PORTABLE CHEST 1 VIEW COMPARISON:  06/27/2020 FINDINGS: Underlying COPD with hyperinflation. Superimposed bibasilar airspace disease demonstrates interval improvement from the prior study. No pleural effusion or pneumothorax. Heart size and vascularity normal. Atherosclerotic calcification aortic arch. IMPRESSION: Interval improvement in bibasilar atelectasis/infiltrate. Electronically Signed   By: Franchot Gallo M.D.   On: 07/02/2020 08:08    Scheduled Meds: . acidophilus  1 capsule Oral Daily  . apixaban  2.5 mg Oral BID  . diltiazem  360 mg Oral Daily  . escitalopram  10 mg Oral Daily  . feeding supplement (ENSURE ENLIVE)  237 mL Oral BID BM  . fluticasone  2 spray Each Nare Daily  . furosemide  20 mg Intravenous BID  . insulin aspart  0-9 Units Subcutaneous TID WC  . magic mouthwash  15 mL Oral TID  . metoprolol succinate  25 mg Oral Daily  . mometasone-formoterol  2 puff Inhalation BID  . multivitamin with minerals  1 tablet Oral Daily  . nystatin ointment   Topical BID  . pantoprazole  40 mg Oral Daily  . polyethylene glycol  34 g Oral BID  . [START ON 07/04/2020] potassium chloride  20 mEq Oral Daily  . predniSONE  20 mg Oral Q breakfast   Continuous Infusions:   LOS: 12  days    Lorella Nimrod, MD [facility]  If 7PM-7AM, please contact night-coverage [email or web address]  07/03/2020, 3:04 PM

## 2020-07-03 NOTE — Progress Notes (Addendum)
Occupational Therapy Treatment Patient Details Name: JEREMIA Li MRN: [id number] DOB: [birth year] Today's Date: 07/03/2020    History of present illness Eric Li is an 85 y/o male who was admitted with chief complaint of abdominal pain and confusion. Pt found to have a UTI. PMH includes paroxysmal A-fib on Apixaban, HTN, HLD, CAD, CHF, COPD, pulmonary fibrosis with chronic respiratory failure, on home O2 at 3L, bilateral hearing loss, nephrolitiasis, SOB on exertion, and statin intolerance.   OT comments  Eric Li was seen for OT treatment on this date. Upon arrival to room pt seated upright in bed eating breakfast c SETUP. Additional family member at bedside instructed in DME recs, home/routines modifications, importance of OOB mobility for functional strengthening, falls prevention, and ECS. Pt reports need for BM, agreeable to attempt BSC. Pt required MOD A for bed mobility, assist for trunk support only. Pt demonstrates improved sitting balance requiring MIN A static sitting at EOB, intermittently CGA. MAX A for squat pivot t/f bed<>BSC - required B knee block. SBA for sitting balance on BSC; MAX A x2 for perihygiene in standing. SpO2 stable 92% on 4L [place] t/o toileting t/f. Pt making good progress toward goals. Pt continues to benefit from skilled OT services to maximize return to PLOF and minimize risk of future falls, injury, caregiver burden, and readmission. Will continue to follow POC. Discharge recommendation remains appropriate.   Follow Up Recommendations  SNF;Supervision/Assistance - 24 hour    Equipment Recommendations  3 in 1 bedside commode;Hospital bed;Other (comment)    Recommendations for Other Services      Precautions / Restrictions Precautions Precautions: Fall Restrictions Weight Bearing Restrictions: No       Mobility Bed Mobility Overal bed mobility: Needs Assistance Bed Mobility: Sit to Supine;Supine to Sit     Supine to sit: Mod assist Sit to  supine: Mod assist   General bed mobility comments: Vcs for sequencing. Assist for trunk control   Transfers Overall transfer level: Needs assistance   Transfers: Squat Pivot Transfers     Squat pivot transfers: Max assist     General transfer comment: B knee block squat pivot t/f bed<>BSC    Balance Overall balance assessment: Needs assistance Sitting-balance support: Feet supported;Bilateral upper extremity supported Sitting balance-Leahy Scale: Fair Sitting balance - Comments: MIN A sitting balance   Standing balance support: Bilateral upper extremity supported Standing balance-Leahy Scale: Poor Standing balance comment: B knee block, unable to achieve upright posture, tolerates ~90" standing          ADL either performed or assessed with clinical judgement   ADL Overall ADL's : Needs assistance/impaired        General ADL Comments: SETUP self-feeding at bed level. MAX A don B socks at bed level. MAX A for BSC t/f. MAX A x2 for perihygiene in standing               Cognition Arousal/Alertness: Awake/alert Behavior During Therapy: WFL for tasks assessed/performed Overall Cognitive Status: History of cognitive impairments - at baseline           Exercises Exercises: Other exercises Other Exercises Other Exercises: Pt and carefiver educated re: DME recs, d/c recs, home/routines modifications, safe t/f techniques Other Exercises: LBD, toileting, self-feeding, sup<>sit, SPT, sitting/standing balance/tolerance           Pertinent Vitals/ Pain       Pain Assessment: Faces Faces Pain Scale: Hurts little more Pain Location: back Pain Descriptors / Indicators: Dull;Grimacing Pain Intervention(s): Limited activity within  patient's tolerance         Frequency  Min 1X/week        Progress Toward Goals  OT Goals(current goals can now be found in the care plan section)  Progress towards OT goals: Progressing toward goals  Acute Rehab OT  Goals Patient Stated Goal: to go home - not to go to a facility unless absolutely necessary OT Goal Formulation: With patient/family Time For Goal Achievement: 07/11/20 Potential to Achieve Goals: Fair ADL Goals Pt Will Perform Grooming: sitting;with min assist Pt Will Transfer to Toilet: squat pivot transfer;bedside commode;with min assist Pt Will Perform Toileting - Clothing Manipulation and hygiene: with min assist;sitting/lateral leans;with caregiver independent in assisting;with adaptive equipment (LRAD PRN for improved safety and functional independence.)  Plan Discharge plan remains appropriate;Frequency remains appropriate       AM-PAC OT "6 Clicks" Daily Activity     Outcome Measure   Help from another person eating meals?: A Little Help from another person taking care of personal grooming?: A Little Help from another person toileting, which includes using toliet, bedpan, or urinal?: A Lot Help from another person bathing (including washing, rinsing, drying)?: A Lot Help from another person to put on and taking off regular upper body clothing?: A Lot Help from another person to put on and taking off regular lower body clothing?: A Lot 6 Click Score: 14    End of Session Equipment Utilized During Treatment: Oxygen  OT Visit Diagnosis: Other abnormalities of gait and mobility (R26.89);Muscle weakness (generalized) (M62.81);Other symptoms and signs involving cognitive function   Activity Tolerance Patient tolerated treatment well   Patient Left in bed;with call bell/phone within reach;with family/visitor present (bed in chair position)   Nurse Communication Mobility status        Time: 9038-3338 OT Time Calculation (min): 46 min  Charges: OT General Charges $OT Visit: 1 Visit OT Treatments $Self Care/Home Management : 23-37 mins $Therapeutic Activity: 8-22 mins  Dessie Coma, M.S. OTR/L  07/03/20, 12:20 PM  ascom [phone]

## 2020-07-03 NOTE — Clinical Social Work Note (Signed)
    Durable Medical Equipment  (From admission, onward)         Start     Ordered   07/02/20 1614  For home use only DME 3 n 1  Once        07/02/20 1613   07/02/20 1613  For home use only DME lightweight manual wheelchair with seat cushion  Once       Comments: Patient suffers from paroxysmal A. fib on apixaban, HTN, CAD, frequent UTIs, COPD and pulmonary fibrosis with chronic respiratory failure on home O2 at 4-5 L  which impairs their ability to perform daily activities like bathing, dressing, feeding, grooming, and toileting in the home.  A walker will not resolve  issue with performing activities of daily living. A wheelchair will allow patient to safely perform daily activities. Patient is not able to propel themselves in the home using a standard weight wheelchair due to general weakness. Patient can self propel in the lightweight wheelchair. Length of need Lifetime. Accessories: elevating leg rests (ELRs), wheel locks, extensions and anti-tippers.   07/02/20 1613   07/02/20 1612  For home use only DME oxygen  Once       Question Answer Comment  Length of Need Lifetime   Mode or (Route) Mask   Liters per Minute 5   Frequency Continuous (stationary and portable oxygen unit needed)   Oxygen conserving device Yes   Oxygen delivery system Gas      07/02/20 1613   07/02/20 1609  For home use only DME Hospital bed  Once       Question Answer Comment  Length of Need Lifetime   Patient has (list medical condition): paroxysmal A. fib on apixaban, HTN, CAD, frequent UTIs, COPD and pulmonary fibrosis with chronic respiratory failure on home O2 at 4 L   The above medical condition requires: Patient requires the ability to reposition frequently   Bed type Semi-electric   Hoyer Lift Yes   Support Surface: Gel Overlay      07/02/20 1613

## 2020-07-03 NOTE — Progress Notes (Signed)
pt's sats dropped to 82-85% on 4L, o2 turned up to 6L, with no improvement. RT called, placed pt back on heated high flow at 50L and 80%, now sating 98%. MD made aware, new orders for chest xray.will continue to monitor.

## 2020-07-04 ENCOUNTER — Encounter: Payer: Self-pay | Admitting: Internal Medicine

## 2020-07-04 ENCOUNTER — Inpatient Hospital Stay: Payer: PPO

## 2020-07-04 DIAGNOSIS — J449 Chronic obstructive pulmonary disease, unspecified: Secondary | ICD-10-CM | POA: Diagnosis not present

## 2020-07-04 DIAGNOSIS — J9611 Chronic respiratory failure with hypoxia: Secondary | ICD-10-CM | POA: Diagnosis not present

## 2020-07-04 DIAGNOSIS — G9341 Metabolic encephalopathy: Secondary | ICD-10-CM | POA: Diagnosis not present

## 2020-07-04 DIAGNOSIS — J8410 Pulmonary fibrosis, unspecified: Secondary | ICD-10-CM | POA: Diagnosis not present

## 2020-07-04 LAB — GLUCOSE, CAPILLARY
Glucose-Capillary: 114 mg/dL — ABNORMAL HIGH (ref 70–99)
Glucose-Capillary: 102 mg/dL — ABNORMAL HIGH (ref 70–99)
Glucose-Capillary: 191 mg/dL — ABNORMAL HIGH (ref 70–99)
Glucose-Capillary: 120 mg/dL — ABNORMAL HIGH (ref 70–99)

## 2020-07-04 IMAGING — DX DG CHEST 1V PORT
1 series · 1 of 1 positions shown · non-contrast
Comparison: Portable chest [DATE] and earlier.

CLINICAL DATA: 85-year-old male with recent hypoxia. Shortness of
breath.

EXAM:
PORTABLE CHEST 1 VIEW

[chest ap]
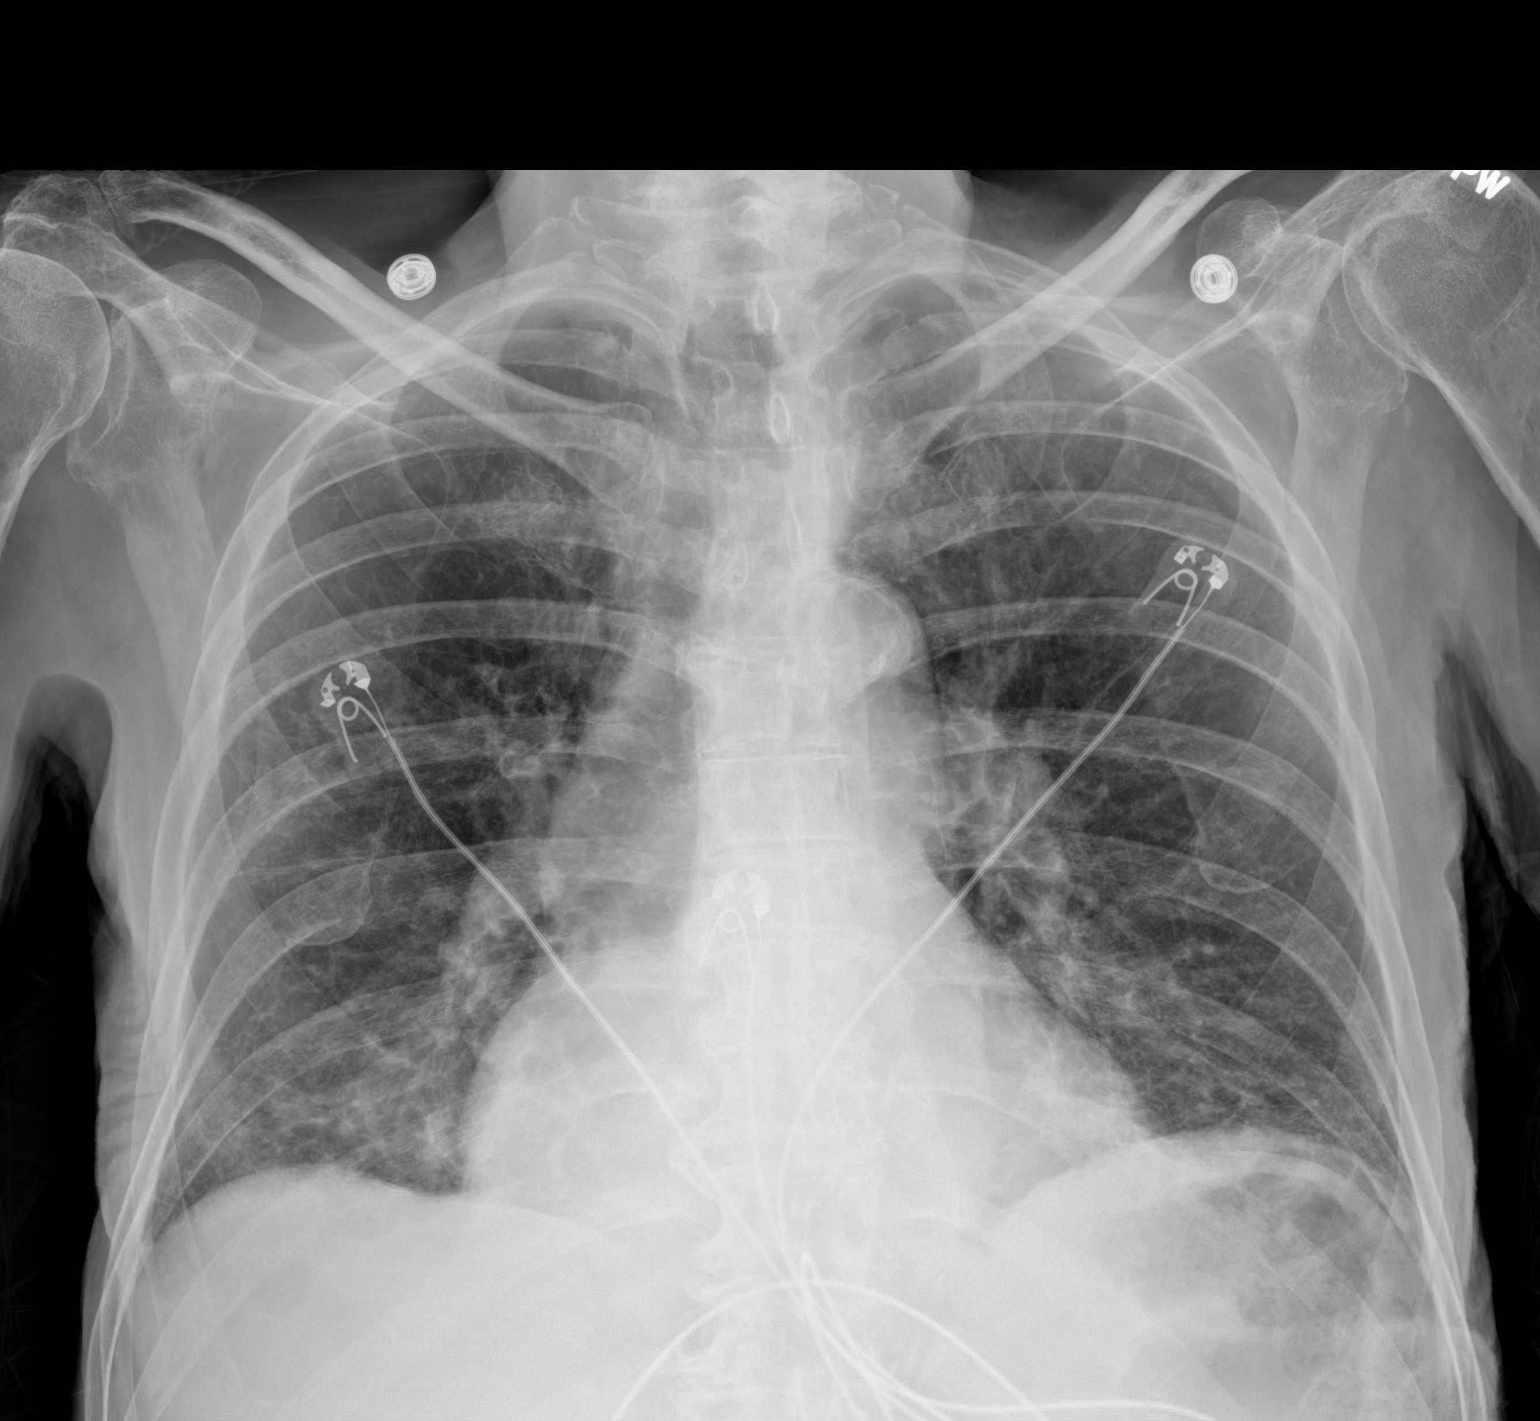

[1 of 1 positions shown; findings below may reference images not displayed]

FINDINGS: Portable AP upright view at [7J] hours. Diffuse emphysema
demonstrated on chest CT [DATE]. Associated mild bilateral
basilar predominant increased interstitial markings appear stable
across this series of exams. Stable lung volumes and mediastinal
contours. No pneumothorax, pulmonary edema, pleural effusion or
acute pulmonary opacity. No acute osseous abnormality identified.
Negative visible bowel gas pattern.
IMPRESSION: No acute cardiopulmonary abnormality.  Emphysema ([7J]-[7J]).

## 2020-07-04 NOTE — Progress Notes (Signed)
Pulmonary Medicine          Date: 07/04/2020,   MRN# [id number] TALHA ISER [birth year]     AdmissionWeight: 53.5 kg                 CurrentWeight: 50.3 kg   Referring physician: Dr Roger Shelter   CHIEF COMPLAINT:   Acute hypoxemic respiratory failure   HISTORY OF PRESENT ILLNESS   As per admission h/p LEDON WEIHE is a 85 y.o. male with medical history significant for paroxysmal A. fib on apixaban, HTN, CAD, COPD and pulmonary fibrosis with chronic respiratory failure on home O2 at 3 L, who was brought to the emergency room by EMS because of confusion. Family called with concerns for patient waking up disoriented believing he was somewhere else and appeared to be not acting himself and " talking out of his head". Wife also voiced concern for possible UTI as his urination was frequent and with strong odor. He has had no fever or chills, or chest pain or shortness of breath. Has had no nausea, vomiting or change in bowel habits. He is fully vaccinated against Covid. On arrival, his vitals were within normal limits without fever or tachycardia and with O2 sat 97% on O2 at home flow rate . Blood work was significant for WBC of 11,700 and lactic acid of 2>>1.6. Urinalysis showed pyuria. Other blood work mostly unremarkable.  EKG  NSR with RBBB with nonspecific ST-T wave changes  Chest x-ray showed no acute findings, head CT no acute findings. Due to patient complaint of abdominal pain CT abdomen and pelvis with contrast was done which showed asymmetry of the rectum and adjacent portion of the sigmoid colon. Pulmonary consultation for worsening dyspnea with acute on chronic hypoxemic respiratory failure.   06/25/20- patient evaluated at bedside this afternoon.  He had repeat CXR overnight with worsening LLL infiltrate.  Family member at bedside (daughter) shares that he had desatutation event after eating. This suggests aspiration. He made adequate urine with lasix with notable  improvement after diuresis as per family.  MetNEB was unable to be used due to patient being sleepy unable to participate.    06/26/20-  Patient with mild imrpovement.  Noted Palliative evaluation appreciate input. Will institute OT/PT starting tommorow.  Contiue gentle diuresis and MetaNEB therapy as well as current antimicrobials.   06/27/20- Patient seen and examined this morning he seems confused.  Granddaughter at bedside during my examination states he has been having similar symptoms for past 1/2 year and thoughts were revolving around dementia workup. This seems to be episode of sundowning. Patient was significantly more oriented and lucid yesterday mid-day.  He had CXR today with good improvement of previous infiltrates.   06/28/20- patient is down to 45% 45L/min in HFNC which is significantly better then previous. He is resting in bed comfortably. Family at bedside during my evaluation.   06/29/20- patient resting in bed similar setting on HFNC. No changes in recommendations today.   07/02/20-  Net negative 8L,  Repeat CXR included below- with improvement, patient is weaned down to 30/30 on HFNC will transition to regular nasal canula today if RT is available. He was able to get OOB with RT to edge of bed, had BORG 3-4 dyspnea.   Will decrease lasix today due to euvolemic status with new onset pre renal azotemia. Overall significantly improved. Will plan for continued chest physiotherapy with MetaNEB.   07/03/20- Patient resting in bed. Family at bedside. He  is on home setting of 4L/min [place].  He is participating with PT.  Overall respiratory status is much improved. Weaning steroids prednisone taper.   07/05/20- patient had episode of respiratory distress and had to re-initiate HFNC.  He is weaned to 25/40 but saturates in 85-90%.  During my interview and examination he drank water and almost aspirated even with assistance which is concerning for aspiration. Daughter explains since he cannot wear  dentures he is unable to eat well and occasionally notes chocking on liquids and states since he is so weak aspiration has become more problematic.  Will repeat CXR today.   PAST MEDICAL HISTORY   Past Medical History:  Diagnosis Date  . Bilateral hearing loss 03/03/2016  . CAD (coronary artery disease) 02/24/2014  . CHF (congestive heart failure) ([place])   . COPD, moderate ([place]) 08/29/2015  . Hemorrhoids 02/24/2014  . HTN (hypertension) 02/24/2014  . Hyperlipidemia, unspecified 02/24/2014  . Nephrolithiasis   . SOB (shortness of breath) on exertion 03/27/2015  . Statin intolerance 03/30/2015     SURGICAL HISTORY   Past Surgical History:  Procedure Laterality Date  . CHOLECYSTECTOMY    . TONSILLECTOMY       FAMILY HISTORY   Family History  Problem Relation Age of Onset  . Bladder Cancer Neg Hx   . Prolactinoma Neg Hx   . Prostate cancer Neg Hx   . Kidney cancer Neg Hx      SOCIAL HISTORY   Social History   Tobacco Use  . Smoking status: Former Research scientist (life sciences)  . Smokeless tobacco: Never Used  Vaping Use  . Vaping Use: Never used  Substance Use Topics  . Alcohol use: No  . Drug use: No     MEDICATIONS    Home Medication:    Current Medication:  Current Facility-Administered Medications:  .  acetaminophen (TYLENOL) tablet 650 mg, 650 mg, Oral, Q6H PRN, 650 mg at 07/01/20 1703 **OR** acetaminophen (TYLENOL) suppository 650 mg, 650 mg, Rectal, Q6H PRN, Athena Masse, MD .  acidophilus (RISAQUAD) capsule 1 capsule, 1 capsule, Oral, Daily, Shahmehdi, Seyed A, MD, 1 capsule at 07/03/20 0947 .  apixaban (ELIQUIS) tablet 2.5 mg, 2.5 mg, Oral, BID, Athena Masse, MD, 2.5 mg at 07/03/20 2116 .  diclofenac Sodium (VOLTAREN) 1 % topical gel 2 g, 2 g, Topical, BID PRN, Shahmehdi, Seyed A, MD .  diltiazem (CARDIZEM CD) 24 hr capsule 360 mg, 360 mg, Oral, Daily, Lorella Nimrod, MD, 360 mg at 07/03/20 0947 .  escitalopram (LEXAPRO) tablet 10 mg, 10 mg, Oral, Daily, Judd Gaudier V, MD, 10  mg at 07/03/20 0947 .  feeding supplement (ENSURE ENLIVE) (ENSURE ENLIVE) liquid 237 mL, 237 mL, Oral, BID BM, Lorella Nimrod, MD, 237 mL at 07/03/20 1503 .  fluticasone (FLONASE) 50 MCG/ACT nasal spray 2 spray, 2 spray, Each Nare, Daily, Athena Masse, MD, 2 spray at 07/03/20 0948 .  furosemide (LASIX) injection 20 mg, 20 mg, Intravenous, BID, Enzo Bi, MD, 20 mg at 07/04/20 0617 .  insulin aspart (novoLOG) injection 0-9 Units, 0-9 Units, Subcutaneous, TID WC, Shahmehdi, Seyed A, MD, 3 Units at 07/03/20 1709 .  levalbuterol (XOPENEX) nebulizer solution 0.63 mg, 0.63 mg, Nebulization, Q6H PRN, Lang Snow, NP, 0.63 mg at 06/27/20 0845 .  magic mouthwash, 15 mL, Oral, TID, Lorella Nimrod, MD, 15 mL at 07/03/20 2119 .  metoprolol succinate (TOPROL-XL) 24 hr tablet 25 mg, 25 mg, Oral, Daily, Keelia Graybill, MD, 25 mg at 07/03/20 0946 .  mometasone-formoterol (DULERA) 200-5 MCG/ACT inhaler 2 puff, 2 puff, Inhalation, BID, Athena Masse, MD, 2 puff at 07/03/20 2117 .  multivitamin with minerals tablet 1 tablet, 1 tablet, Oral, Daily, Athena Masse, MD, 1 tablet at 07/03/20 0946 .  nitroGLYCERIN (NITROSTAT) SL tablet 0.4 mg, 0.4 mg, Sublingual, Q5 min PRN, Athena Masse, MD .  nystatin ointment (MYCOSTATIN), , Topical, BID, Enzo Bi, MD, Given at 07/03/20 2117 .  ondansetron (ZOFRAN) tablet 4 mg, 4 mg, Oral, Q6H PRN **OR** ondansetron (ZOFRAN) injection 4 mg, 4 mg, Intravenous, Q6H PRN, Athena Masse, MD .  pantoprazole (PROTONIX) EC tablet 40 mg, 40 mg, Oral, Daily, Judd Gaudier V, MD, 40 mg at 07/03/20 0947 .  polyethylene glycol (MIRALAX / GLYCOLAX) packet 34 g, 34 g, Oral, BID, Enzo Bi, MD, 34 g at 07/02/20 0940 .  potassium chloride 20 MEQ/15ML (10%) solution 20 mEq, 20 mEq, Oral, Daily, Oswald Hillock, RPH .  predniSONE (DELTASONE) tablet 20 mg, 20 mg, Oral, Q breakfast, Zayn Selley, MD, 20 mg at 07/03/20 0946 .  psyllium (HYDROCIL/METAMUCIL) 1 packet, 1 packet, Oral,  Daily PRN, Athena Masse, MD, 1 packet at 06/29/20 1028 .  traMADol (ULTRAM) tablet 50 mg, 50 mg, Oral, Q6H PRN, Shahmehdi, Seyed A, MD, 50 mg at 07/03/20 2116 .  traZODone (DESYREL) tablet 50 mg, 50 mg, Oral, QHS PRN, Shahmehdi, Seyed A, MD, 50 mg at 07/03/20 2116    ALLERGIES   Simvastatin     REVIEW OF SYSTEMS    Review of Systems:  Gen:  Denies  fever, sweats, chills weigh loss  HEENT: Denies blurred vision, double vision, ear pain, eye pain, hearing loss, nose bleeds, sore throat Cardiac:  No dizziness, chest pain or heaviness, chest tightness,edema Resp:   Denies cough or sputum porduction, shortness of breath,wheezing, hemoptysis,  Gi: Denies swallowing difficulty, stomach pain, nausea or vomiting, diarrhea, constipation, bowel incontinence Gu:  Denies bladder incontinence, burning urine Ext:   Denies Joint pain, stiffness or swelling Skin: Denies  skin rash, easy bruising or bleeding or hives Endoc:  Denies polyuria, polydipsia , polyphagia or weight change Psych:   Denies depression, insomnia or hallucinations   Other:  All other systems negative   VS: BP 103/70 (BP Location: Left Arm)   Pulse (!) 54   Temp 98 F (36.7 C) (Oral)   Resp (!) 23   Ht 5\' 6"  (1.676 m)   Wt 50.3 kg   SpO2 93%   BMI 17.92 kg/m      PHYSICAL EXAM    GENERAL:NAD, no fevers, chills, no weakness no fatigue HEAD: Normocephalic, atraumatic.  EYES: Pupils equal, round, reactive to light. Extraocular muscles intact. No scleral icterus.  MOUTH: Moist mucosal membrane. Dentition intact. No abscess noted.  EAR, NOSE, THROAT: Clear without exudates. No external lesions.  NECK: Supple. No thyromegaly. No nodules. No JVD.  PULMONARY: mild ronchi bilaterally  CARDIOVASCULAR: S1 and S2. Regular rate and rhythm. No murmurs, rubs, or gallops. No edema. Pedal pulses 2+ bilaterally.  GASTROINTESTINAL: Soft, nontender, nondistended. No masses. Positive bowel sounds. No hepatosplenomegaly.    MUSCULOSKELETAL: No swelling, clubbing, or edema. Range of motion full in all extremities.  NEUROLOGIC: Cranial nerves II through XII are intact. No gross focal neurological deficits. Sensation intact. Reflexes intact.  SKIN: No ulceration, lesions, rashes, or cyanosis. Skin warm and dry. Turgor intact.  PSYCHIATRIC: Mood, affect within normal limits. The patient is awake, alert and oriented x 3. Insight, judgment intact.  IMAGING    DG Chest 1 View  Result Date: 06/25/2020 CLINICAL DATA:  Shortness of breath EXAM: CHEST  1 VIEW COMPARISON:  June 24, 2020 FINDINGS: The heart size and mediastinal contours are unchanged with mild cardiomegaly. Aortic knob calcifications are seen. Again noted is hyperinflation of the lung zones with flattening of the hemidiaphragms. There is mildly increased interstitial opacities seen at both lower lungs. There is new hazy airspace opacity seen at the left lung base. No acute osseous abnormality. IMPRESSION: Increased hazy airspace opacity at the left lung base which could be due to atelectasis and/or infectious etiology. Probable chronic interstitial opacities at both lungs. Electronically Signed   By: Prudencio Pair M.D.   On: 06/25/2020 03:47   DG Chest 2 View  Result Date: 06/14/2020 CLINICAL DATA:  Suspect sepsis. EXAM: CHEST - 2 VIEW COMPARISON:  03/25/2019 FINDINGS: Heart size and vascularity normal. Mild bibasilar atelectasis. Small right effusion. Elevated left hemidiaphragm with bowel gas below the left diaphragm. Mild hyperinflation lungs. No mass lesion. No acute skeletal abnormality. IMPRESSION: Mild bibasilar atelectasis and small right effusion.   COPD. Electronically Signed   By: Franchot Gallo M.D.   On: 06/20/2020 13:35   CT Head Wo Contrast  Result Date: 05/29/2020 CLINICAL DATA:  Altered mental status. EXAM: CT HEAD WITHOUT CONTRAST TECHNIQUE: Contiguous axial images were obtained from the base of the skull through the vertex without  intravenous contrast. COMPARISON:  March 16, 2018 FINDINGS: Brain: There is mild cerebral atrophy with widening of the extra-axial spaces and ventricular dilatation. There are areas of decreased attenuation within the white matter tracts of the supratentorial brain, consistent with microvascular disease changes. Vascular: No hyperdense vessel or unexpected calcification. Skull: Normal. Negative for fracture or focal lesion. Sinuses/Orbits: Very mild, bilateral ethmoid sinus mucosal thickening is seen. Other: None. IMPRESSION: 1. Generalized cerebral atrophy. 2. Very mild, bilateral ethmoid sinus disease. 3. No acute intracranial abnormality. Electronically Signed   By: Virgina Norfolk M.D.   On: 06/10/2020 21:11   MR PELVIS WO CONTRAST  Result Date: 06/24/2020 CLINICAL DATA:  Possible rectal mass on CT EXAM: MRI PELVIS WITHOUT CONTRAST TECHNIQUE: Multiplanar multisequence MR imaging of the pelvis was performed. No intravenous contrast was administered. Small amount of [place] gel was administered per rectum to optimize tumor evaluation. COMPARISON:  CT abdomen/pelvis dated 06/10/2020 FINDINGS: Urinary Tract:  Bladder is within normal limits. Bowel: Distension of the rectum with ultrasound gel. No rectal mass is seen. Visualized bowel is otherwise unremarkable, including the sigmoid colon. Vascular/Lymphatic: No evidence of aneurysm. No suspicious pelvic lymphadenopathy. Reproductive:  Suspected postsurgical changes related to prior TURP. Other:  No pelvic ascites. Musculoskeletal: Mild degenerative changes of the lower lumbar spine. IMPRESSION: No rectosigmoid mass is evident on MR. Negative pelvic MRI. Electronically Signed   By: Julian Hy M.D.   On: 06/24/2020 12:15   MR ABDOMEN W WO CONTRAST  Result Date: 06/24/2020 CLINICAL DATA:  Inpatient. Frequent UTI. COPD and pulmonary fibrosis with chronic respiratory failure. Confusion. Concern for rectal mass on recent CT. EXAM: MRI ABDOMEN WITHOUT AND WITH  CONTRAST TECHNIQUE: Multiplanar multisequence MR imaging of the abdomen was performed both before and after the administration of intravenous contrast. CONTRAST:  7.37mL GADAVIST GADOBUTROL 1 MMOL/ML IV SOLN COMPARISON:  06/16/2020 CT abdomen/pelvis. FINDINGS: Lower chest: Small dependent bilateral pleural effusions, right greater than left. Hepatobiliary: Normal liver size and configuration. No hepatic steatosis. No liver mass. Cholecystectomy. Bile ducts are within normal post cholecystectomy limits. Common bile duct diameter  5 mm. No choledocholithiasis. No biliary masses, strictures or beading. Probable 12 mm diameter cystic duct remnant in the porta hepatis (series 4/image 14). Pancreas: No pancreatic mass or duct dilation.  No pancreas divisum. Spleen: Normal size. No mass. Adrenals/Urinary Tract: Normal adrenals. No hydronephrosis. Scattered simple subcentimeter renal cortical cysts in both kidneys. No suspicious renal masses. Stomach/Bowel: Normal non-distended stomach. Visualized small and large bowel is normal caliber, with no bowel wall thickening. Vascular/Lymphatic: Atherosclerotic nonaneurysmal abdominal aorta. Patent portal, splenic, hepatic and renal veins. Retroaortic left renal vein. No pathologically enlarged lymph nodes in the abdomen. Other: No abdominal ascites or focal fluid collection. Musculoskeletal: No aggressive appearing focal osseous lesions. Mild L4 vertebral compression fracture, chronic appearing. IMPRESSION: 1. No lymphadenopathy or other findings to suggest metastatic disease in the abdomen. MRI pelvis to be reported separately. 2. Bile ducts are within normal post cholecystectomy limits (CBD diameter 5 mm). No choledocholithiasis. Probable small cystic duct remnant in the porta hepatis. 3. Small dependent bilateral pleural effusions, right greater than left. 4. Mild L4 vertebral compression fracture, chronic appearing. Electronically Signed   By: Ilona Sorrel M.D.   On: 06/24/2020  07:01   CT ABDOMEN PELVIS W CONTRAST  Result Date: 06/09/2020 CLINICAL DATA:  Abdominal pain. EXAM: CT ABDOMEN AND PELVIS WITH CONTRAST TECHNIQUE: Multidetector CT imaging of the abdomen and pelvis was performed using the standard protocol following bolus administration of intravenous contrast. CONTRAST:  141mL OMNIPAQUE IOHEXOL 300 MG/ML  SOLN COMPARISON:  None. FINDINGS: Lower chest: A trace amount of atelectasis is seen within the right lung base. A very small right pleural effusion is noted. Hepatobiliary: No focal liver abnormality is seen. Status post cholecystectomy. No biliary dilatation. Pancreas: Unremarkable. No pancreatic ductal dilatation or surrounding inflammatory changes. Spleen: Normal in size without focal abnormality. Adrenals/Urinary Tract: Adrenal glands are unremarkable. Kidneys are normal, without renal calculi, focal lesion, or hydronephrosis. Bladder is unremarkable. Stomach/Bowel: Stomach is within normal limits. Appendix appears normal. No evidence of bowel dilatation. There is marked severity thickening of the rectum and adjacent portion of the distal sigmoid colon (axial CT images 71 through 75, CT series number 2). This is slightly asymmetric in appearance, right greater than left. No associated inflammatory fat stranding is seen. Vascular/Lymphatic: There is marked severity calcification and atherosclerosis of the abdominal aorta and bilateral common iliac arteries, without evidence of aneurysmal dilatation. No enlarged abdominal or pelvic lymph nodes. Reproductive: The prostate gland is mildly enlarged. Other: No abdominal wall hernia or abnormality. No abdominopelvic ascites. Musculoskeletal: Moderate to marked severity multilevel degenerative changes seen throughout the lumbar spine. IMPRESSION: 1. Asymmetric of the rectum and adjacent portion of the sigmoid colon, which may be secondary to an underlying mass. Correlation with physical examination and MRI is recommended. 2.  Evidence of prior cholecystectomy. 3. Very small right pleural effusion. 4. Mildly enlarged prostate gland. 5. Moderate to marked severity multilevel degenerative changes throughout the lumbar spine. 6. Aortic atherosclerosis. Aortic Atherosclerosis (ICD10-I70.0). Electronically Signed   By: Virgina Norfolk M.D.   On: 06/20/2020 21:22   DG Chest Port 1 View  Result Date: 07/03/2020 CLINICAL DATA:  Hypoxia. EXAM: PORTABLE CHEST 1 VIEW COMPARISON:  July 02, 2020. FINDINGS: The heart size and mediastinal contours are within normal limits. No pneumothorax or pleural effusion is noted. Mild bibasilar subsegmental atelectasis is noted. The visualized skeletal structures are unremarkable. IMPRESSION: Mild bibasilar subsegmental atelectasis. Electronically Signed   By: Marijo Conception M.D.   On: 07/03/2020 18:26   DG Chest  Port 1 View  Result Date: 07/02/2020 CLINICAL DATA:  Abnormal chest x-ray. EXAM: PORTABLE CHEST 1 VIEW COMPARISON:  06/27/2020 FINDINGS: Underlying COPD with hyperinflation. Superimposed bibasilar airspace disease demonstrates interval improvement from the prior study. No pleural effusion or pneumothorax. Heart size and vascularity normal. Atherosclerotic calcification aortic arch. IMPRESSION: Interval improvement in bibasilar atelectasis/infiltrate. Electronically Signed   By: Franchot Gallo M.D.   On: 07/02/2020 08:08   DG Chest Port 1 View  Result Date: 06/27/2020 CLINICAL DATA:  Altered mental status EXAM: PORTABLE CHEST 1 VIEW COMPARISON:  06/25/2020 FINDINGS: Chronic interstitial prominence. Improved aeration at the left lung base. No pleural effusion or pneumothorax. Stable cardiomediastinal contours. IMPRESSION: Improved aeration at the left lung base since 06/25/2020. No new findings. Electronically Signed   By: Macy Mis M.D.   On: 06/27/2020 08:07   DG Chest Port 1 View  Result Date: 06/24/2020 CLINICAL DATA:  Shortness of breath, pulmonary fibrosis, altered mental  status EXAM: PORTABLE CHEST 1 VIEW COMPARISON:  06/03/2020 FINDINGS: Mild cardiomegaly. Emphysema. Mild, diffuse interstitial opacity. Bibasilar fibrotic scarring. The visualized skeletal structures are unremarkable. IMPRESSION: Emphysema and bibasilar fibrotic change with mild, diffuse interstitial opacity, potentially edema. No focal airspace opacity. Electronically Signed   By: Eddie Candle M.D.   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    Patient/Family are satisfied with care plan and all questions have been answered.  This document was prepared using Dragon voice recognition software and may include unintentional dictation errors.     Ottie Glazier, M.D.  Division of [place]

## 2020-07-04 NOTE — Progress Notes (Addendum)
Progress Note    Eric Li  PPI:[id number] DOB: 1935/07/02  DOA: 06/17/2020 PCP: [name], MD      Brief Narrative:    Medical records reviewed and are as summarized below:  Eric Li is a 85 y.o. male  with history of paroxysmal A. fib on apixaban, HTN, CAD, frequent UTIs, COPD and pulmonary fibrosis with chronic respiratory failure on home O2 at 3 L.  He was brought to the hospital because of altered mental status.  He was found to have acute UTI and altered mental status.  To be due to acute metabolic encephalopathy.  He was treated with IV ceftriaxone.  He developed acute on chronic hypoxemic respiratory failure secondary to exacerbation of pulmonary fibrosis.  He was treated with oxygen via heated high flow nasal cannula.      Assessment/Plan:   Principal Problem:   Acute metabolic encephalopathy Active Problems:   CAD (coronary artery disease)   HTN (hypertension)   UTI (urinary tract infection)   PAF (paroxysmal atrial fibrillation) (HCC)   Pulmonary fibrosis (HCC)   Chronic respiratory failure with hypoxia (HCC)   COPD, severe (HCC)   Abnormal computed tomography angiography (CTA) of abdomen and pelvis   Goals of care, counseling/discussion   Palliative care by specialist   DNR (do not resuscitate) Underlying cognitive impairment/ ?  Dementia  Nutrition Problem: Inadequate oral intake Etiology: acute illness  Signs/Symptoms: meal completion < 25%   Body mass index is 17.92 kg/m.    PLAN  Patient was weaned down to 4 L/min oxygen via nasal cannula but oxygen therapy has been escalated again to oxygenation via heated high flow nasal cannula (40% at 45 L/min).  Continue prednisone for pulmonary fibrosis  Continue bronchodilators for COPD  Continue Cardizem, metoprolol and Eliquis for paroxysmal atrial fibrillation  Humalog as needed for hyperglycemia.  Leukocytosis probably due to steroids.  Monitor CBC.  Supportive care  for encephalopathy/cognitive impairment  Her daughter thinks that patient will not do well at home and is contemplating discharge to SNF.    Diet Order            DIET - DYS 1 Room service appropriate? Yes with Assist; Fluid consistency: Thin  Diet effective now                    Consultants:  Pulmonologist  Procedures:  None    Medications:   . acidophilus  1 capsule Oral Daily  . apixaban  2.5 mg Oral BID  . diltiazem  360 mg Oral Daily  . escitalopram  10 mg Oral Daily  . feeding supplement  237 mL Oral BID BM  . fluticasone  2 spray Each Nare Daily  . furosemide  20 mg Intravenous BID  . insulin aspart  0-9 Units Subcutaneous TID WC  . magic mouthwash  15 mL Oral TID  . metoprolol succinate  25 mg Oral Daily  . mometasone-formoterol  2 puff Inhalation BID  . multivitamin with minerals  1 tablet Oral Daily  . nystatin ointment   Topical BID  . pantoprazole  40 mg Oral Daily  . polyethylene glycol  34 g Oral BID  . potassium chloride  20 mEq Oral Daily  . predniSONE  20 mg Oral Q breakfast   Continuous Infusions:   Anti-infectives (From admission, onward)   Start     Dose/Rate Route Frequency Ordered Stop   06/26/20 2200  cefTRIAXone (ROCEPHIN) 1 g in sodium chloride  0.9 % 100 mL IVPB        1 g 200 mL/hr over 30 Minutes Intravenous Every 24 hours 06/26/20 1137 06/26/20 2237   06/22/20 2200  cefTRIAXone (ROCEPHIN) 1 g in sodium chloride 0.9 % 100 mL IVPB  Status:  Discontinued        1 g 200 mL/hr over 30 Minutes Intravenous Every 24 hours 05/27/2020 2248 06/26/20 1137   06/09/2020 2145  cefTRIAXone (ROCEPHIN) 2 g in sodium chloride 0.9 % 100 mL IVPB        2 g 200 mL/hr over 30 Minutes Intravenous  Once 05/28/2020 2142 05/24/2020 2249             Family Communication/Anticipated D/C date and plan/Code Status   DVT prophylaxis: apixaban (ELIQUIS) tablet 2.5 mg Start: 05/30/2020 2245 apixaban (ELIQUIS) tablet 2.5 mg     Code Status: DNR  Family  Communication: Plan discussed with her daughter, Jovita Gamma at the bedside Disposition Plan:    Status is: Inpatient  Remains inpatient appropriate because:Unsafe d/c plan and Severe hypoxia requiring high flow nasal cannula   Dispo:  Patient From: Home  Planned Disposition: [facility]  Expected discharge date: 07/05/20  Medically stable for discharge: No            Subjective:   Overnight events noted.  No chest pain or shortness of breath but he is requiring oxygen via high flow nasal cannula.  Lorri, her daughter, is at the bedside.  Objective:    Vitals:   07/04/20 0327 07/04/20 0734 07/04/20 1201 07/04/20 1632  BP: 103/70 119/66  95/68  Pulse: (!) 54 76  (!) 109  Resp:  20  20  Temp: 98 F (36.7 C) 97.9 F (36.6 C)  98.3 F (36.8 C)  TempSrc: Oral Oral  Oral  SpO2: 93% 92% 96% 90%  Weight: 50.3 kg     Height:       No data found.   Intake/Output Summary (Last 24 hours) at 07/04/2020 1644 Last data filed at 07/04/2020 1417 Gross per 24 hour  Intake 240 ml  Output 400 ml  Net -160 ml   Filed Weights   07/02/20 0406 07/03/20 0408 07/04/20 0327  Weight: 49.7 kg 50 kg 50.3 kg    Exam:  GEN: NAD SKIN: Warm and dry EYES: EOMI ENT: MMM CV: RRR, he is on 40% FiO2 via high flow nasal cannula. PULM: CTA B ABD: soft, ND, NT, +BS CNS: AAO x 3, non focal EXT: No edema or tenderness   Data Reviewed:   I have personally reviewed following labs and imaging studies:  Labs: Labs show the following:   Basic Metabolic Panel: Recent Labs  Lab 06/29/20 0455 06/29/20 0455 06/30/20 0432 06/30/20 0432 07/01/20 0536 07/01/20 0536 07/02/20 0508 07/03/20 0415  NA 134*  --  132*  --  137  --  138 136  K 3.6   < > 3.5   < > 3.3*   < > 4.0 4.5  CL 96*  --  94*  --  97*  --  99 97*  CO2 27  --  29  --  28  --  31 27  GLUCOSE 119*  --  181*  --  117*  --  114* 221*  BUN 31*  --  22  --  27*  --  37* 29*  CREATININE 0.72  --  0.79  --  0.75   --  0.88 0.86  CALCIUM 8.4*  --  8.3*  --  8.7*  --  8.9 8.9  MG  --   --   --   --  2.4  --  2.4 2.3   < > = values in this interval not displayed.   GFR Estimated Creatinine Clearance: 46.3 mL/min (by C-G formula based on SCr of 0.86 mg/dL). Liver Function Tests: No results for input(s): AST, ALT, ALKPHOS, BILITOT, PROT, ALBUMIN in the last 168 hours. No results for input(s): LIPASE, AMYLASE in the last 168 hours. No results for input(s): AMMONIA in the last 168 hours. Coagulation profile No results for input(s): INR, PROTIME in the last 168 hours.  CBC: Recent Labs  Lab 06/28/20 0609 07/01/20 0536 07/02/20 0508 07/03/20 0415  WBC 16.8* 22.1* 20.0* 17.3*  HGB 14.7 15.3 15.4 14.9  HCT 44.9 46.2 46.2 46.1  MCV 90.7 89.4 89.0 92.6  PLT 241 257 266 245   Cardiac Enzymes: No results for input(s): CKTOTAL, CKMB, CKMBINDEX, TROPONINI in the last 168 hours. BNP (last 3 results) No results for input(s): PROBNP in the last 8760 hours. CBG: Recent Labs  Lab 07/03/20 1624 07/03/20 2029 07/04/20 0735 07/04/20 1132 07/04/20 1632  GLUCAP 216* 179* 114* 102* 191*   D-Dimer: No results for input(s): DDIMER in the last 72 hours. Hgb A1c: No results for input(s): HGBA1C in the last 72 hours. Lipid Profile: No results for input(s): CHOL, HDL, LDLCALC, TRIG, CHOLHDL, LDLDIRECT in the last 72 hours. Thyroid function studies: No results for input(s): TSH, T4TOTAL, T3FREE, THYROIDAB in the last 72 hours.  Invalid input(s): FREET3 Anemia work up: No results for input(s): VITAMINB12, FOLATE, FERRITIN, TIBC, IRON, RETICCTPCT in the last 72 hours. Sepsis Labs: Recent Labs  Lab 06/28/20 0609 07/01/20 0536 07/02/20 0508 07/03/20 0415  WBC 16.8* 22.1* 20.0* 17.3*    Microbiology No results found for this or any previous visit (from the past 240 hour(s)).  Procedures and diagnostic studies:  DG Chest Port 1 View  Result Date: 07/03/2020 CLINICAL DATA:  Hypoxia. EXAM:  PORTABLE CHEST 1 VIEW COMPARISON:  July 02, 2020. FINDINGS: The heart size and mediastinal contours are within normal limits. No pneumothorax or pleural effusion is noted. Mild bibasilar subsegmental atelectasis is noted. The visualized skeletal structures are unremarkable. IMPRESSION: Mild bibasilar subsegmental atelectasis. Electronically Signed   By: Marijo Conception M.D.   On: 07/03/2020 18:26               LOS: 13 days   [facility]   Pager on [email or web address]. If 7PM-7AM, please contact night-coverage at [email or web address]     07/04/2020, 4:44 PM

## 2020-07-04 NOTE — Progress Notes (Signed)
Physical Therapy Treatment Patient Details Name: Eric Li MRN: [id number] DOB: 1934/11/17 Today's Date: 07/04/2020    History of Present Illness Eric Li is an 85 y/o male who was admitted with chief complaint of abdominal pain and confusion. Pt found to have a UTI. PMH includes paroxysmal A-fib on Apixaban, HTN, HLD, CAD, CHF, COPD, pulmonary fibrosis with chronic respiratory failure, on home O2 at 3L, bilateral hearing loss, nephrolitiasis, SOB on exertion, and statin intolerance.    PT Comments    Pt presents in bed on arrival to room and is agreeable to PT session. He is modA+1 for supine<>sit as he needed assistance with trunk to sit EOB and on the return his LEs. VCs needed for sequencing throughout bed mobility. He performed sitting exercises EOB with MinA to [place] for balance as his dizziness subsided. He was leaning towards his L but able to adjust himself with VCs, After a couple of minutes he was demonstrating L lateral lean and became fatigued so brought him back to bed. He was able to shift his hips over in bed with VCs cues but was MaxA x2 to scoot higher in bed c draw sheet. Pt continues to benefit from skilled PT services to maximize return to PLOF and minimize risk of future falls, injury, caregiver burden, and readmission. Will continue to follow POC. Discharge recommendation remains appropriate.   Follow Up Recommendations  SNF;Supervision for mobility/OOB     Equipment Recommendations  Rolling walker with 5" wheels;Other (comment) (unclear if pt has one)    Recommendations for Other Services       Precautions / Restrictions Precautions Precautions: Fall Restrictions Weight Bearing Restrictions: No    Mobility  Bed Mobility Overal bed mobility: Needs Assistance Bed Mobility: Sit to Supine;Supine to Sit     Supine to sit: Mod assist Sit to supine: Mod assist;+2 for physical assistance   General bed mobility comments: For supine to sit patient needed  assistance with trunk. However, on the return patient needed asssitance with LE. VCs needed for sequencing throughout bed mobility. MAX A x2 to scoot higher in bed c draw sheet. He was able to shift his hips over in bed with VCs cues  Transfers                 General transfer comment: Did not perform due to patient declining  Ambulation/Gait             General Gait Details: Did not assess due to safety   Stairs             Wheelchair Mobility    Modified Rankin (Stroke Patients Only)       Balance Overall balance assessment: Needs assistance Sitting-balance support: Feet supported;Bilateral upper extremity supported Sitting balance-Leahy Scale: Fair Sitting balance - Comments: MinA to [place] sitting balance as patient's dizziness subsided. He was leaning towards his L but able to adjust himself with VCs Postural control: Left lateral lean     Standing balance comment: Did not assess due to safety and patient declining                            Cognition Arousal/Alertness: Awake/alert Behavior During Therapy: WFL for tasks assessed/performed Overall Cognitive Status: History of cognitive impairments - at baseline  Exercises General Exercises - Lower Extremity Long Arc Quad: AROM;Both;10 reps;Seated Hip Flexion/Marching: AROM;Both;10 reps;Seated Other Exercises Other Exercises: Scapular retractions x 5 sitting EOB    General Comments        Pertinent Vitals/Pain Pain Assessment: No/denies pain    Home Living                      Prior Function            PT Goals (current goals can now be found in the care plan section) Acute Rehab PT Goals Patient Stated Goal: to go home - not to go to a facility unless absolutely necessary PT Goal Formulation: With family Time For Goal Achievement: 07/11/20 Potential to Achieve Goals: Fair Progress towards PT goals: Progressing  toward goals    Frequency    Min 2X/week      PT Plan Current plan remains appropriate    Co-evaluation              AM-PAC PT "6 Clicks" Mobility   Outcome Measure  Help needed turning from your back to your side while in a flat bed without using bedrails?: A Lot Help needed moving from lying on your back to sitting on the side of a flat bed without using bedrails?: A Lot Help needed moving to and from a bed to a chair (including a wheelchair)?: A Lot Help needed standing up from a chair using your arms (e.g., wheelchair or bedside chair)?: A Lot Help needed to walk in hospital room?: A Lot Help needed climbing 3-5 steps with a railing? : A Lot 6 Click Score: 12    End of Session Equipment Utilized During Treatment: Oxygen Activity Tolerance: Patient limited by fatigue;Patient tolerated treatment well Patient left: in bed;with call bell/phone within reach;with bed alarm set;with family/visitor present Nurse Communication: Mobility status PT Visit Diagnosis: Unsteadiness on feet (R26.81);Other abnormalities of gait and mobility (R26.89);Repeated falls (R29.6);Muscle weakness (generalized) (M62.81);History of falling (Z91.81)     Time: 7001-7494 PT Time Calculation (min) (ACUTE ONLY): 13 min  Charges:                         Noemi Chapel, SPT Bernita Raisin 07/04/2020, 12:55 PM

## 2020-07-05 DIAGNOSIS — J9621 Acute and chronic respiratory failure with hypoxia: Secondary | ICD-10-CM

## 2020-07-05 DIAGNOSIS — I480 Paroxysmal atrial fibrillation: Secondary | ICD-10-CM | POA: Diagnosis not present

## 2020-07-05 DIAGNOSIS — J8410 Pulmonary fibrosis, unspecified: Secondary | ICD-10-CM | POA: Diagnosis not present

## 2020-07-05 DIAGNOSIS — G9341 Metabolic encephalopathy: Secondary | ICD-10-CM | POA: Diagnosis not present

## 2020-07-05 LAB — GLUCOSE, CAPILLARY
Glucose-Capillary: 101 mg/dL — ABNORMAL HIGH (ref 70–99)
Glucose-Capillary: 168 mg/dL — ABNORMAL HIGH (ref 70–99)
Glucose-Capillary: 191 mg/dL — ABNORMAL HIGH (ref 70–99)
Glucose-Capillary: 204 mg/dL — ABNORMAL HIGH (ref 70–99)

## 2020-07-05 LAB — BASIC METABOLIC PANEL
Anion gap: 11 (ref 5–15)
BUN: 30 mg/dL — ABNORMAL HIGH (ref 8–23)
CO2: 30 mmol/L (ref 22–32)
Calcium: 8.7 mg/dL — ABNORMAL LOW (ref 8.9–10.3)
Chloride: 94 mmol/L — ABNORMAL LOW (ref 98–111)
Creatinine, Ser: 0.99 mg/dL (ref 0.61–1.24)
GFR, Estimated: >60 mL/min (ref >60)
Glucose, Bld: 130 mg/dL — ABNORMAL HIGH (ref 70–99)
Potassium: 3.7 mmol/L (ref 3.5–5.1)
Sodium: 135 mmol/L (ref 135–145)

## 2020-07-05 NOTE — Progress Notes (Signed)
Pulmonary Medicine          Date: 07/05/2020,   MRN# [id number] Eric Li 11-08-1934     AdmissionWeight: 53.5 kg                 CurrentWeight: 50.3 kg   Referring physician: Dr Roger Shelter   CHIEF COMPLAINT:   Acute hypoxemic respiratory failure   HISTORY OF PRESENT ILLNESS   As per admission h/p Eric Li is a 85 y.o. male with medical history significant for paroxysmal A. fib on apixaban, HTN, CAD, COPD and pulmonary fibrosis with chronic respiratory failure on home O2 at 3 L, who was brought to the emergency room by EMS because of confusion. Family called with concerns for patient waking up disoriented believing he was somewhere else and appeared to be not acting himself and " talking out of his head". Wife also voiced concern for possible UTI as his urination was frequent and with strong odor. He has had no fever or chills, or chest pain or shortness of breath. Has had no nausea, vomiting or change in bowel habits. He is fully vaccinated against Covid. On arrival, his vitals were within normal limits without fever or tachycardia and with O2 sat 97% on O2 at home flow rate . Blood work was significant for WBC of 11,700 and lactic acid of 2>>1.6. Urinalysis showed pyuria. Other blood work mostly unremarkable.  EKG  NSR with RBBB with nonspecific ST-T wave changes  Chest x-ray showed no acute findings, head CT no acute findings. Due to patient complaint of abdominal pain CT abdomen and pelvis with contrast was done which showed asymmetry of the rectum and adjacent portion of the sigmoid colon. Pulmonary consultation for worsening dyspnea with acute on chronic hypoxemic respiratory failure.   06/25/20- patient evaluated at bedside this afternoon.  He had repeat CXR overnight with worsening LLL infiltrate.  Family member at bedside (daughter) shares that he had desatutation event after eating. This suggests aspiration. He made adequate urine with lasix with notable  improvement after diuresis as per family.  MetNEB was unable to be used due to patient being sleepy unable to participate.    06/26/20-  Patient with mild imrpovement.  Noted Palliative evaluation appreciate input. Will institute OT/PT starting tommorow.  Contiue gentle diuresis and MetaNEB therapy as well as current antimicrobials.   06/27/20- Patient seen and examined this morning he seems confused.  Granddaughter at bedside during my examination states he has been having similar symptoms for past 1/2 year and thoughts were revolving around dementia workup. This seems to be episode of sundowning. Patient was significantly more oriented and lucid yesterday mid-day.  He had CXR today with good improvement of previous infiltrates.   06/28/20- patient is down to 45% 45L/min in HFNC which is significantly better then previous. He is resting in bed comfortably. Family at bedside during my evaluation.   06/29/20- patient resting in bed similar setting on HFNC. No changes in recommendations today.   07/02/20-  Net negative 8L,  Repeat CXR included below- with improvement, patient is weaned down to 30/30 on HFNC will transition to regular nasal canula today if RT is available. He was able to get OOB with RT to edge of bed, had BORG 3-4 dyspnea.   Will decrease lasix today due to euvolemic status with new onset pre renal azotemia. Overall significantly improved. Will plan for continued chest physiotherapy with MetaNEB.   07/03/20- Patient resting in bed. Family at bedside. He  is on home setting of 4L/min [place].  He is participating with PT.  Overall respiratory status is much improved. Weaning steroids prednisone taper.   07/04/20- patient had episode of respiratory distress and had to re-initiate HFNC.  He is weaned to 79/40 but saturates in 85-90%.  During my interview and examination he drank water and almost aspirated even with assistance which is concerning for aspiration. Daughter explains since he cannot wear  dentures he is unable to eat well and occasionally notes chocking on liquids and states since he is so weak aspiration has become more problematic.  Will repeat CXR today.   07/05/20- patient resting in bed, he is lucid and in good spirits, was able to shake my hand and speak with me but slow to answer. High flow nasal canula is weaned further to 35/35.  Plan to continue with PT and chest PT utilizing metaneb with RT service.    PAST MEDICAL HISTORY   Past Medical History:  Diagnosis Date  . Bilateral hearing loss 03/03/2016  . CAD (coronary artery disease) 02/24/2014  . CHF (congestive heart failure) ([place])   . COPD, moderate ([place]) 08/29/2015  . Hemorrhoids 02/24/2014  . HTN (hypertension) 02/24/2014  . Hyperlipidemia, unspecified 02/24/2014  . Nephrolithiasis   . SOB (shortness of breath) on exertion 03/27/2015  . Statin intolerance 03/30/2015     SURGICAL HISTORY   Past Surgical History:  Procedure Laterality Date  . CHOLECYSTECTOMY    . TONSILLECTOMY       FAMILY HISTORY   Family History  Problem Relation Age of Onset  . Bladder Cancer Neg Hx   . Prolactinoma Neg Hx   . Prostate cancer Neg Hx   . Kidney cancer Neg Hx      SOCIAL HISTORY   Social History   Tobacco Use  . Smoking status: Former Research scientist (life sciences)  . Smokeless tobacco: Never Used  Vaping Use  . Vaping Use: Never used  Substance Use Topics  . Alcohol use: No  . Drug use: No     MEDICATIONS    Home Medication:    Current Medication:  Current Facility-Administered Medications:  .  acetaminophen (TYLENOL) tablet 650 mg, 650 mg, Oral, Q6H PRN, 650 mg at 07/04/20 1509 **OR** acetaminophen (TYLENOL) suppository 650 mg, 650 mg, Rectal, Q6H PRN, Athena Masse, MD .  acidophilus (RISAQUAD) capsule 1 capsule, 1 capsule, Oral, Daily, Shahmehdi, Seyed A, MD, 1 capsule at 07/04/20 0855 .  apixaban (ELIQUIS) tablet 2.5 mg, 2.5 mg, Oral, BID, Judd Gaudier V, MD, 2.5 mg at 07/04/20 2143 .  diclofenac Sodium (VOLTAREN) 1 %  topical gel 2 g, 2 g, Topical, BID PRN, Shahmehdi, Seyed A, MD .  diltiazem (CARDIZEM CD) 24 hr capsule 360 mg, 360 mg, Oral, Daily, Lorella Nimrod, MD, 360 mg at 07/04/20 0855 .  escitalopram (LEXAPRO) tablet 10 mg, 10 mg, Oral, Daily, Judd Gaudier V, MD, 10 mg at 07/04/20 0855 .  feeding supplement (ENSURE ENLIVE) (ENSURE ENLIVE) liquid 237 mL, 237 mL, Oral, BID BM, Lorella Nimrod, MD, 237 mL at 07/04/20 0857 .  fluticasone (FLONASE) 50 MCG/ACT nasal spray 2 spray, 2 spray, Each Nare, Daily, Athena Masse, MD, 2 spray at 07/04/20 0857 .  furosemide (LASIX) injection 20 mg, 20 mg, Intravenous, BID, Enzo Bi, MD, 20 mg at 07/05/20 0609 .  insulin aspart (novoLOG) injection 0-9 Units, 0-9 Units, Subcutaneous, TID WC, Shahmehdi, Seyed A, MD, 2 Units at 07/04/20 1718 .  levalbuterol (XOPENEX) nebulizer solution 0.63 mg, 0.63 mg,  Nebulization, Q6H PRN, Lang Snow, NP, 0.63 mg at 06/27/20 0845 .  magic mouthwash, 15 mL, Oral, TID, Lorella Nimrod, MD, 15 mL at 07/04/20 2143 .  metoprolol succinate (TOPROL-XL) 24 hr tablet 25 mg, 25 mg, Oral, Daily, Lashan Macias, MD, 25 mg at 07/04/20 0855 .  mometasone-formoterol (DULERA) 200-5 MCG/ACT inhaler 2 puff, 2 puff, Inhalation, BID, Athena Masse, MD, 2 puff at 07/04/20 2142 .  multivitamin with minerals tablet 1 tablet, 1 tablet, Oral, Daily, Judd Gaudier V, MD, 1 tablet at 07/04/20 0856 .  nitroGLYCERIN (NITROSTAT) SL tablet 0.4 mg, 0.4 mg, Sublingual, Q5 min PRN, Athena Masse, MD .  nystatin ointment (MYCOSTATIN), , Topical, BID, Enzo Bi, MD, Given at 07/04/20 2143 .  ondansetron (ZOFRAN) tablet 4 mg, 4 mg, Oral, Q6H PRN **OR** ondansetron (ZOFRAN) injection 4 mg, 4 mg, Intravenous, Q6H PRN, Athena Masse, MD .  pantoprazole (PROTONIX) EC tablet 40 mg, 40 mg, Oral, Daily, Judd Gaudier V, MD, 40 mg at 07/04/20 0855 .  polyethylene glycol (MIRALAX / GLYCOLAX) packet 34 g, 34 g, Oral, BID, Enzo Bi, MD, 34 g at 07/04/20 0856 .   potassium chloride 20 MEQ/15ML (10%) solution 20 mEq, 20 mEq, Oral, Daily, Oswald Hillock, RPH, 20 mEq at 07/04/20 0858 .  predniSONE (DELTASONE) tablet 20 mg, 20 mg, Oral, Q breakfast, Kentavius Dettore, MD, 20 mg at 07/04/20 0855 .  psyllium (HYDROCIL/METAMUCIL) 1 packet, 1 packet, Oral, Daily PRN, Athena Masse, MD, 1 packet at 06/29/20 1028 .  traMADol (ULTRAM) tablet 50 mg, 50 mg, Oral, Q6H PRN, Shahmehdi, Seyed A, MD, 50 mg at 07/03/20 2116 .  traZODone (DESYREL) tablet 50 mg, 50 mg, Oral, QHS PRN, Shahmehdi, Seyed A, MD, 50 mg at 07/03/20 2116    ALLERGIES   Simvastatin     REVIEW OF SYSTEMS    Review of Systems:  Gen:  Denies  fever, sweats, chills weigh loss  HEENT: Denies blurred vision, double vision, ear pain, eye pain, hearing loss, nose bleeds, sore throat Cardiac:  No dizziness, chest pain or heaviness, chest tightness,edema Resp:   Denies cough or sputum porduction, shortness of breath,wheezing, hemoptysis,  Gi: Denies swallowing difficulty, stomach pain, nausea or vomiting, diarrhea, constipation, bowel incontinence Gu:  Denies bladder incontinence, burning urine Ext:   Denies Joint pain, stiffness or swelling Skin: Denies  skin rash, easy bruising or bleeding or hives Endoc:  Denies polyuria, polydipsia , polyphagia or weight change Psych:   Denies depression, insomnia or hallucinations   Other:  All other systems negative   VS: BP 114/70 (BP Location: Left Arm)   Pulse (!) 57   Temp 97.8 F (36.6 C) (Oral)   Resp (!) 21   Ht 5\' 6"  (1.676 m)   Wt 50.3 kg   SpO2 91%   BMI 17.90 kg/m      PHYSICAL EXAM    GENERAL:NAD, no fevers, chills, no weakness no fatigue HEAD: Normocephalic, atraumatic.  EYES: Pupils equal, round, reactive to light. Extraocular muscles intact. No scleral icterus.  MOUTH: Moist mucosal membrane. Dentition intact. No abscess noted.  EAR, NOSE, THROAT: Clear without exudates. No external lesions.  NECK: Supple. No thyromegaly.  No nodules. No JVD.  PULMONARY: mild ronchi bilaterally  CARDIOVASCULAR: S1 and S2. Regular rate and rhythm. No murmurs, rubs, or gallops. No edema. Pedal pulses 2+ bilaterally.  GASTROINTESTINAL: Soft, nontender, nondistended. No masses. Positive bowel sounds. No hepatosplenomegaly.  MUSCULOSKELETAL: No swelling, clubbing, or edema. Range of motion full in  all extremities.  NEUROLOGIC: Cranial nerves II through XII are intact. No gross focal neurological deficits. Sensation intact. Reflexes intact.  SKIN: No ulceration, lesions, rashes, or cyanosis. Skin warm and dry. Turgor intact.  PSYCHIATRIC: Mood, affect within normal limits. The patient is awake, alert and oriented x 3. Insight, judgment intact.       IMAGING    DG Chest 1 View  Result Date: 06/25/2020 CLINICAL DATA:  Shortness of breath EXAM: CHEST  1 VIEW COMPARISON:  June 24, 2020 FINDINGS: The heart size and mediastinal contours are unchanged with mild cardiomegaly. Aortic knob calcifications are seen. Again noted is hyperinflation of the lung zones with flattening of the hemidiaphragms. There is mildly increased interstitial opacities seen at both lower lungs. There is new hazy airspace opacity seen at the left lung base. No acute osseous abnormality. IMPRESSION: Increased hazy airspace opacity at the left lung base which could be due to atelectasis and/or infectious etiology. Probable chronic interstitial opacities at both lungs. Electronically Signed   By: Prudencio Pair M.D.   On: 06/25/2020 03:47   DG Chest 2 View  Result Date: 06/09/2020 CLINICAL DATA:  Suspect sepsis. EXAM: CHEST - 2 VIEW COMPARISON:  03/25/2019 FINDINGS: Heart size and vascularity normal. Mild bibasilar atelectasis. Small right effusion. Elevated left hemidiaphragm with bowel gas below the left diaphragm. Mild hyperinflation lungs. No mass lesion. No acute skeletal abnormality. IMPRESSION: Mild bibasilar atelectasis and small right effusion.   COPD.  Electronically Signed   By: Franchot Gallo M.D.   On: 06/02/2020 13:35   CT Head Wo Contrast  Result Date: 06/10/2020 CLINICAL DATA:  Altered mental status. EXAM: CT HEAD WITHOUT CONTRAST TECHNIQUE: Contiguous axial images were obtained from the base of the skull through the vertex without intravenous contrast. COMPARISON:  March 16, 2018 FINDINGS: Brain: There is mild cerebral atrophy with widening of the extra-axial spaces and ventricular dilatation. There are areas of decreased attenuation within the white matter tracts of the supratentorial brain, consistent with microvascular disease changes. Vascular: No hyperdense vessel or unexpected calcification. Skull: Normal. Negative for fracture or focal lesion. Sinuses/Orbits: Very mild, bilateral ethmoid sinus mucosal thickening is seen. Other: None. IMPRESSION: 1. Generalized cerebral atrophy. 2. Very mild, bilateral ethmoid sinus disease. 3. No acute intracranial abnormality. Electronically Signed   By: Virgina Norfolk M.D.   On: 06/03/2020 21:11   MR PELVIS WO CONTRAST  Result Date: 06/24/2020 CLINICAL DATA:  Possible rectal mass on CT EXAM: MRI PELVIS WITHOUT CONTRAST TECHNIQUE: Multiplanar multisequence MR imaging of the pelvis was performed. No intravenous contrast was administered. Small amount of [place] gel was administered per rectum to optimize tumor evaluation. COMPARISON:  CT abdomen/pelvis dated 06/02/2020 FINDINGS: Urinary Tract:  Bladder is within normal limits. Bowel: Distension of the rectum with ultrasound gel. No rectal mass is seen. Visualized bowel is otherwise unremarkable, including the sigmoid colon. Vascular/Lymphatic: No evidence of aneurysm. No suspicious pelvic lymphadenopathy. Reproductive:  Suspected postsurgical changes related to prior TURP. Other:  No pelvic ascites. Musculoskeletal: Mild degenerative changes of the lower lumbar spine. IMPRESSION: No rectosigmoid mass is evident on MR. Negative pelvic MRI. Electronically Signed    By: Julian Hy M.D.   On: 06/24/2020 12:15   MR ABDOMEN W WO CONTRAST  Result Date: 06/24/2020 CLINICAL DATA:  Inpatient. Frequent UTI. COPD and pulmonary fibrosis with chronic respiratory failure. Confusion. Concern for rectal mass on recent CT. EXAM: MRI ABDOMEN WITHOUT AND WITH CONTRAST TECHNIQUE: Multiplanar multisequence MR imaging of the abdomen was performed both  before and after the administration of intravenous contrast. CONTRAST:  7.54mL GADAVIST GADOBUTROL 1 MMOL/ML IV SOLN COMPARISON:  05/27/2020 CT abdomen/pelvis. FINDINGS: Lower chest: Small dependent bilateral pleural effusions, right greater than left. Hepatobiliary: Normal liver size and configuration. No hepatic steatosis. No liver mass. Cholecystectomy. Bile ducts are within normal post cholecystectomy limits. Common bile duct diameter 5 mm. No choledocholithiasis. No biliary masses, strictures or beading. Probable 12 mm diameter cystic duct remnant in the porta hepatis (series 4/image 14). Pancreas: No pancreatic mass or duct dilation.  No pancreas divisum. Spleen: Normal size. No mass. Adrenals/Urinary Tract: Normal adrenals. No hydronephrosis. Scattered simple subcentimeter renal cortical cysts in both kidneys. No suspicious renal masses. Stomach/Bowel: Normal non-distended stomach. Visualized small and large bowel is normal caliber, with no bowel wall thickening. Vascular/Lymphatic: Atherosclerotic nonaneurysmal abdominal aorta. Patent portal, splenic, hepatic and renal veins. Retroaortic left renal vein. No pathologically enlarged lymph nodes in the abdomen. Other: No abdominal ascites or focal fluid collection. Musculoskeletal: No aggressive appearing focal osseous lesions. Mild L4 vertebral compression fracture, chronic appearing. IMPRESSION: 1. No lymphadenopathy or other findings to suggest metastatic disease in the abdomen. MRI pelvis to be reported separately. 2. Bile ducts are within normal post cholecystectomy limits (CBD  diameter 5 mm). No choledocholithiasis. Probable small cystic duct remnant in the porta hepatis. 3. Small dependent bilateral pleural effusions, right greater than left. 4. Mild L4 vertebral compression fracture, chronic appearing. Electronically Signed   By: Ilona Sorrel M.D.   On: 06/24/2020 07:01   CT ABDOMEN PELVIS W CONTRAST  Result Date: 05/28/2020 CLINICAL DATA:  Abdominal pain. EXAM: CT ABDOMEN AND PELVIS WITH CONTRAST TECHNIQUE: Multidetector CT imaging of the abdomen and pelvis was performed using the standard protocol following bolus administration of intravenous contrast. CONTRAST:  150mL OMNIPAQUE IOHEXOL 300 MG/ML  SOLN COMPARISON:  None. FINDINGS: Lower chest: A trace amount of atelectasis is seen within the right lung base. A very small right pleural effusion is noted. Hepatobiliary: No focal liver abnormality is seen. Status post cholecystectomy. No biliary dilatation. Pancreas: Unremarkable. No pancreatic ductal dilatation or surrounding inflammatory changes. Spleen: Normal in size without focal abnormality. Adrenals/Urinary Tract: Adrenal glands are unremarkable. Kidneys are normal, without renal calculi, focal lesion, or hydronephrosis. Bladder is unremarkable. Stomach/Bowel: Stomach is within normal limits. Appendix appears normal. No evidence of bowel dilatation. There is marked severity thickening of the rectum and adjacent portion of the distal sigmoid colon (axial CT images 71 through 75, CT series number 2). This is slightly asymmetric in appearance, right greater than left. No associated inflammatory fat stranding is seen. Vascular/Lymphatic: There is marked severity calcification and atherosclerosis of the abdominal aorta and bilateral common iliac arteries, without evidence of aneurysmal dilatation. No enlarged abdominal or pelvic lymph nodes. Reproductive: The prostate gland is mildly enlarged. Other: No abdominal wall hernia or abnormality. No abdominopelvic ascites.  Musculoskeletal: Moderate to marked severity multilevel degenerative changes seen throughout the lumbar spine. IMPRESSION: 1. Asymmetric of the rectum and adjacent portion of the sigmoid colon, which may be secondary to an underlying mass. Correlation with physical examination and MRI is recommended. 2. Evidence of prior cholecystectomy. 3. Very small right pleural effusion. 4. Mildly enlarged prostate gland. 5. Moderate to marked severity multilevel degenerative changes throughout the lumbar spine. 6. Aortic atherosclerosis. Aortic Atherosclerosis (ICD10-I70.0). Electronically Signed   By: Virgina Norfolk M.D.   On: 05/27/2020 21:22   DG Chest Port 1 View  Result Date: 07/04/2020 CLINICAL DATA:  85 year old male with recent hypoxia. Shortness  of breath. EXAM: PORTABLE CHEST 1 VIEW COMPARISON:  Portable chest 07/03/2020 and earlier. FINDINGS: Portable AP upright view at 1634 hours. Diffuse emphysema demonstrated on chest CT 03/19/2020. Associated mild bilateral basilar predominant increased interstitial markings appear stable across this series of exams. Stable lung volumes and mediastinal contours. No pneumothorax, pulmonary edema, pleural effusion or acute pulmonary opacity. No acute osseous abnormality identified. Negative visible bowel gas pattern. IMPRESSION: No acute cardiopulmonary abnormality.  Emphysema (ICD10-J43.9). Electronically Signed   By: Genevie Ann M.D.   On: 07/04/2020 16:50   DG Chest Port 1 View  Result Date: 07/03/2020 CLINICAL DATA:  Hypoxia. EXAM: PORTABLE CHEST 1 VIEW COMPARISON:  July 02, 2020. FINDINGS: The heart size and mediastinal contours are within normal limits. No pneumothorax or pleural effusion is noted. Mild bibasilar subsegmental atelectasis is noted. The visualized skeletal structures are unremarkable. IMPRESSION: Mild bibasilar subsegmental atelectasis. Electronically Signed   By: Marijo Conception M.D.   On: 07/03/2020 18:26   DG Chest Port 1 View  Result Date:  07/02/2020 CLINICAL DATA:  Abnormal chest x-ray. EXAM: PORTABLE CHEST 1 VIEW COMPARISON:  06/27/2020 FINDINGS: Underlying COPD with hyperinflation. Superimposed bibasilar airspace disease demonstrates interval improvement from the prior study. No pleural effusion or pneumothorax. Heart size and vascularity normal. Atherosclerotic calcification aortic arch. IMPRESSION: Interval improvement in bibasilar atelectasis/infiltrate. Electronically Signed   By: Franchot Gallo M.D.   On: 07/02/2020 08:08   DG Chest Port 1 View  Result Date: 06/27/2020 CLINICAL DATA:  Altered mental status EXAM: PORTABLE CHEST 1 VIEW COMPARISON:  06/25/2020 FINDINGS: Chronic interstitial prominence. Improved aeration at the left lung base. No pleural effusion or pneumothorax. Stable cardiomediastinal contours. IMPRESSION: Improved aeration at the left lung base since 06/25/2020. No new findings. Electronically Signed   By: Macy Mis M.D.   On: 06/27/2020 08:07   DG Chest Port 1 View  Result Date: 06/24/2020 CLINICAL DATA:  Shortness of breath, pulmonary fibrosis, altered mental status EXAM: PORTABLE CHEST 1 VIEW COMPARISON:  06/03/2020 FINDINGS: Mild cardiomegaly. Emphysema. Mild, diffuse interstitial opacity. Bibasilar fibrotic scarring. The visualized skeletal structures are unremarkable. IMPRESSION: Emphysema and bibasilar fibrotic change with mild, diffuse interstitial opacity, potentially edema. No focal airspace opacity. Electronically Signed   By: Eddie Candle M.D.   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    Patient/Family are satisfied with care plan and all questions have been answered.  This document was prepared using Dragon voice recognition software and may include unintentional dictation errors.     Ottie Glazier, M.D.  Division of [place]

## 2020-07-05 NOTE — Progress Notes (Addendum)
Progress Note    Eric Li  IDP:[id number] DOB: [birth year]  DOA: 06/13/2020 PCP: [name], MD      Brief Narrative:    Medical records reviewed and are as summarized below:  Eric Li is a 85 y.o. male  with history of paroxysmal A. fib on apixaban, HTN, CAD, frequent UTIs, COPD and pulmonary fibrosis with chronic respiratory failure on home O2 at 3 L.  He was brought to the hospital because of altered mental status.  He was found to have acute UTI and altered mental status.  To be due to acute metabolic encephalopathy.  He was treated with IV ceftriaxone.  He developed acute on chronic hypoxemic respiratory failure secondary to exacerbation of pulmonary fibrosis.  He was treated with oxygen via heated high flow nasal cannula.      Assessment/Plan:   Principal Problem:   Acute metabolic encephalopathy Active Problems:   CAD (coronary artery disease)   HTN (hypertension)   UTI (urinary tract infection)   PAF (paroxysmal atrial fibrillation) (HCC)   Pulmonary fibrosis (HCC)   Acute on chronic respiratory failure with hypoxia (HCC)   COPD, severe (HCC)   Abnormal computed tomography angiography (CTA) of abdomen and pelvis   Goals of care, counseling/discussion   Palliative care by specialist   DNR (do not resuscitate) Underlying cognitive impairment/ ?  Dementia Probable acute exacerbation of chronic diastolic CHF   Nutrition Problem: Inadequate oral intake Etiology: acute illness  Signs/Symptoms: meal completion < 25%   Body mass index is 17.9 kg/m.    PLAN  Continue oxygen via heated high flow nasal cannula and taper down to oxygen via nasal cannula as tolerated.  Is now requiring FiO2 of 37% at 30 L/min.  He uses 4 L/min oxygen via nasal cannula at baseline.  Continue prednisone for pulmonary fibrosis.  Continue IV Lasix for probable exacerbation of chronic diastolic CHF  Continue bronchodilators for COPD  Continue Cardizem,  metoprolol and Eliquis for paroxysmal atrial fibrillation  Humalog as needed for hyperglycemia.  Leukocytosis probably due to steroids.  Monitor CBC.  Supportive care for encephalopathy/cognitive impairment     Diet Order            DIET - DYS 1 Room service appropriate? Yes with Assist; Fluid consistency: Thin  Diet effective now                    Consultants:  Pulmonologist  Procedures:  None    Medications:   . acidophilus  1 capsule Oral Daily  . apixaban  2.5 mg Oral BID  . diltiazem  360 mg Oral Daily  . escitalopram  10 mg Oral Daily  . feeding supplement  237 mL Oral BID BM  . fluticasone  2 spray Each Nare Daily  . furosemide  20 mg Intravenous BID  . insulin aspart  0-9 Units Subcutaneous TID WC  . magic mouthwash  15 mL Oral TID  . metoprolol succinate  25 mg Oral Daily  . mometasone-formoterol  2 puff Inhalation BID  . multivitamin with minerals  1 tablet Oral Daily  . nystatin ointment   Topical BID  . pantoprazole  40 mg Oral Daily  . polyethylene glycol  34 g Oral BID  . potassium chloride  20 mEq Oral Daily  . predniSONE  20 mg Oral Q breakfast   Continuous Infusions:   Anti-infectives (From admission, onward)   Start     Dose/Rate Route Frequency Ordered  Stop   06/26/20 2200  cefTRIAXone (ROCEPHIN) 1 g in sodium chloride 0.9 % 100 mL IVPB        1 g 200 mL/hr over 30 Minutes Intravenous Every 24 hours 06/26/20 1137 06/26/20 2237   06/22/20 2200  cefTRIAXone (ROCEPHIN) 1 g in sodium chloride 0.9 % 100 mL IVPB  Status:  Discontinued        1 g 200 mL/hr over 30 Minutes Intravenous Every 24 hours 06/15/2020 2248 06/26/20 1137   05/28/2020 2145  cefTRIAXone (ROCEPHIN) 2 g in sodium chloride 0.9 % 100 mL IVPB        2 g 200 mL/hr over 30 Minutes Intravenous  Once 06/14/2020 2142 06/05/2020 2249             Family Communication/Anticipated D/C date and plan/Code Status   DVT prophylaxis: apixaban (ELIQUIS) tablet 2.5 mg Start:  05/26/2020 2245 apixaban (ELIQUIS) tablet 2.5 mg     Code Status: DNR  Family Communication: Plan discussed with her daughter, Jovita Gamma at the bedside Disposition Plan:    Status is: Inpatient  Remains inpatient appropriate because:Unsafe d/c plan and Severe hypoxia requiring high flow nasal cannula   Dispo:  Patient From: Home  Planned Disposition: [facility]  Expected discharge date: 07/07/20  Medically stable for discharge: No            Subjective:   Interval events noted.  Objective:    Vitals:   07/05/20 0314 07/05/20 0900 07/05/20 1150 07/05/20 1200  BP: 114/70 111/69  116/67  Pulse: (!) 57 (!) 56  (!) 57  Resp: (!) 21 16  20   Temp: 97.8 F (36.6 C) 97.6 F (36.4 C)  97.8 F (36.6 C)  TempSrc: Oral Axillary  Axillary  SpO2: 91% 90% 95% 93%  Weight: 50.3 kg     Height:       No data found.   Intake/Output Summary (Last 24 hours) at 07/05/2020 1553 Last data filed at 07/05/2020 0043 Gross per 24 hour  Intake --  Output 500 ml  Net -500 ml   Filed Weights   07/03/20 0408 07/04/20 0327 07/05/20 0314  Weight: 50 kg 50.3 kg 50.3 kg    Exam:  GEN: NAD SKIN: Warm and dry EYES: EOMI ENT: MMM CV: RRR, he is on 40% FiO2 via high flow nasal cannula. PULM: CTA B ABD: soft, ND, NT, +BS CNS: AAO x 3, non focal EXT: No edema or tenderness   Data Reviewed:   I have personally reviewed following labs and imaging studies:  Labs: Labs show the following:   Basic Metabolic Panel: Recent Labs  Lab 06/30/20 0432 06/30/20 0432 07/01/20 0536 07/01/20 0536 07/02/20 0508 07/02/20 0508 07/03/20 0415 07/05/20 0540  NA 132*  --  137  --  138  --  136 135  K 3.5   < > 3.3*   < > 4.0   < > 4.5 3.7  CL 94*  --  97*  --  99  --  97* 94*  CO2 29  --  28  --  31  --  27 30  GLUCOSE 181*  --  117*  --  114*  --  221* 130*  BUN 22  --  27*  --  37*  --  29* 30*  CREATININE 0.79  --  0.75  --  0.88  --  0.86 0.99  CALCIUM 8.3*  --  8.7*   --  8.9  --  8.9 8.7*  MG  --   --  2.4  --  2.4  --  2.3  --    < > = values in this interval not displayed.   GFR Estimated Creatinine Clearance: 40.2 mL/min (by C-G formula based on SCr of 0.99 mg/dL). Liver Function Tests: No results for input(s): AST, ALT, ALKPHOS, BILITOT, PROT, ALBUMIN in the last 168 hours. No results for input(s): LIPASE, AMYLASE in the last 168 hours. No results for input(s): AMMONIA in the last 168 hours. Coagulation profile No results for input(s): INR, PROTIME in the last 168 hours.  CBC: Recent Labs  Lab 07/01/20 0536 07/02/20 0508 07/03/20 0415  WBC 22.1* 20.0* 17.3*  HGB 15.3 15.4 14.9  HCT 46.2 46.2 46.1  MCV 89.4 89.0 92.6  PLT 257 266 245   Cardiac Enzymes: No results for input(s): CKTOTAL, CKMB, CKMBINDEX, TROPONINI in the last 168 hours. BNP (last 3 results) No results for input(s): PROBNP in the last 8760 hours. CBG: Recent Labs  Lab 07/04/20 1132 07/04/20 1632 07/04/20 2024 07/05/20 0902 07/05/20 1203  GLUCAP 102* 191* 120* 101* 168*   D-Dimer: No results for input(s): DDIMER in the last 72 hours. Hgb A1c: No results for input(s): HGBA1C in the last 72 hours. Lipid Profile: No results for input(s): CHOL, HDL, LDLCALC, TRIG, CHOLHDL, LDLDIRECT in the last 72 hours. Thyroid function studies: No results for input(s): TSH, T4TOTAL, T3FREE, THYROIDAB in the last 72 hours.  Invalid input(s): FREET3 Anemia work up: No results for input(s): VITAMINB12, FOLATE, FERRITIN, TIBC, IRON, RETICCTPCT in the last 72 hours. Sepsis Labs: Recent Labs  Lab 07/01/20 0536 07/02/20 0508 07/03/20 0415  WBC 22.1* 20.0* 17.3*    Microbiology No results found for this or any previous visit (from the past 240 hour(s)).  Procedures and diagnostic studies:  DG Chest Port 1 View  Result Date: 07/04/2020 CLINICAL DATA:  85 year old male with recent hypoxia. Shortness of breath. EXAM: PORTABLE CHEST 1 VIEW COMPARISON:  Portable chest  07/03/2020 and earlier. FINDINGS: Portable AP upright view at 1634 hours. Diffuse emphysema demonstrated on chest CT 03/19/2020. Associated mild bilateral basilar predominant increased interstitial markings appear stable across this series of exams. Stable lung volumes and mediastinal contours. No pneumothorax, pulmonary edema, pleural effusion or acute pulmonary opacity. No acute osseous abnormality identified. Negative visible bowel gas pattern. IMPRESSION: No acute cardiopulmonary abnormality.  Emphysema (ICD10-J43.9). Electronically Signed   By: Genevie Ann M.D.   On: 07/04/2020 16:50   DG Chest Port 1 View  Result Date: 07/03/2020 CLINICAL DATA:  Hypoxia. EXAM: PORTABLE CHEST 1 VIEW COMPARISON:  July 02, 2020. FINDINGS: The heart size and mediastinal contours are within normal limits. No pneumothorax or pleural effusion is noted. Mild bibasilar subsegmental atelectasis is noted. The visualized skeletal structures are unremarkable. IMPRESSION: Mild bibasilar subsegmental atelectasis. Electronically Signed   By: Marijo Conception M.D.   On: 07/03/2020 18:26               LOS: 14 days   [facility]   Pager on [email or web address]. If 7PM-7AM, please contact night-coverage at [email or web address]     07/05/2020, 3:53 PM

## 2020-07-05 NOTE — Care Management Important Message (Signed)
Important Message  Patient Details  Name: Eric Li MRN: [id number] Date of Birth: Jan 15, 1935   Medicare Important Message Given:  Yes     [name] 07/05/2020, 2:22 PM

## 2020-07-05 NOTE — Progress Notes (Addendum)
Nutrition Follow-up  DOCUMENTATION CODES:   Severe malnutrition in context of chronic illness  INTERVENTION:   Ensure Enlive po BID, each supplement provides 350 kcal and 20 grams of protein  Magic cup TID with meals, each supplement provides 290 kcal and 9 grams of protein  MVI daily   Bowel regimen as needed per MD   NUTRITION DIAGNOSIS:   Severe Malnutrition related to chronic illness (COPD, pulmonary fibrosis) as evidenced by severe fat depletion, severe muscle depletion.  GOAL:   Patient will meet greater than or equal to 90% of their needs  MONITOR:   PO intake, Supplement acceptance, Labs, Weight trends, Skin, I & O's  REASON FOR ASSESSMENT:   Consult Assessment of nutrition requirement/status  ASSESSMENT:   85 year old male with history of paroxysmal A. fib on apixaban, HTN, CAD, frequent UTIs, COPD and pulmonary fibrosis with chronic respiratory failure on home O2 at 3 L presenting with altered mental status secondary to UTI.  Met with pt and pt's wife in room today. History obtained from pt's wife at bedside. Wife reports pt with poor appetite and oral intake at baseline. Wife reports a slow decline in pt's oral intake and weight over the past several years. Wife reports that pt eats mainly soft foods such as mashed potatoes, soups, ice cream and sometimes a grilled cheese. Pt does drink Ensure at home. Pt eating anywhere from sips/bites to 50% of meals in hospital. Pt's lunch tray was sitting on his side table with only bites taken from it. Wife reports that pt is drinking 2 Ensure per day and eating some Magic Cups (apparently he loves ice cream). Last BM documented is from 10/7. Per RN report, pt has tried multiple times to have BM today but unsuccessful. Pt refusing miralax. Recommend continue supplements and MVI. Per chart, pt is down ~8lbs since admit. Plan is for SNF at discharge.   Medications reviewed and include: risaquad, lasix, insulin, MVI, protonix,  miralax, KCl, prednisone   Labs reviewed: BUN 30(H) Wbc- 17.3(H) cbgs- 101, 168 x 24 hrs  NUTRITION - FOCUSED PHYSICAL EXAM:    Most Recent Value  Orbital Region Severe depletion  Upper Arm Region Severe depletion  Thoracic and Lumbar Region Severe depletion  Buccal Region Moderate depletion  Temple Region Severe depletion  Clavicle Bone Region Severe depletion  Clavicle and Acromion Bone Region Severe depletion  Scapular Bone Region Severe depletion  Dorsal Hand Severe depletion  Patellar Region Severe depletion  Anterior Thigh Region Severe depletion  Posterior Calf Region Severe depletion  Edema (RD Assessment) None  Hair Reviewed  Eyes Reviewed  Mouth Reviewed  Skin Reviewed  Nails Reviewed     Diet Order:   Diet Order            DIET - DYS 1 Room service appropriate? Yes with Assist; Fluid consistency: Thin  Diet effective now                EDUCATION NEEDS:   Education needs have been addressed  Skin:  Skin Assessment: Reviewed RN Assessment (ecchymosis)  Last BM:  10/7  Height:   Ht Readings from Last 1 Encounters:  05/27/2020 _0  (1.676 m)    Weight:   Wt Readings from Last 1 Encounters:  07/05/20 50.3 kg    Ideal Body Weight:  59 kg  BMI:  Body mass index is 17.9 kg/m.  Estimated Nutritional Needs:   Kcal:  1600-1800kcal/day  Protein:  75-85g/day  Fluid:  1.4-1.6L/day  Koleen Distance  MS, RD, LDN Please refer to [facility] for RD and/or RD on-call/weekend/after hours pager

## 2020-07-05 NOTE — Plan of Care (Signed)

## 2020-07-06 DIAGNOSIS — E43 Unspecified severe protein-calorie malnutrition: Secondary | ICD-10-CM | POA: Insufficient documentation

## 2020-07-06 LAB — CBC WITH DIFFERENTIAL/PLATELET
Abs Immature Granulocytes: 0.34 10*3/uL — ABNORMAL HIGH (ref 0.00–0.07)
Basophils Absolute: 0.1 10*3/uL (ref 0.0–0.1)
Basophils Relative: 0 %
Eosinophils Absolute: 0 10*3/uL (ref 0.0–0.5)
Eosinophils Relative: 0 %
HCT: 46.5 % (ref 39.0–52.0)
Hemoglobin: 14.9 g/dL (ref 13.0–17.0)
Immature Granulocytes: 2 %
Lymphocytes Relative: 10 %
Lymphs Abs: 1.6 10*3/uL (ref 0.7–4.0)
MCH: 29.4 pg (ref 26.0–34.0)
MCHC: 32 g/dL (ref 30.0–36.0)
MCV: 91.7 fL (ref 80.0–100.0)
Monocytes Absolute: 1.4 10*3/uL — ABNORMAL HIGH (ref 0.1–1.0)
Monocytes Relative: 9 %
Neutro Abs: 12.4 10*3/uL — ABNORMAL HIGH (ref 1.7–7.7)
Neutrophils Relative %: 79 %
Platelets: 213 10*3/uL (ref 150–400)
RBC: 5.07 MIL/uL (ref 4.22–5.81)
RDW: 16.6 % — ABNORMAL HIGH (ref 11.5–15.5)
WBC: 15.9 10*3/uL — ABNORMAL HIGH (ref 4.0–10.5)
nRBC: 0 % (ref 0.0–0.2)

## 2020-07-06 LAB — BASIC METABOLIC PANEL
Anion gap: 12 (ref 5–15)
BUN: 31 mg/dL — ABNORMAL HIGH (ref 8–23)
CO2: 28 mmol/L (ref 22–32)
Calcium: 8.5 mg/dL — ABNORMAL LOW (ref 8.9–10.3)
Chloride: 93 mmol/L — ABNORMAL LOW (ref 98–111)
Creatinine, Ser: 0.86 mg/dL (ref 0.61–1.24)
GFR, Estimated: >60 mL/min (ref >60)
Glucose, Bld: 162 mg/dL — ABNORMAL HIGH (ref 70–99)
Potassium: 3.4 mmol/L — ABNORMAL LOW (ref 3.5–5.1)
Sodium: 133 mmol/L — ABNORMAL LOW (ref 135–145)

## 2020-07-06 LAB — PHOSPHORUS: Phosphorus: 2.7 mg/dL (ref 2.5–4.6)

## 2020-07-06 LAB — MAGNESIUM: Magnesium: 2.3 mg/dL (ref 1.7–2.4)

## 2020-07-06 LAB — GLUCOSE, CAPILLARY
Glucose-Capillary: 111 mg/dL — ABNORMAL HIGH (ref 70–99)
Glucose-Capillary: 228 mg/dL — ABNORMAL HIGH (ref 70–99)
Glucose-Capillary: 157 mg/dL — ABNORMAL HIGH (ref 70–99)
Glucose-Capillary: 121 mg/dL — ABNORMAL HIGH (ref 70–99)

## 2020-07-06 MED ORDER — FUROSEMIDE 20 MG PO TABS
20.0000 mg | ORAL_TABLET | Freq: Every day | ORAL | Status: DC
  Administered 2020-07-07 – 2020-07-09 (×3): 20 mg via ORAL
  Filled 2020-07-06 (×3): qty 1

## 2020-07-06 NOTE — Progress Notes (Signed)
Physical Therapy Treatment Patient Details Name: Eric Li MRN: [id number] DOB: [birth year] Today's Date: 07/06/2020    History of Present Illness Eric Li is an 85 y/o male who was admitted with chief complaint of abdominal pain and confusion. Pt found to have a UTI. PMH includes paroxysmal A-fib on Apixaban, HTN, HLD, CAD, CHF, COPD, pulmonary fibrosis with chronic respiratory failure, on home O2 at 3L, bilateral hearing loss, nephrolitiasis, SOB on exertion, and statin intolerance.    PT Comments    Pt presents in fowler's position on arrival to room with wife bedside. He is agreeable to bed level exercises due to being fatigued from OT session. Pt is encouraged to continue moving and attempt to sit EOB, but declines. He is able to perform increased reps of all supine exercises compared to previous sessions, showing increased strength and endurance. However, by the last exercise his eyes start to close and becomes very tired. Pts O2 sats stayed between 93-100 throughout session. He is left in bed with all needs. Pt continues to benefit from skilled PT services to maximize return to PLOF and minimize risk of future falls, injury, caregiver burden, and readmission. Will continue to follow POC. Discharge recommendation remains appropriate.   Follow Up Recommendations  SNF;Supervision for mobility/OOB     Equipment Recommendations  Rolling walker with 5" wheels;Other (comment) (unclear if pt has one)    Recommendations for Other Services       Precautions / Restrictions Precautions Precautions: Fall Restrictions Weight Bearing Restrictions: No    Mobility  Bed Mobility Overal bed mobility: Needs Assistance Bed Mobility: Sit to Supine;Supine to Sit     Supine to sit: Min assist Sit to supine: Mod assist   General bed mobility comments: Did not perform due to pt declining. He notes sitting up with OT and is fatigued from that  Transfers Overall transfer level: Needs  assistance   Transfers: Sit to/from Stand Sit to Stand: Max assist;Total assist         General transfer comment: Did not perform due to pt fatigued  Ambulation/Gait                 Stairs             Wheelchair Mobility    Modified Rankin (Stroke Patients Only)       Balance Overall balance assessment: Needs assistance Sitting-balance support: Feet supported;Bilateral upper extremity supported Sitting balance-Leahy Scale: Poor Sitting balance - Comments: Did not assess due to patient declining Postural control: Left lateral lean     Standing balance comment: Did not assess due to safety and patient declining                            Cognition Arousal/Alertness: Awake/alert Behavior During Therapy: WFL for tasks assessed/performed Overall Cognitive Status: History of cognitive impairments - at baseline                                 General Comments: Pt has cognitive deficits at baseline intermittently however wife notes increased confusion today.      Exercises General Exercises - Upper Extremity Shoulder Flexion: AROM;Both;5 reps;Supine Elbow Flexion: AROM;Both;10 reps;Supine Elbow Extension: AROM;Both;10 reps;Supine;Strengthening (light manual resistance) General Exercises - Lower Extremity Ankle Circles/Pumps: AROM;Both;10 reps;Supine Quad Sets: AROM;Both;10 reps;Supine Heel Slides: AROM;Both;10 reps;Supine Hip ABduction/ADduction: AROM;Both;10 reps;Supine Straight Leg Raises: AROM;Both;10 reps;Supine Other Exercises Other  Exercises: Pt and caregiver educated re HEP: IS, overhead press, scapula retractions, falls prevention, DME recs, d/c recs Other Exercises: Toileting, sup<>sit, sit<>stand, rolling, bridging, sitting balance/tolerance    General Comments        Pertinent Vitals/Pain Pain Assessment: Faces Faces Pain Scale: Hurts whole lot Pain Location: Butt Pain Descriptors / Indicators:  Sore;Grimacing Pain Intervention(s): Monitored during session    Home Living                      Prior Function            PT Goals (current goals can now be found in the care plan section) Acute Rehab PT Goals Patient Stated Goal: to go home - not to go to a facility unless absolutely necessary PT Goal Formulation: With family Time For Goal Achievement: 07/11/20 Potential to Achieve Goals: Fair Progress towards PT goals: Progressing toward goals    Frequency    Min 2X/week      PT Plan Current plan remains appropriate    Co-evaluation              AM-PAC PT "6 Clicks" Mobility   Outcome Measure  Help needed turning from your back to your side while in a flat bed without using bedrails?: A Lot Help needed moving from lying on your back to sitting on the side of a flat bed without using bedrails?: A Lot Help needed moving to and from a bed to a chair (including a wheelchair)?: A Lot Help needed standing up from a chair using your arms (e.g., wheelchair or bedside chair)?: A Lot Help needed to walk in hospital room?: A Lot Help needed climbing 3-5 steps with a railing? : A Lot 6 Click Score: 12    End of Session Equipment Utilized During Treatment: Oxygen Activity Tolerance: Patient limited by fatigue;Patient limited by pain Patient left: in bed;with call bell/phone within reach;with bed alarm set;with family/visitor present Nurse Communication: Mobility status PT Visit Diagnosis: Unsteadiness on feet (R26.81);Other abnormalities of gait and mobility (R26.89);Repeated falls (R29.6);Muscle weakness (generalized) (M62.81);History of falling (Z91.81)     Time: 0263-7858 PT Time Calculation (min) (ACUTE ONLY): 10 min  Charges:                         Eric Li, SPT Eric Li 07/06/2020, 2:15 PM

## 2020-07-06 NOTE — Progress Notes (Signed)
Occupational Therapy Treatment Patient Details Name: Eric Li MRN: [id number] DOB: 1935/01/06 Today's Date: 07/06/2020    History of present illness Eric Li is an 85 y/o male who was admitted with chief complaint of abdominal pain and confusion. Pt found to have a UTI. PMH includes paroxysmal A-fib on Apixaban, HTN, HLD, CAD, CHF, COPD, pulmonary fibrosis with chronic respiratory failure, on home O2 at 3L, bilateral hearing loss, nephrolitiasis, SOB on exertion, and statin intolerance.   OT comments  Eric Li was seen for OT treatment on this date. Upon arrival to room pt on bedpan, wife at bedside. MIN A rolling for bedpan removal, MAX A for perihygiene at bed level. MIN A exit L side of bed. MIN A self-drinking seated EOB - assist for L lateral lean. Pt continues to require MAX/TOTAL A for sit<>stand t/f - B knee buckles in standing. Pt and wife instructed in HEP and demonstrated bed level exercises 1 set x 5 reps each: IS, overhead press, scapula protractions, and elbow flexion. Pt verbalized understanding of instruction provided. Pt making good progress toward goals. Pt continues to benefit from skilled OT services to maximize return to PLOF and minimize risk of future falls, injury, caregiver burden, and readmission. Will continue to follow POC. Discharge recommendation remains appropriate.    Follow Up Recommendations  SNF;Supervision/Assistance - 24 hour    Equipment Recommendations  3 in 1 bedside commode;Hospital bed    Recommendations for Other Services      Precautions / Restrictions Precautions Precautions: Fall Restrictions Weight Bearing Restrictions: No       Mobility Bed Mobility Overal bed mobility: Needs Assistance Bed Mobility: Sit to Supine;Supine to Sit     Supine to sit: Min assist Sit to supine: Mod assist      Transfers Overall transfer level: Needs assistance   Transfers: Sit to/from Stand Sit to Stand: Max assist;Total assist          General transfer comment: B knee buckling    Balance Overall balance assessment: Needs assistance Sitting-balance support: Feet supported;Bilateral upper extremity supported Sitting balance-Leahy Scale: Poor   Postural control: Left lateral lean          ADL either performed or assessed with clinical judgement   ADL Overall ADL's : Needs assistance/impaired      General ADL Comments: MIN A rolling for badpan, MAX A for pericare in sidelying. MIN A self-drinking seated EOB - assist for L lateral lean.                Cognition Arousal/Alertness: Awake/alert Behavior During Therapy: WFL for tasks assessed/performed Overall Cognitive Status: History of cognitive impairments - at baseline           Exercises Exercises: General Upper Extremity;Other exercises General Exercises - Upper Extremity Shoulder Flexion: AROM;Both;5 reps;Supine Elbow Flexion: AROM;Both;5 reps;Supine Elbow Extension: AROM;Both;5 reps;Supine Other Exercises Other Exercises: Pt and caregiver educated re HEP: IS, overhead press, scapula protractions, falls prevention, DME recs, d/c recs Other Exercises: Toileting, sup<>sit, sit<>stand, rolling, bridging, sitting balance/tolerance           Pertinent Vitals/ Pain       Pain Assessment: No/denies pain         Frequency  Min 1X/week        Progress Toward Goals  OT Goals(current goals can now be found in the care plan section)  Progress towards OT goals: Progressing toward goals  Acute Rehab OT Goals Patient Stated Goal: to go home - not to go  to a facility unless absolutely necessary OT Goal Formulation: With patient/family Time For Goal Achievement: 07/11/20 Potential to Achieve Goals: Fair ADL Goals Pt Will Perform Grooming: sitting;with min assist Pt Will Transfer to Toilet: squat pivot transfer;bedside commode;with min assist Pt Will Perform Toileting - Clothing Manipulation and hygiene: with min assist;sitting/lateral  leans;with caregiver independent in assisting;with adaptive equipment ( c LRAD PRN for improved safety and functional independence.)  Plan Discharge plan remains appropriate;Frequency remains appropriate       AM-PAC OT "6 Clicks" Daily Activity     Outcome Measure   Help from another person eating meals?: A Little Help from another person taking care of personal grooming?: A Little Help from another person toileting, which includes using toliet, bedpan, or urinal?: A Lot Help from another person bathing (including washing, rinsing, drying)?: A Lot Help from another person to put on and taking off regular upper body clothing?: A Lot Help from another person to put on and taking off regular lower body clothing?: A Lot 6 Click Score: 14    End of Session Equipment Utilized During Treatment: Oxygen (6L [place])  OT Visit Diagnosis: Other abnormalities of gait and mobility (R26.89);Muscle weakness (generalized) (M62.81);Other symptoms and signs involving cognitive function   Activity Tolerance Patient tolerated treatment well   Patient Left in bed;with call bell/phone within reach;with bed alarm set;with family/visitor present   Nurse Communication Mobility status        Time: 6440-3474 OT Time Calculation (min): 28 min  Charges: OT General Charges $OT Visit: 1 Visit OT Treatments $Self Care/Home Management : 8-22 mins $Therapeutic Exercise: 8-22 mins  Eric Li, M.S. OTR/L  07/06/20, 1:13 PM  ascom [phone]

## 2020-07-06 NOTE — Progress Notes (Signed)
PT Cancellation Note  Patient Details Name: Eric Li MRN: [id number] DOB: May 21, 1935   Cancelled Treatment:    Reason Eval/Treat Not Completed: Other (comment) Pt is eating at this time and wife notes pt has increased confusion this date. Wife advised to come back when he's done eating. Will hold at this time and re-attempt next date/time as available and pt medically appropriate for PT tx session.    Noemi Chapel, SPT Bernita Raisin 07/06/2020, 1:11 PM

## 2020-07-06 NOTE — Plan of Care (Signed)

## 2020-07-06 NOTE — Progress Notes (Signed)
Progress Note    Eric Li  GDJ:[id number] DOB: 1934-10-26  DOA: 06/03/2020 PCP: [name], MD      Brief Narrative:    Medical records reviewed and are as summarized below:  Eric Li is a 85 y.o. male  with history of paroxysmal A. fib on apixaban, HTN, CAD, frequent UTIs, COPD and pulmonary fibrosis with chronic respiratory failure on home O2 at 3 L.  He was brought to the hospital because of altered mental status.  He was found to have acute UTI and altered mental status.  To be due to acute metabolic encephalopathy.  He was treated with IV ceftriaxone.  He developed acute on chronic hypoxemic respiratory failure secondary to exacerbation of pulmonary fibrosis.  He was treated with oxygen via heated high flow nasal cannula.      Assessment/Plan:   Principal Problem:   Acute metabolic encephalopathy Active Problems:   CAD (coronary artery disease)   HTN (hypertension)   UTI (urinary tract infection)   PAF (paroxysmal atrial fibrillation) (HCC)   Pulmonary fibrosis (HCC)   Acute on chronic respiratory failure with hypoxia (HCC)   COPD, severe (HCC)   Abnormal computed tomography angiography (CTA) of abdomen and pelvis   Goals of care, counseling/discussion   Palliative care by specialist   DNR (do not resuscitate)   Protein-calorie malnutrition, severe Underlying cognitive impairment/ ?  Dementia Probable acute exacerbation of chronic diastolic CHF Hypokalemia  Nutrition Problem: Severe Malnutrition Etiology: chronic illness (COPD, pulmonary fibrosis)  Signs/Symptoms: severe fat depletion, severe muscle depletion   Body mass index is 18.03 kg/m.    PLAN  He was on 6 L/min oxygen this morning but oxygen requirements was escalated to 10 L/min via high flow nasal cannula.  Attempt to taper down oxygen as able.  Continue prednisone for pulmonary fibrosis.  IV Lasix has been changed to oral Lasix for CHF.  Continue bronchodilators for  COPD  Continue Cardizem, metoprolol and Eliquis for paroxysmal A. Fib.  Replete potassium and monitor levels  Humalog as needed for hyperglycemia.  Leukocytosis probably due to steroids.  Monitor CBC.  Supportive care for encephalopathy/cognitive impairment     Diet Order            DIET - DYS 1 Room service appropriate? Yes with Assist; Fluid consistency: Thin  Diet effective now                    Consultants:  Pulmonologist  Procedures:  None    Medications:    acidophilus  1 capsule Oral Daily   apixaban  2.5 mg Oral BID   diltiazem  360 mg Oral Daily   escitalopram  10 mg Oral Daily   feeding supplement  237 mL Oral BID BM   fluticasone  2 spray Each Nare Daily   [START ON 07/07/2020] furosemide  20 mg Oral Daily   insulin aspart  0-9 Units Subcutaneous TID WC   magic mouthwash  15 mL Oral TID   metoprolol succinate  25 mg Oral Daily   mometasone-formoterol  2 puff Inhalation BID   multivitamin with minerals  1 tablet Oral Daily   nystatin ointment   Topical BID   pantoprazole  40 mg Oral Daily   polyethylene glycol  34 g Oral BID   potassium chloride  20 mEq Oral Daily   predniSONE  20 mg Oral Q breakfast   Continuous Infusions:   Anti-infectives (From admission, onward)   Start  Dose/Rate Route Frequency Ordered Stop   06/26/20 2200  cefTRIAXone (ROCEPHIN) 1 g in sodium chloride 0.9 % 100 mL IVPB        1 g 200 mL/hr over 30 Minutes Intravenous Every 24 hours 06/26/20 1137 06/26/20 2237   06/22/20 2200  cefTRIAXone (ROCEPHIN) 1 g in sodium chloride 0.9 % 100 mL IVPB  Status:  Discontinued        1 g 200 mL/hr over 30 Minutes Intravenous Every 24 hours 06/02/2020 2248 06/26/20 1137   06/15/2020 2145  cefTRIAXone (ROCEPHIN) 2 g in sodium chloride 0.9 % 100 mL IVPB        2 g 200 mL/hr over 30 Minutes Intravenous  Once 06/01/2020 2142 05/27/2020 2249             Family Communication/Anticipated D/C date and plan/Code  Status   DVT prophylaxis: apixaban (ELIQUIS) tablet 2.5 mg Start: 05/26/2020 2245 apixaban (ELIQUIS) tablet 2.5 mg     Code Status: DNR  Family Communication: Plan discussed with her daughter, Jovita Gamma at the bedside Disposition Plan:    Status is: Inpatient  Remains inpatient appropriate because:Unsafe d/c plan and Severe hypoxia requiring high flow nasal cannula   Dispo:  Patient From: Home  Planned Disposition: [place]  Expected discharge date: 07/09/20  Medically stable for discharge: No            Subjective:   Interval events noted.  He complains of cough.  No chest pain.  He feels short of breath with activity.  Objective:    Vitals:   07/06/20 0344 07/06/20 0351 07/06/20 0847 07/06/20 1200  BP:  105/64    Pulse:  (!) 104    Resp:  20    Temp:  (!) 97.4 F (36.3 C)    TempSrc:  Oral    SpO2:  98% 96% 96%  Weight: 50.7 kg     Height:       No data found.   Intake/Output Summary (Last 24 hours) at 07/06/2020 1629 Last data filed at 07/06/2020 0950 Gross per 24 hour  Intake 960 ml  Output 850 ml  Net 110 ml   Filed Weights   07/04/20 0327 07/05/20 0314 07/06/20 0344  Weight: 50.3 kg 50.3 kg 50.7 kg    Exam:  GEN: No acute distress. SKIN: Warm and dry EYES: No pallor or icterus ENT: MMM CV: RRR PULM: No wheezing heard.  Patient was tolerating 6 L oxygen this morning but unfortunately he was placed back on 10 L/min via high flow nasal cannula. ABD: Soft, nontender CNS: Alert and oriented to person only.  No focal deficits EXT: No edema or tenderness    Data Reviewed:   I have personally reviewed following labs and imaging studies:  Labs: Labs show the following:   Basic Metabolic Panel: Recent Labs  Lab 07/01/20 0536 07/01/20 0536 07/02/20 0508 07/02/20 0508 07/03/20 0415 07/03/20 0415 07/05/20 0540 07/06/20 0415  NA 137  --  138  --  136  --  135 133*  K 3.3*   < > 4.0   < > 4.5   < > 3.7 3.4*  CL 97*   --  99  --  97*  --  94* 93*  CO2 28  --  31  --  27  --  30 28  GLUCOSE 117*  --  114*  --  221*  --  130* 162*  BUN 27*  --  37*  --  29*  --  30* 31*  CREATININE 0.75  --  0.88  --  0.86  --  0.99 0.86  CALCIUM 8.7*  --  8.9  --  8.9  --  8.7* 8.5*  MG 2.4  --  2.4  --  2.3  --   --  2.3  PHOS  --   --   --   --   --   --   --  2.7   < > = values in this interval not displayed.   GFR Estimated Creatinine Clearance: 46.7 mL/min (by C-G formula based on SCr of 0.86 mg/dL). Liver Function Tests: No results for input(s): AST, ALT, ALKPHOS, BILITOT, PROT, ALBUMIN in the last 168 hours. No results for input(s): LIPASE, AMYLASE in the last 168 hours. No results for input(s): AMMONIA in the last 168 hours. Coagulation profile No results for input(s): INR, PROTIME in the last 168 hours.  CBC: Recent Labs  Lab 07/01/20 0536 07/02/20 0508 07/03/20 0415 07/06/20 0415  WBC 22.1* 20.0* 17.3* 15.9*  NEUTROABS  --   --   --  12.4*  HGB 15.3 15.4 14.9 14.9  HCT 46.2 46.2 46.1 46.5  MCV 89.4 89.0 92.6 91.7  PLT 257 266 245 213   Cardiac Enzymes: No results for input(s): CKTOTAL, CKMB, CKMBINDEX, TROPONINI in the last 168 hours. BNP (last 3 results) No results for input(s): PROBNP in the last 8760 hours. CBG: Recent Labs  Lab 07/05/20 1203 07/05/20 1708 07/05/20 1947 07/06/20 0836 07/06/20 1242  GLUCAP 168* 191* 204* 111* 228*   D-Dimer: No results for input(s): DDIMER in the last 72 hours. Hgb A1c: No results for input(s): HGBA1C in the last 72 hours. Lipid Profile: No results for input(s): CHOL, HDL, LDLCALC, TRIG, CHOLHDL, LDLDIRECT in the last 72 hours. Thyroid function studies: No results for input(s): TSH, T4TOTAL, T3FREE, THYROIDAB in the last 72 hours.  Invalid input(s): FREET3 Anemia work up: No results for input(s): VITAMINB12, FOLATE, FERRITIN, TIBC, IRON, RETICCTPCT in the last 72 hours. Sepsis Labs: Recent Labs  Lab 07/01/20 0536 07/02/20 0508  07/03/20 0415 07/06/20 0415  WBC 22.1* 20.0* 17.3* 15.9*    Microbiology No results found for this or any previous visit (from the past 240 hour(s)).  Procedures and diagnostic studies:  DG Chest Port 1 View  Result Date: 07/04/2020 CLINICAL DATA:  85 year old male with recent hypoxia. Shortness of breath. EXAM: PORTABLE CHEST 1 VIEW COMPARISON:  Portable chest 07/03/2020 and earlier. FINDINGS: Portable AP upright view at 1634 hours. Diffuse emphysema demonstrated on chest CT 03/19/2020. Associated mild bilateral basilar predominant increased interstitial markings appear stable across this series of exams. Stable lung volumes and mediastinal contours. No pneumothorax, pulmonary edema, pleural effusion or acute pulmonary opacity. No acute osseous abnormality identified. Negative visible bowel gas pattern. IMPRESSION: No acute cardiopulmonary abnormality.  Emphysema (ICD10-J43.9). Electronically Signed   By: Genevie Ann M.D.   On: 07/04/2020 16:50               LOS: 15 days   [facility]   Pager on [email or web address]. If 7PM-7AM, please contact night-coverage at [email or web address]     07/06/2020, 4:29 PM

## 2020-07-06 NOTE — TOC Progression Note (Signed)
Transition of Care [facility]) - Progression Note    Patient Details  Name: Eric Li MRN: [id number] Date of Birth: [birth year]  Transition of [facility] CM/SW Contact  Eileen Stanford, LCSW Phone Number: 07/06/2020, 2:46 PM  Clinical Narrative:   CSW presented the bed offers to pt's daughter. Pt's daughter is going to check out facilities. Pt's daughter states pt is on 10 L so she has time to go visit the facilities.    Expected Discharge Plan: [place] Barriers to Discharge: Continued Medical Work up  Expected Discharge Plan and Services Expected Discharge Plan: [place] In-house Referral: Clinical Social Work   Post Acute Care Choice: Home Health, Durable Medical Equipment Living arrangements for the past 2 months: [place]                 DME Arranged: Hospital bed, Wheelchair manual, 3-N-1 DME Agency: [employer] Date DME Agency Contacted: 07/03/20 Time DME Agency Contacted: 28 Representative spoke with at DME Agency: Thedore Mins HH Arranged: PT, OT, RN, Nurse's Aide, Social Work [employer] Agency: [facility] [place] Date [place]: 07/03/20 Time [place]: 1530 Representative spoke with at [place]: brittany   Social Determinants of Health ([place]) Interventions    Readmission Risk Interventions No flowsheet data found.

## 2020-07-06 NOTE — Progress Notes (Signed)
Pulmonary Medicine          Date: 07/06/2020,   MRN# [id number] TREG DIEMER 29-Aug-1935     AdmissionWeight: 53.5 kg                 CurrentWeight: 50.7 kg   Referring physician: Dr Roger Shelter   CHIEF COMPLAINT:   Acute hypoxemic respiratory failure   HISTORY OF PRESENT ILLNESS   As per admission h/p Eric Li is a 85 y.o. male with medical history significant for paroxysmal A. fib on apixaban, HTN, CAD, COPD and pulmonary fibrosis with chronic respiratory failure on home O2 at 3 L, who was brought to the emergency room by EMS because of confusion. Family called with concerns for patient waking up disoriented believing he was somewhere else and appeared to be not acting himself and " talking out of his head". Wife also voiced concern for possible UTI as his urination was frequent and with strong odor. He has had no fever or chills, or chest pain or shortness of breath. Has had no nausea, vomiting or change in bowel habits. He is fully vaccinated against Covid. On arrival, his vitals were within normal limits without fever or tachycardia and with O2 sat 97% on O2 at home flow rate . Blood work was significant for WBC of 11,700 and lactic acid of 2>>1.6. Urinalysis showed pyuria. Other blood work mostly unremarkable.  EKG  NSR with RBBB with nonspecific ST-T wave changes  Chest x-ray showed no acute findings, head CT no acute findings. Due to patient complaint of abdominal pain CT abdomen and pelvis with contrast was done which showed asymmetry of the rectum and adjacent portion of the sigmoid colon. Pulmonary consultation for worsening dyspnea with acute on chronic hypoxemic respiratory failure.   06/25/20- patient evaluated at bedside this afternoon.  He had repeat CXR overnight with worsening LLL infiltrate.  Family member at bedside (daughter) shares that he had desatutation event after eating. This suggests aspiration. He made adequate urine with lasix with notable  improvement after diuresis as per family.  MetNEB was unable to be used due to patient being sleepy unable to participate.    06/26/20-  Patient with mild imrpovement.  Noted Palliative evaluation appreciate input. Will institute OT/PT starting tommorow.  Contiue gentle diuresis and MetaNEB therapy as well as current antimicrobials.   06/27/20- Patient seen and examined this morning he seems confused.  Granddaughter at bedside during my examination states he has been having similar symptoms for past 1/2 year and thoughts were revolving around dementia workup. This seems to be episode of sundowning. Patient was significantly more oriented and lucid yesterday mid-day.  He had CXR today with good improvement of previous infiltrates.   06/28/20- patient is down to 45% 45L/min in HFNC which is significantly better then previous. He is resting in bed comfortably. Family at bedside during my evaluation.   06/29/20- patient resting in bed similar setting on HFNC. No changes in recommendations today.   07/02/20-  Net negative 8L,  Repeat CXR included below- with improvement, patient is weaned down to 30/30 on HFNC will transition to regular nasal canula today if RT is available. He was able to get OOB with RT to edge of bed, had BORG 3-4 dyspnea.   Will decrease lasix today due to euvolemic status with new onset pre renal azotemia. Overall significantly improved. Will plan for continued chest physiotherapy with MetaNEB.   07/03/20- Patient resting in bed. Family at bedside. He  is on home setting of 4L/min [place].  He is participating with PT.  Overall respiratory status is much improved. Weaning steroids prednisone taper.   07/04/20- patient had episode of respiratory distress and had to re-initiate HFNC.  He is weaned to 2/40 but saturates in 85-90%.  During my interview and examination he drank water and almost aspirated even with assistance which is concerning for aspiration. Daughter explains since he cannot wear  dentures he is unable to eat well and occasionally notes chocking on liquids and states since he is so weak aspiration has become more problematic.  Will repeat CXR today.   07/05/20- patient resting in bed, he is lucid and in good spirits, was able to shake my hand and speak with me but slow to answer. High flow nasal canula is weaned further to 35/35.  Plan to continue with PT and chest PT utilizing metaneb with RT service.   07/06/20- Was unable to participate with PT today due to having meal.  He did participate with OT.  He is deconditioned.  Leukocytosis has improved.  I spoke with Eric Li (wife) and Daughter Eric Li).and explained that I think he is overall with poor prognosis.  I recommend hospice evaluation.    PAST MEDICAL HISTORY   Past Medical History:  Diagnosis Date   Bilateral hearing loss 03/03/2016   CAD (coronary artery disease) 02/24/2014   CHF (congestive heart failure) (HCC)    COPD, moderate ([place]) 08/29/2015   Hemorrhoids 02/24/2014   HTN (hypertension) 02/24/2014   Hyperlipidemia, unspecified 02/24/2014   Nephrolithiasis    SOB (shortness of breath) on exertion 03/27/2015   Statin intolerance 03/30/2015     SURGICAL HISTORY   Past Surgical History:  Procedure Laterality Date   CHOLECYSTECTOMY     TONSILLECTOMY       FAMILY HISTORY   Family History  Problem Relation Age of Onset   Bladder Cancer Neg Hx    Prolactinoma Neg Hx    Prostate cancer Neg Hx    Kidney cancer Neg Hx      SOCIAL HISTORY   Social History   Tobacco Use   Smoking status: Former Smoker   Smokeless tobacco: Never Used  Scientific laboratory technician Use: Never used  Substance Use Topics   Alcohol use: No   Drug use: No     MEDICATIONS    Home Medication:    Current Medication:  Current Facility-Administered Medications:    acetaminophen (TYLENOL) tablet 650 mg, 650 mg, Oral, Q6H PRN, 650 mg at 07/05/20 1705 **OR** acetaminophen (TYLENOL) suppository 650 mg,  650 mg, Rectal, Q6H PRN, Athena Masse, MD   acidophilus (RISAQUAD) capsule 1 capsule, 1 capsule, Oral, Daily, Shahmehdi, Seyed A, MD, 1 capsule at 07/06/20 0914   apixaban (ELIQUIS) tablet 2.5 mg, 2.5 mg, Oral, BID, Judd Gaudier V, MD, 2.5 mg at 07/06/20 0915   diclofenac Sodium (VOLTAREN) 1 % topical gel 2 g, 2 g, Topical, BID PRN, Shahmehdi, Seyed A, MD   diltiazem (CARDIZEM CD) 24 hr capsule 360 mg, 360 mg, Oral, Daily, Amin, Soundra Pilon, MD, 360 mg at 07/06/20 0914   escitalopram (LEXAPRO) tablet 10 mg, 10 mg, Oral, Daily, Judd Gaudier V, MD, 10 mg at 07/06/20 0917   feeding supplement (ENSURE ENLIVE) (ENSURE ENLIVE) liquid 237 mL, 237 mL, Oral, BID BM, Amin, Sumayya, MD, 237 mL at 07/06/20 0923   fluticasone (FLONASE) 50 MCG/ACT nasal spray 2 spray, 2 spray, Each Nare, Daily, Athena Masse, MD, 2 spray at  07/06/20 0916   [START ON 07/07/2020] furosemide (LASIX) tablet 20 mg, 20 mg, Oral, Daily, Jennye Boroughs, MD   insulin aspart (novoLOG) injection 0-9 Units, 0-9 Units, Subcutaneous, TID WC, Shahmehdi, Seyed A, MD, 3 Units at 07/06/20 1255   levalbuterol (XOPENEX) nebulizer solution 0.63 mg, 0.63 mg, Nebulization, Q6H PRN, Lang Snow, NP, 0.63 mg at 06/27/20 0845   magic mouthwash, 15 mL, Oral, TID, Lorella Nimrod, MD, 15 mL at 07/06/20 0918   metoprolol succinate (TOPROL-XL) 24 hr tablet 25 mg, 25 mg, Oral, Daily, Ottie Glazier, MD, 25 mg at 07/06/20 2951   mometasone-formoterol (DULERA) 200-5 MCG/ACT inhaler 2 puff, 2 puff, Inhalation, BID, Athena Masse, MD, 2 puff at 07/06/20 0916   multivitamin with minerals tablet 1 tablet, 1 tablet, Oral, Daily, Athena Masse, MD, 1 tablet at 07/06/20 0915   nitroGLYCERIN (NITROSTAT) SL tablet 0.4 mg, 0.4 mg, Sublingual, Q5 min PRN, Athena Masse, MD   nystatin ointment (MYCOSTATIN), , Topical, BID, Enzo Bi, MD, Given at 07/06/20 0916   ondansetron (ZOFRAN) tablet 4 mg, 4 mg, Oral, Q6H PRN **OR** ondansetron  (ZOFRAN) injection 4 mg, 4 mg, Intravenous, Q6H PRN, Athena Masse, MD   pantoprazole (PROTONIX) EC tablet 40 mg, 40 mg, Oral, Daily, Judd Gaudier V, MD, 40 mg at 07/06/20 0915   polyethylene glycol (MIRALAX / GLYCOLAX) packet 34 g, 34 g, Oral, BID, Enzo Bi, MD, 34 g at 07/06/20 0915   potassium chloride 20 MEQ/15ML (10%) solution 20 mEq, 20 mEq, Oral, Daily, Oswald Hillock, RPH, 20 mEq at 07/06/20 0915   predniSONE (DELTASONE) tablet 20 mg, 20 mg, Oral, Q breakfast, Ottie Glazier, MD, 20 mg at 07/06/20 0915   psyllium (HYDROCIL/METAMUCIL) 1 packet, 1 packet, Oral, Daily PRN, Athena Masse, MD, 1 packet at 07/06/20 0915   traMADol (ULTRAM) tablet 50 mg, 50 mg, Oral, Q6H PRN, Shahmehdi, Seyed A, MD, 50 mg at 07/03/20 2116   traZODone (DESYREL) tablet 50 mg, 50 mg, Oral, QHS PRN, Skipper Cliche A, MD, 50 mg at 07/05/20 2107    ALLERGIES   Simvastatin     REVIEW OF SYSTEMS    Review of Systems:  Gen:  Denies  fever, sweats, chills weigh loss  HEENT: Denies blurred vision, double vision, ear pain, eye pain, hearing loss, nose bleeds, sore throat Cardiac:  No dizziness, chest pain or heaviness, chest tightness,edema Resp:   Denies cough or sputum porduction, shortness of breath,wheezing, hemoptysis,  Gi: Denies swallowing difficulty, stomach pain, nausea or vomiting, diarrhea, constipation, bowel incontinence Gu:  Denies bladder incontinence, burning urine Ext:   Denies Joint pain, stiffness or swelling Skin: Denies  skin rash, easy bruising or bleeding or hives Endoc:  Denies polyuria, polydipsia , polyphagia or weight change Psych:   Denies depression, insomnia or hallucinations   Other:  All other systems negative   VS: BP 105/64 (BP Location: Left Arm)    Pulse (!) 104    Temp (!) 97.4 F (36.3 C) (Oral)    Resp 20    Ht 5\' 6"  (1.676 m)    Wt 50.7 kg    SpO2 96%    BMI 18.03 kg/m      PHYSICAL EXAM    GENERAL:NAD, no fevers, chills, no weakness no  fatigue HEAD: Normocephalic, atraumatic.  EYES: Pupils equal, round, reactive to light. Extraocular muscles intact. No scleral icterus.  MOUTH: Moist mucosal membrane. Dentition intact. No abscess noted.  EAR, NOSE, THROAT: Clear without exudates. No external lesions.  NECK: Supple. No thyromegaly. No nodules. No JVD.  PULMONARY: mild ronchi bilaterally  CARDIOVASCULAR: S1 and S2. Regular rate and rhythm. No murmurs, rubs, or gallops. No edema. Pedal pulses 2+ bilaterally.  GASTROINTESTINAL: Soft, nontender, nondistended. No masses. Positive bowel sounds. No hepatosplenomegaly.  MUSCULOSKELETAL: No swelling, clubbing, or edema. Range of motion full in all extremities.  NEUROLOGIC: Cranial nerves II through XII are intact. No gross focal neurological deficits. Sensation intact. Reflexes intact.  SKIN: No ulceration, lesions, rashes, or cyanosis. Skin warm and dry. Turgor intact.  PSYCHIATRIC: Mood, affect within normal limits. The patient is awake, alert and oriented x 3. Insight, judgment intact.       IMAGING    DG Chest 1 View  Result Date: 06/25/2020 CLINICAL DATA:  Shortness of breath EXAM: CHEST  1 VIEW COMPARISON:  June 24, 2020 FINDINGS: The heart size and mediastinal contours are unchanged with mild cardiomegaly. Aortic knob calcifications are seen. Again noted is hyperinflation of the lung zones with flattening of the hemidiaphragms. There is mildly increased interstitial opacities seen at both lower lungs. There is new hazy airspace opacity seen at the left lung base. No acute osseous abnormality. IMPRESSION: Increased hazy airspace opacity at the left lung base which could be due to atelectasis and/or infectious etiology. Probable chronic interstitial opacities at both lungs. Electronically Signed   By: Prudencio Pair M.D.   On: 06/25/2020 03:47   DG Chest 2 View  Result Date: 06/15/2020 CLINICAL DATA:  Suspect sepsis. EXAM: CHEST - 2 VIEW COMPARISON:  03/25/2019 FINDINGS: Heart  size and vascularity normal. Mild bibasilar atelectasis. Small right effusion. Elevated left hemidiaphragm with bowel gas below the left diaphragm. Mild hyperinflation lungs. No mass lesion. No acute skeletal abnormality. IMPRESSION: Mild bibasilar atelectasis and small right effusion.   COPD. Electronically Signed   By: Franchot Gallo M.D.   On: 05/25/2020 13:35   CT Head Wo Contrast  Result Date: 06/15/2020 CLINICAL DATA:  Altered mental status. EXAM: CT HEAD WITHOUT CONTRAST TECHNIQUE: Contiguous axial images were obtained from the base of the skull through the vertex without intravenous contrast. COMPARISON:  March 16, 2018 FINDINGS: Brain: There is mild cerebral atrophy with widening of the extra-axial spaces and ventricular dilatation. There are areas of decreased attenuation within the white matter tracts of the supratentorial brain, consistent with microvascular disease changes. Vascular: No hyperdense vessel or unexpected calcification. Skull: Normal. Negative for fracture or focal lesion. Sinuses/Orbits: Very mild, bilateral ethmoid sinus mucosal thickening is seen. Other: None. IMPRESSION: 1. Generalized cerebral atrophy. 2. Very mild, bilateral ethmoid sinus disease. 3. No acute intracranial abnormality. Electronically Signed   By: Virgina Norfolk M.D.   On: 06/15/2020 21:11   MR PELVIS WO CONTRAST  Result Date: 06/24/2020 CLINICAL DATA:  Possible rectal mass on CT EXAM: MRI PELVIS WITHOUT CONTRAST TECHNIQUE: Multiplanar multisequence MR imaging of the pelvis was performed. No intravenous contrast was administered. Small amount of [place] gel was administered per rectum to optimize tumor evaluation. COMPARISON:  CT abdomen/pelvis dated 06/19/2020 FINDINGS: Urinary Tract:  Bladder is within normal limits. Bowel: Distension of the rectum with ultrasound gel. No rectal mass is seen. Visualized bowel is otherwise unremarkable, including the sigmoid colon. Vascular/Lymphatic: No evidence of aneurysm. No  suspicious pelvic lymphadenopathy. Reproductive:  Suspected postsurgical changes related to prior TURP. Other:  No pelvic ascites. Musculoskeletal: Mild degenerative changes of the lower lumbar spine. IMPRESSION: No rectosigmoid mass is evident on MR. Negative pelvic MRI. Electronically Signed   By: Julian Hy  M.D.   On: 06/24/2020 12:15   MR ABDOMEN W WO CONTRAST  Result Date: 06/24/2020 CLINICAL DATA:  Inpatient. Frequent UTI. COPD and pulmonary fibrosis with chronic respiratory failure. Confusion. Concern for rectal mass on recent CT. EXAM: MRI ABDOMEN WITHOUT AND WITH CONTRAST TECHNIQUE: Multiplanar multisequence MR imaging of the abdomen was performed both before and after the administration of intravenous contrast. CONTRAST:  7.3mL GADAVIST GADOBUTROL 1 MMOL/ML IV SOLN COMPARISON:  06/14/2020 CT abdomen/pelvis. FINDINGS: Lower chest: Small dependent bilateral pleural effusions, right greater than left. Hepatobiliary: Normal liver size and configuration. No hepatic steatosis. No liver mass. Cholecystectomy. Bile ducts are within normal post cholecystectomy limits. Common bile duct diameter 5 mm. No choledocholithiasis. No biliary masses, strictures or beading. Probable 12 mm diameter cystic duct remnant in the porta hepatis (series 4/image 14). Pancreas: No pancreatic mass or duct dilation.  No pancreas divisum. Spleen: Normal size. No mass. Adrenals/Urinary Tract: Normal adrenals. No hydronephrosis. Scattered simple subcentimeter renal cortical cysts in both kidneys. No suspicious renal masses. Stomach/Bowel: Normal non-distended stomach. Visualized small and large bowel is normal caliber, with no bowel wall thickening. Vascular/Lymphatic: Atherosclerotic nonaneurysmal abdominal aorta. Patent portal, splenic, hepatic and renal veins. Retroaortic left renal vein. No pathologically enlarged lymph nodes in the abdomen. Other: No abdominal ascites or focal fluid collection. Musculoskeletal: No  aggressive appearing focal osseous lesions. Mild L4 vertebral compression fracture, chronic appearing. IMPRESSION: 1. No lymphadenopathy or other findings to suggest metastatic disease in the abdomen. MRI pelvis to be reported separately. 2. Bile ducts are within normal post cholecystectomy limits (CBD diameter 5 mm). No choledocholithiasis. Probable small cystic duct remnant in the porta hepatis. 3. Small dependent bilateral pleural effusions, right greater than left. 4. Mild L4 vertebral compression fracture, chronic appearing. Electronically Signed   By: Ilona Sorrel M.D.   On: 06/24/2020 07:01   CT ABDOMEN PELVIS W CONTRAST  Result Date: 06/19/2020 CLINICAL DATA:  Abdominal pain. EXAM: CT ABDOMEN AND PELVIS WITH CONTRAST TECHNIQUE: Multidetector CT imaging of the abdomen and pelvis was performed using the standard protocol following bolus administration of intravenous contrast. CONTRAST:  115mL OMNIPAQUE IOHEXOL 300 MG/ML  SOLN COMPARISON:  None. FINDINGS: Lower chest: A trace amount of atelectasis is seen within the right lung base. A very small right pleural effusion is noted. Hepatobiliary: No focal liver abnormality is seen. Status post cholecystectomy. No biliary dilatation. Pancreas: Unremarkable. No pancreatic ductal dilatation or surrounding inflammatory changes. Spleen: Normal in size without focal abnormality. Adrenals/Urinary Tract: Adrenal glands are unremarkable. Kidneys are normal, without renal calculi, focal lesion, or hydronephrosis. Bladder is unremarkable. Stomach/Bowel: Stomach is within normal limits. Appendix appears normal. No evidence of bowel dilatation. There is marked severity thickening of the rectum and adjacent portion of the distal sigmoid colon (axial CT images 71 through 75, CT series number 2). This is slightly asymmetric in appearance, right greater than left. No associated inflammatory fat stranding is seen. Vascular/Lymphatic: There is marked severity calcification and  atherosclerosis of the abdominal aorta and bilateral common iliac arteries, without evidence of aneurysmal dilatation. No enlarged abdominal or pelvic lymph nodes. Reproductive: The prostate gland is mildly enlarged. Other: No abdominal wall hernia or abnormality. No abdominopelvic ascites. Musculoskeletal: Moderate to marked severity multilevel degenerative changes seen throughout the lumbar spine. IMPRESSION: 1. Asymmetric of the rectum and adjacent portion of the sigmoid colon, which may be secondary to an underlying mass. Correlation with physical examination and MRI is recommended. 2. Evidence of prior cholecystectomy. 3. Very small right pleural effusion.  4. Mildly enlarged prostate gland. 5. Moderate to marked severity multilevel degenerative changes throughout the lumbar spine. 6. Aortic atherosclerosis. Aortic Atherosclerosis (ICD10-I70.0). Electronically Signed   By: Virgina Norfolk M.D.   On: 06/06/2020 21:22   DG Chest Port 1 View  Result Date: 07/04/2020 CLINICAL DATA:  85 year old male with recent hypoxia. Shortness of breath. EXAM: PORTABLE CHEST 1 VIEW COMPARISON:  Portable chest 07/03/2020 and earlier. FINDINGS: Portable AP upright view at 1634 hours. Diffuse emphysema demonstrated on chest CT 03/19/2020. Associated mild bilateral basilar predominant increased interstitial markings appear stable across this series of exams. Stable lung volumes and mediastinal contours. No pneumothorax, pulmonary edema, pleural effusion or acute pulmonary opacity. No acute osseous abnormality identified. Negative visible bowel gas pattern. IMPRESSION: No acute cardiopulmonary abnormality.  Emphysema (ICD10-J43.9). Electronically Signed   By: Genevie Ann M.D.   On: 07/04/2020 16:50   DG Chest Port 1 View  Result Date: 07/03/2020 CLINICAL DATA:  Hypoxia. EXAM: PORTABLE CHEST 1 VIEW COMPARISON:  July 02, 2020. FINDINGS: The heart size and mediastinal contours are within normal limits. No pneumothorax or  pleural effusion is noted. Mild bibasilar subsegmental atelectasis is noted. The visualized skeletal structures are unremarkable. IMPRESSION: Mild bibasilar subsegmental atelectasis. Electronically Signed   By: Marijo Conception M.D.   On: 07/03/2020 18:26   DG Chest Port 1 View  Result Date: 07/02/2020 CLINICAL DATA:  Abnormal chest x-ray. EXAM: PORTABLE CHEST 1 VIEW COMPARISON:  06/27/2020 FINDINGS: Underlying COPD with hyperinflation. Superimposed bibasilar airspace disease demonstrates interval improvement from the prior study. No pleural effusion or pneumothorax. Heart size and vascularity normal. Atherosclerotic calcification aortic arch. IMPRESSION: Interval improvement in bibasilar atelectasis/infiltrate. Electronically Signed   By: Franchot Gallo M.D.   On: 07/02/2020 08:08   DG Chest Port 1 View  Result Date: 06/27/2020 CLINICAL DATA:  Altered mental status EXAM: PORTABLE CHEST 1 VIEW COMPARISON:  06/25/2020 FINDINGS: Chronic interstitial prominence. Improved aeration at the left lung base. No pleural effusion or pneumothorax. Stable cardiomediastinal contours. IMPRESSION: Improved aeration at the left lung base since 06/25/2020. No new findings. Electronically Signed   By: Macy Mis M.D.   On: 06/27/2020 08:07   DG Chest Port 1 View  Result Date: 06/24/2020 CLINICAL DATA:  Shortness of breath, pulmonary fibrosis, altered mental status EXAM: PORTABLE CHEST 1 VIEW COMPARISON:  06/06/2020 FINDINGS: Mild cardiomegaly. Emphysema. Mild, diffuse interstitial opacity. Bibasilar fibrotic scarring. The visualized skeletal structures are unremarkable. IMPRESSION: Emphysema and bibasilar fibrotic change with mild, diffuse interstitial opacity, potentially edema. No focal airspace opacity. Electronically Signed   By: Eddie Candle M.D.   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    Patient/Family are satisfied with care plan and all questions have been answered.  This  document was prepared using Systems analyst and may include unintentional dictation errors.     Ottie Glazier, M.D.  Division of [place]

## 2020-07-07 LAB — GLUCOSE, CAPILLARY
Glucose-Capillary: 110 mg/dL — ABNORMAL HIGH (ref 70–99)
Glucose-Capillary: 145 mg/dL — ABNORMAL HIGH (ref 70–99)

## 2020-07-07 MED ORDER — MORPHINE SULFATE (PF) 2 MG/ML IV SOLN
2.0000 mg | INTRAVENOUS | Status: DC | PRN
  Administered 2020-07-07 – 2020-07-09 (×6): 2 mg via INTRAVENOUS
  Filled 2020-07-07 (×7): qty 1

## 2020-07-07 MED ORDER — PREDNISONE 10 MG PO TABS
10.0000 mg | ORAL_TABLET | Freq: Every day | ORAL | Status: DC
  Administered 2020-07-07 – 2020-07-09 (×3): 10 mg via ORAL
  Filled 2020-07-07 (×3): qty 1

## 2020-07-07 NOTE — Progress Notes (Addendum)
Progress Note    [name]  KYH:[id number] DOB: 1935-07-07  DOA: 06/18/2020 PCP: [name], MD      Brief Narrative:    Medical records reviewed and are as summarized below:  Eric Li is a 85 y.o. male  with history of paroxysmal A. fib on apixaban, HTN, CAD, frequent UTIs, COPD and pulmonary fibrosis with chronic respiratory failure on home O2 at 3 L.  He was brought to the hospital because of altered mental status.  He was found to have acute UTI and altered mental status.  To be due to acute metabolic encephalopathy.  He was treated with IV ceftriaxone.  He developed acute on chronic hypoxemic respiratory failure secondary to exacerbation of pulmonary fibrosis.  He was treated with oxygen via heated high flow nasal cannula.      Assessment/Plan:   Principal Problem:   Acute metabolic encephalopathy Active Problems:   CAD (coronary artery disease)   HTN (hypertension)   UTI (urinary tract infection)   PAF (paroxysmal atrial fibrillation) (HCC)   Pulmonary fibrosis (HCC)   Acute on chronic respiratory failure with hypoxia (HCC)   COPD, severe (HCC)   Abnormal computed tomography angiography (CTA) of abdomen and pelvis   Goals of care, counseling/discussion   Palliative care by specialist   DNR (do not resuscitate)   Protein-calorie malnutrition, severe Underlying cognitive impairment/ ?  Dementia Probable acute exacerbation of chronic diastolic CHF Hypokalemia  Nutrition Problem: Severe Malnutrition Etiology: chronic illness (COPD, pulmonary fibrosis)  Signs/Symptoms: severe fat depletion, severe muscle depletion   Body mass index is 17.38 kg/m.    PLAN  Patient has required 10 l/min oxygen via high flow nasal cannula. He is requiring high amounts of oxygen and weaning down oxygen has been a problem.  I had a long discussion with the patient and his daughter, [name], at the bedside. Goals of care were discussed. They have decided to  proceed with hospice and her requested for comfort measures only. In the meantime, his daughter wants him to continue with his oral medications.  Hospice team has been consulted.  Continue prednisone for pulmonary fibrosis  Continue oral Lasix for CHF  Continue Cardizem, metoprolol and Eliquis of paroxysmal atrial fibrillation.     Diet Order            DIET - DYS 1 Room service appropriate? Yes with Assist; Fluid consistency: Thin  Diet effective now                    Consultants:  Pulmonologist  Procedures:  None    Medications:   . acidophilus  1 capsule Oral Daily  . apixaban  2.5 mg Oral BID  . diltiazem  360 mg Oral Daily  . escitalopram  10 mg Oral Daily  . feeding supplement  237 mL Oral BID BM  . fluticasone  2 spray Each Nare Daily  . furosemide  20 mg Oral Daily  . magic mouthwash  15 mL Oral TID  . metoprolol succinate  25 mg Oral Daily  . mometasone-formoterol  2 puff Inhalation BID  . multivitamin with minerals  1 tablet Oral Daily  . nystatin ointment   Topical BID  . pantoprazole  40 mg Oral Daily  . polyethylene glycol  34 g Oral BID  . potassium chloride  20 mEq Oral Daily  . predniSONE  10 mg Oral Q breakfast   Continuous Infusions:   Anti-infectives (From admission, onward)  Start     Dose/Rate Route Frequency Ordered Stop   06/26/20 2200  cefTRIAXone (ROCEPHIN) 1 g in sodium chloride 0.9 % 100 mL IVPB        1 g 200 mL/hr over 30 Minutes Intravenous Every 24 hours 06/26/20 1137 06/26/20 2237   06/22/20 2200  cefTRIAXone (ROCEPHIN) 1 g in sodium chloride 0.9 % 100 mL IVPB  Status:  Discontinued        1 g 200 mL/hr over 30 Minutes Intravenous Every 24 hours 05/25/2020 2248 06/26/20 1137   06/03/2020 2145  cefTRIAXone (ROCEPHIN) 2 g in sodium chloride 0.9 % 100 mL IVPB        2 g 200 mL/hr over 30 Minutes Intravenous  Once 06/13/2020 2142 06/09/2020 2249             Family Communication/Anticipated D/C date and plan/Code  Status   DVT prophylaxis: apixaban (ELIQUIS) tablet 2.5 mg Start: 06/03/2020 2245 apixaban (ELIQUIS) tablet 2.5 mg     Code Status: DNR  Family Communication: Plan discussed with her daughter, Eric Li at the bedside Disposition Plan:    Status is: Inpatient  Remains inpatient appropriate because:Unsafe d/c plan and Severe hypoxia requiring high flow nasal cannula   Dispo:  Patient From: Home  Planned Disposition: Home with hospice  Expected discharge date: 07/09/20  Medically stable for discharge: Yes            Subjective:   Interval events noted. His daughter, [name], is at the bedside. Eric Li said patient has had intermittent episodes of confusion but there are times when he is completely oriented.   Objective:    Vitals:   07/07/20 0800 07/07/20 0953 07/07/20 1039 07/07/20 1200  BP: 114/73   102/80  Pulse: 68 (!) 119  (!) 115  Resp: (!) 28   20  Temp: 97.8 F (36.6 C)   98.2 F (36.8 C)  TempSrc: Oral   Oral  SpO2: 93%  98% 95%  Weight:      Height:       No data found.   Intake/Output Summary (Last 24 hours) at 07/07/2020 1436 Last data filed at 07/07/2020 1100 Gross per 24 hour  Intake --  Output 1300 ml  Net -1300 ml   Filed Weights   07/05/20 0314 07/06/20 0344 07/07/20 0424  Weight: 50.3 kg 50.7 kg 48.9 kg    Exam:  GEN: NAD SKIN: Warm and dry EYES: EOMI ENT: MMM CV: RRR PULM: CTA B ABD: soft, ND, NT, +BS CNS: AAO x 3, non focal EXT: No edema or tenderness     Data Reviewed:   I have personally reviewed following labs and imaging studies:  Labs: Labs show the following:   Basic Metabolic Panel: Recent Labs  Lab 07/01/20 0536 07/01/20 0536 07/02/20 0508 07/02/20 0508 07/03/20 0415 07/03/20 0415 07/05/20 0540 07/06/20 0415  NA 137  --  138  --  136  --  135 133*  K 3.3*   < > 4.0   < > 4.5   < > 3.7 3.4*  CL 97*  --  99  --  97*  --  94* 93*  CO2 28  --  31  --  27  --  30 28  GLUCOSE 117*  --  114*  --  221*   --  130* 162*  BUN 27*  --  37*  --  29*  --  30* 31*  CREATININE 0.75  --  0.88  --  0.86  --  0.99 0.86  CALCIUM 8.7*  --  8.9  --  8.9  --  8.7* 8.5*  MG 2.4  --  2.4  --  2.3  --   --  2.3  PHOS  --   --   --   --   --   --   --  2.7   < > = values in this interval not displayed.   GFR Estimated Creatinine Clearance: 45 mL/min (by C-G formula based on SCr of 0.86 mg/dL). Liver Function Tests: No results for input(s): AST, ALT, ALKPHOS, BILITOT, PROT, ALBUMIN in the last 168 hours. No results for input(s): LIPASE, AMYLASE in the last 168 hours. No results for input(s): AMMONIA in the last 168 hours. Coagulation profile No results for input(s): INR, PROTIME in the last 168 hours.  CBC: Recent Labs  Lab 07/01/20 0536 07/02/20 0508 07/03/20 0415 07/06/20 0415  WBC 22.1* 20.0* 17.3* 15.9*  NEUTROABS  --   --   --  12.4*  HGB 15.3 15.4 14.9 14.9  HCT 46.2 46.2 46.1 46.5  MCV 89.4 89.0 92.6 91.7  PLT 257 266 245 213   Cardiac Enzymes: No results for input(s): CKTOTAL, CKMB, CKMBINDEX, TROPONINI in the last 168 hours. BNP (last 3 results) No results for input(s): PROBNP in the last 8760 hours. CBG: Recent Labs  Lab 07/06/20 1242 07/06/20 1636 07/06/20 2127 07/07/20 0800 07/07/20 1146  GLUCAP 228* 157* 121* 110* 145*   D-Dimer: No results for input(s): DDIMER in the last 72 hours. Hgb A1c: No results for input(s): HGBA1C in the last 72 hours. Lipid Profile: No results for input(s): CHOL, HDL, LDLCALC, TRIG, CHOLHDL, LDLDIRECT in the last 72 hours. Thyroid function studies: No results for input(s): TSH, T4TOTAL, T3FREE, THYROIDAB in the last 72 hours.  Invalid input(s): FREET3 Anemia work up: No results for input(s): VITAMINB12, FOLATE, FERRITIN, TIBC, IRON, RETICCTPCT in the last 72 hours. Sepsis Labs: Recent Labs  Lab 07/01/20 0536 07/02/20 0508 07/03/20 0415 07/06/20 0415  WBC 22.1* 20.0* 17.3* 15.9*    Microbiology No results found for this or any  previous visit (from the past 240 hour(s)).  Procedures and diagnostic studies:  No results found.             LOS: 16 days   [place] [employer], advertising on [email or web address]. If 7PM-7AM, please contact night-coverage at [email or web address]     07/07/2020, 2:36 PM

## 2020-07-07 NOTE — Progress Notes (Signed)
Pulmonary Medicine          Date: 07/07/2020,   MRN# [id number] Eric Li 04-22-1935     AdmissionWeight: 53.5 kg                 CurrentWeight: 48.9 kg   Referring physician: Dr Roger Shelter   CHIEF COMPLAINT:   Acute hypoxemic respiratory failure   HISTORY OF PRESENT ILLNESS   As per admission h/p Eric Li is a 85 y.o. male with medical history significant for paroxysmal A. fib on apixaban, HTN, CAD, COPD and pulmonary fibrosis with chronic respiratory failure on home O2 at 3 L, who was brought to the emergency room by EMS because of confusion. Family called with concerns for patient waking up disoriented believing he was somewhere else and appeared to be not acting himself and " talking out of his head". Wife also voiced concern for possible UTI as his urination was frequent and with strong odor. He has had no fever or chills, or chest pain or shortness of breath. Has had no nausea, vomiting or change in bowel habits. He is fully vaccinated against Covid. On arrival, his vitals were within normal limits without fever or tachycardia and with O2 sat 97% on O2 at home flow rate . Blood work was significant for WBC of 11,700 and lactic acid of 2>>1.6. Urinalysis showed pyuria. Other blood work mostly unremarkable.  EKG  NSR with RBBB with nonspecific ST-T wave changes  Chest x-ray showed no acute findings, head CT no acute findings. Due to patient complaint of abdominal pain CT abdomen and pelvis with contrast was done which showed asymmetry of the rectum and adjacent portion of the sigmoid colon. Pulmonary consultation for worsening dyspnea with acute on chronic hypoxemic respiratory failure.   06/25/20- patient evaluated at bedside this afternoon.  He had repeat CXR overnight with worsening LLL infiltrate.  Family member at bedside (daughter) shares that he had desatutation event after eating. This suggests aspiration. He made adequate urine with lasix with notable  improvement after diuresis as per family.  MetNEB was unable to be used due to patient being sleepy unable to participate.    06/26/20-  Patient with mild imrpovement.  Noted Palliative evaluation appreciate input. Will institute OT/PT starting tommorow.  Contiue gentle diuresis and MetaNEB therapy as well as current antimicrobials.   06/27/20- Patient seen and examined this morning he seems confused.  Granddaughter at bedside during my examination states he has been having similar symptoms for past 1/2 year and thoughts were revolving around dementia workup. This seems to be episode of sundowning. Patient was significantly more oriented and lucid yesterday mid-day.  He had CXR today with good improvement of previous infiltrates.   06/28/20- patient is down to 45% 45L/min in HFNC which is significantly better then previous. He is resting in bed comfortably. Family at bedside during my evaluation.   06/29/20- patient resting in bed similar setting on HFNC. No changes in recommendations today.   07/02/20-  Net negative 8L,  Repeat CXR included below- with improvement, patient is weaned down to 30/30 on HFNC will transition to regular nasal canula today if RT is available. He was able to get OOB with RT to edge of bed, had BORG 3-4 dyspnea.   Will decrease lasix today due to euvolemic status with new onset pre renal azotemia. Overall significantly improved. Will plan for continued chest physiotherapy with MetaNEB.   07/03/20- Patient resting in bed. Family at bedside. He  is on home setting of 4L/min [place].  He is participating with PT.  Overall respiratory status is much improved. Weaning steroids prednisone taper.   07/04/20- patient had episode of respiratory distress and had to re-initiate HFNC.  He is weaned to 83/40 but saturates in 85-90%.  During my interview and examination he drank water and almost aspirated even with assistance which is concerning for aspiration. Daughter explains since he cannot wear  dentures he is unable to eat well and occasionally notes chocking on liquids and states since he is so weak aspiration has become more problematic.  Will repeat CXR today.   07/05/20- patient resting in bed, he is lucid and in good spirits, was able to shake my hand and speak with me but slow to answer. High flow nasal canula is weaned further to 35/35.  Plan to continue with PT and chest PT utilizing metaneb with RT service.   07/06/20- Was unable to participate with PT today due to having meal.  He did participate with OT.  He is deconditioned.  Leukocytosis has improved.  I spoke with [name] (wife) and Daughter [name]).and explained that I think he is overall with poor prognosis.  I recommend hospice evaluation.   07/07/20- patient is transitioned to comfort measures. I met with family today and answered questions. im available if anything else arises but will sign off at this time. Hospice to follow Monday.    PAST MEDICAL HISTORY   Past Medical History:  Diagnosis Date  . Bilateral hearing loss 03/03/2016  . CAD (coronary artery disease) 02/24/2014  . CHF (congestive heart failure) ([place])   . COPD, moderate ([place]) 08/29/2015  . Hemorrhoids 02/24/2014  . HTN (hypertension) 02/24/2014  . Hyperlipidemia, unspecified 02/24/2014  . Nephrolithiasis   . SOB (shortness of breath) on exertion 03/27/2015  . Statin intolerance 03/30/2015     SURGICAL HISTORY   Past Surgical History:  Procedure Laterality Date  . CHOLECYSTECTOMY    . TONSILLECTOMY       FAMILY HISTORY   Family History  Problem Relation Age of Onset  . Bladder Cancer Neg Hx   . Prolactinoma Neg Hx   . Prostate cancer Neg Hx   . Kidney cancer Neg Hx      SOCIAL HISTORY   Social History   Tobacco Use  . Smoking status: Former Research scientist (life sciences)  . Smokeless tobacco: Never Used  Vaping Use  . Vaping Use: Never used  Substance Use Topics  . Alcohol use: No  . Drug use: No     MEDICATIONS    Home Medication:      Current Medication:  Current Facility-Administered Medications:  .  acetaminophen (TYLENOL) tablet 650 mg, 650 mg, Oral, Q6H PRN, 650 mg at 07/05/20 1705 **OR** acetaminophen (TYLENOL) suppository 650 mg, 650 mg, Rectal, Q6H PRN, Athena Masse, MD .  acidophilus (RISAQUAD) capsule 1 capsule, 1 capsule, Oral, Daily, Shahmehdi, Seyed A, MD, 1 capsule at 07/07/20 0953 .  apixaban (ELIQUIS) tablet 2.5 mg, 2.5 mg, Oral, BID, Judd Gaudier V, MD, 2.5 mg at 07/07/20 0954 .  diclofenac Sodium (VOLTAREN) 1 % topical gel 2 g, 2 g, Topical, BID PRN, Shahmehdi, Seyed A, MD .  diltiazem (CARDIZEM CD) 24 hr capsule 360 mg, 360 mg, Oral, Daily, Lorella Nimrod, MD, 360 mg at 07/07/20 0953 .  escitalopram (LEXAPRO) tablet 10 mg, 10 mg, Oral, Daily, Judd Gaudier V, MD, 10 mg at 07/07/20 0953 .  feeding supplement (ENSURE ENLIVE) (ENSURE ENLIVE) liquid 237 mL,  237 mL, Oral, BID BM, Lorella Nimrod, MD, 237 mL at 07/07/20 0956 .  fluticasone (FLONASE) 50 MCG/ACT nasal spray 2 spray, 2 spray, Each Nare, Daily, Athena Masse, MD, 2 spray at 07/07/20 0956 .  furosemide (LASIX) tablet 20 mg, 20 mg, Oral, Daily, Jennye Boroughs, MD, 20 mg at 07/07/20 0953 .  levalbuterol (XOPENEX) nebulizer solution 0.63 mg, 0.63 mg, Nebulization, Q6H PRN, Lang Snow, NP, 0.63 mg at 06/27/20 0845 .  magic mouthwash, 15 mL, Oral, TID, Lorella Nimrod, MD, 15 mL at 07/07/20 0956 .  metoprolol succinate (TOPROL-XL) 24 hr tablet 25 mg, 25 mg, Oral, Daily, Britney Captain, MD, 25 mg at 07/07/20 0954 .  mometasone-formoterol (DULERA) 200-5 MCG/ACT inhaler 2 puff, 2 puff, Inhalation, BID, Athena Masse, MD, 2 puff at 07/07/20 0957 .  multivitamin with minerals tablet 1 tablet, 1 tablet, Oral, Daily, Athena Masse, MD, 1 tablet at 07/07/20 0953 .  nitroGLYCERIN (NITROSTAT) SL tablet 0.4 mg, 0.4 mg, Sublingual, Q5 min PRN, Athena Masse, MD .  nystatin ointment (MYCOSTATIN), , Topical, BID, Enzo Bi, MD, Given at 07/07/20  [phone] .  ondansetron (ZOFRAN) tablet 4 mg, 4 mg, Oral, Q6H PRN **OR** ondansetron (ZOFRAN) injection 4 mg, 4 mg, Intravenous, Q6H PRN, Athena Masse, MD .  pantoprazole (PROTONIX) EC tablet 40 mg, 40 mg, Oral, Daily, Judd Gaudier V, MD, 40 mg at 07/07/20 0954 .  polyethylene glycol (MIRALAX / GLYCOLAX) packet 34 g, 34 g, Oral, BID, Enzo Bi, MD, 34 g at 07/07/20 1002 .  potassium chloride 20 MEQ/15ML (10%) solution 20 mEq, 20 mEq, Oral, Daily, Oswald Hillock, RPH, 20 mEq at 07/07/20 1002 .  predniSONE (DELTASONE) tablet 10 mg, 10 mg, Oral, Q breakfast, Jennye Boroughs, MD, 10 mg at 07/07/20 1519 .  psyllium (HYDROCIL/METAMUCIL) 1 packet, 1 packet, Oral, Daily PRN, Athena Masse, MD, 1 packet at 07/06/20 0915 .  traMADol (ULTRAM) tablet 50 mg, 50 mg, Oral, Q6H PRN, Shahmehdi, Seyed A, MD, 50 mg at 07/07/20 1519 .  traZODone (DESYREL) tablet 50 mg, 50 mg, Oral, QHS PRN, Shahmehdi, Seyed A, MD, 50 mg at 07/06/20 2009    ALLERGIES   Simvastatin     REVIEW OF SYSTEMS    Review of Systems:  Gen:  Denies  fever, sweats, chills weigh loss  HEENT: Denies blurred vision, double vision, ear pain, eye pain, hearing loss, nose bleeds, sore throat Cardiac:  No dizziness, chest pain or heaviness, chest tightness,edema Resp:   Denies cough or sputum porduction, shortness of breath,wheezing, hemoptysis,  Gi: Denies swallowing difficulty, stomach pain, nausea or vomiting, diarrhea, constipation, bowel incontinence Gu:  Denies bladder incontinence, burning urine Ext:   Denies Joint pain, stiffness or swelling Skin: Denies  skin rash, easy bruising or bleeding or hives Endoc:  Denies polyuria, polydipsia , polyphagia or weight change Psych:   Denies depression, insomnia or hallucinations   Other:  All other systems negative   VS: BP 102/80 (BP Location: Left Arm)   Pulse (!) 115   Temp 98.2 F (36.8 C) (Oral)   Resp 20   Ht _0  (1.676 m)   Wt 48.9 kg   SpO2 95%   BMI 17.38 kg/m       PHYSICAL EXAM    GENERAL:NAD, no fevers, chills, no weakness no fatigue HEAD: Normocephalic, atraumatic.  EYES: Pupils equal, round, reactive to light. Extraocular muscles intact. No scleral icterus.  MOUTH: Moist mucosal membrane. Dentition intact. No abscess noted.  EAR, NOSE,  THROAT: Clear without exudates. No external lesions.  NECK: Supple. No thyromegaly. No nodules. No JVD.  PULMONARY: mild ronchi bilaterally  CARDIOVASCULAR: S1 and S2. Regular rate and rhythm. No murmurs, rubs, or gallops. No edema. Pedal pulses 2+ bilaterally.  GASTROINTESTINAL: Soft, nontender, nondistended. No masses. Positive bowel sounds. No hepatosplenomegaly.  MUSCULOSKELETAL: No swelling, clubbing, or edema. Range of motion full in all extremities.  NEUROLOGIC: Cranial nerves II through XII are intact. No gross focal neurological deficits. Sensation intact. Reflexes intact.  SKIN: No ulceration, lesions, rashes, or cyanosis. Skin warm and dry. Turgor intact.  PSYCHIATRIC: Mood, affect within normal limits. The patient is awake, alert and oriented x 3. Insight, judgment intact.       IMAGING    DG Chest 1 View  Result Date: 06/25/2020 CLINICAL DATA:  Shortness of breath EXAM: CHEST  1 VIEW COMPARISON:  June 24, 2020 FINDINGS: The heart size and mediastinal contours are unchanged with mild cardiomegaly. Aortic knob calcifications are seen. Again noted is hyperinflation of the lung zones with flattening of the hemidiaphragms. There is mildly increased interstitial opacities seen at both lower lungs. There is new hazy airspace opacity seen at the left lung base. No acute osseous abnormality. IMPRESSION: Increased hazy airspace opacity at the left lung base which could be due to atelectasis and/or infectious etiology. Probable chronic interstitial opacities at both lungs. Electronically Signed   By: Prudencio Pair M.D.   On: 06/25/2020 03:47   DG Chest 2 View  Result Date: 06/20/2020 CLINICAL DATA:   Suspect sepsis. EXAM: CHEST - 2 VIEW COMPARISON:  03/25/2019 FINDINGS: Heart size and vascularity normal. Mild bibasilar atelectasis. Small right effusion. Elevated left hemidiaphragm with bowel gas below the left diaphragm. Mild hyperinflation lungs. No mass lesion. No acute skeletal abnormality. IMPRESSION: Mild bibasilar atelectasis and small right effusion.   COPD. Electronically Signed   By: Franchot Gallo M.D.   On: 06/09/2020 13:35   CT Head Wo Contrast  Result Date: 05/25/2020 CLINICAL DATA:  Altered mental status. EXAM: CT HEAD WITHOUT CONTRAST TECHNIQUE: Contiguous axial images were obtained from the base of the skull through the vertex without intravenous contrast. COMPARISON:  March 16, 2018 FINDINGS: Brain: There is mild cerebral atrophy with widening of the extra-axial spaces and ventricular dilatation. There are areas of decreased attenuation within the white matter tracts of the supratentorial brain, consistent with microvascular disease changes. Vascular: No hyperdense vessel or unexpected calcification. Skull: Normal. Negative for fracture or focal lesion. Sinuses/Orbits: Very mild, bilateral ethmoid sinus mucosal thickening is seen. Other: None. IMPRESSION: 1. Generalized cerebral atrophy. 2. Very mild, bilateral ethmoid sinus disease. 3. No acute intracranial abnormality. Electronically Signed   By: Virgina Norfolk M.D.   On: 05/29/2020 21:11   MR PELVIS WO CONTRAST  Result Date: 06/24/2020 CLINICAL DATA:  Possible rectal mass on CT EXAM: MRI PELVIS WITHOUT CONTRAST TECHNIQUE: Multiplanar multisequence MR imaging of the pelvis was performed. No intravenous contrast was administered. Small amount of [place] gel was administered per rectum to optimize tumor evaluation. COMPARISON:  CT abdomen/pelvis dated 05/25/2020 FINDINGS: Urinary Tract:  Bladder is within normal limits. Bowel: Distension of the rectum with ultrasound gel. No rectal mass is seen. Visualized bowel is otherwise unremarkable,  including the sigmoid colon. Vascular/Lymphatic: No evidence of aneurysm. No suspicious pelvic lymphadenopathy. Reproductive:  Suspected postsurgical changes related to prior TURP. Other:  No pelvic ascites. Musculoskeletal: Mild degenerative changes of the lower lumbar spine. IMPRESSION: No rectosigmoid mass is evident on MR. Negative pelvic MRI.  Electronically Signed   By: Julian Hy M.D.   On: 06/24/2020 12:15   MR ABDOMEN W WO CONTRAST  Result Date: 06/24/2020 CLINICAL DATA:  Inpatient. Frequent UTI. COPD and pulmonary fibrosis with chronic respiratory failure. Confusion. Concern for rectal mass on recent CT. EXAM: MRI ABDOMEN WITHOUT AND WITH CONTRAST TECHNIQUE: Multiplanar multisequence MR imaging of the abdomen was performed both before and after the administration of intravenous contrast. CONTRAST:  7.77m GADAVIST GADOBUTROL 1 MMOL/ML IV SOLN COMPARISON:  06/13/2020 CT abdomen/pelvis. FINDINGS: Lower chest: Small dependent bilateral pleural effusions, right greater than left. Hepatobiliary: Normal liver size and configuration. No hepatic steatosis. No liver mass. Cholecystectomy. Bile ducts are within normal post cholecystectomy limits. Common bile duct diameter 5 mm. No choledocholithiasis. No biliary masses, strictures or beading. Probable 12 mm diameter cystic duct remnant in the porta hepatis (series 4/image 14). Pancreas: No pancreatic mass or duct dilation.  No pancreas divisum. Spleen: Normal size. No mass. Adrenals/Urinary Tract: Normal adrenals. No hydronephrosis. Scattered simple subcentimeter renal cortical cysts in both kidneys. No suspicious renal masses. Stomach/Bowel: Normal non-distended stomach. Visualized small and large bowel is normal caliber, with no bowel wall thickening. Vascular/Lymphatic: Atherosclerotic nonaneurysmal abdominal aorta. Patent portal, splenic, hepatic and renal veins. Retroaortic left renal vein. No pathologically enlarged lymph nodes in the abdomen. Other:  No abdominal ascites or focal fluid collection. Musculoskeletal: No aggressive appearing focal osseous lesions. Mild L4 vertebral compression fracture, chronic appearing. IMPRESSION: 1. No lymphadenopathy or other findings to suggest metastatic disease in the abdomen. MRI pelvis to be reported separately. 2. Bile ducts are within normal post cholecystectomy limits (CBD diameter 5 mm). No choledocholithiasis. Probable small cystic duct remnant in the porta hepatis. 3. Small dependent bilateral pleural effusions, right greater than left. 4. Mild L4 vertebral compression fracture, chronic appearing. Electronically Signed   By: JIlona SorrelM.D.   On: 06/24/2020 07:01   CT ABDOMEN PELVIS W CONTRAST  Result Date: 06/12/2020 CLINICAL DATA:  Abdominal pain. EXAM: CT ABDOMEN AND PELVIS WITH CONTRAST TECHNIQUE: Multidetector CT imaging of the abdomen and pelvis was performed using the standard protocol following bolus administration of intravenous contrast. CONTRAST:  1063mOMNIPAQUE IOHEXOL 300 MG/ML  SOLN COMPARISON:  None. FINDINGS: Lower chest: A trace amount of atelectasis is seen within the right lung base. A very small right pleural effusion is noted. Hepatobiliary: No focal liver abnormality is seen. Status post cholecystectomy. No biliary dilatation. Pancreas: Unremarkable. No pancreatic ductal dilatation or surrounding inflammatory changes. Spleen: Normal in size without focal abnormality. Adrenals/Urinary Tract: Adrenal glands are unremarkable. Kidneys are normal, without renal calculi, focal lesion, or hydronephrosis. Bladder is unremarkable. Stomach/Bowel: Stomach is within normal limits. Appendix appears normal. No evidence of bowel dilatation. There is marked severity thickening of the rectum and adjacent portion of the distal sigmoid colon (axial CT images 71 through 75, CT series number 2). This is slightly asymmetric in appearance, right greater than left. No associated inflammatory fat stranding is  seen. Vascular/Lymphatic: There is marked severity calcification and atherosclerosis of the abdominal aorta and bilateral common iliac arteries, without evidence of aneurysmal dilatation. No enlarged abdominal or pelvic lymph nodes. Reproductive: The prostate gland is mildly enlarged. Other: No abdominal wall hernia or abnormality. No abdominopelvic ascites. Musculoskeletal: Moderate to marked severity multilevel degenerative changes seen throughout the lumbar spine. IMPRESSION: 1. Asymmetric of the rectum and adjacent portion of the sigmoid colon, which may be secondary to an underlying mass. Correlation with physical examination and MRI is recommended. 2. Evidence of  prior cholecystectomy. 3. Very small right pleural effusion. 4. Mildly enlarged prostate gland. 5. Moderate to marked severity multilevel degenerative changes throughout the lumbar spine. 6. Aortic atherosclerosis. Aortic Atherosclerosis (ICD10-I70.0). Electronically Signed   By: Virgina Norfolk M.D.   On: 06/16/2020 21:22   DG Chest Port 1 View  Result Date: 07/04/2020 CLINICAL DATA:  85 year old male with recent hypoxia. Shortness of breath. EXAM: PORTABLE CHEST 1 VIEW COMPARISON:  Portable chest 07/03/2020 and earlier. FINDINGS: Portable AP upright view at 1634 hours. Diffuse emphysema demonstrated on chest CT 03/19/2020. Associated mild bilateral basilar predominant increased interstitial markings appear stable across this series of exams. Stable lung volumes and mediastinal contours. No pneumothorax, pulmonary edema, pleural effusion or acute pulmonary opacity. No acute osseous abnormality identified. Negative visible bowel gas pattern. IMPRESSION: No acute cardiopulmonary abnormality.  Emphysema (ICD10-J43.9). Electronically Signed   By: Genevie Ann M.D.   On: 07/04/2020 16:50   DG Chest Port 1 View  Result Date: 07/03/2020 CLINICAL DATA:  Hypoxia. EXAM: PORTABLE CHEST 1 VIEW COMPARISON:  July 02, 2020. FINDINGS: The heart size and  mediastinal contours are within normal limits. No pneumothorax or pleural effusion is noted. Mild bibasilar subsegmental atelectasis is noted. The visualized skeletal structures are unremarkable. IMPRESSION: Mild bibasilar subsegmental atelectasis. Electronically Signed   By: Marijo Conception M.D.   On: 07/03/2020 18:26   DG Chest Port 1 View  Result Date: 07/02/2020 CLINICAL DATA:  Abnormal chest x-ray. EXAM: PORTABLE CHEST 1 VIEW COMPARISON:  06/27/2020 FINDINGS: Underlying COPD with hyperinflation. Superimposed bibasilar airspace disease demonstrates interval improvement from the prior study. No pleural effusion or pneumothorax. Heart size and vascularity normal. Atherosclerotic calcification aortic arch. IMPRESSION: Interval improvement in bibasilar atelectasis/infiltrate. Electronically Signed   By: Franchot Gallo M.D.   On: 07/02/2020 08:08   DG Chest Port 1 View  Result Date: 06/27/2020 CLINICAL DATA:  Altered mental status EXAM: PORTABLE CHEST 1 VIEW COMPARISON:  06/25/2020 FINDINGS: Chronic interstitial prominence. Improved aeration at the left lung base. No pleural effusion or pneumothorax. Stable cardiomediastinal contours. IMPRESSION: Improved aeration at the left lung base since 06/25/2020. No new findings. Electronically Signed   By: Macy Mis M.D.   On: 06/27/2020 08:07   DG Chest Port 1 View  Result Date: 06/24/2020 CLINICAL DATA:  Shortness of breath, pulmonary fibrosis, altered mental status EXAM: PORTABLE CHEST 1 VIEW COMPARISON:  06/06/2020 FINDINGS: Mild cardiomegaly. Emphysema. Mild, diffuse interstitial opacity. Bibasilar fibrotic scarring. The visualized skeletal structures are unremarkable. IMPRESSION: Emphysema and bibasilar fibrotic change with mild, diffuse interstitial opacity, potentially edema. No focal airspace opacity. Electronically Signed   By: Eddie Candle M.D.   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    Patient/Family are satisfied with care plan and all questions have been answered.  This document was prepared using Dragon voice recognition software and may include unintentional dictation errors.     Ottie Glazier, M.D.  Division of [facility]

## 2020-07-08 MED ORDER — HYDROCORTISONE ACETATE 25 MG RE SUPP
25.0000 mg | Freq: Two times a day (BID) | RECTAL | Status: DC
  Administered 2020-07-08 – 2020-07-09 (×2): 25 mg via RECTAL
  Filled 2020-07-08 (×7): qty 1

## 2020-07-08 NOTE — Progress Notes (Signed)
Progress Note    ANKIT DEGREGORIO  DUK:[id number] DOB: [birth year]  DOA: 06/05/2020 PCP: [name], MD      Brief Narrative:    Medical records reviewed and are as summarized below:  Eric Li is a 85 y.o. male  with history of paroxysmal A. fib on apixaban, HTN, CAD, frequent UTIs, COPD and pulmonary fibrosis with chronic respiratory failure on home O2 at 3 L.  He was brought to the hospital because of altered mental status.  He was found to have acute UTI and altered mental status.  To be due to acute metabolic encephalopathy.  He was treated with IV ceftriaxone.  He developed acute on chronic hypoxemic respiratory failure secondary to exacerbation of pulmonary fibrosis.  He was treated with oxygen via heated high flow nasal cannula.      Assessment/Plan:   Principal Problem:   Acute metabolic encephalopathy Active Problems:   CAD (coronary artery disease)   HTN (hypertension)   UTI (urinary tract infection)   PAF (paroxysmal atrial fibrillation) (HCC)   Pulmonary fibrosis (HCC)   Acute on chronic respiratory failure with hypoxia (HCC)   COPD, severe (HCC)   Abnormal computed tomography angiography (CTA) of abdomen and pelvis   Goals of care, counseling/discussion   Palliative care by specialist   DNR (do not resuscitate)   Protein-calorie malnutrition, severe Underlying cognitive impairment/ ?  Dementia Probable acute exacerbation of chronic diastolic CHF Hypokalemia  Nutrition Problem: Severe Malnutrition Etiology: chronic illness (COPD, pulmonary fibrosis)  Signs/Symptoms: severe fat depletion, severe muscle depletion   Body mass index is 16.96 kg/m.    PLAN  He remains on 10 L/min oxygen via high flow nasal cannula.  Continue comfort measures and home medications.  Anusol suppository has been prescribed hemorrhoids per daughter's request.  Awaiting hospice consult for potential discharge to home with hospice  Plan discussed with  his daughter at the bedside   Diet Order            DIET - DYS 1 Room service appropriate? Yes with Assist; Fluid consistency: Thin  Diet effective now                    Consultants:  Pulmonologist  Procedures:  None    Medications:   . acidophilus  1 capsule Oral Daily  . apixaban  2.5 mg Oral BID  . diltiazem  360 mg Oral Daily  . escitalopram  10 mg Oral Daily  . feeding supplement  237 mL Oral BID BM  . fluticasone  2 spray Each Nare Daily  . furosemide  20 mg Oral Daily  . hydrocortisone  25 mg Rectal BID  . magic mouthwash  15 mL Oral TID  . metoprolol succinate  25 mg Oral Daily  . mometasone-formoterol  2 puff Inhalation BID  . multivitamin with minerals  1 tablet Oral Daily  . nystatin ointment   Topical BID  . pantoprazole  40 mg Oral Daily  . polyethylene glycol  34 g Oral BID  . potassium chloride  20 mEq Oral Daily  . predniSONE  10 mg Oral Q breakfast   Continuous Infusions:   Anti-infectives (From admission, onward)   Start     Dose/Rate Route Frequency Ordered Stop   06/26/20 2200  cefTRIAXone (ROCEPHIN) 1 g in sodium chloride 0.9 % 100 mL IVPB        1 g 200 mL/hr over 30 Minutes Intravenous Every 24 hours 06/26/20 1137 06/26/20  2237   06/22/20 2200  cefTRIAXone (ROCEPHIN) 1 g in sodium chloride 0.9 % 100 mL IVPB  Status:  Discontinued        1 g 200 mL/hr over 30 Minutes Intravenous Every 24 hours 05/30/2020 2248 06/26/20 1137   06/10/2020 2145  cefTRIAXone (ROCEPHIN) 2 g in sodium chloride 0.9 % 100 mL IVPB        2 g 200 mL/hr over 30 Minutes Intravenous  Once 05/23/2020 2142 06/01/2020 2249             Family Communication/Anticipated D/C date and plan/Code Status   DVT prophylaxis: apixaban (ELIQUIS) tablet 2.5 mg Start: 06/02/2020 2245 apixaban (ELIQUIS) tablet 2.5 mg     Code Status: DNR  Family Communication: Plan discussed with her daughter, [name] at the bedside Disposition Plan:    Status is: Inpatient  Remains  inpatient appropriate because:Unsafe d/c plan and Severe hypoxia requiring high flow nasal cannula   Dispo:  Patient From: Home  Planned Disposition: Home with hospice  Expected discharge date: 07/09/20  Medically stable for discharge: Yes            Subjective:   Patient complains of rectal pain.  His daughter is at the bedside and attributes this to hemorrhoids.  She requested Anusol suppository for her dad.  Objective:    Vitals:   07/07/20 1200 07/07/20 2141 07/08/20 0516 07/08/20 0800  BP: 102/80 104/80 97/73 95/74   Pulse: (!) 115 (!) 109 (!) 110 (!) 112  Resp: 20 20 18 14   Temp: 98.2 F (36.8 C) (!) 97.5 F (36.4 C)  97.7 F (36.5 C)  TempSrc: Oral Oral  Oral  SpO2: 95%  96% 97%  Weight:   47.7 kg   Height:       No data found.   Intake/Output Summary (Last 24 hours) at 07/08/2020 1341 Last data filed at 07/08/2020 0526 Gross per 24 hour  Intake --  Output 1200 ml  Net -1200 ml   Filed Weights   07/06/20 0344 07/07/20 0424 07/08/20 0516  Weight: 50.7 kg 48.9 kg 47.7 kg    Exam:  GEN: NAD SKIN: Warm and dry EYES: No pallor or icterus ENT: MMM CV: RRR PULM: CTA B ABD: soft, ND, NT, +BS CNS: AAO x 3, non focal EXT: No edema or tenderness      Data Reviewed:   I have personally reviewed following labs and imaging studies:  Labs: Labs show the following:   Basic Metabolic Panel: Recent Labs  Lab 07/02/20 0508 07/02/20 0508 07/03/20 0415 07/03/20 0415 07/05/20 0540 07/06/20 0415  NA 138  --  136  --  135 133*  K 4.0   < > 4.5   < > 3.7 3.4*  CL 99  --  97*  --  94* 93*  CO2 31  --  27  --  30 28  GLUCOSE 114*  --  221*  --  130* 162*  BUN 37*  --  29*  --  30* 31*  CREATININE 0.88  --  0.86  --  0.99 0.86  CALCIUM 8.9  --  8.9  --  8.7* 8.5*  MG 2.4  --  2.3  --   --  2.3  PHOS  --   --   --   --   --  2.7   < > = values in this interval not displayed.   GFR Estimated Creatinine Clearance: 43.9 mL/min (by C-G formula  based on SCr of  0.86 mg/dL). Liver Function Tests: No results for input(s): AST, ALT, ALKPHOS, BILITOT, PROT, ALBUMIN in the last 168 hours. No results for input(s): LIPASE, AMYLASE in the last 168 hours. No results for input(s): AMMONIA in the last 168 hours. Coagulation profile No results for input(s): INR, PROTIME in the last 168 hours.  CBC: Recent Labs  Lab 07/02/20 0508 07/03/20 0415 07/06/20 0415  WBC 20.0* 17.3* 15.9*  NEUTROABS  --   --  12.4*  HGB 15.4 14.9 14.9  HCT 46.2 46.1 46.5  MCV 89.0 92.6 91.7  PLT 266 245 213   Cardiac Enzymes: No results for input(s): CKTOTAL, CKMB, CKMBINDEX, TROPONINI in the last 168 hours. BNP (last 3 results) No results for input(s): PROBNP in the last 8760 hours. CBG: Recent Labs  Lab 07/06/20 1242 07/06/20 1636 07/06/20 2127 07/07/20 0800 07/07/20 1146  GLUCAP 228* 157* 121* 110* 145*   D-Dimer: No results for input(s): DDIMER in the last 72 hours. Hgb A1c: No results for input(s): HGBA1C in the last 72 hours. Lipid Profile: No results for input(s): CHOL, HDL, LDLCALC, TRIG, CHOLHDL, LDLDIRECT in the last 72 hours. Thyroid function studies: No results for input(s): TSH, T4TOTAL, T3FREE, THYROIDAB in the last 72 hours.  Invalid input(s): FREET3 Anemia work up: No results for input(s): VITAMINB12, FOLATE, FERRITIN, TIBC, IRON, RETICCTPCT in the last 72 hours. Sepsis Labs: Recent Labs  Lab 07/02/20 0508 07/03/20 0415 07/06/20 0415  WBC 20.0* 17.3* 15.9*    Microbiology No results found for this or any previous visit (from the past 240 hour(s)).  Procedures and diagnostic studies:  No results found.             LOS: 17 days   [place] Copywriter, advertising on [email or web address]. If 7PM-7AM, please contact night-coverage at [email or web address]     07/08/2020, 1:41 PM

## 2020-07-09 MED ORDER — BISACODYL 10 MG RE SUPP: 10.0000 mg | Freq: Every day | RECTAL | Status: DC | PRN

## 2020-07-09 MED ORDER — LORAZEPAM 2 MG/ML IJ SOLN: 0.5000 mg | INTRAMUSCULAR | Status: DC | PRN

## 2020-07-09 MED ORDER — GLYCOPYRROLATE 0.2 MG/ML IJ SOLN
0.2000 mg | INTRAMUSCULAR | Status: DC | PRN
  Filled 2020-07-09: qty 1

## 2020-07-09 MED ORDER — POLYETHYLENE GLYCOL 3350 17 G PO PACK: 34.0000 g | PACK | Freq: Every day | ORAL | Status: DC

## 2020-07-09 MED ORDER — MORPHINE SULFATE (PF) 2 MG/ML IV SOLN: 2.0000 mg | INTRAVENOUS | Status: DC | PRN

## 2020-07-09 MED ORDER — MORPHINE SULFATE 10 MG/5ML PO SOLN
5.0000 mg | ORAL | Status: DC | PRN
  Administered 2020-07-09 (×3): 5 mg via ORAL
  Filled 2020-07-09: qty 5
  Filled 2020-07-09: qty 4
  Filled 2020-07-09 (×2): qty 5
  Filled 2020-07-09: qty 4

## 2020-07-09 MED ORDER — MORPHINE SULFATE (CONCENTRATE) 10 MG/0.5ML PO SOLN
5.0000 mg | ORAL | Status: DC | PRN
  Administered 2020-07-09: 10 mg via SUBLINGUAL

## 2020-07-09 MED ORDER — ALPRAZOLAM 0.25 MG PO TABS: 0.2500 mg | ORAL_TABLET | Freq: Two times a day (BID) | ORAL | Status: DC | PRN

## 2020-07-09 MED ORDER — LORAZEPAM 0.5 MG PO TABS
0.5000 mg | ORAL_TABLET | ORAL | Status: DC | PRN
  Administered 2020-07-09: 1 mg via ORAL
  Filled 2020-07-09: qty 2

## 2020-07-09 MED ORDER — ENSURE ENLIVE PO LIQD: 237.0000 mL | Freq: Two times a day (BID) | ORAL | Status: DC | PRN

## 2020-07-09 MED ORDER — LORAZEPAM 2 MG/ML PO CONC
0.5000 mg | ORAL | Status: DC | PRN
  Administered 2020-07-09: 1 mg via SUBLINGUAL
  Filled 2020-07-09: qty 1

## 2020-07-09 MED ORDER — POLYVINYL ALCOHOL 1.4 % OP SOLN
1.0000 [drp] | Freq: Four times a day (QID) | OPHTHALMIC | Status: DC | PRN
  Filled 2020-07-09: qty 15

## 2020-07-09 MED ORDER — FLUTICASONE PROPIONATE 50 MCG/ACT NA SUSP
2.0000 | Freq: Two times a day (BID) | NASAL | Status: DC | PRN
  Filled 2020-07-09: qty 16

## 2020-07-09 MED ORDER — BIOTENE DRY MOUTH MT LIQD: 15.0000 mL | OROMUCOSAL | Status: DC | PRN

## 2020-07-09 MED ORDER — MORPHINE SULFATE (CONCENTRATE) 10 MG/0.5ML PO SOLN
5.0000 mg | ORAL | Status: DC | PRN
  Administered 2020-07-09: 10 mg via ORAL
  Filled 2020-07-09 (×2): qty 0.5

## 2020-07-09 NOTE — Progress Notes (Signed)
[facility] hospital Liaison note:  New referral for home with hospice received from [place].  Writer met with patient's daughter [name] and wife Nyoka Cowden, they have requested the hospice home. Patient information sent to referral. Hospice home eligibility under review.  Family is requesting that oral medications continue. They also expressed concern regarding patient's continued pain and anxiety. His IV is no longer in place and he is receiving oral morphine. Daughter [name] also feels patient is having more anxiety. Pain reported to staff RN Amy, PRN medication to be given. Request for anxiety medication shared with attending physician Dr. Mal Misty.  Family and hospital care team made aware that [facility] currenlty has no bed availability. Will continue to follow and update family and hospital care team daily regarding bed availability. Thank you for the opportunity to be involved in the care of this patient and his family.  Flo Shanks BSN, RN, [place] [phone]

## 2020-07-09 NOTE — Care Management Important Message (Signed)
Important Message  Patient Details  Name: [name] MRN: [id number] Date of Birth: [birth year]   Medicare Important Message Given:  Yes     [name] 07/09/2020, 3:27 PM

## 2020-07-09 NOTE — Progress Notes (Signed)
Progress Note    Eric Li  LNL:[id number] DOB: 06-09-1935  DOA: 06/04/2020 PCP: [name], MD      Brief Narrative:    Medical records reviewed and are as summarized below:  Eric Li is a 85 y.o. male  with history of paroxysmal A. fib on apixaban, HTN, CAD, frequent UTIs, COPD and pulmonary fibrosis with chronic respiratory failure on home O2 at 3 L.  He was brought to the hospital because of altered mental status.  He was found to have acute UTI and altered mental status.  To be due to acute metabolic encephalopathy.  He was treated with IV ceftriaxone.  He developed acute on chronic hypoxemic respiratory failure secondary to exacerbation of pulmonary fibrosis.  He was treated with oxygen via heated high flow nasal cannula.      Assessment/Plan:   Principal Problem:   Acute metabolic encephalopathy Active Problems:   CAD (coronary artery disease)   HTN (hypertension)   UTI (urinary tract infection)   PAF (paroxysmal atrial fibrillation) (HCC)   Pulmonary fibrosis (HCC)   Acute on chronic respiratory failure with hypoxia (HCC)   COPD, severe (HCC)   Abnormal computed tomography angiography (CTA) of abdomen and pelvis   Goals of care, counseling/discussion   Palliative care by specialist   DNR (do not resuscitate)   Protein-calorie malnutrition, severe Underlying cognitive impairment/ ?  Dementia Probable acute exacerbation of chronic diastolic CHF Hypokalemia  Nutrition Problem: Severe Malnutrition Etiology: chronic illness (COPD, pulmonary fibrosis)  Signs/Symptoms: severe fat depletion, severe muscle depletion   Body mass index is 16.96 kg/m.    PLAN  He still requiring 10 L/min oxygen via HFNC.  Continue comfort measures and home medications.  Patient has been evaluated by the hospice nurse and he is deemed to be a candidate for comfort care at the hospice house.  However, beds are not available at this time.  Plan of care  was discussed with patient and his daughter at the bedside.    Diet Order            DIET - DYS 1 Room service appropriate? Yes with Assist; Fluid consistency: Thin  Diet effective now                    Consultants:  Pulmonologist  Procedures:  None    Medications:   . diltiazem  360 mg Oral Daily  . hydrocortisone  25 mg Rectal BID  . magic mouthwash  15 mL Oral TID  . metoprolol succinate  25 mg Oral Daily  . nystatin ointment   Topical BID  . pantoprazole  40 mg Oral Daily  . [START ON 08/03/20] polyethylene glycol  34 g Oral Daily   Continuous Infusions:   Anti-infectives (From admission, onward)   Start     Dose/Rate Route Frequency Ordered Stop   06/26/20 2200  cefTRIAXone (ROCEPHIN) 1 g in sodium chloride 0.9 % 100 mL IVPB        1 g 200 mL/hr over 30 Minutes Intravenous Every 24 hours 06/26/20 1137 06/26/20 2237   06/22/20 2200  cefTRIAXone (ROCEPHIN) 1 g in sodium chloride 0.9 % 100 mL IVPB  Status:  Discontinued        1 g 200 mL/hr over 30 Minutes Intravenous Every 24 hours 05/27/2020 2248 06/26/20 1137   06/02/2020 2145  cefTRIAXone (ROCEPHIN) 2 g in sodium chloride 0.9 % 100 mL IVPB        2  g 200 mL/hr over 30 Minutes Intravenous  Once 06/12/2020 2142 06/14/2020 2249             Family Communication/Anticipated D/C date and plan/Code Status   DVT prophylaxis:      Code Status: DNR  Family Communication: Plan discussed with her daughter at the bedside Disposition Plan:    Status is: Inpatient  Remains inpatient appropriate because:Unsafe d/c plan and Severe hypoxia requiring high flow nasal cannula   Dispo:  Patient From: Home  Planned Disposition: Home with hospice  Expected discharge date: 07/09/20  Medically stable for discharge: Yes            Subjective:   Interval events noted.  Patient still requiring high amount of oxygen.  Her other daughter is at the bedside.  Objective:    Vitals:   07/08/20 1944  07/08/20 2000 07/09/20 0736 07/09/20 0758  BP: 105/73   112/88  Pulse: (!) 110   100  Resp: 20   16  Temp: (!) 97.5 F (36.4 C)   97.9 F (36.6 C)  TempSrc: Oral   Axillary  SpO2:  93% 96% 97%  Weight:      Height:       No data found.   Intake/Output Summary (Last 24 hours) at 07/09/2020 1529 Last data filed at 07/09/2020 1340 Gross per 24 hour  Intake 0 ml  Output 550 ml  Net -550 ml   Filed Weights   07/06/20 0344 07/07/20 0424 07/08/20 0516  Weight: 50.7 kg 48.9 kg 47.7 kg    Exam:  GEN: NAD SKIN: Warm and dry EYES: No pallor or icterus ENT: MMM CV: RRR PULM: CTA B ABD: soft, ND, NT, +BS CNS: AAO x 3, non focal EXT: No edema or tenderness      Data Reviewed:   I have personally reviewed following labs and imaging studies:  Labs: Labs show the following:   Basic Metabolic Panel: Recent Labs  Lab 07/03/20 0415 07/03/20 0415 07/05/20 0540 07/06/20 0415  NA 136  --  135 133*  K 4.5   < > 3.7 3.4*  CL 97*  --  94* 93*  CO2 27  --  30 28  GLUCOSE 221*  --  130* 162*  BUN 29*  --  30* 31*  CREATININE 0.86  --  0.99 0.86  CALCIUM 8.9  --  8.7* 8.5*  MG 2.3  --   --  2.3  PHOS  --   --   --  2.7   < > = values in this interval not displayed.   GFR Estimated Creatinine Clearance: 43.9 mL/min (by C-G formula based on SCr of 0.86 mg/dL). Liver Function Tests: No results for input(s): AST, ALT, ALKPHOS, BILITOT, PROT, ALBUMIN in the last 168 hours. No results for input(s): LIPASE, AMYLASE in the last 168 hours. No results for input(s): AMMONIA in the last 168 hours. Coagulation profile No results for input(s): INR, PROTIME in the last 168 hours.  CBC: Recent Labs  Lab 07/03/20 0415 07/06/20 0415  WBC 17.3* 15.9*  NEUTROABS  --  12.4*  HGB 14.9 14.9  HCT 46.1 46.5  MCV 92.6 91.7  PLT 245 213   Cardiac Enzymes: No results for input(s): CKTOTAL, CKMB, CKMBINDEX, TROPONINI in the last 168 hours. BNP (last 3 results) No results for  input(s): PROBNP in the last 8760 hours. CBG: Recent Labs  Lab 07/06/20 1242 07/06/20 1636 07/06/20 2127 07/07/20 0800 07/07/20 1146  GLUCAP 228* 157* 121*  110* 145*   D-Dimer: No results for input(s): DDIMER in the last 72 hours. Hgb A1c: No results for input(s): HGBA1C in the last 72 hours. Lipid Profile: No results for input(s): CHOL, HDL, LDLCALC, TRIG, CHOLHDL, LDLDIRECT in the last 72 hours. Thyroid function studies: No results for input(s): TSH, T4TOTAL, T3FREE, THYROIDAB in the last 72 hours.  Invalid input(s): FREET3 Anemia work up: No results for input(s): VITAMINB12, FOLATE, FERRITIN, TIBC, IRON, RETICCTPCT in the last 72 hours. Sepsis Labs: Recent Labs  Lab 07/03/20 0415 07/06/20 0415  WBC 17.3* 15.9*    Microbiology No results found for this or any previous visit (from the past 240 hour(s)).  Procedures and diagnostic studies:  No results found.             LOS: 18 days   [place] Copywriter, advertising on [email or web address]. If 7PM-7AM, please contact night-coverage at [email or web address]     07/09/2020, 3:29 PM

## 2020-07-09 NOTE — Progress Notes (Signed)
Daily Progress Note   Patient Name: Eric Li       Date: 07/09/2020 DOB: 1935/05/16  Age: 85 y.o. MRN#: [id number] Attending Physician: Jennye Boroughs, MD Primary Care Physician: [name], MD Admit Date: 06/17/2020  Reason for Consultation/Follow-up: Establishing goals of care, Non pain symptom management, Pain control and Terminal Care  Subjective: Patient resting comfortably in bed. Will open eyes and respond to verbal stimuli. Complains of sacral pain. No acute distress noted. 10LHFNC.   Family is at the bedside (daughter, wife, and granddaughter). Patient's care now to focus on comfort and family is requesting residential hospice placement. Santiago Glad, RN [facility]) has spoken with family and introduced Hospice home philosophy and referral process.   I discussed at length what comfort care measures would look like for patient while hospitalized. Education provided on symptom management such as pain, dyspnea, agitation, and secretions. Family verbalized understanding and confirmed wishes for care to focus on full comfort.   Education provided on medications used to provide effective relief of symptoms. Patient does not have IV access and family originally did not want to put patient through the process of further sticks. We discussed the use of oral medications to assist with symptom management. Family verbalized understanding expressing they would not want him to be as uncomfortable as he appeared to be earlier including some agitation. Discussed re-inserting another IV if symptoms did not seem to be managed with oral/Sublingual medications. Family verbalized understanding and agreement.   I reviewed patient's current medications with family discussing each and how they would correlate with the expressed goals for comfort. Medications not focused on comfort/symptom management were discontinued. Family would like to continue with patient's cardiac medications to  alleviate any possible complications of heart arrhythmias. Explained the appropriateness of their request with understanding if he becomes unable to take medications or refuses it will not be mandated to receive. Also provided education on weaning patient off of HFNC and the use of his oxygen down to at minimum 2L/[place]. Family verbalized understanding and would like to make sure all symptoms are managed prior to doing this. Daughter is planning on staying with patient the next 24hrs and would feel more comfortable weaning during the night or first thing tomorrow. Discussed the use of PRN medications to control any air hunger or anxiety during this process. Family verbalized agreement.   All questions answered and support provided.    Length of Stay: 18 days  Vital Signs: BP 112/88 (BP Location: Right Arm)   Pulse 100   Temp 97.9 F (36.6 C) (Axillary)   Resp 16   Ht 5\' 6"  (1.676 m)   Wt 47.7 kg   SpO2 97%   BMI 16.96 kg/m  SpO2: SpO2: 97 % O2 Device: O2 Device: High Flow Nasal Cannula O2 Flow Rate: O2 Flow Rate (L/min): 10 L/min  Physical Exam: Resting, easily aroused with verbal stimuli, frail, ill-appearing Normal breathing pattern, 10L HFNC              Palliative Care Assessment & Plan  HPI: Per Shae, NP: 85 y.o. male  with past medical history of a fib, HTN, CAD, COPD, and pulmonary fibrosis on 2-3 L oxygen at home admitted on 06/20/2020 with AMS. Found to have UTI - recurrent - treated with IV antibiotics. Also with increased oxygen requirement - now on HFNC. PMT consulted to discuss [name].   Code Status:  DNR  Goals of Care/Recommendations:  Transition all care to full comfort as requested  by family  TOC aware of residential hospice placement request Santiago Glad, RN [facility] Liaison) has spoken with family. No beds available.   D/C medications and interventions not focused on comfort  Discussed at length what comfort care will look like for medication, reviewed medications with  family. Will d/c all meds not focused on comfort. Will continue Cardiac meds for comfort as requested by family.  Morphine PRN for pain/air hunger/comfort Robinul PRN for excessive secretions Ativan PRN for agitation/anxiety Zofran PRN for nausea Liquifilm tears PRN for dry eyes May have comfort feeding Comfort cart for family Unrestricted visitations in the setting of EOL (per policy) Oxygen PRN 2L or less for comfort. No escalation. Will wean over the next 24 hrs Oral PRNs however if patient's symptoms are not effectively managed RN aware to re-insert an IV and administer IV medications as needed  PMT will continue to support and follow   Prognosis: days-weeks  Discharge Planning: Hospice facility  Thank you for allowing the Palliative Medicine Team to assist in the care of this patient.  Time Total: 50 min.   Visit consisted of counseling and education dealing with the complex and emotionally intense issues of symptom management and palliative care in the setting of serious and potentially life-threatening illness.Greater than 50%  of this time was spent counseling and coordinating care related to the above assessment and plan.  Alda Lea, AGPCNP-BC  Palliative Medicine Team [phone]

## 2020-07-09 NOTE — Progress Notes (Signed)
Loss of IV access.  Request for oral pain medication from MD.  Awaiting orders.

## 2020-07-10 MED ORDER — SCOPOLAMINE 1 MG/3DAYS TD PT72
1.0000 | MEDICATED_PATCH | TRANSDERMAL | Status: DC
  Administered 2020-07-10: 1.5 mg via TRANSDERMAL
  Filled 2020-07-10: qty 1

## 2020-07-23 NOTE — Progress Notes (Addendum)
[facility] Liaison note:  Visit made to check in on patient and family. Patient only minimally responsive, oxygen is being weaned from 10 liters very gradually. Emotional support given.  Family and hospital care team aware that [facility] does not have bed availability today. Will continue to follow and update daily regarding bed availability.  Flo Shanks BSN RN, [place] [phone]

## 2020-07-23 NOTE — Progress Notes (Signed)
Nutrition Brief Follow-Up Note  Chart reviewed. Patient has transitioned to comfort care.   No further nutrition interventions warranted at this time. Please consult RD as needed.   Beatrice Sehgal King, MS, RD, LDN Pager number available on Amion 

## 2020-07-23 NOTE — Progress Notes (Signed)
Called [employer]. Patient ruled out for all donor services, per [facility]. Reference [phone].

## 2020-07-23 NOTE — Progress Notes (Addendum)
Progress Note    [name]  URK:[id number] DOB: 17-Feb-1935  DOA: 06/07/2020 PCP: [name], MD      Brief Narrative:    Medical records reviewed and are as summarized below:  Eric Li is a 85 y.o. male  with history of paroxysmal A. fib on apixaban, chronic diastolic CHF, HTN, CAD, frequent UTIs, COPD and pulmonary fibrosis with chronic respiratory failure on home O2 at 3 L.  He was brought to the hospital because of altered mental status.  He was found to have acute UTI and altered mental status suspected to be due to acute metabolic encephalopathy.  He was treated with IV ceftriaxone.  He developed acute on chronic hypoxemic respiratory failure secondary to acute exacerbation of pulmonary fibrosis.  He was treated with steroids and oxygen via heated high flow nasal cannula.  Pulmonologist was consulted to assist with management.  However, attempts to wean him off of oxygen by HFNC proved futile.  He is requiring as high as 10 L/min oxygen via high flow nasal collar.  Patient and family opted for comfort measures.  Hospice team was consulted and patient is deemed to be a candidate for discharge to the hospice house.      Assessment/Plan:   Principal Problem:   Acute metabolic encephalopathy Active Problems:   CAD (coronary artery disease)   HTN (hypertension)   UTI (urinary tract infection)   PAF (paroxysmal atrial fibrillation) (HCC)   Pulmonary fibrosis (HCC)   Acute on chronic respiratory failure with hypoxia (HCC)   COPD, severe (HCC)   Abnormal computed tomography angiography (CTA) of abdomen and pelvis   Goals of care, counseling/discussion   Palliative care by specialist   DNR (do not resuscitate)   Protein-calorie malnutrition, severe Underlying cognitive impairment/ ?  Dementia Probable acute exacerbation of chronic diastolic CHF Hypokalemia  Nutrition Problem: Severe Malnutrition Etiology: chronic illness (COPD, pulmonary  fibrosis)  Signs/Symptoms: severe fat depletion, severe muscle depletion   Body mass index is 16.96 kg/m.    PLAN  He is on 2 L/min oxygen via 4 L/min via high flow nasal cannula.  Continue comfort measures.  Family wants to continue chronic home medications.  Awaiting placement to hospice house.  Plan of care was discussed with Eric Li, his daughter, at the bedside.    Diet Order            DIET - DYS 1 Room service appropriate? Yes with Assist; Fluid consistency: Thin  Diet effective now                    Consultants:  Pulmonologist  Procedures:  None    Medications:   . diltiazem  360 mg Oral Daily  . hydrocortisone  25 mg Rectal BID  . magic mouthwash  15 mL Oral TID  . metoprolol succinate  25 mg Oral Daily  . nystatin ointment   Topical BID  . pantoprazole  40 mg Oral Daily  . polyethylene glycol  34 g Oral Daily  . scopolamine  1 patch Transdermal Q72H   Continuous Infusions:   Anti-infectives (From admission, onward)   Start     Dose/Rate Route Frequency Ordered Stop   06/26/20 2200  cefTRIAXone (ROCEPHIN) 1 g in sodium chloride 0.9 % 100 mL IVPB        1 g 200 mL/hr over 30 Minutes Intravenous Every 24 hours 06/26/20 1137 06/26/20 2237   06/22/20 2200  cefTRIAXone (ROCEPHIN) 1 g in sodium  chloride 0.9 % 100 mL IVPB  Status:  Discontinued        1 g 200 mL/hr over 30 Minutes Intravenous Every 24 hours 06/13/2020 2248 06/26/20 1137   06/20/2020 2145  cefTRIAXone (ROCEPHIN) 2 g in sodium chloride 0.9 % 100 mL IVPB        2 g 200 mL/hr over 30 Minutes Intravenous  Once 05/31/2020 2142 06/12/2020 2249             Family Communication/Anticipated D/C date and plan/Code Status   DVT prophylaxis:      Code Status: DNR  Family Communication: Plan discussed with her daughter at the bedside Disposition Plan:    Status is: Inpatient  Remains inpatient appropriate because:Unsafe d/c plan and Severe hypoxia requiring high flow nasal  cannula   Dispo:  Patient From: Home  Planned Disposition: Home with hospice  Expected discharge date: 07/09/20  Medically stable for discharge: Yes            Subjective:   Patient was sleepy/lethargic during my visit.  His daughter, Eric Li, said patient had been sleepy since last night and had barely woken up.  Objective:    Vitals:   07/08/20 1944 07/08/20 2000 07/09/20 0736 07/09/20 0758  BP: 105/73   112/88  Pulse: (!) 110   100  Resp: 20   16  Temp: (!) 97.5 F (36.4 C)   97.9 F (36.6 C)  TempSrc: Oral   Axillary  SpO2:  93% 96% 97%  Weight:      Height:       No data found.   Intake/Output Summary (Last 24 hours) at 17-Jul-2020 1405 Last data filed at 07/17/20 0517 Gross per 24 hour  Intake --  Output 630 ml  Net -630 ml   Filed Weights   07/06/20 0344 07/07/20 0424 07/08/20 0516  Weight: 50.7 kg 48.9 kg 47.7 kg    Exam:  GEN: NAD SKIN: Warm and dry EYES: No acute abnormality noted ENT: MMM CV: RRR PULM: No wheezing or rales heard ABD: Soft, nondistended CNS: Sleepy/lethargic EXT: No edema         Data Reviewed:   I have personally reviewed following labs and imaging studies:  Labs: Labs show the following:   Basic Metabolic Panel: Recent Labs  Lab 07/05/20 0540 07/06/20 0415  NA 135 133*  K 3.7 3.4*  CL 94* 93*  CO2 30 28  GLUCOSE 130* 162*  BUN 30* 31*  CREATININE 0.99 0.86  CALCIUM 8.7* 8.5*  MG  --  2.3  PHOS  --  2.7   GFR Estimated Creatinine Clearance: 43.9 mL/min (by C-G formula based on SCr of 0.86 mg/dL). Liver Function Tests: No results for input(s): AST, ALT, ALKPHOS, BILITOT, PROT, ALBUMIN in the last 168 hours. No results for input(s): LIPASE, AMYLASE in the last 168 hours. No results for input(s): AMMONIA in the last 168 hours. Coagulation profile No results for input(s): INR, PROTIME in the last 168 hours.  CBC: Recent Labs  Lab 07/06/20 0415  WBC 15.9*  NEUTROABS 12.4*  HGB 14.9   HCT 46.5  MCV 91.7  PLT 213   Cardiac Enzymes: No results for input(s): CKTOTAL, CKMB, CKMBINDEX, TROPONINI in the last 168 hours. BNP (last 3 results) No results for input(s): PROBNP in the last 8760 hours. CBG: Recent Labs  Lab 07/06/20 1242 07/06/20 1636 07/06/20 2127 07/07/20 0800 07/07/20 1146  GLUCAP 228* 157* 121* 110* 145*   D-Dimer: No results for input(s): DDIMER  in the last 72 hours. Hgb A1c: No results for input(s): HGBA1C in the last 72 hours. Lipid Profile: No results for input(s): CHOL, HDL, LDLCALC, TRIG, CHOLHDL, LDLDIRECT in the last 72 hours. Thyroid function studies: No results for input(s): TSH, T4TOTAL, T3FREE, THYROIDAB in the last 72 hours.  Invalid input(s): FREET3 Anemia work up: No results for input(s): VITAMINB12, FOLATE, FERRITIN, TIBC, IRON, RETICCTPCT in the last 72 hours. Sepsis Labs: Recent Labs  Lab 07/06/20 0415  WBC 15.9*    Microbiology No results found for this or any previous visit (from the past 240 hour(s)).  Procedures and diagnostic studies:  No results found.             LOS: 19 days   Nadine Copywriter, advertising on [email or web address]. If 7PM-7AM, please contact night-coverage at [email or web address]     2020-07-28, 2:05 PM

## 2020-07-23 NOTE — Progress Notes (Signed)
Daily Progress Note   Patient Name: Eric Li       Date: 2020/07/26 DOB: 21-Jan-1935  Age: 85 y.o. MRN#: [id number] Attending Physician: Jennye Boroughs, MD Primary Care Physician: [name], MD Admit Date: 06/18/2020  Reason for Consultation/Follow-up: Non pain symptom management, Pain control and Psychosocial/spiritual support  Subjective: Patient unresponsive. Will open eyes intermittently but quickly closes them. No correlation to verbal stimuli or to touch. Mouth breathing.  Some audible congestion noted.  Appears comfortable in no acute distress. Continues on HFNC.   Wife and granddaughter is at the bedside. I discussed at length with family the goal of comfort with emphasis on weaning patient off of HFNC. Wife tearful expressing her thoughts of discontinuing oxygen would further expedite patient's death. Education provided on the use of HFNC which is considered an aggressive medical intervention versus the use of nasal cannula with a focus on comfort. Family has a home pulse oximeter at the bedside and granddaughter states they have been measuring patient's oxygen levels and heart rate. We discussed at length not focusing on the numbers but spending time expressing their love and focusing on his comfort. Education provided on nursing staff only monitoring vitals once a day (generally first thing in the morning) and many times not at all. Family verbalizes understanding and agreement. They understand that regardless of the reading no additional interventions would be required in the setting of full comfort as they requested.   Family is aware if patient was to transfer to residential hospice he would not continue on high flow oxygen. I again re-emphasized the need to wean while also being sensitive to their thought process and emotions. Both granddaughter and wife verbalized their agreement of weaning down oxygen requirements with a goal of transitioning to a nasal cannula once he  is successfully down to at least 4 L on high flow.  I again reeducated family on the use of prescribed as needed medications to assist with any symptoms such as air hunger.  Patient currently on 10 L HF [place] with family's approval I have turned oxygen down to 9 L.  They are aware that the nurse will continue to titrate down by 1 L every 1-2 hours at max.  Family is aware there is not currently a bed available at residential hospice and given patient's unresponsiveness compared to yesterday patient most likely may pass away while hospitalized.  They verbalized understanding and appreciation.  All questions answered and support provided.  Length of Stay: 19 days  Vital Signs: BP 112/88 (BP Location: Right Arm)   Pulse 100   Temp 97.9 F (36.6 C) (Axillary)   Resp 16   Ht 5\' 6"  (1.676 m)   Wt 47.7 kg   SpO2 97%   BMI 16.96 kg/m  SpO2: SpO2: 97 % O2 Device: O2 Device: High Flow Nasal Cannula O2 Flow Rate: O2 Flow Rate (L/min): 10 L/min  Physical Exam: Unresponsive, chronically ill-appearing Mouth breathing, 9 L HF [place], audible congestion          Palliative Care Assessment & Plan   Code Status:  DNR  Goals of Care/Recommendations:  Continue with full comfort care measures  Family is aware there is not a bed available at residential hospice and to anticipate hospital death given changes since yesterday (unresponsiveness)  Discussed at length with family regarding weaning patient from high flow nasal cannula to 2 L nasal cannula.  Education provided again on the use of as needed medications to assist with symptom  management, the goal of focusing on how patient is feeling/comfort level, not focusing on numbers, with a plan to titrate at max every 2 hours with a goal of transitioning to 2 L nasal cannula or less.  PMT will continue to support and follow  Prognosis: Hours - Days  Discharge Planning: Hospice facility versus anticipated hospital death  Thank you for allowing the  Palliative Medicine Team to assist in the care of this patient.  Time Total: 50 min.   Visit consisted of counseling and education dealing with the complex and emotionally intense issues of symptom management and palliative care in the setting of serious and potentially life-threatening illness.Greater than 50%  of this time was spent counseling and coordinating care related to the above assessment and plan.  Alda Lea, AGPCNP-BC  Palliative Medicine Team [phone]

## 2020-07-23 NOTE — Death Summary Note (Addendum)
DEATH SUMMARY   Patient Details  Name: Eric Li MRN: [id number] DOB: 1935/01/25  Admission/Discharge Information   Admit Date:  2020-06-25  Date of Death: Date of Death: 07-14-20  Time of Death: Time of Death: Dec 16, 1942  Length of Stay: December 13, 2022  Referring Physician: [name], MD   Reason(s) for Hospitalization  Acute UTI complicated by metabolic encephalopathy  Diagnoses  Preliminary cause of death:  Secondary Diagnoses (including complications and co-morbidities):  Principal Problem:   Pulmonary fibrosis ([place]) Active Problems:   CAD (coronary artery disease)   HTN (hypertension)   UTI (urinary tract infection)   PAF (paroxysmal atrial fibrillation) (HCC)   Acute on chronic respiratory failure with hypoxia (HCC)   COPD, severe ([place])   Acute metabolic encephalopathy   Abnormal computed tomography angiography (CTA) of abdomen and pelvis   Goals of care, counseling/discussion   Palliative care by specialist   DNR (do not resuscitate)   Protein-calorie malnutrition, severe   Brief Hospital Course (including significant findings, care, treatment, and services provided and events leading to death)  Eric Li is a 85 y.o. year old male with history of paroxysmal A. fib on apixaban, chronic diastolic CHF, HTN, CAD, frequent UTIs, COPD and pulmonary fibrosis with chronic respiratory failure on home O2 at 3 L.  He was brought to the hospital because of altered mental status.  He was found to have acute UTI and altered mental status suspected to be due to acute metabolic encephalopathy.  He was treated with IV ceftriaxone.  He developed acute on chronic hypoxemic respiratory failure secondary to acute exacerbation of pulmonary fibrosis.  He was treated with steroids and oxygen via heated high flow nasal cannula.  Pulmonologist was consulted to assist with management.  However, attempts to wean him off of oxygen by HFNC proved futile.  He was requiring as high as 10 L/min  oxygen via high flow nasal canula.  Patient and family opted for comfort measures.  Hospice team was consulted and patient was deemed to be a candidate for discharge to the hospice house.  However, his condition deteriorated and he expired on 07-14-2020 at 7:44 PM.     Pertinent Labs and Studies  Significant Diagnostic Studies DG Chest 1 View  Result Date: 06/25/2020 CLINICAL DATA:  Shortness of breath EXAM: CHEST  1 VIEW COMPARISON:  June 24, 2020 FINDINGS: The heart size and mediastinal contours are unchanged with mild cardiomegaly. Aortic knob calcifications are seen. Again noted is hyperinflation of the lung zones with flattening of the hemidiaphragms. There is mildly increased interstitial opacities seen at both lower lungs. There is new hazy airspace opacity seen at the left lung base. No acute osseous abnormality. IMPRESSION: Increased hazy airspace opacity at the left lung base which could be due to atelectasis and/or infectious etiology. Probable chronic interstitial opacities at both lungs. Electronically Signed   By: Prudencio Pair M.D.   On: 06/25/2020 03:47   DG Chest 2 View  Result Date: 06-25-20 CLINICAL DATA:  Suspect sepsis. EXAM: CHEST - 2 VIEW COMPARISON:  03/25/2019 FINDINGS: Heart size and vascularity normal. Mild bibasilar atelectasis. Small right effusion. Elevated left hemidiaphragm with bowel gas below the left diaphragm. Mild hyperinflation lungs. No mass lesion. No acute skeletal abnormality. IMPRESSION: Mild bibasilar atelectasis and small right effusion.   COPD. Electronically Signed   By: Franchot Gallo M.D.   On: 2020-06-25 13:35   CT Head Wo Contrast  Result Date: 06-25-2020 CLINICAL DATA:  Altered mental status. EXAM: CT HEAD  WITHOUT CONTRAST TECHNIQUE: Contiguous axial images were obtained from the base of the skull through the vertex without intravenous contrast. COMPARISON:  March 16, 2018 FINDINGS: Brain: There is mild cerebral atrophy with widening of the  extra-axial spaces and ventricular dilatation. There are areas of decreased attenuation within the white matter tracts of the supratentorial brain, consistent with microvascular disease changes. Vascular: No hyperdense vessel or unexpected calcification. Skull: Normal. Negative for fracture or focal lesion. Sinuses/Orbits: Very mild, bilateral ethmoid sinus mucosal thickening is seen. Other: None. IMPRESSION: 1. Generalized cerebral atrophy. 2. Very mild, bilateral ethmoid sinus disease. 3. No acute intracranial abnormality. Electronically Signed   By: Virgina Norfolk M.D.   On: 06/16/2020 21:11   MR PELVIS WO CONTRAST  Result Date: 06/24/2020 CLINICAL DATA:  Possible rectal mass on CT EXAM: MRI PELVIS WITHOUT CONTRAST TECHNIQUE: Multiplanar multisequence MR imaging of the pelvis was performed. No intravenous contrast was administered. Small amount of [place] gel was administered per rectum to optimize tumor evaluation. COMPARISON:  CT abdomen/pelvis dated 05/26/2020 FINDINGS: Urinary Tract:  Bladder is within normal limits. Bowel: Distension of the rectum with ultrasound gel. No rectal mass is seen. Visualized bowel is otherwise unremarkable, including the sigmoid colon. Vascular/Lymphatic: No evidence of aneurysm. No suspicious pelvic lymphadenopathy. Reproductive:  Suspected postsurgical changes related to prior TURP. Other:  No pelvic ascites. Musculoskeletal: Mild degenerative changes of the lower lumbar spine. IMPRESSION: No rectosigmoid mass is evident on MR. Negative pelvic MRI. Electronically Signed   By: Julian Hy M.D.   On: 06/24/2020 12:15   MR ABDOMEN W WO CONTRAST  Result Date: 06/24/2020 CLINICAL DATA:  Inpatient. Frequent UTI. COPD and pulmonary fibrosis with chronic respiratory failure. Confusion. Concern for rectal mass on recent CT. EXAM: MRI ABDOMEN WITHOUT AND WITH CONTRAST TECHNIQUE: Multiplanar multisequence MR imaging of the abdomen was performed both before and after the  administration of intravenous contrast. CONTRAST:  7.84mL GADAVIST GADOBUTROL 1 MMOL/ML IV SOLN COMPARISON:  06/10/2020 CT abdomen/pelvis. FINDINGS: Lower chest: Small dependent bilateral pleural effusions, right greater than left. Hepatobiliary: Normal liver size and configuration. No hepatic steatosis. No liver mass. Cholecystectomy. Bile ducts are within normal post cholecystectomy limits. Common bile duct diameter 5 mm. No choledocholithiasis. No biliary masses, strictures or beading. Probable 12 mm diameter cystic duct remnant in the porta hepatis (series 4/image 14). Pancreas: No pancreatic mass or duct dilation.  No pancreas divisum. Spleen: Normal size. No mass. Adrenals/Urinary Tract: Normal adrenals. No hydronephrosis. Scattered simple subcentimeter renal cortical cysts in both kidneys. No suspicious renal masses. Stomach/Bowel: Normal non-distended stomach. Visualized small and large bowel is normal caliber, with no bowel wall thickening. Vascular/Lymphatic: Atherosclerotic nonaneurysmal abdominal aorta. Patent portal, splenic, hepatic and renal veins. Retroaortic left renal vein. No pathologically enlarged lymph nodes in the abdomen. Other: No abdominal ascites or focal fluid collection. Musculoskeletal: No aggressive appearing focal osseous lesions. Mild L4 vertebral compression fracture, chronic appearing. IMPRESSION: 1. No lymphadenopathy or other findings to suggest metastatic disease in the abdomen. MRI pelvis to be reported separately. 2. Bile ducts are within normal post cholecystectomy limits (CBD diameter 5 mm). No choledocholithiasis. Probable small cystic duct remnant in the porta hepatis. 3. Small dependent bilateral pleural effusions, right greater than left. 4. Mild L4 vertebral compression fracture, chronic appearing. Electronically Signed   By: Ilona Sorrel M.D.   On: 06/24/2020 07:01   CT ABDOMEN PELVIS W CONTRAST  Result Date: 06/01/2020 CLINICAL DATA:  Abdominal pain. EXAM: CT  ABDOMEN AND PELVIS WITH CONTRAST TECHNIQUE: Multidetector  CT imaging of the abdomen and pelvis was performed using the standard protocol following bolus administration of intravenous contrast. CONTRAST:  119mL OMNIPAQUE IOHEXOL 300 MG/ML  SOLN COMPARISON:  None. FINDINGS: Lower chest: A trace amount of atelectasis is seen within the right lung base. A very small right pleural effusion is noted. Hepatobiliary: No focal liver abnormality is seen. Status post cholecystectomy. No biliary dilatation. Pancreas: Unremarkable. No pancreatic ductal dilatation or surrounding inflammatory changes. Spleen: Normal in size without focal abnormality. Adrenals/Urinary Tract: Adrenal glands are unremarkable. Kidneys are normal, without renal calculi, focal lesion, or hydronephrosis. Bladder is unremarkable. Stomach/Bowel: Stomach is within normal limits. Appendix appears normal. No evidence of bowel dilatation. There is marked severity thickening of the rectum and adjacent portion of the distal sigmoid colon (axial CT images 71 through 75, CT series number 2). This is slightly asymmetric in appearance, right greater than left. No associated inflammatory fat stranding is seen. Vascular/Lymphatic: There is marked severity calcification and atherosclerosis of the abdominal aorta and bilateral common iliac arteries, without evidence of aneurysmal dilatation. No enlarged abdominal or pelvic lymph nodes. Reproductive: The prostate gland is mildly enlarged. Other: No abdominal wall hernia or abnormality. No abdominopelvic ascites. Musculoskeletal: Moderate to marked severity multilevel degenerative changes seen throughout the lumbar spine. IMPRESSION: 1. Asymmetric of the rectum and adjacent portion of the sigmoid colon, which may be secondary to an underlying mass. Correlation with physical examination and MRI is recommended. 2. Evidence of prior cholecystectomy. 3. Very small right pleural effusion. 4. Mildly enlarged prostate gland. 5.  Moderate to marked severity multilevel degenerative changes throughout the lumbar spine. 6. Aortic atherosclerosis. Aortic Atherosclerosis (ICD10-I70.0). Electronically Signed   By: Virgina Norfolk M.D.   On: 06/06/2020 21:22   DG Chest Port 1 View  Result Date: 07/04/2020 CLINICAL DATA:  85 year old male with recent hypoxia. Shortness of breath. EXAM: PORTABLE CHEST 1 VIEW COMPARISON:  Portable chest 07/03/2020 and earlier. FINDINGS: Portable AP upright view at 1634 hours. Diffuse emphysema demonstrated on chest CT 03/19/2020. Associated mild bilateral basilar predominant increased interstitial markings appear stable across this series of exams. Stable lung volumes and mediastinal contours. No pneumothorax, pulmonary edema, pleural effusion or acute pulmonary opacity. No acute osseous abnormality identified. Negative visible bowel gas pattern. IMPRESSION: No acute cardiopulmonary abnormality.  Emphysema (ICD10-J43.9). Electronically Signed   By: Genevie Ann M.D.   On: 07/04/2020 16:50   DG Chest Port 1 View  Result Date: 07/03/2020 CLINICAL DATA:  Hypoxia. EXAM: PORTABLE CHEST 1 VIEW COMPARISON:  July 02, 2020. FINDINGS: The heart size and mediastinal contours are within normal limits. No pneumothorax or pleural effusion is noted. Mild bibasilar subsegmental atelectasis is noted. The visualized skeletal structures are unremarkable. IMPRESSION: Mild bibasilar subsegmental atelectasis. Electronically Signed   By: Marijo Conception M.D.   On: 07/03/2020 18:26   DG Chest Port 1 View  Result Date: 07/02/2020 CLINICAL DATA:  Abnormal chest x-ray. EXAM: PORTABLE CHEST 1 VIEW COMPARISON:  06/27/2020 FINDINGS: Underlying COPD with hyperinflation. Superimposed bibasilar airspace disease demonstrates interval improvement from the prior study. No pleural effusion or pneumothorax. Heart size and vascularity normal. Atherosclerotic calcification aortic arch. IMPRESSION: Interval improvement in bibasilar  atelectasis/infiltrate. Electronically Signed   By: Franchot Gallo M.D.   On: 07/02/2020 08:08   DG Chest Port 1 View  Result Date: 06/27/2020 CLINICAL DATA:  Altered mental status EXAM: PORTABLE CHEST 1 VIEW COMPARISON:  06/25/2020 FINDINGS: Chronic interstitial prominence. Improved aeration at the left lung base. No pleural effusion or  pneumothorax. Stable cardiomediastinal contours. IMPRESSION: Improved aeration at the left lung base since 06/25/2020. No new findings. Electronically Signed   By: Macy Mis M.D.   On: 06/27/2020 08:07   DG Chest Port 1 View  Result Date: 06/24/2020 CLINICAL DATA:  Shortness of breath, pulmonary fibrosis, altered mental status EXAM: PORTABLE CHEST 1 VIEW COMPARISON:  06/08/2020 FINDINGS: Mild cardiomegaly. Emphysema. Mild, diffuse interstitial opacity. Bibasilar fibrotic scarring. The visualized skeletal structures are unremarkable. IMPRESSION: Emphysema and bibasilar fibrotic change with mild, diffuse interstitial opacity, potentially edema. No focal airspace opacity. Electronically Signed   By: Eddie Candle M.D.   On: 06/24/2020 09:21    Microbiology No results found for this or any previous visit (from the past 240 hour(s)).  Lab Basic Metabolic Panel: Recent Labs  Lab 07/05/20 0540 07/06/20 0415  NA 135 133*  K 3.7 3.4*  CL 94* 93*  CO2 30 28  GLUCOSE 130* 162*  BUN 30* 31*  CREATININE 0.99 0.86  CALCIUM 8.7* 8.5*  MG  --  2.3  PHOS  --  2.7   Liver Function Tests: No results for input(s): AST, ALT, ALKPHOS, BILITOT, PROT, ALBUMIN in the last 168 hours. No results for input(s): LIPASE, AMYLASE in the last 168 hours. No results for input(s): AMMONIA in the last 168 hours. CBC: Recent Labs  Lab 07/06/20 0415  WBC 15.9*  NEUTROABS 12.4*  HGB 14.9  HCT 46.5  MCV 91.7  PLT 213   Cardiac Enzymes: No results for input(s): CKTOTAL, CKMB, CKMBINDEX, TROPONINI in the last 168 hours. Sepsis Labs: Recent Labs  Lab 07/06/20 0415  WBC  15.9*      Marillyn Goren 07/11/2020, 9:57 AM

## 2020-07-23 DEATH — deceased

## 2020-10-08 ENCOUNTER — Encounter (INDEPENDENT_AMBULATORY_CARE_PROVIDER_SITE_OTHER): Payer: PPO

## 2020-10-08 ENCOUNTER — Ambulatory Visit (INDEPENDENT_AMBULATORY_CARE_PROVIDER_SITE_OTHER): Payer: PPO | Admitting: Vascular Surgery
